# Patient Record
Sex: Female | Born: 1935
Health system: Southern US, Community
[De-identification: ages and names within clinical notes are randomized; demographics above are authoritative.]

## PROBLEM LIST (undated history)

## (undated) DIAGNOSIS — R0600 Dyspnea, unspecified: Secondary | ICD-10-CM

## (undated) DIAGNOSIS — I499 Cardiac arrhythmia, unspecified: Secondary | ICD-10-CM

## (undated) DIAGNOSIS — Z9289 Personal history of other medical treatment: Secondary | ICD-10-CM

## (undated) DIAGNOSIS — B019 Varicella without complication: Secondary | ICD-10-CM

## (undated) DIAGNOSIS — J189 Pneumonia, unspecified organism: Secondary | ICD-10-CM

## (undated) DIAGNOSIS — E7800 Pure hypercholesterolemia, unspecified: Secondary | ICD-10-CM

## (undated) DIAGNOSIS — M1990 Unspecified osteoarthritis, unspecified site: Secondary | ICD-10-CM

## (undated) DIAGNOSIS — I10 Essential (primary) hypertension: Secondary | ICD-10-CM

## (undated) DIAGNOSIS — I739 Peripheral vascular disease, unspecified: Secondary | ICD-10-CM

## (undated) DIAGNOSIS — K219 Gastro-esophageal reflux disease without esophagitis: Secondary | ICD-10-CM

## (undated) DIAGNOSIS — T7840XA Allergy, unspecified, initial encounter: Secondary | ICD-10-CM

## (undated) DIAGNOSIS — J449 Chronic obstructive pulmonary disease, unspecified: Secondary | ICD-10-CM

## (undated) HISTORY — PX: EYE SURGERY: SHX253

## (undated) HISTORY — DX: Essential (primary) hypertension: I10

## (undated) HISTORY — PX: OTHER SURGICAL HISTORY: SHX169

## (undated) HISTORY — PX: BIOPSY THYROID: PRO38

## (undated) HISTORY — PX: CATARACT EXTRACTION W/ INTRAOCULAR LENS  IMPLANT, BILATERAL: SHX1307

## (undated) HISTORY — PX: VAGINAL HYSTERECTOMY: SUR661

## (undated) HISTORY — DX: Varicella without complication: B01.9

## (undated) HISTORY — DX: Unspecified osteoarthritis, unspecified site: M19.90

## (undated) HISTORY — PX: VEIN BYPASS SURGERY: SHX833

## (undated) HISTORY — DX: Allergy, unspecified, initial encounter: T78.40XA

---

## 2013-04-23 LAB — CBC AND DIFFERENTIAL
HCT: 40 % (ref 36–46)
HEMOGLOBIN: 13.3 g/dL (ref 12.0–16.0)
PLATELETS: 254 10*3/uL (ref 150–399)
WBC: 5.4 10*3/mL

## 2013-04-23 LAB — TSH: TSH: 4.63 u[IU]/mL (ref 0.41–5.90)

## 2013-04-23 LAB — HEPATIC FUNCTION PANEL
ALK PHOS: 66 U/L (ref 25–125)
ALT: 10 U/L (ref 7–35)
AST: 22 U/L (ref 13–35)
Bilirubin, Total: 0.5 mg/dL

## 2013-04-23 LAB — LIPID PANEL
CHOLESTEROL: 198 mg/dL (ref 0–200)
HDL: 62 mg/dL (ref 35–70)
LDL Cholesterol: 115 mg/dL
Triglycerides: 105 mg/dL (ref 40–160)

## 2013-04-23 LAB — BASIC METABOLIC PANEL
BUN: 13 mg/dL (ref 4–21)
Creatinine: 0.8 mg/dL (ref 0.5–1.1)
Glucose: 77 mg/dL
Potassium: 3.6 mmol/L (ref 3.4–5.3)
Sodium: 141 mmol/L (ref 137–147)

## 2015-12-10 DIAGNOSIS — Z9289 Personal history of other medical treatment: Secondary | ICD-10-CM

## 2015-12-10 HISTORY — PX: HEMATOMA EVACUATION: SHX5118

## 2015-12-10 HISTORY — DX: Personal history of other medical treatment: Z92.89

## 2015-12-20 DIAGNOSIS — I70213 Atherosclerosis of native arteries of extremities with intermittent claudication, bilateral legs: Secondary | ICD-10-CM | POA: Diagnosis not present

## 2015-12-20 DIAGNOSIS — Z01812 Encounter for preprocedural laboratory examination: Secondary | ICD-10-CM | POA: Diagnosis not present

## 2015-12-20 DIAGNOSIS — R0989 Other specified symptoms and signs involving the circulatory and respiratory systems: Secondary | ICD-10-CM | POA: Diagnosis not present

## 2015-12-20 DIAGNOSIS — Z87891 Personal history of nicotine dependence: Secondary | ICD-10-CM | POA: Diagnosis not present

## 2015-12-20 DIAGNOSIS — Z7901 Long term (current) use of anticoagulants: Secondary | ICD-10-CM | POA: Diagnosis not present

## 2015-12-22 DIAGNOSIS — I70213 Atherosclerosis of native arteries of extremities with intermittent claudication, bilateral legs: Secondary | ICD-10-CM | POA: Diagnosis not present

## 2015-12-22 DIAGNOSIS — I10 Essential (primary) hypertension: Secondary | ICD-10-CM | POA: Diagnosis not present

## 2015-12-22 DIAGNOSIS — J449 Chronic obstructive pulmonary disease, unspecified: Secondary | ICD-10-CM | POA: Diagnosis not present

## 2015-12-27 DIAGNOSIS — J449 Chronic obstructive pulmonary disease, unspecified: Secondary | ICD-10-CM | POA: Diagnosis not present

## 2015-12-27 DIAGNOSIS — J0100 Acute maxillary sinusitis, unspecified: Secondary | ICD-10-CM | POA: Diagnosis not present

## 2015-12-27 DIAGNOSIS — I739 Peripheral vascular disease, unspecified: Secondary | ICD-10-CM | POA: Diagnosis not present

## 2016-01-02 DIAGNOSIS — H353131 Nonexudative age-related macular degeneration, bilateral, early dry stage: Secondary | ICD-10-CM | POA: Diagnosis not present

## 2016-01-02 DIAGNOSIS — H25813 Combined forms of age-related cataract, bilateral: Secondary | ICD-10-CM | POA: Diagnosis not present

## 2016-01-07 DIAGNOSIS — I5032 Chronic diastolic (congestive) heart failure: Secondary | ICD-10-CM | POA: Diagnosis not present

## 2016-01-07 DIAGNOSIS — Z9981 Dependence on supplemental oxygen: Secondary | ICD-10-CM | POA: Diagnosis not present

## 2016-01-07 DIAGNOSIS — I110 Hypertensive heart disease with heart failure: Secondary | ICD-10-CM | POA: Diagnosis not present

## 2016-01-07 DIAGNOSIS — Z87891 Personal history of nicotine dependence: Secondary | ICD-10-CM | POA: Diagnosis not present

## 2016-01-07 DIAGNOSIS — J1000 Influenza due to other identified influenza virus with unspecified type of pneumonia: Secondary | ICD-10-CM | POA: Diagnosis not present

## 2016-01-07 DIAGNOSIS — I739 Peripheral vascular disease, unspecified: Secondary | ICD-10-CM | POA: Diagnosis not present

## 2016-01-07 DIAGNOSIS — N390 Urinary tract infection, site not specified: Secondary | ICD-10-CM | POA: Diagnosis not present

## 2016-01-07 DIAGNOSIS — J441 Chronic obstructive pulmonary disease with (acute) exacerbation: Secondary | ICD-10-CM | POA: Diagnosis not present

## 2016-01-10 DIAGNOSIS — M6281 Muscle weakness (generalized): Secondary | ICD-10-CM | POA: Diagnosis not present

## 2016-01-10 DIAGNOSIS — I5032 Chronic diastolic (congestive) heart failure: Secondary | ICD-10-CM | POA: Diagnosis present

## 2016-01-10 DIAGNOSIS — I10 Essential (primary) hypertension: Secondary | ICD-10-CM | POA: Diagnosis not present

## 2016-01-10 DIAGNOSIS — J09X1 Influenza due to identified novel influenza A virus with pneumonia: Secondary | ICD-10-CM | POA: Diagnosis not present

## 2016-01-10 DIAGNOSIS — J441 Chronic obstructive pulmonary disease with (acute) exacerbation: Secondary | ICD-10-CM | POA: Diagnosis present

## 2016-01-10 DIAGNOSIS — R488 Other symbolic dysfunctions: Secondary | ICD-10-CM | POA: Diagnosis not present

## 2016-01-10 DIAGNOSIS — J111 Influenza due to unidentified influenza virus with other respiratory manifestations: Secondary | ICD-10-CM | POA: Diagnosis not present

## 2016-01-10 DIAGNOSIS — Z9981 Dependence on supplemental oxygen: Secondary | ICD-10-CM | POA: Diagnosis not present

## 2016-01-10 DIAGNOSIS — I5031 Acute diastolic (congestive) heart failure: Secondary | ICD-10-CM | POA: Diagnosis not present

## 2016-01-10 DIAGNOSIS — J189 Pneumonia, unspecified organism: Secondary | ICD-10-CM | POA: Diagnosis not present

## 2016-01-10 DIAGNOSIS — J449 Chronic obstructive pulmonary disease, unspecified: Secondary | ICD-10-CM | POA: Diagnosis not present

## 2016-01-10 DIAGNOSIS — I739 Peripheral vascular disease, unspecified: Secondary | ICD-10-CM | POA: Diagnosis present

## 2016-01-10 DIAGNOSIS — Z87891 Personal history of nicotine dependence: Secondary | ICD-10-CM | POA: Diagnosis not present

## 2016-01-10 DIAGNOSIS — R1312 Dysphagia, oropharyngeal phase: Secondary | ICD-10-CM | POA: Diagnosis not present

## 2016-01-10 DIAGNOSIS — J1000 Influenza due to other identified influenza virus with unspecified type of pneumonia: Secondary | ICD-10-CM | POA: Diagnosis present

## 2016-01-10 DIAGNOSIS — R2681 Unsteadiness on feet: Secondary | ICD-10-CM | POA: Diagnosis not present

## 2016-01-10 DIAGNOSIS — J40 Bronchitis, not specified as acute or chronic: Secondary | ICD-10-CM | POA: Diagnosis not present

## 2016-01-10 DIAGNOSIS — I110 Hypertensive heart disease with heart failure: Secondary | ICD-10-CM | POA: Diagnosis present

## 2016-01-10 DIAGNOSIS — R0602 Shortness of breath: Secondary | ICD-10-CM | POA: Diagnosis not present

## 2016-01-10 DIAGNOSIS — R41841 Cognitive communication deficit: Secondary | ICD-10-CM | POA: Diagnosis not present

## 2016-01-13 DIAGNOSIS — M6281 Muscle weakness (generalized): Secondary | ICD-10-CM | POA: Diagnosis not present

## 2016-01-13 DIAGNOSIS — R0602 Shortness of breath: Secondary | ICD-10-CM | POA: Diagnosis not present

## 2016-01-13 DIAGNOSIS — R41841 Cognitive communication deficit: Secondary | ICD-10-CM | POA: Diagnosis not present

## 2016-01-13 DIAGNOSIS — R1312 Dysphagia, oropharyngeal phase: Secondary | ICD-10-CM | POA: Diagnosis not present

## 2016-01-13 DIAGNOSIS — R2681 Unsteadiness on feet: Secondary | ICD-10-CM | POA: Diagnosis not present

## 2016-01-13 DIAGNOSIS — I10 Essential (primary) hypertension: Secondary | ICD-10-CM | POA: Diagnosis not present

## 2016-01-13 DIAGNOSIS — R488 Other symbolic dysfunctions: Secondary | ICD-10-CM | POA: Diagnosis not present

## 2016-01-13 DIAGNOSIS — J189 Pneumonia, unspecified organism: Secondary | ICD-10-CM | POA: Diagnosis not present

## 2016-01-13 DIAGNOSIS — I70211 Atherosclerosis of native arteries of extremities with intermittent claudication, right leg: Secondary | ICD-10-CM | POA: Diagnosis not present

## 2016-01-13 DIAGNOSIS — J449 Chronic obstructive pulmonary disease, unspecified: Secondary | ICD-10-CM | POA: Diagnosis not present

## 2016-01-13 DIAGNOSIS — J441 Chronic obstructive pulmonary disease with (acute) exacerbation: Secondary | ICD-10-CM | POA: Diagnosis not present

## 2016-01-13 DIAGNOSIS — I5031 Acute diastolic (congestive) heart failure: Secondary | ICD-10-CM | POA: Diagnosis not present

## 2016-01-13 DIAGNOSIS — J09X1 Influenza due to identified novel influenza A virus with pneumonia: Secondary | ICD-10-CM | POA: Diagnosis not present

## 2016-01-13 DIAGNOSIS — I739 Peripheral vascular disease, unspecified: Secondary | ICD-10-CM | POA: Diagnosis not present

## 2016-01-29 DIAGNOSIS — I70211 Atherosclerosis of native arteries of extremities with intermittent claudication, right leg: Secondary | ICD-10-CM | POA: Diagnosis not present

## 2016-02-02 DIAGNOSIS — D62 Acute posthemorrhagic anemia: Secondary | ICD-10-CM | POA: Diagnosis not present

## 2016-02-02 DIAGNOSIS — I70212 Atherosclerosis of native arteries of extremities with intermittent claudication, left leg: Secondary | ICD-10-CM | POA: Diagnosis not present

## 2016-02-02 DIAGNOSIS — I7091 Generalized atherosclerosis: Secondary | ICD-10-CM | POA: Diagnosis not present

## 2016-02-02 DIAGNOSIS — I70291 Other atherosclerosis of native arteries of extremities, right leg: Secondary | ICD-10-CM | POA: Diagnosis not present

## 2016-02-02 DIAGNOSIS — D649 Anemia, unspecified: Secondary | ICD-10-CM | POA: Diagnosis not present

## 2016-02-02 DIAGNOSIS — I739 Peripheral vascular disease, unspecified: Secondary | ICD-10-CM | POA: Diagnosis not present

## 2016-02-02 DIAGNOSIS — I998 Other disorder of circulatory system: Secondary | ICD-10-CM | POA: Diagnosis not present

## 2016-02-02 DIAGNOSIS — I4891 Unspecified atrial fibrillation: Secondary | ICD-10-CM | POA: Diagnosis not present

## 2016-02-02 DIAGNOSIS — D72829 Elevated white blood cell count, unspecified: Secondary | ICD-10-CM | POA: Diagnosis not present

## 2016-02-02 DIAGNOSIS — I70221 Atherosclerosis of native arteries of extremities with rest pain, right leg: Secondary | ICD-10-CM | POA: Diagnosis not present

## 2016-02-02 DIAGNOSIS — T82868A Thrombosis of vascular prosthetic devices, implants and grafts, initial encounter: Secondary | ICD-10-CM | POA: Diagnosis present

## 2016-02-02 DIAGNOSIS — J449 Chronic obstructive pulmonary disease, unspecified: Secondary | ICD-10-CM | POA: Diagnosis not present

## 2016-02-02 DIAGNOSIS — I700 Atherosclerosis of aorta: Secondary | ICD-10-CM | POA: Diagnosis not present

## 2016-02-02 DIAGNOSIS — I272 Other secondary pulmonary hypertension: Secondary | ICD-10-CM | POA: Diagnosis not present

## 2016-02-02 DIAGNOSIS — I70211 Atherosclerosis of native arteries of extremities with intermittent claudication, right leg: Secondary | ICD-10-CM | POA: Diagnosis not present

## 2016-02-02 DIAGNOSIS — Z87891 Personal history of nicotine dependence: Secondary | ICD-10-CM | POA: Diagnosis not present

## 2016-02-02 DIAGNOSIS — I071 Rheumatic tricuspid insufficiency: Secondary | ICD-10-CM | POA: Diagnosis not present

## 2016-02-02 DIAGNOSIS — I10 Essential (primary) hypertension: Secondary | ICD-10-CM | POA: Diagnosis not present

## 2016-02-02 DIAGNOSIS — R339 Retention of urine, unspecified: Secondary | ICD-10-CM | POA: Diagnosis not present

## 2016-02-02 DIAGNOSIS — I9752 Accidental puncture and laceration of a circulatory system organ or structure during other procedure: Secondary | ICD-10-CM | POA: Diagnosis present

## 2016-02-02 DIAGNOSIS — T82898A Other specified complication of vascular prosthetic devices, implants and grafts, initial encounter: Secondary | ICD-10-CM | POA: Diagnosis not present

## 2016-02-09 DIAGNOSIS — Z9981 Dependence on supplemental oxygen: Secondary | ICD-10-CM | POA: Diagnosis not present

## 2016-02-09 DIAGNOSIS — I739 Peripheral vascular disease, unspecified: Secondary | ICD-10-CM | POA: Diagnosis not present

## 2016-02-09 DIAGNOSIS — Z48812 Encounter for surgical aftercare following surgery on the circulatory system: Secondary | ICD-10-CM | POA: Diagnosis not present

## 2016-02-09 DIAGNOSIS — I10 Essential (primary) hypertension: Secondary | ICD-10-CM | POA: Diagnosis not present

## 2016-02-09 DIAGNOSIS — F17201 Nicotine dependence, unspecified, in remission: Secondary | ICD-10-CM | POA: Diagnosis not present

## 2016-02-09 DIAGNOSIS — J449 Chronic obstructive pulmonary disease, unspecified: Secondary | ICD-10-CM | POA: Diagnosis not present

## 2016-02-09 DIAGNOSIS — I4891 Unspecified atrial fibrillation: Secondary | ICD-10-CM | POA: Diagnosis not present

## 2016-02-09 DIAGNOSIS — R1310 Dysphagia, unspecified: Secondary | ICD-10-CM | POA: Diagnosis not present

## 2016-02-12 DIAGNOSIS — I739 Peripheral vascular disease, unspecified: Secondary | ICD-10-CM | POA: Diagnosis not present

## 2016-02-12 DIAGNOSIS — I70211 Atherosclerosis of native arteries of extremities with intermittent claudication, right leg: Secondary | ICD-10-CM | POA: Diagnosis not present

## 2016-02-12 DIAGNOSIS — I4891 Unspecified atrial fibrillation: Secondary | ICD-10-CM | POA: Diagnosis not present

## 2016-02-12 DIAGNOSIS — Z48812 Encounter for surgical aftercare following surgery on the circulatory system: Secondary | ICD-10-CM | POA: Diagnosis not present

## 2016-02-12 DIAGNOSIS — I10 Essential (primary) hypertension: Secondary | ICD-10-CM | POA: Diagnosis not present

## 2016-02-12 DIAGNOSIS — J449 Chronic obstructive pulmonary disease, unspecified: Secondary | ICD-10-CM | POA: Diagnosis not present

## 2016-02-12 DIAGNOSIS — R1310 Dysphagia, unspecified: Secondary | ICD-10-CM | POA: Diagnosis not present

## 2016-02-13 DIAGNOSIS — R1310 Dysphagia, unspecified: Secondary | ICD-10-CM | POA: Diagnosis not present

## 2016-02-13 DIAGNOSIS — I4891 Unspecified atrial fibrillation: Secondary | ICD-10-CM | POA: Diagnosis not present

## 2016-02-13 DIAGNOSIS — I10 Essential (primary) hypertension: Secondary | ICD-10-CM | POA: Diagnosis not present

## 2016-02-13 DIAGNOSIS — Z48812 Encounter for surgical aftercare following surgery on the circulatory system: Secondary | ICD-10-CM | POA: Diagnosis not present

## 2016-02-13 DIAGNOSIS — J449 Chronic obstructive pulmonary disease, unspecified: Secondary | ICD-10-CM | POA: Diagnosis not present

## 2016-02-13 DIAGNOSIS — I739 Peripheral vascular disease, unspecified: Secondary | ICD-10-CM | POA: Diagnosis not present

## 2016-02-14 DIAGNOSIS — H25813 Combined forms of age-related cataract, bilateral: Secondary | ICD-10-CM | POA: Diagnosis not present

## 2016-02-15 DIAGNOSIS — J449 Chronic obstructive pulmonary disease, unspecified: Secondary | ICD-10-CM | POA: Diagnosis not present

## 2016-02-15 DIAGNOSIS — I4891 Unspecified atrial fibrillation: Secondary | ICD-10-CM | POA: Diagnosis not present

## 2016-02-15 DIAGNOSIS — I739 Peripheral vascular disease, unspecified: Secondary | ICD-10-CM | POA: Diagnosis not present

## 2016-02-15 DIAGNOSIS — Z48812 Encounter for surgical aftercare following surgery on the circulatory system: Secondary | ICD-10-CM | POA: Diagnosis not present

## 2016-02-15 DIAGNOSIS — I10 Essential (primary) hypertension: Secondary | ICD-10-CM | POA: Diagnosis not present

## 2016-02-15 DIAGNOSIS — R1310 Dysphagia, unspecified: Secondary | ICD-10-CM | POA: Diagnosis not present

## 2016-02-19 DIAGNOSIS — I10 Essential (primary) hypertension: Secondary | ICD-10-CM | POA: Diagnosis not present

## 2016-02-19 DIAGNOSIS — R1310 Dysphagia, unspecified: Secondary | ICD-10-CM | POA: Diagnosis not present

## 2016-02-19 DIAGNOSIS — I739 Peripheral vascular disease, unspecified: Secondary | ICD-10-CM | POA: Diagnosis not present

## 2016-02-19 DIAGNOSIS — J449 Chronic obstructive pulmonary disease, unspecified: Secondary | ICD-10-CM | POA: Diagnosis not present

## 2016-02-19 DIAGNOSIS — I4891 Unspecified atrial fibrillation: Secondary | ICD-10-CM | POA: Diagnosis not present

## 2016-02-19 DIAGNOSIS — Z48812 Encounter for surgical aftercare following surgery on the circulatory system: Secondary | ICD-10-CM | POA: Diagnosis not present

## 2016-02-20 DIAGNOSIS — R1310 Dysphagia, unspecified: Secondary | ICD-10-CM | POA: Diagnosis not present

## 2016-02-20 DIAGNOSIS — I10 Essential (primary) hypertension: Secondary | ICD-10-CM | POA: Diagnosis not present

## 2016-02-20 DIAGNOSIS — I4891 Unspecified atrial fibrillation: Secondary | ICD-10-CM | POA: Diagnosis not present

## 2016-02-20 DIAGNOSIS — I739 Peripheral vascular disease, unspecified: Secondary | ICD-10-CM | POA: Diagnosis not present

## 2016-02-20 DIAGNOSIS — Z48812 Encounter for surgical aftercare following surgery on the circulatory system: Secondary | ICD-10-CM | POA: Diagnosis not present

## 2016-02-20 DIAGNOSIS — J449 Chronic obstructive pulmonary disease, unspecified: Secondary | ICD-10-CM | POA: Diagnosis not present

## 2016-02-21 DIAGNOSIS — R1310 Dysphagia, unspecified: Secondary | ICD-10-CM | POA: Diagnosis not present

## 2016-02-21 DIAGNOSIS — I10 Essential (primary) hypertension: Secondary | ICD-10-CM | POA: Diagnosis not present

## 2016-02-21 DIAGNOSIS — Z48812 Encounter for surgical aftercare following surgery on the circulatory system: Secondary | ICD-10-CM | POA: Diagnosis not present

## 2016-02-21 DIAGNOSIS — I4891 Unspecified atrial fibrillation: Secondary | ICD-10-CM | POA: Diagnosis not present

## 2016-02-21 DIAGNOSIS — J449 Chronic obstructive pulmonary disease, unspecified: Secondary | ICD-10-CM | POA: Diagnosis not present

## 2016-02-21 DIAGNOSIS — I739 Peripheral vascular disease, unspecified: Secondary | ICD-10-CM | POA: Diagnosis not present

## 2016-02-22 DIAGNOSIS — I739 Peripheral vascular disease, unspecified: Secondary | ICD-10-CM | POA: Diagnosis not present

## 2016-02-22 DIAGNOSIS — I4891 Unspecified atrial fibrillation: Secondary | ICD-10-CM | POA: Diagnosis not present

## 2016-02-22 DIAGNOSIS — R1310 Dysphagia, unspecified: Secondary | ICD-10-CM | POA: Diagnosis not present

## 2016-02-22 DIAGNOSIS — J449 Chronic obstructive pulmonary disease, unspecified: Secondary | ICD-10-CM | POA: Diagnosis not present

## 2016-02-22 DIAGNOSIS — Z48812 Encounter for surgical aftercare following surgery on the circulatory system: Secondary | ICD-10-CM | POA: Diagnosis not present

## 2016-02-22 DIAGNOSIS — I10 Essential (primary) hypertension: Secondary | ICD-10-CM | POA: Diagnosis not present

## 2016-02-23 DIAGNOSIS — J449 Chronic obstructive pulmonary disease, unspecified: Secondary | ICD-10-CM | POA: Diagnosis not present

## 2016-02-23 DIAGNOSIS — I739 Peripheral vascular disease, unspecified: Secondary | ICD-10-CM | POA: Diagnosis not present

## 2016-02-23 DIAGNOSIS — I4891 Unspecified atrial fibrillation: Secondary | ICD-10-CM | POA: Diagnosis not present

## 2016-02-23 DIAGNOSIS — Z48812 Encounter for surgical aftercare following surgery on the circulatory system: Secondary | ICD-10-CM | POA: Diagnosis not present

## 2016-02-23 DIAGNOSIS — R1310 Dysphagia, unspecified: Secondary | ICD-10-CM | POA: Diagnosis not present

## 2016-02-23 DIAGNOSIS — I10 Essential (primary) hypertension: Secondary | ICD-10-CM | POA: Diagnosis not present

## 2016-02-23 DIAGNOSIS — J0100 Acute maxillary sinusitis, unspecified: Secondary | ICD-10-CM | POA: Diagnosis not present

## 2016-02-27 DIAGNOSIS — I10 Essential (primary) hypertension: Secondary | ICD-10-CM | POA: Diagnosis not present

## 2016-02-27 DIAGNOSIS — I4891 Unspecified atrial fibrillation: Secondary | ICD-10-CM | POA: Diagnosis not present

## 2016-02-27 DIAGNOSIS — Z48812 Encounter for surgical aftercare following surgery on the circulatory system: Secondary | ICD-10-CM | POA: Diagnosis not present

## 2016-02-27 DIAGNOSIS — J449 Chronic obstructive pulmonary disease, unspecified: Secondary | ICD-10-CM | POA: Diagnosis not present

## 2016-02-27 DIAGNOSIS — R1310 Dysphagia, unspecified: Secondary | ICD-10-CM | POA: Diagnosis not present

## 2016-02-27 DIAGNOSIS — I739 Peripheral vascular disease, unspecified: Secondary | ICD-10-CM | POA: Diagnosis not present

## 2016-02-29 DIAGNOSIS — I10 Essential (primary) hypertension: Secondary | ICD-10-CM | POA: Diagnosis not present

## 2016-02-29 DIAGNOSIS — Z48812 Encounter for surgical aftercare following surgery on the circulatory system: Secondary | ICD-10-CM | POA: Diagnosis not present

## 2016-02-29 DIAGNOSIS — R1310 Dysphagia, unspecified: Secondary | ICD-10-CM | POA: Diagnosis not present

## 2016-02-29 DIAGNOSIS — I4891 Unspecified atrial fibrillation: Secondary | ICD-10-CM | POA: Diagnosis not present

## 2016-02-29 DIAGNOSIS — I739 Peripheral vascular disease, unspecified: Secondary | ICD-10-CM | POA: Diagnosis not present

## 2016-02-29 DIAGNOSIS — J449 Chronic obstructive pulmonary disease, unspecified: Secondary | ICD-10-CM | POA: Diagnosis not present

## 2016-03-01 DIAGNOSIS — I70221 Atherosclerosis of native arteries of extremities with rest pain, right leg: Secondary | ICD-10-CM | POA: Diagnosis not present

## 2016-03-01 DIAGNOSIS — Z87891 Personal history of nicotine dependence: Secondary | ICD-10-CM | POA: Diagnosis not present

## 2016-03-26 DIAGNOSIS — Z7901 Long term (current) use of anticoagulants: Secondary | ICD-10-CM | POA: Diagnosis not present

## 2016-03-26 DIAGNOSIS — Z01812 Encounter for preprocedural laboratory examination: Secondary | ICD-10-CM | POA: Diagnosis not present

## 2016-03-26 DIAGNOSIS — I739 Peripheral vascular disease, unspecified: Secondary | ICD-10-CM | POA: Diagnosis not present

## 2016-04-05 DIAGNOSIS — J449 Chronic obstructive pulmonary disease, unspecified: Secondary | ICD-10-CM | POA: Diagnosis not present

## 2016-04-05 DIAGNOSIS — I70213 Atherosclerosis of native arteries of extremities with intermittent claudication, bilateral legs: Secondary | ICD-10-CM | POA: Diagnosis not present

## 2016-04-11 DIAGNOSIS — J0100 Acute maxillary sinusitis, unspecified: Secondary | ICD-10-CM | POA: Diagnosis not present

## 2016-04-11 DIAGNOSIS — I70223 Atherosclerosis of native arteries of extremities with rest pain, bilateral legs: Secondary | ICD-10-CM | POA: Diagnosis not present

## 2016-04-11 DIAGNOSIS — Z87891 Personal history of nicotine dependence: Secondary | ICD-10-CM | POA: Diagnosis not present

## 2016-04-16 DIAGNOSIS — I70213 Atherosclerosis of native arteries of extremities with intermittent claudication, bilateral legs: Secondary | ICD-10-CM | POA: Diagnosis not present

## 2016-04-16 DIAGNOSIS — Z7982 Long term (current) use of aspirin: Secondary | ICD-10-CM | POA: Diagnosis not present

## 2016-04-16 DIAGNOSIS — Z7901 Long term (current) use of anticoagulants: Secondary | ICD-10-CM | POA: Diagnosis not present

## 2016-04-26 DIAGNOSIS — I70203 Unspecified atherosclerosis of native arteries of extremities, bilateral legs: Secondary | ICD-10-CM | POA: Diagnosis not present

## 2016-04-26 DIAGNOSIS — R262 Difficulty in walking, not elsewhere classified: Secondary | ICD-10-CM | POA: Diagnosis not present

## 2016-04-26 DIAGNOSIS — B353 Tinea pedis: Secondary | ICD-10-CM | POA: Diagnosis not present

## 2016-04-26 DIAGNOSIS — B351 Tinea unguium: Secondary | ICD-10-CM | POA: Diagnosis not present

## 2016-05-03 DIAGNOSIS — I70213 Atherosclerosis of native arteries of extremities with intermittent claudication, bilateral legs: Secondary | ICD-10-CM | POA: Diagnosis not present

## 2016-05-03 DIAGNOSIS — I739 Peripheral vascular disease, unspecified: Secondary | ICD-10-CM | POA: Diagnosis not present

## 2016-05-03 DIAGNOSIS — I10 Essential (primary) hypertension: Secondary | ICD-10-CM | POA: Diagnosis not present

## 2016-05-16 DIAGNOSIS — J449 Chronic obstructive pulmonary disease, unspecified: Secondary | ICD-10-CM | POA: Diagnosis not present

## 2016-05-17 DIAGNOSIS — Z87891 Personal history of nicotine dependence: Secondary | ICD-10-CM | POA: Diagnosis not present

## 2016-05-17 DIAGNOSIS — I7092 Chronic total occlusion of artery of the extremities: Secondary | ICD-10-CM | POA: Diagnosis not present

## 2016-05-17 DIAGNOSIS — Z01818 Encounter for other preprocedural examination: Secondary | ICD-10-CM | POA: Diagnosis not present

## 2016-05-17 DIAGNOSIS — I10 Essential (primary) hypertension: Secondary | ICD-10-CM | POA: Diagnosis not present

## 2016-05-17 DIAGNOSIS — I70221 Atherosclerosis of native arteries of extremities with rest pain, right leg: Secondary | ICD-10-CM | POA: Diagnosis not present

## 2016-05-30 DIAGNOSIS — I739 Peripheral vascular disease, unspecified: Secondary | ICD-10-CM | POA: Diagnosis not present

## 2016-05-30 DIAGNOSIS — I70222 Atherosclerosis of native arteries of extremities with rest pain, left leg: Secondary | ICD-10-CM | POA: Diagnosis not present

## 2016-05-30 DIAGNOSIS — I998 Other disorder of circulatory system: Secondary | ICD-10-CM | POA: Diagnosis present

## 2016-05-30 DIAGNOSIS — Z87891 Personal history of nicotine dependence: Secondary | ICD-10-CM | POA: Diagnosis not present

## 2016-05-30 DIAGNOSIS — I10 Essential (primary) hypertension: Secondary | ICD-10-CM | POA: Diagnosis not present

## 2016-05-30 DIAGNOSIS — I70221 Atherosclerosis of native arteries of extremities with rest pain, right leg: Secondary | ICD-10-CM | POA: Diagnosis not present

## 2016-05-30 DIAGNOSIS — J45909 Unspecified asthma, uncomplicated: Secondary | ICD-10-CM | POA: Diagnosis present

## 2016-06-04 DIAGNOSIS — Z48812 Encounter for surgical aftercare following surgery on the circulatory system: Secondary | ICD-10-CM | POA: Diagnosis not present

## 2016-06-04 DIAGNOSIS — I10 Essential (primary) hypertension: Secondary | ICD-10-CM | POA: Diagnosis not present

## 2016-06-04 DIAGNOSIS — I70221 Atherosclerosis of native arteries of extremities with rest pain, right leg: Secondary | ICD-10-CM | POA: Diagnosis not present

## 2016-06-04 DIAGNOSIS — Z9981 Dependence on supplemental oxygen: Secondary | ICD-10-CM | POA: Diagnosis not present

## 2016-06-04 DIAGNOSIS — F17201 Nicotine dependence, unspecified, in remission: Secondary | ICD-10-CM | POA: Diagnosis not present

## 2016-06-05 DIAGNOSIS — I70221 Atherosclerosis of native arteries of extremities with rest pain, right leg: Secondary | ICD-10-CM | POA: Diagnosis not present

## 2016-06-05 DIAGNOSIS — Z48812 Encounter for surgical aftercare following surgery on the circulatory system: Secondary | ICD-10-CM | POA: Diagnosis not present

## 2016-06-05 DIAGNOSIS — F17201 Nicotine dependence, unspecified, in remission: Secondary | ICD-10-CM | POA: Diagnosis not present

## 2016-06-05 DIAGNOSIS — I10 Essential (primary) hypertension: Secondary | ICD-10-CM | POA: Diagnosis not present

## 2016-06-05 DIAGNOSIS — Z9981 Dependence on supplemental oxygen: Secondary | ICD-10-CM | POA: Diagnosis not present

## 2016-06-07 DIAGNOSIS — Z9981 Dependence on supplemental oxygen: Secondary | ICD-10-CM | POA: Diagnosis not present

## 2016-06-07 DIAGNOSIS — Z48812 Encounter for surgical aftercare following surgery on the circulatory system: Secondary | ICD-10-CM | POA: Diagnosis not present

## 2016-06-07 DIAGNOSIS — F17201 Nicotine dependence, unspecified, in remission: Secondary | ICD-10-CM | POA: Diagnosis not present

## 2016-06-07 DIAGNOSIS — I70221 Atherosclerosis of native arteries of extremities with rest pain, right leg: Secondary | ICD-10-CM | POA: Diagnosis not present

## 2016-06-07 DIAGNOSIS — I10 Essential (primary) hypertension: Secondary | ICD-10-CM | POA: Diagnosis not present

## 2016-06-10 DIAGNOSIS — Z48812 Encounter for surgical aftercare following surgery on the circulatory system: Secondary | ICD-10-CM | POA: Diagnosis not present

## 2016-06-10 DIAGNOSIS — I10 Essential (primary) hypertension: Secondary | ICD-10-CM | POA: Diagnosis not present

## 2016-06-10 DIAGNOSIS — Z9981 Dependence on supplemental oxygen: Secondary | ICD-10-CM | POA: Diagnosis not present

## 2016-06-10 DIAGNOSIS — F17201 Nicotine dependence, unspecified, in remission: Secondary | ICD-10-CM | POA: Diagnosis not present

## 2016-06-10 DIAGNOSIS — I70221 Atherosclerosis of native arteries of extremities with rest pain, right leg: Secondary | ICD-10-CM | POA: Diagnosis not present

## 2016-06-12 DIAGNOSIS — Z48812 Encounter for surgical aftercare following surgery on the circulatory system: Secondary | ICD-10-CM | POA: Diagnosis not present

## 2016-06-12 DIAGNOSIS — Z9981 Dependence on supplemental oxygen: Secondary | ICD-10-CM | POA: Diagnosis not present

## 2016-06-12 DIAGNOSIS — I10 Essential (primary) hypertension: Secondary | ICD-10-CM | POA: Diagnosis not present

## 2016-06-12 DIAGNOSIS — I70221 Atherosclerosis of native arteries of extremities with rest pain, right leg: Secondary | ICD-10-CM | POA: Diagnosis not present

## 2016-06-12 DIAGNOSIS — F17201 Nicotine dependence, unspecified, in remission: Secondary | ICD-10-CM | POA: Diagnosis not present

## 2016-06-13 DIAGNOSIS — I70221 Atherosclerosis of native arteries of extremities with rest pain, right leg: Secondary | ICD-10-CM | POA: Diagnosis not present

## 2016-06-13 DIAGNOSIS — F17201 Nicotine dependence, unspecified, in remission: Secondary | ICD-10-CM | POA: Diagnosis not present

## 2016-06-13 DIAGNOSIS — Z9981 Dependence on supplemental oxygen: Secondary | ICD-10-CM | POA: Diagnosis not present

## 2016-06-13 DIAGNOSIS — Z48812 Encounter for surgical aftercare following surgery on the circulatory system: Secondary | ICD-10-CM | POA: Diagnosis not present

## 2016-06-13 DIAGNOSIS — I10 Essential (primary) hypertension: Secondary | ICD-10-CM | POA: Diagnosis not present

## 2016-06-17 DIAGNOSIS — F17201 Nicotine dependence, unspecified, in remission: Secondary | ICD-10-CM | POA: Diagnosis not present

## 2016-06-17 DIAGNOSIS — Z48812 Encounter for surgical aftercare following surgery on the circulatory system: Secondary | ICD-10-CM | POA: Diagnosis not present

## 2016-06-17 DIAGNOSIS — I70221 Atherosclerosis of native arteries of extremities with rest pain, right leg: Secondary | ICD-10-CM | POA: Diagnosis not present

## 2016-06-17 DIAGNOSIS — I10 Essential (primary) hypertension: Secondary | ICD-10-CM | POA: Diagnosis not present

## 2016-06-17 DIAGNOSIS — Z9981 Dependence on supplemental oxygen: Secondary | ICD-10-CM | POA: Diagnosis not present

## 2016-06-18 DIAGNOSIS — I10 Essential (primary) hypertension: Secondary | ICD-10-CM | POA: Diagnosis not present

## 2016-06-18 DIAGNOSIS — I70221 Atherosclerosis of native arteries of extremities with rest pain, right leg: Secondary | ICD-10-CM | POA: Diagnosis not present

## 2016-06-18 DIAGNOSIS — Z9981 Dependence on supplemental oxygen: Secondary | ICD-10-CM | POA: Diagnosis not present

## 2016-06-18 DIAGNOSIS — F17201 Nicotine dependence, unspecified, in remission: Secondary | ICD-10-CM | POA: Diagnosis not present

## 2016-06-18 DIAGNOSIS — Z48812 Encounter for surgical aftercare following surgery on the circulatory system: Secondary | ICD-10-CM | POA: Diagnosis not present

## 2016-06-21 DIAGNOSIS — Z9981 Dependence on supplemental oxygen: Secondary | ICD-10-CM | POA: Diagnosis not present

## 2016-06-21 DIAGNOSIS — Z48812 Encounter for surgical aftercare following surgery on the circulatory system: Secondary | ICD-10-CM | POA: Diagnosis not present

## 2016-06-21 DIAGNOSIS — I70221 Atherosclerosis of native arteries of extremities with rest pain, right leg: Secondary | ICD-10-CM | POA: Diagnosis not present

## 2016-06-21 DIAGNOSIS — F17201 Nicotine dependence, unspecified, in remission: Secondary | ICD-10-CM | POA: Diagnosis not present

## 2016-06-21 DIAGNOSIS — I10 Essential (primary) hypertension: Secondary | ICD-10-CM | POA: Diagnosis not present

## 2016-06-22 DIAGNOSIS — I10 Essential (primary) hypertension: Secondary | ICD-10-CM | POA: Diagnosis not present

## 2016-06-22 DIAGNOSIS — Z9981 Dependence on supplemental oxygen: Secondary | ICD-10-CM | POA: Diagnosis not present

## 2016-06-22 DIAGNOSIS — F17201 Nicotine dependence, unspecified, in remission: Secondary | ICD-10-CM | POA: Diagnosis not present

## 2016-06-22 DIAGNOSIS — Z48812 Encounter for surgical aftercare following surgery on the circulatory system: Secondary | ICD-10-CM | POA: Diagnosis not present

## 2016-06-22 DIAGNOSIS — I70221 Atherosclerosis of native arteries of extremities with rest pain, right leg: Secondary | ICD-10-CM | POA: Diagnosis not present

## 2016-06-24 DIAGNOSIS — I70221 Atherosclerosis of native arteries of extremities with rest pain, right leg: Secondary | ICD-10-CM | POA: Diagnosis not present

## 2016-06-24 DIAGNOSIS — I10 Essential (primary) hypertension: Secondary | ICD-10-CM | POA: Diagnosis not present

## 2016-06-24 DIAGNOSIS — Z9981 Dependence on supplemental oxygen: Secondary | ICD-10-CM | POA: Diagnosis not present

## 2016-06-24 DIAGNOSIS — F17201 Nicotine dependence, unspecified, in remission: Secondary | ICD-10-CM | POA: Diagnosis not present

## 2016-06-24 DIAGNOSIS — Z48812 Encounter for surgical aftercare following surgery on the circulatory system: Secondary | ICD-10-CM | POA: Diagnosis not present

## 2016-06-25 DIAGNOSIS — I10 Essential (primary) hypertension: Secondary | ICD-10-CM | POA: Diagnosis not present

## 2016-06-25 DIAGNOSIS — Z48812 Encounter for surgical aftercare following surgery on the circulatory system: Secondary | ICD-10-CM | POA: Diagnosis not present

## 2016-06-25 DIAGNOSIS — Z9981 Dependence on supplemental oxygen: Secondary | ICD-10-CM | POA: Diagnosis not present

## 2016-06-25 DIAGNOSIS — F17201 Nicotine dependence, unspecified, in remission: Secondary | ICD-10-CM | POA: Diagnosis not present

## 2016-06-25 DIAGNOSIS — I70221 Atherosclerosis of native arteries of extremities with rest pain, right leg: Secondary | ICD-10-CM | POA: Diagnosis not present

## 2016-06-26 DIAGNOSIS — Z9981 Dependence on supplemental oxygen: Secondary | ICD-10-CM | POA: Diagnosis not present

## 2016-06-26 DIAGNOSIS — F17201 Nicotine dependence, unspecified, in remission: Secondary | ICD-10-CM | POA: Diagnosis not present

## 2016-06-26 DIAGNOSIS — Z48812 Encounter for surgical aftercare following surgery on the circulatory system: Secondary | ICD-10-CM | POA: Diagnosis not present

## 2016-06-26 DIAGNOSIS — I70221 Atherosclerosis of native arteries of extremities with rest pain, right leg: Secondary | ICD-10-CM | POA: Diagnosis not present

## 2016-06-26 DIAGNOSIS — I10 Essential (primary) hypertension: Secondary | ICD-10-CM | POA: Diagnosis not present

## 2016-06-27 DIAGNOSIS — Z48812 Encounter for surgical aftercare following surgery on the circulatory system: Secondary | ICD-10-CM | POA: Diagnosis not present

## 2016-06-27 DIAGNOSIS — I70221 Atherosclerosis of native arteries of extremities with rest pain, right leg: Secondary | ICD-10-CM | POA: Diagnosis not present

## 2016-06-27 DIAGNOSIS — I10 Essential (primary) hypertension: Secondary | ICD-10-CM | POA: Diagnosis not present

## 2016-06-27 DIAGNOSIS — Z9981 Dependence on supplemental oxygen: Secondary | ICD-10-CM | POA: Diagnosis not present

## 2016-06-27 DIAGNOSIS — F17201 Nicotine dependence, unspecified, in remission: Secondary | ICD-10-CM | POA: Diagnosis not present

## 2016-06-28 DIAGNOSIS — F17201 Nicotine dependence, unspecified, in remission: Secondary | ICD-10-CM | POA: Diagnosis not present

## 2016-06-28 DIAGNOSIS — Z9981 Dependence on supplemental oxygen: Secondary | ICD-10-CM | POA: Diagnosis not present

## 2016-06-28 DIAGNOSIS — I10 Essential (primary) hypertension: Secondary | ICD-10-CM | POA: Diagnosis not present

## 2016-06-28 DIAGNOSIS — Z48812 Encounter for surgical aftercare following surgery on the circulatory system: Secondary | ICD-10-CM | POA: Diagnosis not present

## 2016-06-28 DIAGNOSIS — I70221 Atherosclerosis of native arteries of extremities with rest pain, right leg: Secondary | ICD-10-CM | POA: Diagnosis not present

## 2016-07-01 DIAGNOSIS — F17201 Nicotine dependence, unspecified, in remission: Secondary | ICD-10-CM | POA: Diagnosis not present

## 2016-07-01 DIAGNOSIS — Z48812 Encounter for surgical aftercare following surgery on the circulatory system: Secondary | ICD-10-CM | POA: Diagnosis not present

## 2016-07-01 DIAGNOSIS — I70221 Atherosclerosis of native arteries of extremities with rest pain, right leg: Secondary | ICD-10-CM | POA: Diagnosis not present

## 2016-07-01 DIAGNOSIS — Z9981 Dependence on supplemental oxygen: Secondary | ICD-10-CM | POA: Diagnosis not present

## 2016-07-01 DIAGNOSIS — I10 Essential (primary) hypertension: Secondary | ICD-10-CM | POA: Diagnosis not present

## 2016-07-02 ENCOUNTER — Ambulatory Visit: Payer: Self-pay | Admitting: Family Medicine

## 2016-07-02 DIAGNOSIS — I70221 Atherosclerosis of native arteries of extremities with rest pain, right leg: Secondary | ICD-10-CM | POA: Diagnosis not present

## 2016-07-02 DIAGNOSIS — Z48812 Encounter for surgical aftercare following surgery on the circulatory system: Secondary | ICD-10-CM | POA: Diagnosis not present

## 2016-07-02 DIAGNOSIS — I10 Essential (primary) hypertension: Secondary | ICD-10-CM | POA: Diagnosis not present

## 2016-07-02 DIAGNOSIS — Z9981 Dependence on supplemental oxygen: Secondary | ICD-10-CM | POA: Diagnosis not present

## 2016-07-02 DIAGNOSIS — F17201 Nicotine dependence, unspecified, in remission: Secondary | ICD-10-CM | POA: Diagnosis not present

## 2016-07-03 DIAGNOSIS — I70221 Atherosclerosis of native arteries of extremities with rest pain, right leg: Secondary | ICD-10-CM | POA: Diagnosis not present

## 2016-07-03 DIAGNOSIS — Z9981 Dependence on supplemental oxygen: Secondary | ICD-10-CM | POA: Diagnosis not present

## 2016-07-03 DIAGNOSIS — Z48812 Encounter for surgical aftercare following surgery on the circulatory system: Secondary | ICD-10-CM | POA: Diagnosis not present

## 2016-07-03 DIAGNOSIS — I10 Essential (primary) hypertension: Secondary | ICD-10-CM | POA: Diagnosis not present

## 2016-07-03 DIAGNOSIS — F17201 Nicotine dependence, unspecified, in remission: Secondary | ICD-10-CM | POA: Diagnosis not present

## 2016-07-04 DIAGNOSIS — Z9981 Dependence on supplemental oxygen: Secondary | ICD-10-CM | POA: Diagnosis not present

## 2016-07-04 DIAGNOSIS — Z48812 Encounter for surgical aftercare following surgery on the circulatory system: Secondary | ICD-10-CM | POA: Diagnosis not present

## 2016-07-04 DIAGNOSIS — I10 Essential (primary) hypertension: Secondary | ICD-10-CM | POA: Diagnosis not present

## 2016-07-04 DIAGNOSIS — I70221 Atherosclerosis of native arteries of extremities with rest pain, right leg: Secondary | ICD-10-CM | POA: Diagnosis not present

## 2016-07-04 DIAGNOSIS — F17201 Nicotine dependence, unspecified, in remission: Secondary | ICD-10-CM | POA: Diagnosis not present

## 2016-07-05 DIAGNOSIS — Z48812 Encounter for surgical aftercare following surgery on the circulatory system: Secondary | ICD-10-CM | POA: Diagnosis not present

## 2016-07-05 DIAGNOSIS — F17201 Nicotine dependence, unspecified, in remission: Secondary | ICD-10-CM | POA: Diagnosis not present

## 2016-07-05 DIAGNOSIS — I10 Essential (primary) hypertension: Secondary | ICD-10-CM | POA: Diagnosis not present

## 2016-07-05 DIAGNOSIS — I70221 Atherosclerosis of native arteries of extremities with rest pain, right leg: Secondary | ICD-10-CM | POA: Diagnosis not present

## 2016-07-05 DIAGNOSIS — Z9981 Dependence on supplemental oxygen: Secondary | ICD-10-CM | POA: Diagnosis not present

## 2016-07-09 DIAGNOSIS — I70221 Atherosclerosis of native arteries of extremities with rest pain, right leg: Secondary | ICD-10-CM | POA: Diagnosis not present

## 2016-07-09 DIAGNOSIS — F17201 Nicotine dependence, unspecified, in remission: Secondary | ICD-10-CM | POA: Diagnosis not present

## 2016-07-09 DIAGNOSIS — I10 Essential (primary) hypertension: Secondary | ICD-10-CM | POA: Diagnosis not present

## 2016-07-09 DIAGNOSIS — Z48812 Encounter for surgical aftercare following surgery on the circulatory system: Secondary | ICD-10-CM | POA: Diagnosis not present

## 2016-07-09 DIAGNOSIS — Z9981 Dependence on supplemental oxygen: Secondary | ICD-10-CM | POA: Diagnosis not present

## 2016-07-10 DIAGNOSIS — Z9981 Dependence on supplemental oxygen: Secondary | ICD-10-CM | POA: Diagnosis not present

## 2016-07-10 DIAGNOSIS — F17201 Nicotine dependence, unspecified, in remission: Secondary | ICD-10-CM | POA: Diagnosis not present

## 2016-07-10 DIAGNOSIS — I70221 Atherosclerosis of native arteries of extremities with rest pain, right leg: Secondary | ICD-10-CM | POA: Diagnosis not present

## 2016-07-10 DIAGNOSIS — I10 Essential (primary) hypertension: Secondary | ICD-10-CM | POA: Diagnosis not present

## 2016-07-10 DIAGNOSIS — Z48812 Encounter for surgical aftercare following surgery on the circulatory system: Secondary | ICD-10-CM | POA: Diagnosis not present

## 2016-07-12 DIAGNOSIS — Z48812 Encounter for surgical aftercare following surgery on the circulatory system: Secondary | ICD-10-CM | POA: Diagnosis not present

## 2016-07-12 DIAGNOSIS — I10 Essential (primary) hypertension: Secondary | ICD-10-CM | POA: Diagnosis not present

## 2016-07-12 DIAGNOSIS — I70221 Atherosclerosis of native arteries of extremities with rest pain, right leg: Secondary | ICD-10-CM | POA: Diagnosis not present

## 2016-07-12 DIAGNOSIS — Z9981 Dependence on supplemental oxygen: Secondary | ICD-10-CM | POA: Diagnosis not present

## 2016-07-12 DIAGNOSIS — F17201 Nicotine dependence, unspecified, in remission: Secondary | ICD-10-CM | POA: Diagnosis not present

## 2016-07-13 DIAGNOSIS — F17201 Nicotine dependence, unspecified, in remission: Secondary | ICD-10-CM | POA: Diagnosis not present

## 2016-07-13 DIAGNOSIS — I10 Essential (primary) hypertension: Secondary | ICD-10-CM | POA: Diagnosis not present

## 2016-07-13 DIAGNOSIS — I70221 Atherosclerosis of native arteries of extremities with rest pain, right leg: Secondary | ICD-10-CM | POA: Diagnosis not present

## 2016-07-13 DIAGNOSIS — Z48812 Encounter for surgical aftercare following surgery on the circulatory system: Secondary | ICD-10-CM | POA: Diagnosis not present

## 2016-07-13 DIAGNOSIS — Z9981 Dependence on supplemental oxygen: Secondary | ICD-10-CM | POA: Diagnosis not present

## 2016-07-15 DIAGNOSIS — I70221 Atherosclerosis of native arteries of extremities with rest pain, right leg: Secondary | ICD-10-CM | POA: Diagnosis not present

## 2016-07-15 DIAGNOSIS — Z48812 Encounter for surgical aftercare following surgery on the circulatory system: Secondary | ICD-10-CM | POA: Diagnosis not present

## 2016-07-15 DIAGNOSIS — I10 Essential (primary) hypertension: Secondary | ICD-10-CM | POA: Diagnosis not present

## 2016-07-15 DIAGNOSIS — F17201 Nicotine dependence, unspecified, in remission: Secondary | ICD-10-CM | POA: Diagnosis not present

## 2016-07-15 DIAGNOSIS — Z9981 Dependence on supplemental oxygen: Secondary | ICD-10-CM | POA: Diagnosis not present

## 2016-07-17 DIAGNOSIS — F17201 Nicotine dependence, unspecified, in remission: Secondary | ICD-10-CM | POA: Diagnosis not present

## 2016-07-17 DIAGNOSIS — I10 Essential (primary) hypertension: Secondary | ICD-10-CM | POA: Diagnosis not present

## 2016-07-17 DIAGNOSIS — Z48812 Encounter for surgical aftercare following surgery on the circulatory system: Secondary | ICD-10-CM | POA: Diagnosis not present

## 2016-07-17 DIAGNOSIS — Z9981 Dependence on supplemental oxygen: Secondary | ICD-10-CM | POA: Diagnosis not present

## 2016-07-17 DIAGNOSIS — I70221 Atherosclerosis of native arteries of extremities with rest pain, right leg: Secondary | ICD-10-CM | POA: Diagnosis not present

## 2016-07-18 DIAGNOSIS — Z48812 Encounter for surgical aftercare following surgery on the circulatory system: Secondary | ICD-10-CM | POA: Diagnosis not present

## 2016-07-18 DIAGNOSIS — I70221 Atherosclerosis of native arteries of extremities with rest pain, right leg: Secondary | ICD-10-CM | POA: Diagnosis not present

## 2016-07-18 DIAGNOSIS — F17201 Nicotine dependence, unspecified, in remission: Secondary | ICD-10-CM | POA: Diagnosis not present

## 2016-07-18 DIAGNOSIS — I10 Essential (primary) hypertension: Secondary | ICD-10-CM | POA: Diagnosis not present

## 2016-07-18 DIAGNOSIS — Z9981 Dependence on supplemental oxygen: Secondary | ICD-10-CM | POA: Diagnosis not present

## 2016-07-19 DIAGNOSIS — I70411 Atherosclerosis of autologous vein bypass graft(s) of the extremities with intermittent claudication, right leg: Secondary | ICD-10-CM | POA: Diagnosis not present

## 2016-07-19 DIAGNOSIS — I70212 Atherosclerosis of native arteries of extremities with intermittent claudication, left leg: Secondary | ICD-10-CM | POA: Diagnosis not present

## 2016-07-19 DIAGNOSIS — Z01818 Encounter for other preprocedural examination: Secondary | ICD-10-CM | POA: Diagnosis not present

## 2016-07-19 DIAGNOSIS — I7092 Chronic total occlusion of artery of the extremities: Secondary | ICD-10-CM | POA: Diagnosis not present

## 2016-07-22 DIAGNOSIS — F17201 Nicotine dependence, unspecified, in remission: Secondary | ICD-10-CM | POA: Diagnosis not present

## 2016-07-22 DIAGNOSIS — I10 Essential (primary) hypertension: Secondary | ICD-10-CM | POA: Diagnosis not present

## 2016-07-22 DIAGNOSIS — Z9981 Dependence on supplemental oxygen: Secondary | ICD-10-CM | POA: Diagnosis not present

## 2016-07-22 DIAGNOSIS — I70221 Atherosclerosis of native arteries of extremities with rest pain, right leg: Secondary | ICD-10-CM | POA: Diagnosis not present

## 2016-07-22 DIAGNOSIS — Z48812 Encounter for surgical aftercare following surgery on the circulatory system: Secondary | ICD-10-CM | POA: Diagnosis not present

## 2016-07-23 DIAGNOSIS — I70221 Atherosclerosis of native arteries of extremities with rest pain, right leg: Secondary | ICD-10-CM | POA: Diagnosis not present

## 2016-07-23 DIAGNOSIS — F17201 Nicotine dependence, unspecified, in remission: Secondary | ICD-10-CM | POA: Diagnosis not present

## 2016-07-23 DIAGNOSIS — Z48812 Encounter for surgical aftercare following surgery on the circulatory system: Secondary | ICD-10-CM | POA: Diagnosis not present

## 2016-07-23 DIAGNOSIS — I10 Essential (primary) hypertension: Secondary | ICD-10-CM | POA: Diagnosis not present

## 2016-07-23 DIAGNOSIS — Z9981 Dependence on supplemental oxygen: Secondary | ICD-10-CM | POA: Diagnosis not present

## 2016-07-24 DIAGNOSIS — I70221 Atherosclerosis of native arteries of extremities with rest pain, right leg: Secondary | ICD-10-CM | POA: Diagnosis not present

## 2016-07-24 DIAGNOSIS — F17201 Nicotine dependence, unspecified, in remission: Secondary | ICD-10-CM | POA: Diagnosis not present

## 2016-07-24 DIAGNOSIS — Z9981 Dependence on supplemental oxygen: Secondary | ICD-10-CM | POA: Diagnosis not present

## 2016-07-24 DIAGNOSIS — I10 Essential (primary) hypertension: Secondary | ICD-10-CM | POA: Diagnosis not present

## 2016-07-24 DIAGNOSIS — Z48812 Encounter for surgical aftercare following surgery on the circulatory system: Secondary | ICD-10-CM | POA: Diagnosis not present

## 2016-07-25 DIAGNOSIS — I10 Essential (primary) hypertension: Secondary | ICD-10-CM | POA: Diagnosis not present

## 2016-07-25 DIAGNOSIS — Z9981 Dependence on supplemental oxygen: Secondary | ICD-10-CM | POA: Diagnosis not present

## 2016-07-25 DIAGNOSIS — I70221 Atherosclerosis of native arteries of extremities with rest pain, right leg: Secondary | ICD-10-CM | POA: Diagnosis not present

## 2016-07-25 DIAGNOSIS — Z48812 Encounter for surgical aftercare following surgery on the circulatory system: Secondary | ICD-10-CM | POA: Diagnosis not present

## 2016-07-25 DIAGNOSIS — F17201 Nicotine dependence, unspecified, in remission: Secondary | ICD-10-CM | POA: Diagnosis not present

## 2016-07-26 DIAGNOSIS — I70221 Atherosclerosis of native arteries of extremities with rest pain, right leg: Secondary | ICD-10-CM | POA: Diagnosis not present

## 2016-07-26 DIAGNOSIS — I10 Essential (primary) hypertension: Secondary | ICD-10-CM | POA: Diagnosis not present

## 2016-07-26 DIAGNOSIS — F17201 Nicotine dependence, unspecified, in remission: Secondary | ICD-10-CM | POA: Diagnosis not present

## 2016-07-26 DIAGNOSIS — Z48812 Encounter for surgical aftercare following surgery on the circulatory system: Secondary | ICD-10-CM | POA: Diagnosis not present

## 2016-07-26 DIAGNOSIS — Z9981 Dependence on supplemental oxygen: Secondary | ICD-10-CM | POA: Diagnosis not present

## 2016-07-29 DIAGNOSIS — Z48812 Encounter for surgical aftercare following surgery on the circulatory system: Secondary | ICD-10-CM | POA: Diagnosis not present

## 2016-07-29 DIAGNOSIS — I10 Essential (primary) hypertension: Secondary | ICD-10-CM | POA: Diagnosis not present

## 2016-07-29 DIAGNOSIS — F17201 Nicotine dependence, unspecified, in remission: Secondary | ICD-10-CM | POA: Diagnosis not present

## 2016-07-29 DIAGNOSIS — Z9981 Dependence on supplemental oxygen: Secondary | ICD-10-CM | POA: Diagnosis not present

## 2016-07-29 DIAGNOSIS — I70221 Atherosclerosis of native arteries of extremities with rest pain, right leg: Secondary | ICD-10-CM | POA: Diagnosis not present

## 2016-07-30 DIAGNOSIS — I70221 Atherosclerosis of native arteries of extremities with rest pain, right leg: Secondary | ICD-10-CM | POA: Diagnosis not present

## 2016-07-30 DIAGNOSIS — F17201 Nicotine dependence, unspecified, in remission: Secondary | ICD-10-CM | POA: Diagnosis not present

## 2016-07-30 DIAGNOSIS — Z48812 Encounter for surgical aftercare following surgery on the circulatory system: Secondary | ICD-10-CM | POA: Diagnosis not present

## 2016-07-30 DIAGNOSIS — I10 Essential (primary) hypertension: Secondary | ICD-10-CM | POA: Diagnosis not present

## 2016-07-30 DIAGNOSIS — Z9981 Dependence on supplemental oxygen: Secondary | ICD-10-CM | POA: Diagnosis not present

## 2016-07-31 DIAGNOSIS — I70221 Atherosclerosis of native arteries of extremities with rest pain, right leg: Secondary | ICD-10-CM | POA: Diagnosis not present

## 2016-07-31 DIAGNOSIS — Z48812 Encounter for surgical aftercare following surgery on the circulatory system: Secondary | ICD-10-CM | POA: Diagnosis not present

## 2016-07-31 DIAGNOSIS — Z9981 Dependence on supplemental oxygen: Secondary | ICD-10-CM | POA: Diagnosis not present

## 2016-07-31 DIAGNOSIS — I10 Essential (primary) hypertension: Secondary | ICD-10-CM | POA: Diagnosis not present

## 2016-07-31 DIAGNOSIS — F17201 Nicotine dependence, unspecified, in remission: Secondary | ICD-10-CM | POA: Diagnosis not present

## 2016-08-01 DIAGNOSIS — Z9981 Dependence on supplemental oxygen: Secondary | ICD-10-CM | POA: Diagnosis not present

## 2016-08-01 DIAGNOSIS — I10 Essential (primary) hypertension: Secondary | ICD-10-CM | POA: Diagnosis not present

## 2016-08-01 DIAGNOSIS — I70221 Atherosclerosis of native arteries of extremities with rest pain, right leg: Secondary | ICD-10-CM | POA: Diagnosis not present

## 2016-08-01 DIAGNOSIS — F17201 Nicotine dependence, unspecified, in remission: Secondary | ICD-10-CM | POA: Diagnosis not present

## 2016-08-01 DIAGNOSIS — Z48812 Encounter for surgical aftercare following surgery on the circulatory system: Secondary | ICD-10-CM | POA: Diagnosis not present

## 2016-08-02 DIAGNOSIS — I70221 Atherosclerosis of native arteries of extremities with rest pain, right leg: Secondary | ICD-10-CM | POA: Diagnosis not present

## 2016-08-02 DIAGNOSIS — Z48812 Encounter for surgical aftercare following surgery on the circulatory system: Secondary | ICD-10-CM | POA: Diagnosis not present

## 2016-08-02 DIAGNOSIS — I10 Essential (primary) hypertension: Secondary | ICD-10-CM | POA: Diagnosis not present

## 2016-08-02 DIAGNOSIS — F17201 Nicotine dependence, unspecified, in remission: Secondary | ICD-10-CM | POA: Diagnosis not present

## 2016-08-02 DIAGNOSIS — Z9981 Dependence on supplemental oxygen: Secondary | ICD-10-CM | POA: Diagnosis not present

## 2016-08-03 DIAGNOSIS — Z48812 Encounter for surgical aftercare following surgery on the circulatory system: Secondary | ICD-10-CM | POA: Diagnosis not present

## 2016-08-03 DIAGNOSIS — F17201 Nicotine dependence, unspecified, in remission: Secondary | ICD-10-CM | POA: Diagnosis not present

## 2016-08-03 DIAGNOSIS — I70221 Atherosclerosis of native arteries of extremities with rest pain, right leg: Secondary | ICD-10-CM | POA: Diagnosis not present

## 2016-08-03 DIAGNOSIS — Z9981 Dependence on supplemental oxygen: Secondary | ICD-10-CM | POA: Diagnosis not present

## 2016-08-03 DIAGNOSIS — I10 Essential (primary) hypertension: Secondary | ICD-10-CM | POA: Diagnosis not present

## 2016-08-05 DIAGNOSIS — F17201 Nicotine dependence, unspecified, in remission: Secondary | ICD-10-CM | POA: Diagnosis not present

## 2016-08-05 DIAGNOSIS — Z48812 Encounter for surgical aftercare following surgery on the circulatory system: Secondary | ICD-10-CM | POA: Diagnosis not present

## 2016-08-05 DIAGNOSIS — I10 Essential (primary) hypertension: Secondary | ICD-10-CM | POA: Diagnosis not present

## 2016-08-05 DIAGNOSIS — I70221 Atherosclerosis of native arteries of extremities with rest pain, right leg: Secondary | ICD-10-CM | POA: Diagnosis not present

## 2016-08-05 DIAGNOSIS — Z9981 Dependence on supplemental oxygen: Secondary | ICD-10-CM | POA: Diagnosis not present

## 2016-08-08 DIAGNOSIS — I70221 Atherosclerosis of native arteries of extremities with rest pain, right leg: Secondary | ICD-10-CM | POA: Diagnosis not present

## 2016-08-08 DIAGNOSIS — Z9981 Dependence on supplemental oxygen: Secondary | ICD-10-CM | POA: Diagnosis not present

## 2016-08-08 DIAGNOSIS — F17201 Nicotine dependence, unspecified, in remission: Secondary | ICD-10-CM | POA: Diagnosis not present

## 2016-08-08 DIAGNOSIS — Z48812 Encounter for surgical aftercare following surgery on the circulatory system: Secondary | ICD-10-CM | POA: Diagnosis not present

## 2016-08-08 DIAGNOSIS — I10 Essential (primary) hypertension: Secondary | ICD-10-CM | POA: Diagnosis not present

## 2016-08-09 DIAGNOSIS — Z48812 Encounter for surgical aftercare following surgery on the circulatory system: Secondary | ICD-10-CM | POA: Diagnosis not present

## 2016-08-09 DIAGNOSIS — I10 Essential (primary) hypertension: Secondary | ICD-10-CM | POA: Diagnosis not present

## 2016-08-09 DIAGNOSIS — I70221 Atherosclerosis of native arteries of extremities with rest pain, right leg: Secondary | ICD-10-CM | POA: Diagnosis not present

## 2016-08-09 DIAGNOSIS — F17201 Nicotine dependence, unspecified, in remission: Secondary | ICD-10-CM | POA: Diagnosis not present

## 2016-08-09 DIAGNOSIS — Z9981 Dependence on supplemental oxygen: Secondary | ICD-10-CM | POA: Diagnosis not present

## 2016-08-13 DIAGNOSIS — I70221 Atherosclerosis of native arteries of extremities with rest pain, right leg: Secondary | ICD-10-CM | POA: Diagnosis not present

## 2016-08-13 DIAGNOSIS — Z9981 Dependence on supplemental oxygen: Secondary | ICD-10-CM | POA: Diagnosis not present

## 2016-08-13 DIAGNOSIS — F17201 Nicotine dependence, unspecified, in remission: Secondary | ICD-10-CM | POA: Diagnosis not present

## 2016-08-13 DIAGNOSIS — I10 Essential (primary) hypertension: Secondary | ICD-10-CM | POA: Diagnosis not present

## 2016-08-13 DIAGNOSIS — Z48812 Encounter for surgical aftercare following surgery on the circulatory system: Secondary | ICD-10-CM | POA: Diagnosis not present

## 2016-08-14 DIAGNOSIS — I10 Essential (primary) hypertension: Secondary | ICD-10-CM | POA: Diagnosis not present

## 2016-08-14 DIAGNOSIS — Z9981 Dependence on supplemental oxygen: Secondary | ICD-10-CM | POA: Diagnosis not present

## 2016-08-14 DIAGNOSIS — I70221 Atherosclerosis of native arteries of extremities with rest pain, right leg: Secondary | ICD-10-CM | POA: Diagnosis not present

## 2016-08-14 DIAGNOSIS — Z48812 Encounter for surgical aftercare following surgery on the circulatory system: Secondary | ICD-10-CM | POA: Diagnosis not present

## 2016-08-14 DIAGNOSIS — F17201 Nicotine dependence, unspecified, in remission: Secondary | ICD-10-CM | POA: Diagnosis not present

## 2016-08-20 DIAGNOSIS — I70221 Atherosclerosis of native arteries of extremities with rest pain, right leg: Secondary | ICD-10-CM | POA: Diagnosis not present

## 2016-08-20 DIAGNOSIS — Z01812 Encounter for preprocedural laboratory examination: Secondary | ICD-10-CM | POA: Diagnosis not present

## 2016-08-20 DIAGNOSIS — Z7901 Long term (current) use of anticoagulants: Secondary | ICD-10-CM | POA: Diagnosis not present

## 2016-08-20 DIAGNOSIS — I10 Essential (primary) hypertension: Secondary | ICD-10-CM | POA: Diagnosis not present

## 2016-08-20 DIAGNOSIS — Z7982 Long term (current) use of aspirin: Secondary | ICD-10-CM | POA: Diagnosis not present

## 2016-08-20 DIAGNOSIS — Z9981 Dependence on supplemental oxygen: Secondary | ICD-10-CM | POA: Diagnosis not present

## 2016-08-20 DIAGNOSIS — I70222 Atherosclerosis of native arteries of extremities with rest pain, left leg: Secondary | ICD-10-CM | POA: Diagnosis not present

## 2016-08-20 DIAGNOSIS — Z48812 Encounter for surgical aftercare following surgery on the circulatory system: Secondary | ICD-10-CM | POA: Diagnosis not present

## 2016-08-20 DIAGNOSIS — F17201 Nicotine dependence, unspecified, in remission: Secondary | ICD-10-CM | POA: Diagnosis not present

## 2016-08-22 DIAGNOSIS — J0100 Acute maxillary sinusitis, unspecified: Secondary | ICD-10-CM | POA: Diagnosis not present

## 2016-08-23 DIAGNOSIS — I70222 Atherosclerosis of native arteries of extremities with rest pain, left leg: Secondary | ICD-10-CM | POA: Diagnosis not present

## 2016-08-23 DIAGNOSIS — I771 Stricture of artery: Secondary | ICD-10-CM | POA: Diagnosis not present

## 2016-08-23 DIAGNOSIS — I10 Essential (primary) hypertension: Secondary | ICD-10-CM | POA: Diagnosis not present

## 2016-08-27 DIAGNOSIS — Z48812 Encounter for surgical aftercare following surgery on the circulatory system: Secondary | ICD-10-CM | POA: Diagnosis not present

## 2016-08-27 DIAGNOSIS — Z9981 Dependence on supplemental oxygen: Secondary | ICD-10-CM | POA: Diagnosis not present

## 2016-08-27 DIAGNOSIS — I10 Essential (primary) hypertension: Secondary | ICD-10-CM | POA: Diagnosis not present

## 2016-08-27 DIAGNOSIS — I70221 Atherosclerosis of native arteries of extremities with rest pain, right leg: Secondary | ICD-10-CM | POA: Diagnosis not present

## 2016-08-27 DIAGNOSIS — F17201 Nicotine dependence, unspecified, in remission: Secondary | ICD-10-CM | POA: Diagnosis not present

## 2016-09-03 DIAGNOSIS — Z9981 Dependence on supplemental oxygen: Secondary | ICD-10-CM | POA: Diagnosis not present

## 2016-09-03 DIAGNOSIS — I70221 Atherosclerosis of native arteries of extremities with rest pain, right leg: Secondary | ICD-10-CM | POA: Diagnosis not present

## 2016-09-03 DIAGNOSIS — I10 Essential (primary) hypertension: Secondary | ICD-10-CM | POA: Diagnosis not present

## 2016-09-03 DIAGNOSIS — F17201 Nicotine dependence, unspecified, in remission: Secondary | ICD-10-CM | POA: Diagnosis not present

## 2016-09-03 DIAGNOSIS — Z48812 Encounter for surgical aftercare following surgery on the circulatory system: Secondary | ICD-10-CM | POA: Diagnosis not present

## 2016-09-04 DIAGNOSIS — I70221 Atherosclerosis of native arteries of extremities with rest pain, right leg: Secondary | ICD-10-CM | POA: Diagnosis not present

## 2016-09-04 DIAGNOSIS — F17201 Nicotine dependence, unspecified, in remission: Secondary | ICD-10-CM | POA: Diagnosis not present

## 2016-09-04 DIAGNOSIS — Z9981 Dependence on supplemental oxygen: Secondary | ICD-10-CM | POA: Diagnosis not present

## 2016-09-04 DIAGNOSIS — I10 Essential (primary) hypertension: Secondary | ICD-10-CM | POA: Diagnosis not present

## 2016-09-04 DIAGNOSIS — Z48812 Encounter for surgical aftercare following surgery on the circulatory system: Secondary | ICD-10-CM | POA: Diagnosis not present

## 2016-09-20 ENCOUNTER — Other Ambulatory Visit: Payer: Self-pay | Admitting: Surgery

## 2016-09-20 DIAGNOSIS — I739 Peripheral vascular disease, unspecified: Secondary | ICD-10-CM

## 2016-09-30 DIAGNOSIS — H524 Presbyopia: Secondary | ICD-10-CM | POA: Diagnosis not present

## 2016-09-30 DIAGNOSIS — H2513 Age-related nuclear cataract, bilateral: Secondary | ICD-10-CM | POA: Diagnosis not present

## 2016-09-30 DIAGNOSIS — H25013 Cortical age-related cataract, bilateral: Secondary | ICD-10-CM | POA: Diagnosis not present

## 2016-09-30 DIAGNOSIS — H52203 Unspecified astigmatism, bilateral: Secondary | ICD-10-CM | POA: Diagnosis not present

## 2016-10-04 ENCOUNTER — Other Ambulatory Visit (HOSPITAL_COMMUNITY): Payer: Self-pay

## 2016-10-04 ENCOUNTER — Encounter (HOSPITAL_COMMUNITY): Payer: Self-pay

## 2016-10-07 ENCOUNTER — Encounter: Payer: Self-pay | Admitting: Surgery

## 2016-10-07 ENCOUNTER — Ambulatory Visit (INDEPENDENT_AMBULATORY_CARE_PROVIDER_SITE_OTHER): Payer: Medicare Other | Admitting: Family Medicine

## 2016-10-07 ENCOUNTER — Encounter: Payer: Self-pay | Admitting: Family Medicine

## 2016-10-07 VITALS — BP 128/60 | HR 85 | Temp 97.6°F | Resp 12 | Ht 61.0 in | Wt 92.4 lb

## 2016-10-07 DIAGNOSIS — I739 Peripheral vascular disease, unspecified: Secondary | ICD-10-CM | POA: Diagnosis not present

## 2016-10-07 DIAGNOSIS — I10 Essential (primary) hypertension: Secondary | ICD-10-CM | POA: Diagnosis not present

## 2016-10-07 DIAGNOSIS — J449 Chronic obstructive pulmonary disease, unspecified: Secondary | ICD-10-CM

## 2016-10-07 DIAGNOSIS — L570 Actinic keratosis: Secondary | ICD-10-CM | POA: Diagnosis not present

## 2016-10-07 DIAGNOSIS — Z23 Encounter for immunization: Secondary | ICD-10-CM

## 2016-10-07 DIAGNOSIS — G63 Polyneuropathy in diseases classified elsewhere: Secondary | ICD-10-CM

## 2016-10-07 MED ORDER — FLUTICASONE FUROATE-VILANTEROL 200-25 MCG/INH IN AEPB: 1.0000 | INHALATION_SPRAY | Freq: Every day | RESPIRATORY_TRACT | 1 refills | Status: DC

## 2016-10-07 NOTE — Progress Notes (Signed)
HPI:   [name] is a 82 y.o. female, who is here today with her daughter to establish care with me.  Former PCP: [place] Last preventive routine visit: over a year ago.  Concerns today: referrals.   HTN, PAD,COPD among some.   Hypertension:   Many years. Currently on Amlodipine 5 mg daily   She is taking medications as instructed, no side effects reported.  She has not noted unusual headache, visual changes, exertional chest pain, dyspnea,  focal weakness, or edema.    COPD: Currently she is on Advair 250-50 mcg once daily, She has had Brio before and cheaper. Proair inhaler occasionally, 0-2 times per week.  3-4 exacerbations per year. According to pt, she got a steroid and abx injection q 3 months from former PCP.  Supplemental O2 2 LPM as needed, if O2 sat < 89-90%, which is infrequent.  She denies any current cough, wheezing, or dyspnea. She has not followed with pulmonologist but daughter would like for her to do so. Problem overall stable.   PAD: S/P RLE revascularization surgery (05/2016), reporting some complications after procedure, finally wound was let heal by second intention. She already has an appt with vascular surgeon. Quit smoking about 10 years ago.  Having pain of RLE, numbness on dorsum of foot and part of distal extremity. She is on Tramadol 50 mg, which she usually takes at night. "Striking pain" Pain is sometimes severe and wakes her up. She has not identified exacerbating factors, alleviated by Tramadol.  She denies cyanosis. According to pt, she was told nerves damaged from PAD might take a few months to "wake up."  Dermatology referral: She has  "some spots" on her face and daughter would like a "good check-up." FHx of melanoma (brother).  Daugther would like an appt with her dermatologists, Dr Carla Dawson. Had some lesions removed from face, no malignant.  No fall in the past year. A couple weeks ago she tripped with  "something", did not fall but caused small laceration right pretibial area. Healing well, no pai or drainage.   In general independent ADL's. She does not drive.  She is now living with daugher and son in [place]. No falls in the past year.  -Hx of hearing loss, R>L. She has hearing aid for right ear, it is broken, so she is not wearing it. She has refused Flu vaccine in the past.    Review of Systems  Constitutional: Negative for activity change, appetite change, fatigue, fever and unexpected weight change.  HENT: Positive for hearing loss. Negative for mouth sores, nosebleeds and trouble swallowing.   Eyes: Negative for pain and visual disturbance.  Respiratory: Negative for cough, shortness of breath and wheezing.   Cardiovascular: Negative for chest pain, palpitations and leg swelling.  Gastrointestinal: Negative for abdominal pain, nausea and vomiting.       Negative for changes in bowel habits.  Genitourinary: Negative for decreased urine volume, difficulty urinating and hematuria.  Musculoskeletal: Negative for gait problem and neck pain.  Skin: Positive for wound. Negative for rash.  Neurological: Positive for numbness. Negative for seizures, syncope, weakness and headaches.  Psychiatric/Behavioral: Positive for sleep disturbance (occasionally). Negative for confusion. The patient is not nervous/anxious.       No current outpatient prescriptions on file prior to visit.   No current facility-administered medications on file prior to visit.      Past Medical History:  Diagnosis Date  . Allergy   . Arthritis   .  Chicken pox   . Chronic bronchitis ([place])   . Hypertension    Not on File  Family History  Problem Relation Age of Onset  . Arthritis Mother   . Hypertension Mother   . Arthritis Father   . Hypertension Father   . Cancer Brother   . Hypertension Son     Social History   Social History  . Marital status: Unknown    Spouse name: N/A  . Number of  children: N/A  . Years of education: N/A   Social History Main Topics  . Smoking status: Former Smoker    Quit date: 12/09/2005  . Smokeless tobacco: Never Used  . Alcohol use No  . Drug use: No  . Sexual activity: Not Currently   Other Topics Concern  . None   Social History Narrative  . None    Vitals:   10/07/16 1303  BP: 128/60  Pulse: 85  Resp: 12  Temp: 97.6 F (36.4 C)   O2 sat 90% at RA.  Body mass index is 17.45 kg/m.   Physical Exam  Nursing note and vitals reviewed. Constitutional: She is oriented to person, place, and time. She appears well-developed. No distress.  HENT:  Head: Atraumatic.  Mouth/Throat: Oropharynx is clear and moist and mucous membranes are normal. She has dentures.  Eyes: Conjunctivae and EOM are normal. Pupils are equal, round, and reactive to light.  Neck: No JVD present. No tracheal deviation present. No thyroid mass and no thyromegaly present.  Cardiovascular: Normal rate and regular rhythm.   No murmur heard. Pulses:      Dorsalis pedis pulses are 2+ on the right side, and 2+ on the left side.  Respiratory: Effort normal and breath sounds normal. No respiratory distress.  GI: Soft. She exhibits no mass. There is no hepatomegaly. There is no tenderness.  Musculoskeletal: She exhibits no edema.  Neurological: She is alert and oriented to person, place, and time. She has normal strength. Coordination normal.  Skin: Skin is warm. No rash noted. No erythema.     Round clear/sanguinolent 1 cm, crust, dry. No erythema, no tender. No fluctuant area.  Psychiatric: She has a normal mood and affect.  Well groomed, good eye contact.      ASSESSMENT AND PLAN:     Carla Dawson was seen today for establish care.  Diagnoses and all orders for this visit:    Actinic keratoses  Skin lesions scattered on face some are AK. Dermatology referral placed. Avoid direct sun light, wear sun screen and mechanical protection (big hat and  appropriate clothing).   -     Ambulatory referral to Dermatology  Chronic obstructive pulmonary disease, unspecified COPD type ([place])  Stable. Breo seems to be cheaper and once daily, so Advair changed for Breo. Continue Albuterol as needed. If exacerbation occurs she needs to be evaluated to determine need for steroids and/or abx treatment. Pulmonology referral placed.  -     Ambulatory referral to Pulmonology -     fluticasone furoate-vilanterol (BREO ELLIPTA) 200-25 MCG/INH AEPB; Inhale 1 puff into the lungs daily.  Essential hypertension, benign  Adequately controlled. No changes in current management. DASH-low salt diet recommended. Eye exam periodically. F/U in 4-6 months, before if needed.  PAD (peripheral artery disease) (HCC)  Continue Plavix. Former smoker. She is not on statin medication. Today good pulses on LE's. Keep appt with vascular.    Neuropathy due to peripheral vascular disease (HCC)  Continue Tramadol, some side effects discussed. Fall/injury  preventions and skin care discussed. May consider Gabapentin, Lyrica, or Cymbalta next OV. F/U in 2 months.   Need for immunization against influenza -     Flu vaccine HIGH DOSE PF       Constant Mandeville G. Martinique, MD  [facility]. [place] office.

## 2016-10-07 NOTE — Progress Notes (Signed)
Pre visit review using our clinic review tool, if applicable. No additional management support is needed unless otherwise documented below in the visit note. 

## 2016-10-07 NOTE — Patient Instructions (Addendum)
A few things to remember from today's visit:   Actinic keratoses - Plan: Ambulatory referral to Dermatology  Chronic obstructive pulmonary disease, unspecified COPD type ([place]) - Plan: Ambulatory referral to Pulmonology, fluticasone furoate-vilanterol (BREO ELLIPTA) 200-25 MCG/INH AEPB  Essential hypertension, benign  PAD (peripheral artery disease) (HCC)  Right leg pain  Neuropathy due to peripheral vascular disease ([place])  Skin care, Vaseline on lesion right leg.  A few tips:  -As we age balance is not as good as it was, so there is a higher risks for falls. Please remove small rugs and furniture that is "in your way" and could increase the risk of falls. Stretching exercises may help with fall prevention: Yoga and Tai Chi are some examples. Low impact exercise is better, so you are not very achy the next day.  -Sun screen and avoidance of direct sun light recommended. Caution with dehydration, if working outdoors be sure to drink enough fluids.  - Some medications are not safe as we age, increases the risk of side effects and can potentially interact with other medication you are also taken;  including some of over the counter medications. Be sure to let me know when you start a new medication even if it is a dietary/vitamin supplement.   -Healthy diet low in red meet/animal fat and sugar + regular physical activity is recommended.       Medicare covers a annual preventive visit, which is strongly recommended , it is once per year and involves a series of questions to identify risk factors; so we can try to prevent possible complications. This does not need to be done by a doctor.  We have a nurse Investment banker, corporate) here that is highly qualified to do it, it can be arrange same date you have a follow up appointment with me or labs scheduled, and it 100% covered by Medicare. So before you leave today I would like for you to arrange visit with Carla Dawson for Medicare wellness  visit.   Please be sure medication list is accurate.

## 2016-10-09 ENCOUNTER — Encounter: Payer: Self-pay | Admitting: Surgery

## 2016-10-09 DIAGNOSIS — H2511 Age-related nuclear cataract, right eye: Secondary | ICD-10-CM | POA: Diagnosis not present

## 2016-10-11 ENCOUNTER — Ambulatory Visit (HOSPITAL_COMMUNITY)
Admission: RE | Admit: 2016-10-11 | Discharge: 2016-10-11 | Disposition: A | Discharge status: Home / Self Care | Payer: Medicare Other | Source: Ambulatory Visit | Attending: Vascular Surgery | Admitting: Vascular Surgery

## 2016-10-11 ENCOUNTER — Ambulatory Visit (INDEPENDENT_AMBULATORY_CARE_PROVIDER_SITE_OTHER)
Admission: RE | Admit: 2016-10-11 | Discharge: 2016-10-11 | Disposition: A | Discharge status: Home / Self Care | Payer: Medicare Other | Source: Ambulatory Visit | Attending: Vascular Surgery | Admitting: Vascular Surgery

## 2016-10-11 DIAGNOSIS — I10 Essential (primary) hypertension: Secondary | ICD-10-CM | POA: Insufficient documentation

## 2016-10-11 DIAGNOSIS — Z87891 Personal history of nicotine dependence: Secondary | ICD-10-CM | POA: Diagnosis not present

## 2016-10-11 DIAGNOSIS — I739 Peripheral vascular disease, unspecified: Secondary | ICD-10-CM | POA: Diagnosis not present

## 2016-10-11 DIAGNOSIS — Z95828 Presence of other vascular implants and grafts: Secondary | ICD-10-CM | POA: Insufficient documentation

## 2016-10-14 ENCOUNTER — Encounter: Payer: Self-pay | Admitting: Surgery

## 2016-10-14 ENCOUNTER — Ambulatory Visit: Payer: Self-pay | Admitting: Family Medicine

## 2016-10-14 ENCOUNTER — Ambulatory Visit (INDEPENDENT_AMBULATORY_CARE_PROVIDER_SITE_OTHER): Payer: Medicare Other | Admitting: Surgery

## 2016-10-14 VITALS — BP 126/60 | HR 72 | Temp 98.2°F | Resp 24 | Ht 61.0 in | Wt 93.0 lb

## 2016-10-14 DIAGNOSIS — I739 Peripheral vascular disease, unspecified: Secondary | ICD-10-CM | POA: Diagnosis not present

## 2016-10-14 DIAGNOSIS — I70213 Atherosclerosis of native arteries of extremities with intermittent claudication, bilateral legs: Secondary | ICD-10-CM | POA: Diagnosis not present

## 2016-10-14 MED ORDER — GABAPENTIN 300 MG PO CAPS: 300.0000 mg | ORAL_CAPSULE | Freq: Every day | ORAL | 2 refills | Status: DC
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
## 2016-10-14 NOTE — Progress Notes (Signed)
Referring Physician: Blima Ledger   Patient name: Carla Dawson MRN: [id number] DOB: Nov 02, 1931 Sex: female  REASON FOR CONSULHPI:T/: 82 y/o female with a history of right lower extremity rest pain due to sever PAD.  Most recently she under went revascularization of the right LE femoral to posterior tibial bypass with saphenous vein.  She had bilateral femoral pulses and no history of ulcers.  She has had multiple procedures on bilateral LE for her symptoms of rest pain.  Starting in Sept. 2016: Left Fem-pop atherectomy, left ATA pta, stent, pta.  Right Artherectomy tpa femoral stent and then right fem-PT trunk bypass.    She is here to stay with her daughter half of the year and to go back to [place] half the year.  She reports doing well right now.  She walks multiple times a day and has no rest pain.  She states that her feet do feel strange " like there is sand between her toes.  She takes ultram at night to help her rest.    Past medical history:  COPD managed with inhalers, Plavix daily s/p stent placement, and HTN managed with Norvasc.  She states she has no history of CAD or DM.       Past Medical History:  Diagnosis Date  . Allergy   . Arthritis   . Chicken pox   . Chronic bronchitis ([place])   . Hypertension    Past Surgical History:  Procedure Laterality Date  . ABDOMINAL HYSTERECTOMY    . VEIN BYPASS SURGERY      Family History  Problem Relation Age of Onset  . Arthritis Mother   . Hypertension Mother   . Arthritis Father   . Hypertension Father   . Cancer Brother   . Hypertension Son     SOCIAL HISTORY: Social History   Social History  . Marital status: Divorced    Spouse name: N/A  . Number of children: N/A  . Years of education: N/A   Occupational History  . Not on file.   Social History Main Topics  . Smoking status: Former Smoker    Quit date: 12/09/2005  . Smokeless tobacco: Never Used  . Alcohol use No  . Drug use: No  . Sexual  activity: Not Currently   Other Topics Concern  . Not on file   Social History Narrative  . No narrative on file    Not on File  Current Outpatient Prescriptions  Medication Sig Dispense Refill  . albuterol (PROVENTIL HFA;VENTOLIN HFA) 108 (90 Base) MCG/ACT inhaler Inhale into the lungs.    Marland Kitchen amLODipine (NORVASC) 5 MG tablet Take 5 mg by mouth daily.    . clopidogrel (PLAVIX) 75 MG tablet Take 75 mg by mouth daily.    . fluticasone furoate-vilanterol (BREO ELLIPTA) 200-25 MCG/INH AEPB Inhale 1 puff into the lungs daily. 60 each 1  . traMADol (ULTRAM) 50 MG tablet Take 1 tablet by mouth every 6 hours as needed for pain.     No current facility-administered medications for this visit.     ROS:   General:  No weight loss, Fever, chills  HEENT: No recent headaches, no nasal bleeding, no visual changes, no sore throat  Neurologic: No dizziness, blackouts, seizures. No recent symptoms of stroke or mini- stroke. No recent episodes of slurred speech, or temporary blindness.  Cardiac: No recent episodes of chest pain/pressure, no shortness of breath at rest.  No shortness of breath with exertion.  Denies history  of atrial fibrillation or irregular heartbeat  Vascular: positive history of rest pain in feet.  positive history of claudication.  No history of non-healing ulcer, No history of DVT   Pulmonary: No home oxygen, no productive cough, no hemoptysis,  positive asthma or wheezing  Musculoskeletal:  [ ]  Arthritis, [ ]  Low back pain,  [ ]  Joint pain  Hematologic:No history of hypercoagulable state.  No history of easy bleeding.  No history of anemia  Gastrointestinal: No hematochezia or melena,  No gastroesophageal reflux, no trouble swallowing  Urinary: [ ]  chronic Kidney disease, [ ]  on HD - [ ]  MWF or [ ]  TTHS, [ ]  Burning with urination, [ ]  Frequent urination, [ ]  Difficulty urinating;   Skin: No rashes  Psychological: No history of anxiety,  No history of  depression   Physical Examination   General:  Alert and oriented, no acute distress HEENT: Normal Neck: No bruit or JVD Pulmonary: Clear to auscultation bilaterally Cardiac: Regular Rate and Rhythm without murmur Abdomen: Soft, non-tender, non-distended, no mass, no scars Skin: No rash Extremity Pulses:  2+ radial, brachial, femoral, no pedal pulses dorsalis pedis, posterior tibial pulses bilaterally Musculoskeletal: No deformity or minimal right ante rio lower leg edema .  Small superficial mid anterior shin wound 44 week old. Healing well. Neurologic: Upper and lower extremity motor 5/5 and symmetric  DATA:  ABI: Right 0.81 with Biphasic flow and dampened digit Left 0.83 PT biphasic flow, DP monophasic flow Bilateral Common femoral stenosis with elevated velocities 50-74% Left popliteal artery velocity of 227 suggesting 50-74% stenosis  ASSESSMENT:   Sever PAD currently stable s/p multiple procedures  Sept. 2016:  Left Fem-pop atherectomy, left ATA pta, stent, pta.    Right Artherectomy tpa femoral stent and then right fem-PT trunk bypass.    Officer denote and procedure notes were reviewed by Dr. Trula Slade today 100 + pages.  PLAN:   She is stable.  She is able to ambulate for exercise daily for short bouts.  Nor rest pain and no history of ulcers.  Sh reports symptoms of peripheral neuropathy " sand between her toes and numbness.  We will start her on Gabapentin 300 mg QHS and see if we can control these symptoms without tramadol..  She will f/u in 6 month for repeat ABI and arterial duplex of bilateral LE.  If she has symptoms of claudicatory/rest pain she will call us right away.    COLLINS, EMMA MAUREEN PA-C [facility]  The patient was seen in conjunction with Dr. Trula Slade today.     I agree with the above.  I have seen and evaluated the patient.  I have reviewed her accompanying documents, greater than 100 pages.  Total time spent was  greater than 45 minutes.  The patient within the past year has had bilateral rest pain, treated in [place].  She has undergone percutaneous revascularization of the right leg which was complicated by acute occlusion as well as perforation requiring covered stenting.  This ultimately led to surgical revascularization which appears to be a right femoral to tibioperoneal trunk bypass graft.  On ultrasound today that was widely patent.  On the left leg the patient has also undergone multiple percutaneous interventions including atherectomy of the left superficial femoral and popliteal artery as well as angioplasty of the anterior tibial artery.  Ultrasound today shows no significant stenosis.  The patient appears to be relatively pain free other than describing what sounds like bilateral neuropathic  pain.  She occasionally will take Ultram at night to help with her symptoms.  I have recommended trying Neurontin as this sounds like neuropathy.  No acute intervention is warranted at this time but rather I have recommended surveillance protocol with the next imaging study in 6 months.  [name]

## 2016-10-15 ENCOUNTER — Ambulatory Visit (INDEPENDENT_AMBULATORY_CARE_PROVIDER_SITE_OTHER): Payer: Medicare Other

## 2016-10-15 ENCOUNTER — Other Ambulatory Visit: Payer: Self-pay

## 2016-10-15 VITALS — BP 130/60 | HR 79 | Ht 61.0 in | Wt 93.6 lb

## 2016-10-15 DIAGNOSIS — Z0000 Encounter for general adult medical examination without abnormal findings: Secondary | ICD-10-CM | POA: Diagnosis not present

## 2016-10-15 MED ORDER — CLOPIDOGREL BISULFATE 75 MG PO TABS: 75.0000 mg | ORAL_TABLET | Freq: Every day | ORAL | 1 refills | Status: DC

## 2016-10-15 NOTE — Patient Instructions (Addendum)
Carla Dawson , Thank you for taking time to come for your Medicare Wellness Visit. I appreciate your ongoing commitment to your health goals. Please review the following plan we discussed and let me know if I can assist you in the future.   Will fup on hearing at [employer];   Had first pneumonia vaccine in 2000; around 82 yo Need one more pneumonia vaccination (PREVNAR) and declines today;  Prefers to wait   Will try to complete AD; Given copy  Referred to [facility] for questions [place] offers free advance directive forms, as well as assistance in completing the forms themselves. For assistance, contact the Spiritual Care Department at [phone], or the Clinical Social Work Department at [phone].   Will discuss DEXA repeat with Dr. Martinique in March  Educated to check with insurance regarding coverage of Shingles vaccination on Part D or Part B and may have lower co-pay if provided on the Part D side   Recommendations for Dexa Scan Female over the age of 33 Man age 19 or older If you broke a bone past the age of 9 Women menopausal age with risk factors (thin frame; smoker; hx of fx ) Post menopausal women under the age of 53 with risk factors A man age 40 to 20 with risk factors Other: Spine xray that is showing break of bone loss Back pain with possible break Height loss of 1/2 inch or more within one year Total loss in height of 1.5 inches from your original height  Calcium 1215m with Vit D 800u per day; more as directed by physician Strength building exercises discussed; can include walking; housework; small weights or stretch bands; silver sneakers if access to the [facility]      These are the goals we discussed: Goals    . patient          Would like information on activities  [facility] Address: [address] [place] [address] Hours:  Open today  8AM-8PM Phone: [phone]  [place] [facility]; [phone]. [place] [phone] resource to  get information on any and all community programs for Seniors    Adult center for Enrichment;  [employer] [phone] Adult day services include Adult Day Care, [facility], Education and [facility] and information regarding [place] at the; [employer]Address: [address] [place] [address] Phone: ([phone] Deaf & Hard of [place]- can assist with hearing aid x 1  No reviews  [place] [address] [address]  [phone]        This is a list of the screening recommended for you and due dates:  Health Maintenance  Topic Date Due  . Shingles Vaccine  11/14/1993  . DEXA scan (bone density measurement)  11/14/1998  . Pneumonia vaccines (1 of 2 - PCV13) 11/14/1998  . Tetanus Vaccine  12/10/2023  . Flu Shot  Completed       Bone Densitometry Bone densitometry is an imaging test that uses a special X-ray to measure the amount of calcium and other minerals in your bones (bone density). This test is also known as a bone mineral density test or dual-energy X-ray absorptiometry (DXA). The test can measure bone density at your hip and your spine. It is similar to having a regular X-ray. You  may have this test to:  Diagnose a condition that causes weak or thin bones (osteoporosis).  Predict your risk of a broken bone (fracture).  Determine how well osteoporosis treatment is working. LET [facility] CARE PROVIDER KNOW ABOUT:  Any allergies you have.  All medicines you are taking, including vitamins, herbs, eye drops, creams, and over-the-counter medicines.  Previous problems you or members of your family have had with the use of anesthetics.  Any blood disorders you have.  Previous surgeries you have had.  Medical conditions you have.  Possibility of pregnancy.  Any other  medical test you had within the previous 14 days that used contrast material. RISKS AND COMPLICATIONS Generally, this is a safe procedure. However, problems can occur and may include the following:  This test exposes you to a very small amount of radiation.  The risks of radiation exposure may be greater to unborn children. BEFORE THE PROCEDURE  Do not take any calcium supplements for 24 hours before having the test. You can otherwise eat and drink what you usually do.  Take off all metal jewelry, eyeglasses, dental appliances, and any other metal objects. PROCEDURE  You may lie on an exam table. There will be an X-ray generator below you and an imaging device above you.  Other devices, such as boxes or braces, may be used to position your body properly for the scan.  You will need to lie still while the machine slowly scans your body.  The images will show up on a computer monitor. AFTER THE PROCEDURE You may need more testing at a later time.   This information is not intended to replace advice given to you by your health care provider. Make sure you discuss any questions you have with your health care provider.   Document Released: 12/17/2004 Document Revised: 12/16/2014 Document Reviewed: 05/05/2014 Elsevier Interactive Patient Education 2016 [place] in the Home  Falls can cause injuries. They can happen to people of all ages. There are many things you can do to make your home safe and to help prevent falls.  WHAT CAN I DO ON THE OUTSIDE OF MY HOME?  Regularly fix the edges of walkways and driveways and fix any cracks.  Remove anything that might make you trip as you walk through a door, such as a raised step or threshold.  Trim any bushes or trees on the path to your home.  Use bright outdoor lighting.  Clear any walking paths of anything that might make someone trip, such as rocks or tools.  Regularly check to see if handrails are loose or  broken. Make sure that both sides of any steps have handrails.  Any raised decks and porches should have guardrails on the edges.  Have any leaves, snow, or ice cleared regularly.  Use sand or salt on walking paths during winter.  Clean up any spills in your garage right away. This includes oil or grease spills. WHAT CAN I DO IN THE BATHROOM?   Use night lights.  Install grab bars by the toilet and in the tub and shower. Do not use towel bars as grab bars.  Use non-skid mats or decals in the tub or shower.  If you need to sit down in the shower, use a plastic, non-slip stool.  Keep the floor dry. Clean up any water that spills on the floor as soon as it happens.  Remove soap buildup in the tub or shower regularly.  Attach bath  mats securely with double-sided non-slip rug tape.  Do not have throw rugs and other things on the floor that can make you trip. WHAT CAN I DO IN THE BEDROOM?  Use night lights.  Make sure that you have a light by your bed that is easy to reach.  Do not use any sheets or blankets that are too big for your bed. They should not hang down onto the floor.  Have a firm chair that has side arms. You can use this for support while you get dressed.  Do not have throw rugs and other things on the floor that can make you trip. WHAT CAN I DO IN THE KITCHEN?  Clean up any spills right away.  Avoid walking on wet floors.  Keep items that you use a lot in easy-to-reach places.  If you need to reach something above you, use a strong step stool that has a grab bar.  Keep electrical cords out of the way.  Do not use floor polish or wax that makes floors slippery. If you must use wax, use non-skid floor wax.  Do not have throw rugs and other things on the floor that can make you trip. WHAT CAN I DO WITH MY STAIRS?  Do not leave any items on the stairs.  Make sure that there are handrails on both sides of the stairs and use them. Fix handrails that are  broken or loose. Make sure that handrails are as long as the stairways.  Check any carpeting to make sure that it is firmly attached to the stairs. Fix any carpet that is loose or worn.  Avoid having throw rugs at the top or bottom of the stairs. If you do have throw rugs, attach them to the floor with carpet tape.  Make sure that you have a light switch at the top of the stairs and the bottom of the stairs. If you do not have them, ask someone to add them for you. WHAT ELSE CAN I DO TO HELP PREVENT FALLS?  Wear shoes that:  Do not have high heels.  Have rubber bottoms.  Are comfortable and fit you well.  Are closed at the toe. Do not wear sandals.  If you use a stepladder:  Make sure that it is fully opened. Do not climb a closed stepladder.  Make sure that both sides of the stepladder are locked into place.  Ask someone to hold it for you, if possible.  Clearly mark and make sure that you can see:  Any grab bars or handrails.  First and last steps.  Where the edge of each step is.  Use tools that help you move around (mobility aids) if they are needed. These include:  Canes.  Walkers.  Scooters.  Crutches.  Turn on the lights when you go into a dark area. Replace any light bulbs as soon as they burn out.  Set up your furniture so you have a clear path. Avoid moving your furniture around.  If any of your floors are uneven, fix them.  If there are any pets around you, be aware of where they are.  Review your medicines with your doctor. Some medicines can make you feel dizzy. This can increase your chance of falling. Ask your doctor what other things that you can do to help prevent falls.   This information is not intended to replace advice given to you by your health care provider. Make sure you discuss any questions you have with  your health care provider.   Document Released: 09/21/2009 Document Revised: 04/11/2015 Document Reviewed: 12/30/2014 Elsevier  Interactive Patient Education 2016 [place] Maintenance, Female Adopting a healthy lifestyle and getting preventive care can go a long way to promote health and wellness. Talk with your health care provider about what schedule of regular examinations is right for you. This is a good chance for you to check in with your provider about disease prevention and staying healthy. In between checkups, there are plenty of things you can do on your own. Experts have done a lot of research about which lifestyle changes and preventive measures are most likely to keep you healthy. Ask your health care provider for more information. WEIGHT AND DIET  Eat a healthy diet  Be sure to include plenty of vegetables, fruits, low-fat dairy products, and lean protein.  Do not eat a lot of foods high in solid fats, added sugars, or salt.  Get regular exercise. This is one of the most important things you can do for your health.  Most adults should exercise for at least 150 minutes each week. The exercise should increase your heart rate and make you sweat (moderate-intensity exercise).  Most adults should also do strengthening exercises at least twice a week. This is in addition to the moderate-intensity exercise.  Maintain a healthy weight  Body mass index (BMI) is a measurement that can be used to identify possible weight problems. It estimates body fat based on height and weight. Your health care provider can help determine your BMI and help you achieve or maintain a healthy weight.  For females 70 years of age and older:   A BMI below 18.5 is considered underweight.  A BMI of 18.5 to 24.9 is normal.  A BMI of 25 to 29.9 is considered overweight.  A BMI of 30 and above is considered obese.  Watch levels of cholesterol and blood lipids  You should start having your blood tested for lipids and cholesterol at 80 years of age, then have this test every 5 years.  You may need to have your  cholesterol levels checked more often if:  Your lipid or cholesterol levels are high.  You are older than 80 years of age.  You are at high risk for heart disease.  CANCER SCREENING   Lung Cancer  Lung cancer screening is recommended for adults 41-75 years old who are at high risk for lung cancer because of a history of smoking.  A yearly low-dose CT scan of the lungs is recommended for people who:  Currently smoke.  Have quit within the past 15 years.  Have at least a 30-pack-year history of smoking. A pack year is smoking an average of one pack of cigarettes a day for 1 year.  Yearly screening should continue until it has been 15 years since you quit.  Yearly screening should stop if you develop a health problem that would prevent you from having lung cancer treatment.  Breast Cancer  Practice breast self-awareness. This means understanding how your breasts normally appear and feel.  It also means doing regular breast self-exams. Let your health care provider know about any changes, no matter how small.  If you are in your 20s or 30s, you should have a clinical breast exam (CBE) by a health care provider every 1-3 years as part of a regular health exam.  If you are 76 or older, have a CBE every year. Also consider having a breast X-ray (mammogram)  every year.  If you have a family history of breast cancer, talk to your health care provider about genetic screening.  If you are at high risk for breast cancer, talk to your health care provider about having an MRI and a mammogram every year.  Breast cancer gene (BRCA) assessment is recommended for women who have family members with BRCA-related cancers. BRCA-related cancers include:  Breast.  Ovarian.  Tubal.  Peritoneal cancers.  Results of the assessment will determine the need for genetic counseling and BRCA1 and BRCA2 testing. Cervical Cancer Your health care provider may recommend that you be screened regularly  for cancer of the pelvic organs (ovaries, uterus, and vagina). This screening involves a pelvic examination, including checking for microscopic changes to the surface of your cervix (Pap test). You may be encouraged to have this screening done every 3 years, beginning at age 24.  For women ages 18-65, health care providers may recommend pelvic exams and Pap testing every 3 years, or they may recommend the Pap and pelvic exam, combined with testing for human papilloma virus (HPV), every 5 years. Some types of HPV increase your risk of cervical cancer. Testing for HPV may also be done on women of any age with unclear Pap test results.  Other health care providers may not recommend any screening for nonpregnant women who are considered low risk for pelvic cancer and who do not have symptoms. Ask your health care provider if a screening pelvic exam is right for you.  If you have had past treatment for cervical cancer or a condition that could lead to cancer, you need Pap tests and screening for cancer for at least 20 years after your treatment. If Pap tests have been discontinued, your risk factors (such as having a new sexual partner) need to be reassessed to determine if screening should resume. Some women have medical problems that increase the chance of getting cervical cancer. In these cases, your health care provider may recommend more frequent screening and Pap tests. Colorectal Cancer  This type of cancer can be detected and often prevented.  Routine colorectal cancer screening usually begins at 80 years of age and continues through 80 years of age.  Your health care provider may recommend screening at an earlier age if you have risk factors for colon cancer.  Your health care provider may also recommend using home test kits to check for hidden blood in the stool.  A small camera at the end of a tube can be used to examine your colon directly (sigmoidoscopy or colonoscopy). This is done to check  for the earliest forms of colorectal cancer.  Routine screening usually begins at age 81.  Direct examination of the colon should be repeated every 5-10 years through 80 years of age. However, you may need to be screened more often if early forms of precancerous polyps or small growths are found. Skin Cancer  Check your skin from head to toe regularly.  Tell your health care provider about any new moles or changes in moles, especially if there is a change in a mole's shape or color.  Also tell your health care provider if you have a mole that is larger than the size of a pencil eraser.  Always use sunscreen. Apply sunscreen liberally and repeatedly throughout the day.  Protect yourself by wearing long sleeves, pants, a wide-brimmed hat, and sunglasses whenever you are outside. HEART DISEASE, DIABETES, AND HIGH BLOOD PRESSURE   High blood pressure causes heart disease and  increases the risk of stroke. High blood pressure is more likely to develop in:  People who have blood pressure in the high end of the normal range (130-139/85-89 mm Hg).  People who are overweight or obese.  People who are African American.  If you are 61-11 years of age, have your blood pressure checked every 3-5 years. If you are 10 years of age or older, have your blood pressure checked every year. You should have your blood pressure measured twice--once when you are at a hospital or clinic, and once when you are not at a hospital or clinic. Record the average of the two measurements. To check your blood pressure when you are not at a hospital or clinic, you can use:  An automated blood pressure machine at a pharmacy.  A home blood pressure monitor.  If you are between 57 years and 71 years old, ask your health care provider if you should take aspirin to prevent strokes.  Have regular diabetes screenings. This involves taking a blood sample to check your fasting blood sugar level.  If you are at a normal  weight and have a low risk for diabetes, have this test once every three years after 80 years of age.  If you are overweight and have a high risk for diabetes, consider being tested at a younger age or more often. PREVENTING INFECTION  Hepatitis B  If you have a higher risk for hepatitis B, you should be screened for this virus. You are considered at high risk for hepatitis B if:  You were born in a country where hepatitis B is common. Ask your health care provider which countries are considered high risk.  Your parents were born in a high-risk country, and you have not been immunized against hepatitis B (hepatitis B vaccine).  You have HIV or AIDS.  You use needles to inject street drugs.  You live with someone who has hepatitis B.  You have had sex with someone who has hepatitis B.  You get hemodialysis treatment.  You take certain medicines for conditions, including cancer, organ transplantation, and autoimmune conditions. Hepatitis C  Blood testing is recommended for:  Everyone born from [birth year] through 1963.  Anyone with known risk factors for hepatitis C. Sexually transmitted infections (STIs)  You should be screened for sexually transmitted infections (STIs) including gonorrhea and chlamydia if:  You are sexually active and are younger than 80 years of age.  You are older than 80 years of age and your health care provider tells you that you are at risk for this type of infection.  Your sexual activity has changed since you were last screened and you are at an increased risk for chlamydia or gonorrhea. Ask your health care provider if you are at risk.  If you do not have HIV, but are at risk, it may be recommended that you take a prescription medicine daily to prevent HIV infection. This is called pre-exposure prophylaxis (PrEP). You are considered at risk if:  You are sexually active and do not regularly use condoms or know the HIV status of your partner(s).  You take  drugs by injection.  You are sexually active with a partner who has HIV. Talk with your health care provider about whether you are at high risk of being infected with HIV. If you choose to begin PrEP, you should first be tested for HIV. You should then be tested every 3 months for as long as you are taking PrEP.  PREGNANCY   If you are premenopausal and you may become pregnant, ask your health care provider about preconception counseling.  If you may become pregnant, take 400 to 800 micrograms (mcg) of folic acid every day.  If you want to prevent pregnancy, talk to your health care provider about birth control (contraception). OSTEOPOROSIS AND MENOPAUSE   Osteoporosis is a disease in which the bones lose minerals and strength with aging. This can result in serious bone fractures. Your risk for osteoporosis can be identified using a bone density scan.  If you are 19 years of age or older, or if you are at risk for osteoporosis and fractures, ask your health care provider if you should be screened.  Ask your health care provider whether you should take a calcium or vitamin D supplement to lower your risk for osteoporosis.  Menopause may have certain physical symptoms and risks.  Hormone replacement therapy may reduce some of these symptoms and risks. Talk to your health care provider about whether hormone replacement therapy is right for you.  HOME CARE INSTRUCTIONS   Schedule regular health, dental, and eye exams.  Stay current with your immunizations.   Do not use any tobacco products including cigarettes, chewing tobacco, or electronic cigarettes.  If you are pregnant, do not drink alcohol.  If you are breastfeeding, limit how much and how often you drink alcohol.  Limit alcohol intake to no more than 1 drink per day for nonpregnant women. One drink equals 12 ounces of beer, 5 ounces of wine, or 1 ounces of hard liquor.  Do not use street drugs.  Do not share  needles.  Ask your health care provider for help if you need support or information about quitting drugs.  Tell your health care provider if you often feel depressed.  Tell your health care provider if you have ever been abused or do not feel safe at home.   This information is not intended to replace advice given to you by your health care provider. Make sure you discuss any questions you have with your health care provider.   Document Released: 06/10/2011 Document Revised: 12/16/2014 Document Reviewed: 10/27/2013 Elsevier Interactive Patient Education [employer].

## 2016-10-16 DIAGNOSIS — H2511 Age-related nuclear cataract, right eye: Secondary | ICD-10-CM | POA: Diagnosis not present

## 2016-10-16 DIAGNOSIS — H2512 Age-related nuclear cataract, left eye: Secondary | ICD-10-CM | POA: Diagnosis not present

## 2016-10-20 NOTE — Progress Notes (Signed)
I have reviewed documentation from this visit and I agree with recommendations given.  Britiney Blahnik G. Carely Nappier, MD  [facility]. [place] office.   

## 2016-10-22 DIAGNOSIS — L814 Other melanin hyperpigmentation: Secondary | ICD-10-CM | POA: Diagnosis not present

## 2016-10-22 DIAGNOSIS — R208 Other disturbances of skin sensation: Secondary | ICD-10-CM | POA: Diagnosis not present

## 2016-10-22 DIAGNOSIS — D225 Melanocytic nevi of trunk: Secondary | ICD-10-CM | POA: Diagnosis not present

## 2016-10-22 DIAGNOSIS — L570 Actinic keratosis: Secondary | ICD-10-CM | POA: Diagnosis not present

## 2016-10-22 DIAGNOSIS — L821 Other seborrheic keratosis: Secondary | ICD-10-CM | POA: Diagnosis not present

## 2016-10-23 ENCOUNTER — Other Ambulatory Visit: Payer: Self-pay | Admitting: Family Medicine

## 2016-10-23 DIAGNOSIS — H2512 Age-related nuclear cataract, left eye: Secondary | ICD-10-CM | POA: Diagnosis not present

## 2016-10-23 NOTE — Telephone Encounter (Signed)
Pt is out °

## 2016-10-25 ENCOUNTER — Ambulatory Visit (INDEPENDENT_AMBULATORY_CARE_PROVIDER_SITE_OTHER)
Admission: RE | Admit: 2016-10-25 | Discharge: 2016-10-25 | Disposition: A | Discharge status: Home / Self Care | Payer: Medicare Other | Source: Ambulatory Visit | Attending: Internal Medicine | Admitting: Internal Medicine

## 2016-10-25 ENCOUNTER — Encounter: Payer: Self-pay | Admitting: Internal Medicine

## 2016-10-25 ENCOUNTER — Ambulatory Visit (INDEPENDENT_AMBULATORY_CARE_PROVIDER_SITE_OTHER): Payer: Medicare Other | Admitting: Internal Medicine

## 2016-10-25 VITALS — BP 130/74 | HR 74 | Ht 61.0 in | Wt 92.6 lb

## 2016-10-25 DIAGNOSIS — J449 Chronic obstructive pulmonary disease, unspecified: Secondary | ICD-10-CM

## 2016-10-25 DIAGNOSIS — I70213 Atherosclerosis of native arteries of extremities with intermittent claudication, bilateral legs: Secondary | ICD-10-CM | POA: Diagnosis not present

## 2016-10-25 DIAGNOSIS — R938 Abnormal findings on diagnostic imaging of other specified body structures: Secondary | ICD-10-CM | POA: Diagnosis not present

## 2016-10-25 DIAGNOSIS — R9389 Abnormal findings on diagnostic imaging of other specified body structures: Secondary | ICD-10-CM

## 2016-10-25 MED ORDER — BUDESONIDE-FORMOTEROL FUMARATE 160-4.5 MCG/ACT IN AERO: INHALATION_SPRAY | RESPIRATORY_TRACT | 12 refills | Status: DC

## 2016-10-25 MED ORDER — BUDESONIDE-FORMOTEROL FUMARATE 160-4.5 MCG/ACT IN AERO: INHALATION_SPRAY | RESPIRATORY_TRACT | 11 refills | Status: DC

## 2016-10-25 NOTE — Patient Instructions (Addendum)
Work on inhaler technique:  relax and gently blow all the way out then take a nice smooth deep breath back in, triggering the inhaler at same time you start breathing in.  Hold for up to 5 seconds if you can. Blow out thru nose. Rinse and gargle with water when done  Plan A = Automatic = Symbicort 160 Take 2 puffs first thing in am and then another 2 puffs about 12 hours later.   Plan B = Backup Only use your albuterol (proair) as a rescue medication to be used if you can't catch your breath by resting or doing a relaxed purse lip breathing pattern.  - The less you use it, the better it will work when you need it. - Ok to use the inhaler up to 2 puffs  every 4 hours if you must but call for appointment if use goes up over your usual need - Don't leave home without it !!  (think of it like the spare tire for your car)   Plan C = Crisis - only use your albuterol nebulizer if you first try Plan B and it fails to help > ok to use the nebulizer up to every 4 hours but if start needing it regularly call for immediate appointment   Plan D = Doctor - call me if B and C not adequate  Plan E = ER - go to ER or call 911 if all else fails     Please remember to go to the  x-ray department downstairs for your tests - we will call you with the results when they are available.   Return in March 2018 with pfts on return  Late add : and cxr

## 2016-10-25 NOTE — Telephone Encounter (Signed)
Albuterol inh can be sent to pharmacy with 1 refill. Pending pulmonology appt.  Thanks, BJ

## 2016-10-25 NOTE — Progress Notes (Signed)
Subjective:    Patient ID: Carla Dawson, female    DOB: 01-31-1931,    MRN: [id number]  HPI   60 yowf  Quit smoking 2007 with dx of copd in 2002 while living in [place] rx with Advair then Breo since quit smoking referred to pulmonary clinic 10/25/2016 by Dr   Inez Catalina Martinique   10/25/2016 1st [facility] office visit/ Carla Dawson   Chief Complaint  Patient presents with  . Pulmonary Consult    Referred by Dr. Betty Martinique.  Pt here to est care for COPD. She c/o SOB with walking approx 800 ft on a flat surface, eating and walking up an incline. She has some PND but not coughing much.    MMRC2 = can't walk a nl pace on a flat grade s sob but does fine slow and flat eg walking up to 800 ft - she measures the distances herself to keep tack as also limited by claudication - rarely feels need for saba   She has significant c/o throat congestion on BREO and some hoarseness but since on it No obvious  day to day or daytime variabilty or assoc excess/ purulent sputum or mucus plugs   or cp or chest tightness, subjective wheeze overt sinus or hb symptoms. No unusual exp hx or h/o childhood pna/ asthma or knowledge of premature birth.  Sleeping ok without nocturnal  or early am exacerbation  of respiratory  c/o's or need for noct saba. Also denies any obvious fluctuation of symptoms with weather or environmental changes or other aggravating or alleviating factors except as outlined above   Current Medications, Allergies, Complete Past Medical History, Past Surgical History, Family History, and Social History were reviewed in [employer] record.           Review of Systems  Constitutional: Negative for chills, fever and unexpected weight change.  HENT: Positive for postnasal drip. Negative for congestion, dental problem, ear pain, nosebleeds, rhinorrhea, sinus pressure, sneezing, sore throat, trouble swallowing and voice change.   Eyes: Negative for visual  disturbance.  Respiratory: Positive for cough and shortness of breath. Negative for choking.   Cardiovascular: Negative for chest pain and leg swelling.  Gastrointestinal: Negative for abdominal pain, diarrhea and vomiting.  Genitourinary: Negative for difficulty urinating.  Musculoskeletal: Negative for arthralgias.  Skin: Negative for rash.  Neurological: Negative for tremors, syncope and headaches.  Hematological: Does not bruise/bleed easily.       Objective:   Physical Exam  Hoarse amb wf nad   Wt Readings from Last 3 Encounters:  10/25/16 92 lb 9.6 oz (42 kg)  10/15/16 93 lb 9 oz (42.4 kg)  10/14/16 93 lb (42.2 kg)    Vital signs reviewed  - Note on arrival 02 sats  96% on RA     HEENT: nl dentition, turbinates, and oropharynx. Nl external ear canals without cough reflex   NECK :  without JVD/Nodes/TM/ nl carotid upstrokes bilaterally   LUNGS: no acc muscle use, barrel chest/ distant bs bilaterally  CV:  RRR  no s3 or murmur or increase in P2, no edema   ABD:  soft and nontender with pos late insp hoover's in supine position. No bruits or organomegaly, bowel sounds nl  MS:  Nl gait/ ext warm without deformities, calf tenderness, cyanosis or clubbing No obvious joint restrictions   SKIN: warm and dry without lesions    NEURO:  alert, approp, nl sensorium with  no motor deficits  CXR PA and Lateral:   10/25/2016 :    I personally reviewed images and agree with radiology impression as follows:   COPD. Subtle right upper lobe nodularity. Given the patient's smoking history. Chest CT scanning is recommended to exclude abnormal nodules.  .my impression:  There is a wispy density that in not a def nodule in RUL and can f/u with repeat cxr in March as clearly not a candidate for any form of early intervention     Assessment & Plan:

## 2016-10-26 DIAGNOSIS — R9389 Abnormal findings on diagnostic imaging of other specified body structures: Secondary | ICD-10-CM | POA: Insufficient documentation

## 2016-10-26 NOTE — Assessment & Plan Note (Signed)
10/25/2016  After extensive coaching HFA effectiveness =    75% > try symbicort 160 2bid insteady of breo   She appears to have severe copd but well compensated on maint rx with ics/laba with min need for saba   My concern with BREO is that it may be the cause of some of her upper airway symptoms and she either needs a rescue saba that's dpi (Proair respiclick) or master hfa for both the maint and RX for which one option is symb 160 2bid and saba prn   She will return for f/u pfts in March as plans to winter in [place].  Total time devoted to counseling  = 35/14m review case with pt/ daughter discussion of options/alternatives/ personally creating written instructions  in presence of pt  then going over those specific  Instructions directly with the pt including how to use all of the meds but in particular covering each new medication in detail and the difference between the maintenance/automatic meds and the prns using an action plan format for the latter.

## 2016-10-26 NOTE — Assessment & Plan Note (Signed)
There is a wispy density that in not a def nodule in RUL and can f/u with repeat cxr in March as clearly not a candidate for any form of early intervention

## 2016-10-29 NOTE — Addendum Note (Signed)
Addended by: Lianne Cure A on: 10/29/2016 10:13 AM   Modules accepted: Orders

## 2016-11-18 ENCOUNTER — Encounter (HOSPITAL_COMMUNITY): Payer: Self-pay

## 2016-11-18 ENCOUNTER — Other Ambulatory Visit (HOSPITAL_COMMUNITY): Payer: Self-pay

## 2016-11-18 ENCOUNTER — Encounter: Payer: Self-pay | Admitting: Surgery

## 2016-11-26 DIAGNOSIS — J0100 Acute maxillary sinusitis, unspecified: Secondary | ICD-10-CM | POA: Diagnosis not present

## 2016-11-28 DIAGNOSIS — J0100 Acute maxillary sinusitis, unspecified: Secondary | ICD-10-CM | POA: Diagnosis not present

## 2016-12-30 ENCOUNTER — Ambulatory Visit: Payer: Medicare Other | Admitting: Family Medicine

## 2017-01-18 ENCOUNTER — Other Ambulatory Visit: Payer: Self-pay | Admitting: Family Medicine

## 2017-01-18 DIAGNOSIS — J449 Chronic obstructive pulmonary disease, unspecified: Secondary | ICD-10-CM

## 2017-02-05 DIAGNOSIS — J0100 Acute maxillary sinusitis, unspecified: Secondary | ICD-10-CM | POA: Diagnosis not present

## 2017-02-07 DIAGNOSIS — J189 Pneumonia, unspecified organism: Secondary | ICD-10-CM | POA: Diagnosis not present

## 2017-02-07 DIAGNOSIS — I4891 Unspecified atrial fibrillation: Secondary | ICD-10-CM | POA: Diagnosis not present

## 2017-02-07 DIAGNOSIS — I739 Peripheral vascular disease, unspecified: Secondary | ICD-10-CM | POA: Diagnosis not present

## 2017-02-07 DIAGNOSIS — I5032 Chronic diastolic (congestive) heart failure: Secondary | ICD-10-CM | POA: Diagnosis not present

## 2017-02-07 DIAGNOSIS — I110 Hypertensive heart disease with heart failure: Secondary | ICD-10-CM | POA: Diagnosis not present

## 2017-02-07 DIAGNOSIS — J441 Chronic obstructive pulmonary disease with (acute) exacerbation: Secondary | ICD-10-CM | POA: Diagnosis not present

## 2017-02-07 DIAGNOSIS — I10 Essential (primary) hypertension: Secondary | ICD-10-CM | POA: Diagnosis not present

## 2017-02-07 DIAGNOSIS — Z9981 Dependence on supplemental oxygen: Secondary | ICD-10-CM | POA: Diagnosis not present

## 2017-02-07 DIAGNOSIS — Z87891 Personal history of nicotine dependence: Secondary | ICD-10-CM | POA: Diagnosis not present

## 2017-02-09 DIAGNOSIS — Z87891 Personal history of nicotine dependence: Secondary | ICD-10-CM | POA: Diagnosis not present

## 2017-02-09 DIAGNOSIS — I739 Peripheral vascular disease, unspecified: Secondary | ICD-10-CM | POA: Diagnosis not present

## 2017-02-09 DIAGNOSIS — I110 Hypertensive heart disease with heart failure: Secondary | ICD-10-CM | POA: Diagnosis not present

## 2017-02-09 DIAGNOSIS — Z9981 Dependence on supplemental oxygen: Secondary | ICD-10-CM | POA: Diagnosis not present

## 2017-02-09 DIAGNOSIS — R0602 Shortness of breath: Secondary | ICD-10-CM | POA: Diagnosis not present

## 2017-02-09 DIAGNOSIS — I10 Essential (primary) hypertension: Secondary | ICD-10-CM | POA: Diagnosis not present

## 2017-02-09 DIAGNOSIS — I5032 Chronic diastolic (congestive) heart failure: Secondary | ICD-10-CM | POA: Diagnosis not present

## 2017-02-09 DIAGNOSIS — I4891 Unspecified atrial fibrillation: Secondary | ICD-10-CM | POA: Diagnosis not present

## 2017-02-09 DIAGNOSIS — J441 Chronic obstructive pulmonary disease with (acute) exacerbation: Secondary | ICD-10-CM | POA: Diagnosis not present

## 2017-02-17 ENCOUNTER — Ambulatory Visit: Payer: Medicare Other | Admitting: Family Medicine

## 2017-02-19 ENCOUNTER — Telehealth: Payer: Self-pay

## 2017-02-19 NOTE — Telephone Encounter (Signed)
rec'd call from pt's daughter.  Requested to move pt's f/u appt. to an earlier date.  Stated she has a sore on one of her great toes.  Stated she thinks it is the left gr. Toe.  Reported the sore has been slow to heal, but is looking better; denied any drainage at this time.  Reported the area "had been split open, but looks like it is healing now."  Stated "I would feel better to get her in sooner and get the process started." Advised that a Scheduler will call her to offer appt. options.  Agreed.

## 2017-02-21 NOTE — Telephone Encounter (Signed)
Scheduled 3/19 and 3/28. Confirmed with pt's daughter.

## 2017-02-24 ENCOUNTER — Ambulatory Visit (HOSPITAL_COMMUNITY)
Admission: RE | Admit: 2017-02-24 | Discharge: 2017-02-24 | Disposition: A | Discharge status: Home / Self Care | Payer: Medicare Other | Source: Ambulatory Visit | Attending: Surgery | Admitting: Surgery

## 2017-02-24 ENCOUNTER — Encounter: Payer: Self-pay | Admitting: Family

## 2017-02-24 ENCOUNTER — Ambulatory Visit: Payer: Medicare Other | Admitting: Internal Medicine

## 2017-02-24 ENCOUNTER — Encounter (HOSPITAL_COMMUNITY): Payer: Medicare Other

## 2017-02-24 ENCOUNTER — Ambulatory Visit (INDEPENDENT_AMBULATORY_CARE_PROVIDER_SITE_OTHER): Payer: Medicare Other | Admitting: Family

## 2017-02-24 ENCOUNTER — Other Ambulatory Visit: Payer: Self-pay

## 2017-02-24 ENCOUNTER — Ambulatory Visit (INDEPENDENT_AMBULATORY_CARE_PROVIDER_SITE_OTHER)
Admission: RE | Admit: 2017-02-24 | Discharge: 2017-02-24 | Disposition: A | Discharge status: Home / Self Care | Payer: Medicare Other | Source: Ambulatory Visit | Attending: Surgery | Admitting: Surgery

## 2017-02-24 VITALS — BP 135/66 | HR 70 | Temp 97.6°F | Resp 20 | Ht 61.0 in | Wt 96.3 lb

## 2017-02-24 DIAGNOSIS — I70213 Atherosclerosis of native arteries of extremities with intermittent claudication, bilateral legs: Secondary | ICD-10-CM

## 2017-02-24 DIAGNOSIS — I739 Peripheral vascular disease, unspecified: Secondary | ICD-10-CM

## 2017-02-24 DIAGNOSIS — Z87891 Personal history of nicotine dependence: Secondary | ICD-10-CM

## 2017-02-24 DIAGNOSIS — Z95828 Presence of other vascular implants and grafts: Secondary | ICD-10-CM | POA: Insufficient documentation

## 2017-02-24 NOTE — Progress Notes (Signed)
[facility]   CC: Follow up peripheral artery occlusive disease  History of Present Illness Carla Dawson is a 83 y.o. female patient of Dr. Trula Slade with a history of right lower extremity rest pain due to sever PAD.  Most recently she under went revascularization of the right LE femoral to posterior tibial bypass with saphenous vein.  She had bilateral femoral pulses and no history of ulcers.  She has had multiple procedures on bilateral LE for her symptoms of rest pain.  Starting in Sept. 2016: Left Fem-pop atherectomy, left ATA pta, stent, pta.  Right Artherectomy tpa femoral stent and then right fem-PT trunk bypass. Both procedures were performed in [place], [place].   She was staying with her daughter half of the year and back to [place] half the year, but has moved to this area permanently. She has no rest pain.  She states that her feet do feel strange " like there is sand between her toes.  She takes ultram at night to help her rest.    Past medical history:  COPD managed with inhalers, Plavix daily s/p stent placement, and HTN managed with Norvasc.  She states she has no history of CAD or DM.    Pt was last evaluated on 10-14-16 by Dr. Trula Slade and Jerilynn Mages. [employer]. At that time, within the prior year, pt had had bilateral rest pain, treated in [place].  She had undergone percutaneous revascularization of the right leg which was complicated by acute occlusion as well as perforation requiring covered stenting.  This ultimately led to surgical revascularization which appeared to be a right femoral to tibioperoneal trunk bypass graft.  On ultrasound that was widely patent.  On the left leg the patient had also undergone multiple percutaneous interventions including atherectomy of the left superficial femoral and popliteal artery as well as angioplasty of the anterior tibial artery.  Ultrasound that day showed no significant stenosis.  The patient appeared to be  relatively pain free other than describing what sounded like bilateral neuropathic pain.  She occasionally will take Ultram at night to help with her symptoms.  Dr. Trula Slade recommended trying Neurontin as this sounded like neuropathy.  No acute intervention was warranted at that time but rather surveillance protocol with the next imaging study in 6 months.  She returns today after rec'd call from pt's daughter.  Requested to move pt's f/u appt. to an earlier date.  Stated she has a sore on one of her great toes.  Stated she thinks it is the left gr. Toe.  Reported the sore has been slow to heal, but is looking better; denied any drainage at this time.  Reported the area "had been split open, but looks like it is healing now."  Stated "I would feel better to get her in sooner and get the process started".  In the last 1-2 months she has developed left calf claudication after walking about "600 steps" and painful left toes at rest.   She denies any known history of stroke.  Pt Diabetic: No Pt smoker: former smoker, quit in 2007, smoked x 52 years  Pt meds include: Statin :No Betablocker: No ASA: No Other anticoagulants/antiplatelets: Plavix   Past Medical History:  Diagnosis Date  . Allergy   . Arthritis   . Chicken pox   . Chronic bronchitis ([place])   . Hypertension     Social History Social History  Substance Use Topics  . Smoking status: Former Smoker  Packs/day: 1.50    Years: 52.00    Start date: 12/09/1953    Quit date: 12/09/2005  . Smokeless tobacco: Never Used  . Alcohol use No    Family History Family History  Problem Relation Age of Onset  . Arthritis Mother   . Hypertension Mother   . Arthritis Father   . Hypertension Father   . Cancer Brother   . Hypertension Son     Past Surgical History:  Procedure Laterality Date  . ABDOMINAL HYSTERECTOMY    . VEIN BYPASS SURGERY      Allergies  Allergen Reactions  . Lortab [Hydrocodone-Acetaminophen]     Breathing  issues; itching; took benedryl     Current Outpatient Prescriptions  Medication Sig Dispense Refill  . amLODipine (NORVASC) 5 MG tablet Take 5 mg by mouth daily.    . cetirizine (ZYRTEC) 10 MG tablet Take 10 mg by mouth daily.    . clopidogrel (PLAVIX) 75 MG tablet Take 1 tablet (75 mg total) by mouth daily. 90 tablet 1  . Fiber, Guar Gum, CHEW Chew by mouth daily.    . Fluticasone-Salmeterol (ADVAIR DISKUS) 250-50 MCG/DOSE AEPB Inhale 1 puff into the lungs 2 (two) times daily.    . Multiple Vitamins-Minerals (SENIOR MULTIVITAMIN PLUS) TABS Take 1 tablet by mouth daily.    Marland Kitchen PROAIR HFA 108 (90 Base) MCG/ACT inhaler INHALE 2 PUFFS BY MOUTH EVERY 4 HOURS FOR WHEEZING 8.5 g 1  . dextromethorphan (DELSYM) 30 MG/5ML liquid Take by mouth as needed for cough.    . gabapentin (NEURONTIN) 300 MG capsule Take 1 capsule (300 mg total) by mouth daily. (Patient not taking: Reported on 02/24/2017) 30 capsule 2   No current facility-administered medications for this visit.     ROS: See HPI for pertinent positives and negatives.   Physical Examination  Vitals:   02/24/17 1115  BP: 135/66  Pulse: 70  Resp: 20  Temp: 97.6 F (36.4 C)  TempSrc: Oral  SpO2: 96%  Weight: 96 lb 4.8 oz (43.7 kg)  Height: 5\' 1"  (1.549 m)   Body mass index is 18.2 kg/m.  General: A&O x 3, WDWN, thin elderly female. Gait: normal Eyes: PERRLA. Pulmonary: Respirations are somewhat labored at rest, occasional cough, limited air movement, no rales, rhonchi, or wheezes.  Cardiac: regular rhythm, no detected murmur.         Carotid Bruits Right Left   Negative Negative  Aorta is not palpable. Radial pulses: 2+ palpable and =                           VASCULAR EXAM: Extremities without ischemic changes, without Gangrene; without open wounds. Feet are cool to touch and moderately painful to touch. Healed lesions at left heal and great toe.                                                                                                            LE Pulses Right Left       FEMORAL  palpable   palpable        POPLITEAL  not palpable   not palpable       POSTERIOR TIBIAL  not palpable   not palpable        DORSALIS PEDIS      ANTERIOR TIBIAL not palpable  not palpable    Abdomen: soft, NT, no palpable masses. Skin: no rashes, no ulcers noted. Musculoskeletal: Age appropriate muscle wasting and atrophy. Moderate kyphosis.   Neurologic: A&O X 3; Appropriate Affect, MOTOR FUNCTION:  moving all extremities equally, motor strength 4/5 throughout. Speech is fluent/normal. CN 2-12 intact.    ASSESSMENT: Carla Dawson is a 83 y.o. female who presents with: worsening rest pain in the left toes and new claudication in the left calf after 600 steps.   She is s/p left Fem-pop atherectomy, left ATA pta, stent, pta.  Right Artherectomy tpa femoral stent and then right fem-PT trunk bypass. Both procedures were performed in [place], [place], about 2016 and 2017.    In the last 1-2 months she has developed left calf claudication after walking about "600 steps" and painful left toes at rest.  She continues to have right foot pain to touch.  Left leg has new and worsening claudication with corresponding high velocity of 511 cm/s proximal to left SFA stent. Dr. Trula Slade will access the right groin, intervene on the left leg; see Plan.  DATA Today's bilateral LE arterial duplex demonstrates a widely patent right femorotibial artery bypass graft with no stenosis noted in the graft; however, there is disease and elevated velocities in the of the native inflow artery (50-74%). Significant stenosis (75-99%) involving the inflow of the left SFA stent (511 cm/s) and native vessel distal to the stent (150 cm/s) (30-49%).  Hyperplasia noted within the stent without significant changes.   ABI: Right: 0.92 ( 0.81, 10-11-16); waveforms: PT: triphasic, DP: biphasic; TBI: dampened.  Left: 0.48 (0.83, 10-11-16), waveforms:  monophasic; TBI: no waveform detected.  Right ABI has improved slightly, left has declined significantly.   PLAN:  Based on the patient's vascular studies and examination, and after discussing with Dr. Trula Slade, pt will be scheduled for an arteriogram with bilateral run off, possible intervention, on 03-04-17, by Dr. Trula Slade.   I discussed in depth with the patient the nature of atherosclerosis, and emphasized the importance of maximal medical management including strict control of blood pressure, blood glucose, and lipid levels, obtaining regular exercise, and continued cessation of smoking.  The patient is aware that without maximal medical management the underlying atherosclerotic disease process will progress, limiting the benefit of any interventions.  The patient was given information about PAD including signs, symptoms, treatment, what symptoms should prompt the patient to seek immediate medical care, and risk reduction measures to take.  Clemon Chambers, RN, MSN, FNP-C [facility] Phone: [phone]  Clinic MD: Trula Slade  02/24/17 11:45 AM

## 2017-02-24 NOTE — Progress Notes (Signed)
HPI:   [name] is a 83 y.o. female, who is here today with her daughter to follow on some chronic medical problems.  Last seen on 10/07/16. Since her last OV she has followed with vascular surgeon , severe PAD.Left LE PAD getting worse, claudication. Planing on trying to place a stent if possible. + Calves achy sensation at rest,toes pain L>R. Pain is exacerbated by walking short distances.She has not noted erythema or cyanosis.   She has also followed with Carla Dawson, COPD. According to daughter, Advair was changed to Symbicort but seems like she had an "allergic reaction", so she went back Advair. + Exertional dyspnea, currently her symptoms are at baseline.   She had a recent COPD exerbation and hospitalized on 02/10/17 to 02/14/17.She was discharged on O2 supplementation (2 LPM) continuely and now for the past few days she is using it as needed. She denies fever,chills, or decreased appetite.    HTN: Currently she is on Amlodipine 5 mg daily.  She is not checking BP at home.  Denies severe/frequent headache, visual changes, chest pain, worsening dyspnea, palpitation, focal weakness, or edema.   Chronic pain due to neuropathy from PAD, she tells me that she is not longer on Tramadol   Gabapentin 300 mg,which she is taking as needed for pain but lately she has not needed it, has taken 2 tabs in the past month.  -She has long toenails and daughter would like to see a podiatrists for toenails care.She is afraid of cutting toenails because Hx of PAD and on Plavix.    Review of Systems  Constitutional: Negative for activity change, appetite change, fatigue and fever.  HENT: Negative for mouth sores, nosebleeds and trouble swallowing.   Eyes: Negative for pain and visual disturbance.  Respiratory: Positive for shortness of breath. Negative for cough and wheezing.   Cardiovascular: Negative for chest pain, palpitations and leg swelling.  Gastrointestinal:  Negative for abdominal pain, nausea and vomiting.       Negative for changes in bowel habits.  Genitourinary: Negative for decreased urine volume and hematuria.  Musculoskeletal: Positive for arthralgias, gait problem and myalgias.  Skin: Negative for rash and wound.  Neurological: Positive for numbness. Negative for syncope, weakness and headaches.  Psychiatric/Behavioral: Negative for confusion and sleep disturbance.      Current Outpatient Prescriptions on File Prior to Visit  Medication Sig Dispense Refill  . amLODipine (NORVASC) 5 MG tablet Take 5 mg by mouth every evening.     . cetirizine (ZYRTEC) 10 MG tablet Take 10 mg by mouth daily.    . clopidogrel (PLAVIX) 75 MG tablet Take 1 tablet (75 mg total) by mouth daily. (Patient taking differently: Take 75 mg by mouth every evening. ) 90 tablet 1  . dextromethorphan (DELSYM) 30 MG/5ML liquid Take 30 mg by mouth as needed for cough.     . Fiber, Guar Gum, CHEW Chew 1 tablet by mouth daily.     Marland Kitchen gabapentin (NEURONTIN) 300 MG capsule Take 1 capsule (300 mg total) by mouth daily. (Patient taking differently: Take 300 mg by mouth 2 (two) times daily as needed (pain). ) 30 capsule 2  . Multiple Vitamins-Minerals (SENIOR MULTIVITAMIN PLUS) TABS Take 1 tablet by mouth daily.    Marland Kitchen PROAIR HFA 108 (90 Base) MCG/ACT inhaler INHALE 2 PUFFS BY MOUTH EVERY 4 HOURS FOR WHEEZING (Patient taking differently: INHALE 2 PUFFS BY MOUTH EVERY 4 HOURS AS NEEDED FOR WHEEZING) 8.5 g 1  No current facility-administered medications on file prior to visit.      Past Medical History:  Diagnosis Date  . Allergy   . Arthritis   . Chicken pox   . Chronic bronchitis ([place])   . Hypertension    Allergies  Allergen Reactions  . Lortab [Hydrocodone-Acetaminophen]     Breathing issues; itching; took benedryl     Social History   Social History  . Marital status: Divorced    Spouse name: N/A  . Number of children: N/A  . Years of education: N/A    Social History Main Topics  . Smoking status: Former Smoker    Packs/day: 1.50    Years: 52.00    Start date: 12/09/1953    Quit date: 12/09/2005  . Smokeless tobacco: Never Used  . Alcohol use No  . Drug use: No  . Sexual activity: Not Currently   Other Topics Concern  . None   Social History Narrative  . None    Vitals:   02/25/17 1435  BP: 124/64  Pulse: 84  Resp: 12  O2 sat 90-91% at RA. Body mass index is 18.35 kg/m.   Physical Exam  Nursing note and vitals reviewed. Constitutional: She is oriented to person, place, and time. She appears well-developed and well-nourished. No distress.  HENT:  Head: Atraumatic.  Mouth/Throat: Oropharynx is clear and moist and mucous membranes are normal. She has dentures.  Eyes: Conjunctivae and EOM are normal.  Cardiovascular: Normal rate and regular rhythm.   No murmur heard. Left PT and R DP present.  Respiratory: Effort normal. No respiratory distress. She has decreased breath sounds (mild and diffuse). She has no wheezes. She has no rales.  GI: Soft. She exhibits no mass. There is no hepatomegaly. There is no tenderness.  Musculoskeletal: She exhibits no edema or tenderness.  Lymphadenopathy:    She has no cervical adenopathy.  Neurological: She is alert and oriented to person, place, and time. Coordination normal.  No focal deficit appreciated. Stable gait with no assistance today.  Skin: Skin is warm. No rash noted. No erythema.  Psychiatric: She has a normal mood and affect.  Well groomed, good eye contact.      ASSESSMENT AND PLAN:   Carla Dawson was seen today for follow-up.  Diagnoses and all orders for this visit:  Neuropathy due to peripheral vascular disease ([place])  Continue with Gabapentin as recommended,she feels like prn medication is helping. Not longer on Tramadol. Foot care discussed,daughter will arrange appt with podiatrists for foot care. Fall precautions. Continue following with  vascular.  Essential hypertension, benign  Adequately controlled. No changes in current management. DASH-low diet recommended. Eye exam recommended annually. F/U in 5-6 months, before if needed.  COPD exacerbation ([place])  Acute exacerbation resolved. Keep appt with Carla Dawson. No changes in Advair 250-50 mcg bid.   Labs done during recent hospitalization.According to daughter,there were not recommendations about following on specific labs.So will try to obtain records,released form to sign.     -[name] was advised to return sooner than planned today if new concerns arise.       Carla G. Martinique, MD  [facility]. [place] office.

## 2017-02-24 NOTE — Patient Instructions (Signed)

## 2017-02-25 ENCOUNTER — Ambulatory Visit: Payer: Medicare Other | Admitting: Family Medicine

## 2017-02-25 ENCOUNTER — Encounter: Payer: Self-pay | Admitting: Family Medicine

## 2017-02-25 ENCOUNTER — Ambulatory Visit (INDEPENDENT_AMBULATORY_CARE_PROVIDER_SITE_OTHER): Payer: Medicare Other | Admitting: Family Medicine

## 2017-02-25 VITALS — BP 124/64 | HR 84 | Resp 12 | Ht 61.0 in | Wt 97.1 lb

## 2017-02-25 DIAGNOSIS — I70213 Atherosclerosis of native arteries of extremities with intermittent claudication, bilateral legs: Secondary | ICD-10-CM

## 2017-02-25 DIAGNOSIS — I739 Peripheral vascular disease, unspecified: Secondary | ICD-10-CM

## 2017-02-25 DIAGNOSIS — G63 Polyneuropathy in diseases classified elsewhere: Secondary | ICD-10-CM

## 2017-02-25 DIAGNOSIS — I10 Essential (primary) hypertension: Secondary | ICD-10-CM | POA: Diagnosis not present

## 2017-02-25 DIAGNOSIS — J441 Chronic obstructive pulmonary disease with (acute) exacerbation: Secondary | ICD-10-CM

## 2017-02-25 NOTE — Patient Instructions (Signed)
A few things to remember from today's visit:   Neuropathy due to peripheral vascular disease ([place])  Essential hypertension, benign   Please be sure medication list is accurate. If a new problem present, please set up appointment sooner than planned today.

## 2017-02-25 NOTE — Progress Notes (Signed)
Pre visit review using our clinic review tool, if applicable. No additional management support is needed unless otherwise documented below in the visit note. 

## 2017-02-27 ENCOUNTER — Ambulatory Visit (INDEPENDENT_AMBULATORY_CARE_PROVIDER_SITE_OTHER): Payer: Medicare Other | Admitting: Internal Medicine

## 2017-02-27 ENCOUNTER — Ambulatory Visit (INDEPENDENT_AMBULATORY_CARE_PROVIDER_SITE_OTHER)
Admission: RE | Admit: 2017-02-27 | Discharge: 2017-02-27 | Disposition: A | Discharge status: Home / Self Care | Payer: Medicare Other | Source: Ambulatory Visit | Attending: Internal Medicine | Admitting: Internal Medicine

## 2017-02-27 ENCOUNTER — Encounter: Payer: Self-pay | Admitting: Internal Medicine

## 2017-02-27 VITALS — BP 142/60 | HR 75 | Ht 61.0 in | Wt 96.8 lb

## 2017-02-27 DIAGNOSIS — J449 Chronic obstructive pulmonary disease, unspecified: Secondary | ICD-10-CM

## 2017-02-27 DIAGNOSIS — I70213 Atherosclerosis of native arteries of extremities with intermittent claudication, bilateral legs: Secondary | ICD-10-CM

## 2017-02-27 MED ORDER — FLUTICASONE FUROATE-VILANTEROL 100-25 MCG/INH IN AEPB: 1.0000 | INHALATION_SPRAY | Freq: Every day | RESPIRATORY_TRACT | 11 refills | Status: DC

## 2017-02-27 MED ORDER — FLUTICASONE FUROATE-VILANTEROL 100-25 MCG/INH IN AEPB: 1.0000 | INHALATION_SPRAY | Freq: Every day | RESPIRATORY_TRACT | 0 refills | Status: DC

## 2017-02-27 NOTE — Progress Notes (Signed)
Subjective:    Patient ID: Carla Dawson, female    DOB: [birth year],    MRN: [id number]     Brief patient profile:  28 yowf  Quit smoking 2007 with dx of copd in 2002 while living in [place] rx with Advair then Breo since quit smoking referred to pulmonary clinic 10/25/2016 by Dr   Betty Martinique   History of Present Illness  10/25/2016 1st [facility] office visit/ Carla Dawson   Chief Complaint  Patient presents with  . Pulmonary Consult    Referred by Dr. Betty Martinique.  Pt here to est care for COPD. She c/o SOB with walking approx 800 ft on a flat surface, eating and walking up an incline. She has some PND but not coughing much.    MMRC2 = can't walk a nl pace on a flat grade s sob but does fine slow and flat eg walking up to 800 ft - she measures the distances herself to keep tack as also limited by claudication - rarely feels need for saba  rec Work on inhaler technique:  relax and gently blow all the way out then take a nice smooth deep breath back in, triggering the inhaler at same time you start breathing in.  Hold for up to 5 seconds if you can. Blow out thru nose. Rinse and gargle with water when done Plan A = Automatic = Symbicort 160 Take 2 puffs first thing in am and then another 2 puffs about 12 hours later.  Plan B = Backup Only use your albuterol (proair) as a rescue medication Plan C = Crisis - only use your albuterol nebulizer if you first try Plan B and it fails to help > ok to use the nebulizer up to every 4 hours but if start needing it regularly call for immediate appointment   02/27/2017  f/u ov/Isiah Scheel re:  Copd / 02 prn maint on advair 250 bid and prn saba  Chief Complaint  Patient presents with  . Follow-up    Pt states was hospitalized while in [place] early March 2018. She states she was told that she may have had the beginning of PNA. She states today her breathing is "great". She has occ non prod cough. She was sent home with o2, but has not used in the  past several days.   breathing worse suddenly while in [place] not responsive  To albuterol or 2lpm > admit x 4 days and back to baseline  MMRC2 = can't walk a nl pace on a flat grade s sob but does fine slow and flat   No obvious day to day or daytime variability or assoc excess/ purulent sputum or mucus plugs or hemoptysis or cp or chest tightness, subjective wheeze or overt sinus or hb symptoms. No unusual exp hx or h/o childhood pna/ asthma or knowledge of premature birth.  Sleeping ok without nocturnal  or early am exacerbation  of respiratory  c/o's or need for noct saba. Also denies any obvious fluctuation of symptoms with weather or environmental changes or other aggravating or alleviating factors except as outlined above   Current Medications, Allergies, Complete Past Medical History, Past Surgical History, Family History, and Social History were reviewed in [employer] record.  ROS  The following are not active complaints unless bolded sore throat, dysphagia, dental problems, itching, sneezing,  nasal congestion or excess/ purulent secretions, ear ache,   fever, chills, sweats, unintended wt loss, classically pleuritic or exertional cp,  orthopnea  pnd or leg swelling, presyncope, palpitations, abdominal pain, anorexia, nausea, vomiting, diarrhea  or change in bowel or bladder habits, change in stools or urine, dysuria,hematuria,  rash, arthralgias, visual complaints, headache, numbness, weakness or ataxia or problems with walking or coordination,  change in mood/affect or memory.               Objective:   Physical Exam  Hoarse amb wf nad    02/27/2017      96  10/25/16 92 lb 9.6 oz (42 kg)  10/15/16 93 lb 9 oz (42.4 kg)  10/14/16 93 lb (42.2 kg)    Vital signs reviewed  - Note on arrival 02 sats  92% on RA     HEENT: nl dentition, turbinates, and oropharynx. Nl external ear canals without cough reflex   NECK :  without JVD/Nodes/TM/ nl carotid  upstrokes bilaterally   LUNGS: no acc muscle use, barrel chest/ distant bs bilaterally  CV:  RRR  no s3 or murmur or increase in P2, no edema   ABD:  soft and nontender with pos late insp hoover's in supine position. No bruits or organomegaly, bowel sounds nl  MS:  Nl gait/ ext warm without deformities, calf tenderness, cyanosis or clubbing No obvious joint restrictions   SKIN: warm and dry without lesions    NEURO:  alert, approp, nl sensorium with  no motor deficits      CXR PA and Lateral:   02/27/2017 :    I personally reviewed images and agree with radiology impression as follows:   Mild hyperexpansion of the lungs is noted consistent with chronic obstructive pulmonary disease. Stable dense nodular density seen in right lung apex most consistent with calcified granuloma. Aortic atherosclerosis     Assessment & Plan:

## 2017-02-27 NOTE — Progress Notes (Signed)
Spoke with pt and notified of results per Dr. Wert. Pt verbalized understanding and denied any questions. 

## 2017-02-27 NOTE — Assessment & Plan Note (Signed)
10/25/2016  try symbicort 160 2bid instead  of breo > preferred breo - 02/27/2017  After extensive coaching HFA effectiveness =    25% with hfa/ 75% with dpi so try back to [facility]  Placed back on advair during recent admit with ? pna but prefers BREO as only has to remember it once a day but even with BREO struggles with using the elipta device correctly as above  For now rec continue just BREO and prn saba, consider PROAIR respick at next refill so all her inhalers are DPI  I had an extended discussion with the patient and fm reviewing all relevant studies completed to date and  lasting 15 to 20 minutes of a 25 minute visit    Each maintenance medication was reviewed in detail including most importantly the difference between maintenance and prns and under what circumstances the prns are to be triggered using an action plan format that is not reflected in the computer generated alphabetically organized AVS.    Please see AVS for specific instructions unique to this visit that I personally wrote and verbalized to the the pt in detail and then reviewed with pt  by my nurse highlighting any  changes in therapy recommended at today's visit to their plan of care.

## 2017-02-27 NOTE — Patient Instructions (Signed)
Plan A = Automatic = Breo one click each am > take two good deep drags off it but click it just once   Plan B = Backup Only use your albuterol (PROAIR) as a rescue medication to be used if you can't catch your breath by resting or doing a relaxed purse lip breathing pattern.  - The less you use it, the better it will work when you need it. - Ok to use the inhaler up to 2 puffs  every 4 hours if you must but call for appointment if use goes up over your usual need - Don't leave home without it !!  (think of it like the spare tire for your car)   Plan C = Crisis - only use your albuterol nebulizer if you first try Plan B and it fails to help > ok to use the nebulizer up to every 4 hours but if start needing it regularly call for immediate appointment  Please remember to go to the   x-ray department downstairs in the basement  for your tests - we will call you with the results when they are available.      Keep appt for April - call sooner if needed

## 2017-03-03 ENCOUNTER — Ambulatory Visit: Payer: Medicare Other | Admitting: Family Medicine

## 2017-03-04 ENCOUNTER — Ambulatory Visit (HOSPITAL_COMMUNITY)
Admission: RE | Admit: 2017-03-04 | Discharge: 2017-03-04 | Disposition: A | Discharge status: Home / Self Care | Payer: Medicare Other | Source: Ambulatory Visit | Attending: Surgery | Admitting: Surgery

## 2017-03-04 ENCOUNTER — Encounter (HOSPITAL_COMMUNITY)
Admission: RE | Disposition: A | Discharge status: Home / Self Care | Payer: Self-pay | Source: Ambulatory Visit | Attending: Surgery

## 2017-03-04 DIAGNOSIS — L97529 Non-pressure chronic ulcer of other part of left foot with unspecified severity: Secondary | ICD-10-CM | POA: Diagnosis not present

## 2017-03-04 DIAGNOSIS — I10 Essential (primary) hypertension: Secondary | ICD-10-CM | POA: Insufficient documentation

## 2017-03-04 DIAGNOSIS — J449 Chronic obstructive pulmonary disease, unspecified: Secondary | ICD-10-CM | POA: Insufficient documentation

## 2017-03-04 DIAGNOSIS — Z8249 Family history of ischemic heart disease and other diseases of the circulatory system: Secondary | ICD-10-CM | POA: Insufficient documentation

## 2017-03-04 DIAGNOSIS — T82856A Stenosis of peripheral vascular stent, initial encounter: Secondary | ICD-10-CM | POA: Diagnosis not present

## 2017-03-04 DIAGNOSIS — I70245 Atherosclerosis of native arteries of left leg with ulceration of other part of foot: Secondary | ICD-10-CM | POA: Insufficient documentation

## 2017-03-04 DIAGNOSIS — Y831 Surgical operation with implant of artificial internal device as the cause of abnormal reaction of the patient, or of later complication, without mention of misadventure at the time of the procedure: Secondary | ICD-10-CM | POA: Diagnosis not present

## 2017-03-04 DIAGNOSIS — M1990 Unspecified osteoarthritis, unspecified site: Secondary | ICD-10-CM | POA: Diagnosis not present

## 2017-03-04 DIAGNOSIS — Z7951 Long term (current) use of inhaled steroids: Secondary | ICD-10-CM | POA: Insufficient documentation

## 2017-03-04 DIAGNOSIS — I739 Peripheral vascular disease, unspecified: Secondary | ICD-10-CM | POA: Diagnosis present

## 2017-03-04 DIAGNOSIS — Z7902 Long term (current) use of antithrombotics/antiplatelets: Secondary | ICD-10-CM | POA: Insufficient documentation

## 2017-03-04 DIAGNOSIS — Z87891 Personal history of nicotine dependence: Secondary | ICD-10-CM | POA: Diagnosis not present

## 2017-03-04 DIAGNOSIS — I70221 Atherosclerosis of native arteries of extremities with rest pain, right leg: Secondary | ICD-10-CM | POA: Diagnosis not present

## 2017-03-04 HISTORY — PX: ABDOMINAL AORTOGRAM W/LOWER EXTREMITY: CATH118223

## 2017-03-04 HISTORY — PX: PERIPHERAL VASCULAR BALLOON ANGIOPLASTY: CATH118281

## 2017-03-04 LAB — POCT I-STAT, CHEM 8
BUN: 14 mg/dL (ref 6–20)
CALCIUM ION: 1.24 mmol/L (ref 1.15–1.40)
CHLORIDE: 101 mmol/L (ref 101–111)
Creatinine, Ser: 0.6 mg/dL (ref 0.44–1.00)
Glucose, Bld: 89 mg/dL (ref 65–99)
HEMATOCRIT: 37 % (ref 36.0–46.0)
Hemoglobin: 12.6 g/dL (ref 12.0–15.0)
POTASSIUM: 3.8 mmol/L (ref 3.5–5.1)
SODIUM: 139 mmol/L (ref 135–145)
TCO2: 30 mmol/L (ref 0–100)

## 2017-03-04 LAB — POCT ACTIVATED CLOTTING TIME
ACTIVATED CLOTTING TIME: 202 s
ACTIVATED CLOTTING TIME: 191 s
Activated Clotting Time: 202 seconds
Activated Clotting Time: 208 seconds
Activated Clotting Time: 241 seconds
Activated Clotting Time: 180 seconds

## 2017-03-04 SURGERY — ABDOMINAL AORTOGRAM W/LOWER EXTREMITY
Anesthesia: LOCAL

## 2017-03-04 MED ORDER — HEPARIN (PORCINE) IN NACL 2-0.9 UNIT/ML-% IJ SOLN
INTRAMUSCULAR | Status: AC
  Filled 2017-03-04: qty 1000

## 2017-03-04 MED ORDER — PHENOL 1.4 % MT LIQD: 1.0000 | OROMUCOSAL | Status: DC | PRN

## 2017-03-04 MED ORDER — ONDANSETRON HCL 4 MG/2ML IJ SOLN: 4.0000 mg | Freq: Four times a day (QID) | INTRAMUSCULAR | Status: DC | PRN

## 2017-03-04 MED ORDER — DOCUSATE SODIUM 100 MG PO CAPS: 100.0000 mg | ORAL_CAPSULE | Freq: Every day | ORAL | Status: DC

## 2017-03-04 MED ORDER — LIDOCAINE HCL (PF) 1 % IJ SOLN
INTRAMUSCULAR | Status: DC | PRN
  Administered 2017-03-04: 15 mL via SUBCUTANEOUS

## 2017-03-04 MED ORDER — ALUM & MAG HYDROXIDE-SIMETH 200-200-20 MG/5ML PO SUSP: 15.0000 mL | ORAL | Status: DC | PRN

## 2017-03-04 MED ORDER — HEPARIN SODIUM (PORCINE) 1000 UNIT/ML IJ SOLN
INTRAMUSCULAR | Status: DC | PRN
  Administered 2017-03-04: 4000 [IU] via INTRAVENOUS
  Administered 2017-03-04: 2000 [IU] via INTRAVENOUS
  Administered 2017-03-04: 1000 [IU] via INTRAVENOUS

## 2017-03-04 MED ORDER — LIDOCAINE HCL (PF) 1 % IJ SOLN
INTRAMUSCULAR | Status: AC
  Filled 2017-03-04: qty 30

## 2017-03-04 MED ORDER — HYDRALAZINE HCL 20 MG/ML IJ SOLN: 5.0000 mg | INTRAMUSCULAR | Status: DC | PRN

## 2017-03-04 MED ORDER — SODIUM CHLORIDE 0.9 % IV SOLN: 1.0000 mL/kg/h | INTRAVENOUS | Status: DC

## 2017-03-04 MED ORDER — MORPHINE SULFATE (PF) 10 MG/ML IV SOLN: 2.0000 mg | INTRAVENOUS | Status: DC | PRN

## 2017-03-04 MED ORDER — FENTANYL CITRATE (PF) 100 MCG/2ML IJ SOLN
INTRAMUSCULAR | Status: DC | PRN
  Administered 2017-03-04 (×3): 25 ug via INTRAVENOUS

## 2017-03-04 MED ORDER — FENTANYL CITRATE (PF) 100 MCG/2ML IJ SOLN
INTRAMUSCULAR | Status: AC
  Filled 2017-03-04: qty 2

## 2017-03-04 MED ORDER — ATORVASTATIN CALCIUM 10 MG PO TABS: 10.0000 mg | ORAL_TABLET | Freq: Every day | ORAL | 5 refills | Status: DC

## 2017-03-04 MED ORDER — OXYCODONE HCL 5 MG PO TABS
ORAL_TABLET | ORAL | Status: AC
  Administered 2017-03-04: 5 mg via ORAL
  Filled 2017-03-04: qty 1

## 2017-03-04 MED ORDER — LABETALOL HCL 5 MG/ML IV SOLN: 10.0000 mg | INTRAVENOUS | Status: DC | PRN

## 2017-03-04 MED ORDER — MIDAZOLAM HCL 2 MG/2ML IJ SOLN
INTRAMUSCULAR | Status: DC | PRN
  Administered 2017-03-04: 1 mg via INTRAVENOUS
  Administered 2017-03-04 (×2): 0.5 mg via INTRAVENOUS

## 2017-03-04 MED ORDER — SODIUM CHLORIDE 0.9 % IV SOLN
INTRAVENOUS | Status: DC
  Administered 2017-03-04: 06:00:00 via INTRAVENOUS

## 2017-03-04 MED ORDER — OXYCODONE HCL 5 MG PO TABS
5.0000 mg | ORAL_TABLET | ORAL | Status: DC | PRN
  Administered 2017-03-04: 5 mg via ORAL

## 2017-03-04 MED ORDER — IODIXANOL 320 MG/ML IV SOLN
INTRAVENOUS | Status: DC | PRN
  Administered 2017-03-04: 150 mL

## 2017-03-04 MED ORDER — METOPROLOL TARTRATE 5 MG/5ML IV SOLN: 2.0000 mg | INTRAVENOUS | Status: DC | PRN

## 2017-03-04 MED ORDER — GUAIFENESIN-DM 100-10 MG/5ML PO SYRP: 15.0000 mL | ORAL_SOLUTION | ORAL | Status: DC | PRN

## 2017-03-04 MED ORDER — HEPARIN SODIUM (PORCINE) 1000 UNIT/ML IJ SOLN
INTRAMUSCULAR | Status: AC
  Filled 2017-03-04: qty 1

## 2017-03-04 MED ORDER — MIDAZOLAM HCL 2 MG/2ML IJ SOLN
INTRAMUSCULAR | Status: AC
  Filled 2017-03-04: qty 2

## 2017-03-04 SURGICAL SUPPLY — 28 items
BALLN ANGIOSCULPT 4X100 (BALLOONS) ×3
BALLN COYOTE ES OTW 2X20X142 (BALLOONS) ×3
BALLOON ANGIOSCULPT 4X100 (BALLOONS) ×2 IMPLANT
BALLOON COYOTE ES OTW 2X20X142 (BALLOONS) ×2 IMPLANT
CATH OMNI FLUSH 5F 65CM (CATHETERS) ×3 IMPLANT
CATH SOFT-VU ST 4F 90CM (CATHETERS) ×3 IMPLANT
COVER PRB 48X5XTLSCP FOLD TPE (BAG) ×2 IMPLANT
COVER PROBE 5X48 (BAG) ×1
DEVICE TORQUE .014-.018 (MISCELLANEOUS) ×2 IMPLANT
DEVICE TORQUE H2O (MISCELLANEOUS) ×3 IMPLANT
DRAPE ZERO GRAVITY STERILE (DRAPES) ×3 IMPLANT
GUIDEWIRE ANGLED .035X260CM (WIRE) ×3 IMPLANT
GUIDEWIRE STR TIP .014X300X8 (WIRE) ×3 IMPLANT
KIT ENCORE 26 ADVANTAGE (KITS) ×3 IMPLANT
KIT MICROINTRODUCER STIFF 5F (SHEATH) ×3 IMPLANT
KIT PV (KITS) ×3 IMPLANT
SHEATH PINNACLE 5F 10CM (SHEATH) ×3 IMPLANT
SHEATH PINNACLE MP 6F 45CM (SHEATH) ×3 IMPLANT
SYR MEDRAD MARK V 150ML (SYRINGE) ×3 IMPLANT
TAPE VIPERTRACK RADIOPAQ (MISCELLANEOUS) ×2 IMPLANT
TAPE VIPERTRACK RADIOPAQUE (MISCELLANEOUS) ×1
TORQUE DEVICE .014-.018 (MISCELLANEOUS) ×3
TRANSDUCER W/STOPCOCK (MISCELLANEOUS) ×3 IMPLANT
TRAY PV CATH (CUSTOM PROCEDURE TRAY) ×3 IMPLANT
WIRE BENTSON .035X145CM (WIRE) ×3 IMPLANT
WIRE HI TORQ VERSACORE J 260CM (WIRE) ×3 IMPLANT
WIRE ROSEN-J .035X180CM (WIRE) ×3 IMPLANT
WIRE SPARTACORE .014X300CM (WIRE) ×3 IMPLANT

## 2017-03-04 NOTE — Progress Notes (Signed)
37fr sheath aspirated and removed from rfa. Manual pressure applied for 20 minutes, groin level 0 no s+s of hematoma. Tegaderm dressing applied, bedrest instructions given.  Bedrest begins at 11:35:00

## 2017-03-04 NOTE — Interval H&P Note (Signed)
History and Physical Interval Note:  03/04/2017 7:26 AM  Carla Dawson  has presented today for surgery, with the diagnosis of pvd w/ left great toe ulcer  The various methods of treatment have been discussed with the patient and family. After consideration of risks, benefits and other options for treatment, the patient has consented to  Procedure(s): Abdominal Aortogram w/Lower Extremity (N/A) as a surgical intervention .  The patient's history has been reviewed, patient examined, no change in status, stable for surgery.  I have reviewed the patient's chart and labs.  Questions were answered to the patient's satisfaction.     [name]  Discussed with patient that she has stenosis in her left leg stents.  We are doing an angiogram to better evaluate this and to intervene if needed.  She is in agreement  WB

## 2017-03-04 NOTE — Discharge Instructions (Signed)

## 2017-03-04 NOTE — H&P (View-Only) (Signed)
[facility] [place]   CC: Follow up peripheral artery occlusive disease  History of Present Illness Carla Dawson is a 83 y.o. female patient of Dr. Trula Slade with a history of right lower extremity rest pain due to sever PAD.  Most recently she under went revascularization of the right LE femoral to posterior tibial bypass with saphenous vein.  She had bilateral femoral pulses and no history of ulcers.  She has had multiple procedures on bilateral LE for her symptoms of rest pain.  Starting in Sept. 2016: Left Fem-pop atherectomy, left ATA pta, stent, pta.  Right Artherectomy tpa femoral stent and then right fem-PT trunk bypass. Both procedures were performed in [place], [place].   She was staying with her daughter half of the year and back to [place] half the year, but has moved to this area permanently. She has no rest pain.  She states that her feet do feel strange " like there is sand between her toes.  She takes ultram at night to help her rest.    Past medical history:  COPD managed with inhalers, Plavix daily s/p stent placement, and HTN managed with Norvasc.  She states she has no history of CAD or DM.    Pt was last evaluated on 10-14-16 by Dr. Trula Slade and Jerilynn Mages. [employer]. At that time, within the prior year, pt had had bilateral rest pain, treated in [place].  She had undergone percutaneous revascularization of the right leg which was complicated by acute occlusion as well as perforation requiring covered stenting.  This ultimately led to surgical revascularization which appeared to be a right femoral to tibioperoneal trunk bypass graft.  On ultrasound that was widely patent.  On the left leg the patient had also undergone multiple percutaneous interventions including atherectomy of the left superficial femoral and popliteal artery as well as angioplasty of the anterior tibial artery.  Ultrasound that day showed no significant stenosis.  The patient appeared to be  relatively pain free other than describing what sounded like bilateral neuropathic pain.  She occasionally will take Ultram at night to help with her symptoms.  Dr. Trula Slade recommended trying Neurontin as this sounded like neuropathy.  No acute intervention was warranted at that time but rather surveillance protocol with the next imaging study in 6 months.  She returns today after rec'd call from pt's daughter.  Requested to move pt's f/u appt. to an earlier date.  Stated she has a sore on one of her great toes.  Stated she thinks it is the left gr. Toe.  Reported the sore has been slow to heal, but is looking better; denied any drainage at this time.  Reported the area "had been split open, but looks like it is healing now."  Stated "I would feel better to get her in sooner and get the process started".  In the last 1-2 months she has developed left calf claudication after walking about "600 steps" and painful left toes at rest.   She denies any known history of stroke.  Pt Diabetic: No Pt smoker: former smoker, quit in 2007, smoked x 52 years  Pt meds include: Statin :No Betablocker: No ASA: No Other anticoagulants/antiplatelets: Plavix   Past Medical History:  Diagnosis Date  . Allergy   . Arthritis   . Chicken pox   . Chronic bronchitis ([place])   . Hypertension     Social History Social History  Substance Use Topics  . Smoking status: Former Smoker  Packs/day: 1.50    Years: 52.00    Start date: 12/09/1953    Quit date: 12/09/2005  . Smokeless tobacco: Never Used  . Alcohol use No    Family History Family History  Problem Relation Age of Onset  . Arthritis Mother   . Hypertension Mother   . Arthritis Father   . Hypertension Father   . Cancer Brother   . Hypertension Son     Past Surgical History:  Procedure Laterality Date  . ABDOMINAL HYSTERECTOMY    . VEIN BYPASS SURGERY      Allergies  Allergen Reactions  . Lortab [Hydrocodone-Acetaminophen]     Breathing  issues; itching; took benedryl     Current Outpatient Prescriptions  Medication Sig Dispense Refill  . amLODipine (NORVASC) 5 MG tablet Take 5 mg by mouth daily.    . cetirizine (ZYRTEC) 10 MG tablet Take 10 mg by mouth daily.    . clopidogrel (PLAVIX) 75 MG tablet Take 1 tablet (75 mg total) by mouth daily. 90 tablet 1  . Fiber, Guar Gum, CHEW Chew by mouth daily.    . Fluticasone-Salmeterol (ADVAIR DISKUS) 250-50 MCG/DOSE AEPB Inhale 1 puff into the lungs 2 (two) times daily.    . Multiple Vitamins-Minerals (SENIOR MULTIVITAMIN PLUS) TABS Take 1 tablet by mouth daily.    Marland Kitchen PROAIR HFA 108 (90 Base) MCG/ACT inhaler INHALE 2 PUFFS BY MOUTH EVERY 4 HOURS FOR WHEEZING 8.5 g 1  . dextromethorphan (DELSYM) 30 MG/5ML liquid Take by mouth as needed for cough.    . gabapentin (NEURONTIN) 300 MG capsule Take 1 capsule (300 mg total) by mouth daily. (Patient not taking: Reported on 02/24/2017) 30 capsule 2   No current facility-administered medications for this visit.     ROS: See HPI for pertinent positives and negatives.   Physical Examination  Vitals:   02/24/17 1115  BP: 135/66  Pulse: 70  Resp: 20  Temp: 97.6 F (36.4 C)  TempSrc: Oral  SpO2: 96%  Weight: 96 lb 4.8 oz (43.7 kg)  Height: 5\' 1"  (1.549 m)   Body mass index is 18.2 kg/m.  General: A&O x 3, WDWN, thin elderly female. Gait: normal Eyes: PERRLA. Pulmonary: Respirations are somewhat labored at rest, occasional cough, limited air movement, no rales, rhonchi, or wheezes.  Cardiac: regular rhythm, no detected murmur.         Carotid Bruits Right Left   Negative Negative  Aorta is not palpable. Radial pulses: 2+ palpable and =                           VASCULAR EXAM: Extremities without ischemic changes, without Gangrene; without open wounds. Feet are cool to touch and moderately painful to touch. Healed lesions at left heal and great toe.                                                                                                            LE Pulses Right Left       FEMORAL  palpable   palpable        POPLITEAL  not palpable   not palpable       POSTERIOR TIBIAL  not palpable   not palpable        DORSALIS PEDIS      ANTERIOR TIBIAL not palpable  not palpable    Abdomen: soft, NT, no palpable masses. Skin: no rashes, no ulcers noted. Musculoskeletal: Age appropriate muscle wasting and atrophy. Moderate kyphosis.   Neurologic: A&O X 3; Appropriate Affect, MOTOR FUNCTION:  moving all extremities equally, motor strength 4/5 throughout. Speech is fluent/normal. CN 2-12 intact.    ASSESSMENT: Carla Dawson is a 83 y.o. female who presents with: worsening rest pain in the left toes and new claudication in the left calf after 600 steps.   She is s/p left Fem-pop atherectomy, left ATA pta, stent, pta.  Right Artherectomy tpa femoral stent and then right fem-PT trunk bypass. Both procedures were performed in [place], [place], about 2016 and 2017.    In the last 1-2 months she has developed left calf claudication after walking about "600 steps" and painful left toes at rest.  She continues to have right foot pain to touch.  Left leg has new and worsening claudication with corresponding high velocity of 511 cm/s proximal to left SFA stent. Dr. Trula Slade will access the right groin, intervene on the left leg; see Plan.  DATA Today's bilateral LE arterial duplex demonstrates a widely patent right femorotibial artery bypass graft with no stenosis noted in the graft; however, there is disease and elevated velocities in the of the native inflow artery (50-74%). Significant stenosis (75-99%) involving the inflow of the left SFA stent (511 cm/s) and native vessel distal to the stent (150 cm/s) (30-49%).  Hyperplasia noted within the stent without significant changes.   ABI: Right: 0.92 ( 0.81, 10-11-16); waveforms: PT: triphasic, DP: biphasic; TBI: dampened.  Left: 0.48 (0.83, 10-11-16), waveforms:  monophasic; TBI: no waveform detected.  Right ABI has improved slightly, left has declined significantly.   PLAN:  Based on the patient's vascular studies and examination, and after discussing with Dr. Trula Slade, pt will be scheduled for an arteriogram with bilateral run off, possible intervention, on 03-04-17, by Dr. Trula Slade.   I discussed in depth with the patient the nature of atherosclerosis, and emphasized the importance of maximal medical management including strict control of blood pressure, blood glucose, and lipid levels, obtaining regular exercise, and continued cessation of smoking.  The patient is aware that without maximal medical management the underlying atherosclerotic disease process will progress, limiting the benefit of any interventions.  The patient was given information about PAD including signs, symptoms, treatment, what symptoms should prompt the patient to seek immediate medical care, and risk reduction measures to take.  Clemon Chambers, RN, MSN, FNP-C [facility] Phone: [phone]  Clinic MD: Trula Slade  02/24/17 11:45 AM

## 2017-03-04 NOTE — Op Note (Signed)
Patient name: Carla Dawson MRN: [id number] DOB: December 27, 1930 Sex: female  03/04/2017 Pre-operative Diagnosis: Left foot ulcer Post-operative diagnosis:  Same Surgeon:  [name] Procedure Performed:  1.  Ultrasound-guided access, right femoral artery  2.  Abdominal aortogram  3.  Right lower extremity runoff  4.  Left lower extremity runoff  5.  Conscious sedation (90 minutes)  6.  Angioplasty, left superficial femoral and popliteal artery  7.  Angioplasty, left tibioperoneal trunk and posterior tibial artery  8.  Additional order catheterization   Indications:  The patient has previously undergone percutaneous revascularization of the left leg and surgical revascularization of the right leg, all performed in [place].  She recently developed pain in her left foot as well as a small opening.  Ultrasound identified elevated velocities in her left leg therefore she comes in today for arteriogram and possible intervention.  Procedure:  The patient was identified in the holding area and taken to room 8.  The patient was then placed supine on the table and prepped and draped in the usual sterile fashion.  Conscious sedation was administered with the use of IV fentanyl and Versed in a continuous physician and nurse monitoring.  Heart rate, blood pressure, and oxygen saturations were continuously monitored.  A time out was called.  Ultrasound was used to evaluate the right common femoral artery.  It was patent .  A digital ultrasound image was acquired.  A micropuncture needle was used to access the right common femoral artery under ultrasound guidance.  An 018 wire was advanced without resistance and a micropuncture sheath was placed.  The 018 wire was removed and a benson wire was placed.  The micropuncture sheath was exchanged for a 5 french sheath.  An omniflush catheter was advanced over the wire to the level of L-1.  An abdominal angiogram was obtained.  Next, using the omniflush  catheter and a benson wire, the aortic bifurcation was crossed and the catheter was placed into theleft external iliac artery and left runoff was obtained.  right runoff was performed via retrograde sheath injections.  Findings:   Aortogram:  No significant renal artery stenosis is identified.  Interestingly, her right kidney sits just above the pelvis.  The infrarenal abdominal aorta is small in caliber but patent without stenosis.  There is luminal irregularity on the left lateral side above the bifurcation.  No significant common or external iliac artery stenosis bilaterally.  Right Lower Extremity:  The right common femoral and profunda femoral artery are patent.  Bypass graft is visualized the common femoral artery down to the tibioperoneal trunk.  There is single vessel runoff via the peroneal artery.  The bypass graft is widely patent.  The superficial femoral artery and associated stents are occluded.  Left Lower Extremity:  Left common femoral profunda femoral artery are patent throughout the course.  The superficial femoral artery is small in caliber and diffusely diseased.  There are multiple areas of greater than 80% stenosis within the stents which are in the mid superficial femoral and proximal popliteal artery.  There is also native vessel disease below the stents down to the level of the patella.  The below knee popliteal artery is patent throughout it's course.  The dominant runoff vessel is the posterior tibial artery.  Intervention:  After the above images were acquired the decision was made to proceed with intervention.  A 6 French 45 cm sheath was advanced into the left external iliac artery.  Using a Glidewire  and a 90 cm 4 French straight catheter, I was able to cross the lesions within the superficial femoral and popliteal artery.  The catheter was in position down at the level of the patella.  A contrast injection was performed at this level to confirm successful crossing.  This  confirmed that I had successfully crossed lesion however there was a filling defect within the tibioperoneal trunk.  At this time and a proceeding with intervention.  A 014 [place] core wire was placed.  I then perform balloon angioplasty of the left superficial femoral and popliteal artery using a 4 x 100 Angiosculpt balloon.  3 separate inflations were performed for total treatment length of approximately 25 cm.  A completion imaging study was performed which showed resolution of the stenosis.  I had originally planned on drug coated balloon angioplasty following this, however with results obtained I elected not to do this but rather focused my attention on the tibial vessels as they're now did not appear to be a filling defect in the proximal posterior tibial artery but rather a area of luminal narrowing.  I used a V 14 wire and a 2 x 2 coyote balloon.  I was able to get wire access into the posterior tibial artery.  I then perform balloon angioplasty of the tibioperoneal trunk and posterior tibial artery taking the balloon to nominal pressure for 2 minutes.  Completion imaging was then performed which showed no stenosis at this level.  The dominant runoff vessel across the ankle was the posterior tibial artery.  At this point the decision was made to terminate the procedure.  Catheters and wires were removed.  The patient taken the holding area for sheath pull once her platelets profile corrects.  Impression:  #1  patent right femoral to tibioperoneal trunk bypass with single-vessel runoff via the peroneal artery  #2  diffuse, greater than 80% stenosis within the left superficial femoral and popliteal artery and its associated stents.  This was treated primarily with a 4 x 100 Angiosculpt balloon with residual stenosis less than 10%  #3  luminal irregularity associated with approximately 70% stenosis at the origin of the posterior tibial artery.  This was successfully angioplastied with a 2 mm balloon with  no residual stenosis.  #4  dominant runoff vessel to the left foot is via the posterior tibial artery.   Theotis Burrow, M.D. [facility] [place] Office: [phone] Pager:  [phone]

## 2017-03-05 ENCOUNTER — Ambulatory Visit: Payer: Medicare Other | Admitting: Family

## 2017-03-05 ENCOUNTER — Encounter (HOSPITAL_COMMUNITY): Payer: Self-pay | Admitting: Surgery

## 2017-03-05 ENCOUNTER — Encounter (HOSPITAL_COMMUNITY): Payer: Medicare Other

## 2017-03-05 MED FILL — Heparin Sodium (Porcine) 2 Unit/ML in Sodium Chloride 0.9%: INTRAMUSCULAR | Qty: 1000 | Status: AC

## 2017-03-06 ENCOUNTER — Telehealth: Payer: Self-pay | Admitting: Surgery

## 2017-03-06 NOTE — Telephone Encounter (Signed)
-----   Message from Mena Goes, RN sent at 03/04/2017  1:06 PM EDT ----- Regarding: 1 month   ----- Message ----- From: Serafina Mitchell, MD Sent: 03/04/2017   9:18 AM To: [facility] Charge Pool  03/04/2017:  Surgeon:  [name] Procedure Performed:  1.  Ultrasound-guided access, right femoral artery  2.  Abdominal aortogram  3.  Right lower extremity runoff  4.  Left lower extremity runoff  5.  Conscious sedation (90 minutes)  6.  Angioplasty, left superficial femoral and popliteal artery  7.  Angioplasty, left tibioperoneal trunk and posterior tibial artery  8.  Additional order catheterization   Follow-up one month with ABIs and duplex of the left leg and 2 see Vinnie Level

## 2017-03-06 NOTE — Telephone Encounter (Signed)
LVM on home # for appt, req call back to confirm, mailed lttr 5/11 for [place] and OV

## 2017-03-18 DIAGNOSIS — L570 Actinic keratosis: Secondary | ICD-10-CM | POA: Diagnosis not present

## 2017-03-18 DIAGNOSIS — L820 Inflamed seborrheic keratosis: Secondary | ICD-10-CM | POA: Diagnosis not present

## 2017-03-31 ENCOUNTER — Encounter (HOSPITAL_COMMUNITY): Payer: Medicare Other

## 2017-03-31 ENCOUNTER — Ambulatory Visit: Payer: Medicare Other | Admitting: Surgery

## 2017-04-01 ENCOUNTER — Ambulatory Visit: Payer: Medicare Other | Admitting: Family Medicine

## 2017-04-03 ENCOUNTER — Encounter: Payer: Self-pay | Admitting: Internal Medicine

## 2017-04-03 ENCOUNTER — Ambulatory Visit (INDEPENDENT_AMBULATORY_CARE_PROVIDER_SITE_OTHER): Payer: Medicare Other | Admitting: Internal Medicine

## 2017-04-03 ENCOUNTER — Ambulatory Visit: Payer: Medicare Other | Admitting: Internal Medicine

## 2017-04-03 VITALS — BP 140/60 | HR 74 | Ht 61.5 in | Wt 100.0 lb

## 2017-04-03 DIAGNOSIS — Z23 Encounter for immunization: Secondary | ICD-10-CM

## 2017-04-03 DIAGNOSIS — R938 Abnormal findings on diagnostic imaging of other specified body structures: Secondary | ICD-10-CM | POA: Diagnosis not present

## 2017-04-03 DIAGNOSIS — I70213 Atherosclerosis of native arteries of extremities with intermittent claudication, bilateral legs: Secondary | ICD-10-CM | POA: Diagnosis not present

## 2017-04-03 DIAGNOSIS — J449 Chronic obstructive pulmonary disease, unspecified: Secondary | ICD-10-CM

## 2017-04-03 DIAGNOSIS — R9389 Abnormal findings on diagnostic imaging of other specified body structures: Secondary | ICD-10-CM

## 2017-04-03 LAB — PULMONARY FUNCTION TEST
DL/VA % pred: 49 %
DL/VA: 2.2 ml/min/mmHg/L
DLCO COR % PRED: 35 %
DLCO COR: 7.38 ml/min/mmHg
DLCO unc % pred: 35 %
DLCO unc: 7.49 ml/min/mmHg
FEF 25-75 Post: 0.24 L/sec
FEF 25-75 Pre: 0.23 L/sec
FEF2575-%Change-Post: 1 %
FEF2575-%PRED-PRE: 21 %
FEF2575-%Pred-Post: 21 %
FEV1-%Change-Post: 12 %
FEV1-%Pred-Post: 37 %
FEV1-%Pred-Pre: 33 %
FEV1-Post: 0.59 L
FEV1-Pre: 0.53 L
FEV1FVC-%CHANGE-POST: -1 %
FEV1FVC-%Pred-Pre: 47 %
FEV6-%Change-Post: 0 %
FEV6-%PRED-PRE: 69 %
FEV6-%Pred-Post: 70 %
FEV6-PRE: 1.42 L
FEV6-Post: 1.43 L
FEV6FVC-%Change-Post: -11 %
FEV6FVC-%Pred-Post: 87 %
FEV6FVC-%Pred-Pre: 99 %
FVC-%Change-Post: 13 %
FVC-%PRED-POST: 80 %
FVC-%PRED-PRE: 70 %
FVC-POST: 1.73 L
FVC-PRE: 1.53 L
POST FEV6/FVC RATIO: 83 %
PRE FEV1/FVC RATIO: 35 %
Post FEV1/FVC ratio: 34 %
Pre FEV6/FVC Ratio: 94 %
RV % pred: 169 %
RV: 3.95 L
TLC % pred: 124 %
TLC: 5.83 L

## 2017-04-03 MED ORDER — PNEUMOCOCCAL 13-VAL CONJ VACC IM SUSP
0.5000 mL | INTRAMUSCULAR | Status: AC
  Administered 2017-04-03: 0.5 mL via INTRAMUSCULAR

## 2017-04-03 NOTE — Progress Notes (Signed)
PFT done today. 

## 2017-04-03 NOTE — Progress Notes (Signed)
Subjective:    Patient ID: Carla Dawson, female    DOB: 01-15-1931,    MRN: [id number]     Brief patient profile:  89 yowf  Quit smoking 2007 with dx of copd in 2002 while living in [place] rx with Advair then Breo since quit smoking referred to pulmonary clinic 10/25/2016 by Dr   Betty Martinique   History of Present Illness  10/25/2016 1st [facility] office visit/ Akshith Moncus   Chief Complaint  Patient presents with  . Pulmonary Consult    Referred by Dr. Betty Martinique.  Pt here to est care for COPD. She c/o SOB with walking approx 800 ft on a flat surface, eating and walking up an incline. She has some PND but not coughing much.    MMRC2 = can't walk a nl pace on a flat grade s sob but does fine slow and flat eg walking up to 800 ft - she measures the distances herself to keep tack as also limited by claudication - rarely feels need for saba  rec Work on inhaler technique:  relax and gently blow all the way out then take a nice smooth deep breath back in, triggering the inhaler at same time you start breathing in.  Hold for up to 5 seconds if you can. Blow out thru nose. Rinse and gargle with water when done Plan A = Automatic = Symbicort 160 Take 2 puffs first thing in am and then another 2 puffs about 12 hours later.  Plan B = Backup Only use your albuterol (proair) as a rescue medication Plan C = Crisis - only use your albuterol nebulizer if you first try Plan B and it fails to help > ok to use the nebulizer up to every 4 hours but if start needing it regularly call for immediate appointment   02/27/2017  f/u ov/[employer]chicchio re:  Copd / 02 prn maint on advair 250 bid and prn saba  Chief Complaint  Patient presents with  . Follow-up    Pt states was hospitalized while in [place] early March 2018. She states she was told that she may have had the beginning of PNA. She states today her breathing is "great". She has occ non prod cough. She was sent home with o2, but has not used in the  past several days.   breathing worse suddenly while in [place] not responsive  To albuterol or 2lpm > admit x 4 days and back to baseline  MMRC2 = can't walk a nl pace on a flat grade s sob but does fine slow and flat  rec Plan A = Automatic = Breo one click each am > take two good deep drags off it but click it just once  Plan B = Backup Only use your albuterol (PROAIR) as a rescue medication Plan C = Crisis - only use your albuterol nebulizer if you first try Plan B     04/03/2017  f/u ov/Lavel Rieman re:  GOLD  III  On Breo daily /  Has 02 doesn't use it  Chief Complaint  Patient presents with  . Follow-up    PFT's done today. Pt states her breathing is doing well. She has not had to use proair at all.   doe = MMRC2  No obvious day to day or daytime variability or assoc excess/ purulent sputum or mucus plugs or hemoptysis or cp or chest tightness, subjective wheeze or overt sinus or hb symptoms. No unusual exp hx or h/o childhood pna/  asthma or knowledge of premature birth.  Sleeping ok without nocturnal  or early am exacerbation  of respiratory  c/o's or need for noct saba. Also denies any obvious fluctuation of symptoms with weather or environmental changes or other aggravating or alleviating factors except as outlined above   Current Medications, Allergies, Complete Past Medical History, Past Surgical History, Family History, and Social History were reviewed in [employer] record.  ROS  The following are not active complaints unless bolded sore throat, dysphagia, dental problems, itching, sneezing,  nasal congestion or excess/ purulent secretions, ear ache,   fever, chills, sweats, unintended wt loss, classically pleuritic or exertional cp,  orthopnea pnd or leg swelling, presyncope, palpitations, abdominal pain, anorexia, nausea, vomiting, diarrhea  or change in bowel or bladder habits, change in stools or urine, dysuria,hematuria,  rash, arthralgias, visual complaints,  headache, numbness, weakness or ataxia or problems with walking or coordination,  change in mood/affect or memory.                  Objective:   Physical Exam  Moderately hoarse amb wf nad   04/03/2017       100   02/27/2017      96  10/25/16 92 lb 9.6 oz (42 kg)  10/15/16 93 lb 9 oz (42.4 kg)  10/14/16 93 lb (42.2 kg)    Vital signs reviewed  - - Note on arrival 02 sats  95% on RA     HEENT:  Nl  turbinates, and oropharynx. Nl external ear canals without cough reflex - no teeth   NECK :  without JVD/Nodes/TM/ nl carotid upstrokes bilaterally   LUNGS: no acc muscle use, chest slt hyperinflated /hyperresonant to percussion with distant bs bilaterally / no wheeze p saba for pfts  CV:  RRR  no s3 or murmur or increase in P2, no edema   ABD:  soft and nontender with pos late insp hoover's in supine position. No bruits or organomegaly, bowel sounds nl  MS:  Nl gait/ ext warm without deformities, calf tenderness, cyanosis or clubbing No obvious joint restrictions   SKIN: warm and dry without lesions    NEURO:  alert, approp, nl sensorium with  no motor deficits      CXR PA and Lateral:   02/27/2017 :    I personally reviewed images and agree with radiology impression as follows:   Mild hyperexpansion of the lungs is noted consistent with chronic obstructive pulmonary disease. Stable dense nodular density seen in right lung apex most consistent with calcified granuloma. Aortic atherosclerosis     Assessment & Plan:

## 2017-04-03 NOTE — Patient Instructions (Addendum)
Prevnar 13 is the last pneumonia shot you will need   If you are satisfied with your treatment plan,  let your doctor know and he/she can either refill your medications or you can return here when your prescription runs out.     If in any way you are not 100% satisfied,  please tell us.  If 100% better, tell your friends!  Pulmonary follow up is as needed

## 2017-04-07 DIAGNOSIS — Z961 Presence of intraocular lens: Secondary | ICD-10-CM | POA: Diagnosis not present

## 2017-04-07 NOTE — Assessment & Plan Note (Addendum)
10/25/2016  try symbicort 160 2bid instead  of breo > preferred breo - 02/27/2017  After extensive coaching HFA effectiveness =    25% with hfa/ 75% with dpi so try back to BREO - PFT's  04/03/2017  FEV1 0.59 (37 % ) ratio 34  p 12  % improvement from saba p BREO  prior to study with DLCO  35/35 % corrects to 49  % for alv volume     She has severe copd but is well compensated on present rx and no tendency to aecopd and could try anoro instead of breo as she is Pt is Group B in terms of symptom/risk and laba/lama therefore appropriate rx at this point but she likes the [facility] and for now wants to stick with it.   I did recommend the prevnar 13 to complete her pneumococcal prophyliaxis and yearly flu vaccine with f/u here prn worsening symptoms or need for rescue   I had an extended discussion with the patient reviewing all relevant studies completed to date and  lasting 15 to 20 minutes of a 25 minute visit    Each maintenance medication was reviewed in detail including most importantly the difference between maintenance and prns and under what circumstances the prns are to be triggered using an action plan format that is not reflected in the computer generated alphabetically organized AVS.    Please see AVS for specific instructions unique to this visit that I personally wrote and verbalized to the the pt in detail and then reviewed with pt  by my nurse highlighting any  changes in therapy recommended at today's visit to their plan of care.

## 2017-04-07 NOTE — Assessment & Plan Note (Signed)
See cxr 10/25/2016 R apical streaky densities with no comparisons available  - repeat  02/27/17 :  R apical lesion c/w granuloma   She has such severe copd and such benign appearing cxr that the risk/benefit does not favor any intervention here  Discussed in detail all the  indications, usual  risks and alternatives  relative to the benefits with patient who agrees to proceed with conservative f/u as outlined    Pulmonary f/u can be prn

## 2017-04-10 ENCOUNTER — Encounter: Payer: Self-pay | Admitting: Family

## 2017-04-17 ENCOUNTER — Other Ambulatory Visit: Payer: Self-pay

## 2017-04-17 DIAGNOSIS — I70245 Atherosclerosis of native arteries of left leg with ulceration of other part of foot: Secondary | ICD-10-CM

## 2017-04-17 DIAGNOSIS — Z48812 Encounter for surgical aftercare following surgery on the circulatory system: Secondary | ICD-10-CM

## 2017-04-18 ENCOUNTER — Encounter: Payer: Self-pay | Admitting: Family

## 2017-04-18 ENCOUNTER — Ambulatory Visit (HOSPITAL_COMMUNITY)
Admission: RE | Admit: 2017-04-18 | Discharge: 2017-04-18 | Disposition: A | Discharge status: Home / Self Care | Payer: Medicare Other | Source: Ambulatory Visit | Attending: Family | Admitting: Family

## 2017-04-18 ENCOUNTER — Other Ambulatory Visit: Payer: Self-pay

## 2017-04-18 ENCOUNTER — Ambulatory Visit (INDEPENDENT_AMBULATORY_CARE_PROVIDER_SITE_OTHER): Payer: Medicare Other | Admitting: Family

## 2017-04-18 VITALS — BP 141/67 | HR 68 | Temp 97.0°F | Resp 18 | Ht 61.5 in | Wt 102.0 lb

## 2017-04-18 DIAGNOSIS — I70213 Atherosclerosis of native arteries of extremities with intermittent claudication, bilateral legs: Secondary | ICD-10-CM | POA: Diagnosis not present

## 2017-04-18 DIAGNOSIS — Z48812 Encounter for surgical aftercare following surgery on the circulatory system: Secondary | ICD-10-CM | POA: Diagnosis not present

## 2017-04-18 DIAGNOSIS — Z9862 Peripheral vascular angioplasty status: Secondary | ICD-10-CM

## 2017-04-18 DIAGNOSIS — I70245 Atherosclerosis of native arteries of left leg with ulceration of other part of foot: Secondary | ICD-10-CM | POA: Insufficient documentation

## 2017-04-18 DIAGNOSIS — Z87891 Personal history of nicotine dependence: Secondary | ICD-10-CM

## 2017-04-18 LAB — VAS US LOWER EXTREMITY ARTERIAL DUPLEX
LSFDPSV: -93 cm/s
LSFMPSV: -133 cm/s
Left popliteal dist sys PSV: -72 cm/s
RSFPPSV: -151 cm/s

## 2017-04-18 NOTE — Patient Instructions (Signed)

## 2017-04-18 NOTE — Progress Notes (Signed)
[facility]   CC: Follow up peripheral artery occlusive disease  History of Present Illness Carla Dawson is a 83 y.o. female who is s/p angioplasty of left superficial femoral and popliteal artery and left tibioperoneal trunk and posterior tibial artery on 03-04-17 by Dr. Trula Slade. She returns today for one month follow up with ABIs and duplex of the left leg.  Since the procedure on 03-04-17 her left lower leg and foot no longer feel cold, and she can walk several thousand feet before her left calf starts to hurt; before the procedure she could walk about 500 steps before her left calf felt painful.  She has no wounds or ulcers on her feet or legs.   She has a history of right lower extremity rest pain due to sever PAD. She under went revascularization of the right LE femoral to posterior tibial bypass with saphenous vein. She had bilateral femoral pulses and no history of ulcers. She has had multiple procedures on bilateral LE for her symptoms of rest pain. Starting in Sept. 2016: Left Fem-pop atherectomy, left ATA pta, stent, pta. Right Artherectomy tpa femoral stent and then right fem-PT trunk bypass. Both procedures were performed in [place], [place].   She moved from [place] to live with her daughter.  Past medical history: COPD managed with inhalers, Plavix daily s/p stent placement, and HTN managed with Norvasc. She states she has no history of CAD or DM.   She denies any known history of stroke.  Pt Diabetic: No Pt smoker: former smoker, quit in 2007, smoked x 52 years  Pt meds include: Statin : atorvastatin 10 mg Betablocker: No ASA: No Other anticoagulants/antiplatelets: Plavix     Past Medical History:  Diagnosis Date  . Allergy   . Arthritis   . Chicken pox   . Chronic bronchitis ([place])   . Hypertension     Social History Social History  Substance Use Topics  . Smoking status: Former Smoker    Packs/day: 1.50   Years: 52.00    Start date: 12/09/1953    Quit date: 12/09/2005  . Smokeless tobacco: Never Used  . Alcohol use No    Family History Family History  Problem Relation Age of Onset  . Arthritis Mother   . Hypertension Mother   . Arthritis Father   . Hypertension Father   . Cancer Brother   . Hypertension Son     Past Surgical History:  Procedure Laterality Date  . ABDOMINAL AORTOGRAM W/LOWER EXTREMITY N/A 03/04/2017   Procedure: Abdominal Aortogram w/Lower Extremity;  Surgeon: Serafina Mitchell, MD;  Location: [facility];  Service: Cardiovascular;  Laterality: N/A;  . ABDOMINAL HYSTERECTOMY    . PERIPHERAL VASCULAR BALLOON ANGIOPLASTY Left 03/04/2017   Procedure: Peripheral Vascular Balloon Angioplasty;  Surgeon: Serafina Mitchell, MD;  Location: [facility];  Service: Cardiovascular;  Laterality: Left;  SFA and PT  . VEIN BYPASS SURGERY      Allergies  Allergen Reactions  . Lortab [Hydrocodone-Acetaminophen]     Breathing issues; itching; took benedryl     Current Outpatient Prescriptions  Medication Sig Dispense Refill  . amLODipine (NORVASC) 5 MG tablet Take 5 mg by mouth every evening.     Marland Kitchen atorvastatin (LIPITOR) 10 MG tablet Take 1 tablet (10 mg total) by mouth daily. 30 tablet 5  . cetirizine (ZYRTEC) 10 MG tablet Take 10 mg by mouth daily.    . clopidogrel (PLAVIX) 75 MG tablet Take 1  tablet (75 mg total) by mouth daily. (Patient taking differently: Take 75 mg by mouth every evening. ) 90 tablet 1  . dextromethorphan (DELSYM) 30 MG/5ML liquid Take 30 mg by mouth as needed for cough.     . Fiber, Guar Gum, CHEW Chew 1 tablet by mouth daily.     . fluticasone furoate-vilanterol (BREO ELLIPTA) 100-25 MCG/INH AEPB Inhale 1 puff into the lungs daily. 30 each 11  . gabapentin (NEURONTIN) 300 MG capsule Take 1 capsule (300 mg total) by mouth daily. (Patient taking differently: Take 300 mg by mouth 2 (two) times daily as needed (pain). ) 30 capsule 2  . Multiple  Vitamins-Minerals (SENIOR MULTIVITAMIN PLUS) TABS Take 1 tablet by mouth daily.    Marland Kitchen PROAIR HFA 108 (90 Base) MCG/ACT inhaler INHALE 2 PUFFS BY MOUTH EVERY 4 HOURS FOR WHEEZING (Patient taking differently: INHALE 2 PUFFS BY MOUTH EVERY 4 HOURS AS NEEDED FOR WHEEZING) 8.5 g 1   No current facility-administered medications for this visit.     ROS: See HPI for pertinent positives and negatives.   Physical Examination  Vitals:   04/18/17 0930  BP: (!) 141/67  Pulse: 68  Resp: 18  Temp: 97 F (36.1 C)  TempSrc: Oral  SpO2: 97%  Weight: 102 lb (46.3 kg)  Height: 5' 1.5" (1.562 m)   Body mass index is 18.96 kg/m.  General: A&O x 3, WDWN, thin elderly female. Gait: normal Eyes: PERRLA. Pulmonary: Respirations are mildly labored at rest with some use of accessory muscles, occasional cough, limited air movement, no rales, rhonchi, or wheezes.  Cardiac: regular rhythm, no detected murmur.         Carotid Bruits Right Left   Negative Negative  Aorta is not palpable. Radial pulses: 2+ palpable and =                           VASCULAR EXAM: Extremities without ischemic changes, without Gangrene; without open wounds. Feet are cool to touch but pink, all toes are pink with brisk capillary refill.                                                                                                                                                        LE Pulses Right Left       FEMORAL   palpable   palpable        POPLITEAL  not palpable   not palpable       POSTERIOR TIBIAL  not palpable   not palpable        DORSALIS PEDIS      ANTERIOR TIBIAL not palpable  not palpable    Abdomen: soft, NT, no palpable masses. Skin: no rashes, no ulcers noted. Musculoskeletal: Age appropriate muscle wasting and atrophy. Moderate  kyphosis.           Neurologic: A&O X 3; Appropriate Affect, MOTOR FUNCTION:  moving all extremities equally, motor strength 4/5 throughout. Speech is  fluent/normal. CN 2-12 intact.    ASSESSMENT: Carla Dawson is a 83 y.o. female who is s/p angioplasty of left superficial femoral and popliteal artery and left tibioperoneal trunk and posterior tibial artery on 03-04-17.  She is also s/p left Fem-pop atherectomy, left ATA pta, stent, pta. Right Artherectomy tpa femoral stent and then right fem-PT trunk bypass. Both procedures were performed in [place], [place], about 2016 and 2017.    She has significant improvement in her left calf claudication and her left foot and lower leg no longer feel cold to her since the 03-04-17 above procedures.   She has a history of re-occlusion of lower extremity arteries after reperfusion procedures.  Decline in right ABI, improved left ABI.  She does not complain of right leg claudication symptoms.    DATA  Left LE arterial duplex: No significant stenosis in the left SFA stent. TP trunk velocities are 61-90 cm/s. CFA velocity is 308 cm/s  ABI: Right: 0.81 (0.92 on 02-24-17 with tri and biphasic waveforms), waveforms today: monophasic; TBI: 0.61 (was dampened). Left: 0.64 (0.48 on 02-24-17 with monophasic waveforms), waveforms: monophasic; TBI: 0.18 (was dampened). Decline in right ABI from tri and biphasic waveforms to monophasic in 2 months.  Improvement in left ABI since the procedure on 03-04-17.   Serum creatinine on 03-04-17 was 0.6.    PLAN:  Based on the patient's vascular studies and examination, and after discussing with Dr. Bridgett Larsson, pt will be scheduled for arteriogram with bilateral run off, possible intervention right LE, Dr. Trula Slade soonest availability.   I discussed in depth with the patient the nature of atherosclerosis, and emphasized the importance of maximal medical management including strict control of blood pressure, blood glucose, and lipid levels, obtaining regular exercise, and continued cessation of smoking.  The patient is aware that without maximal medical management the  underlying atherosclerotic disease process will progress, limiting the benefit of any interventions.  The patient was given information about PAD including signs, symptoms, treatment, what symptoms should prompt the patient to seek immediate medical care, and risk reduction measures to take.  Clemon Chambers, RN, MSN, FNP-C [facility] Phone: [phone]  Clinic MD: Chen/Cain  04/18/17 9:35 AM

## 2017-04-22 ENCOUNTER — Encounter (HOSPITAL_COMMUNITY)
Admission: RE | Disposition: A | Discharge status: Home / Self Care | Payer: Self-pay | Source: Ambulatory Visit | Attending: Surgery

## 2017-04-22 ENCOUNTER — Ambulatory Visit (HOSPITAL_COMMUNITY)
Admission: RE | Admit: 2017-04-22 | Discharge: 2017-04-22 | Disposition: A | Discharge status: Home / Self Care | Payer: Medicare Other | Source: Ambulatory Visit | Attending: Surgery | Admitting: Surgery

## 2017-04-22 DIAGNOSIS — I7092 Chronic total occlusion of artery of the extremities: Secondary | ICD-10-CM | POA: Diagnosis not present

## 2017-04-22 DIAGNOSIS — I70212 Atherosclerosis of native arteries of extremities with intermittent claudication, left leg: Secondary | ICD-10-CM | POA: Insufficient documentation

## 2017-04-22 DIAGNOSIS — I70221 Atherosclerosis of native arteries of extremities with rest pain, right leg: Secondary | ICD-10-CM | POA: Insufficient documentation

## 2017-04-22 DIAGNOSIS — J449 Chronic obstructive pulmonary disease, unspecified: Secondary | ICD-10-CM | POA: Diagnosis not present

## 2017-04-22 DIAGNOSIS — M1990 Unspecified osteoarthritis, unspecified site: Secondary | ICD-10-CM | POA: Diagnosis not present

## 2017-04-22 DIAGNOSIS — Z7902 Long term (current) use of antithrombotics/antiplatelets: Secondary | ICD-10-CM | POA: Diagnosis not present

## 2017-04-22 DIAGNOSIS — Z87891 Personal history of nicotine dependence: Secondary | ICD-10-CM | POA: Diagnosis not present

## 2017-04-22 DIAGNOSIS — I10 Essential (primary) hypertension: Secondary | ICD-10-CM | POA: Diagnosis not present

## 2017-04-22 DIAGNOSIS — I70213 Atherosclerosis of native arteries of extremities with intermittent claudication, bilateral legs: Secondary | ICD-10-CM | POA: Diagnosis not present

## 2017-04-22 DIAGNOSIS — I739 Peripheral vascular disease, unspecified: Secondary | ICD-10-CM | POA: Diagnosis present

## 2017-04-22 HISTORY — PX: ABDOMINAL AORTOGRAM: CATH118222

## 2017-04-22 HISTORY — PX: LOWER EXTREMITY ANGIOGRAPHY: CATH118251

## 2017-04-22 LAB — POCT I-STAT, CHEM 8
BUN: 16 mg/dL (ref 6–20)
CALCIUM ION: 1.18 mmol/L (ref 1.15–1.40)
Chloride: 103 mmol/L (ref 101–111)
Creatinine, Ser: 0.7 mg/dL (ref 0.44–1.00)
Glucose, Bld: 88 mg/dL (ref 65–99)
HEMATOCRIT: 37 % (ref 36.0–46.0)
Hemoglobin: 12.6 g/dL (ref 12.0–15.0)
Potassium: 4.2 mmol/L (ref 3.5–5.1)
Sodium: 141 mmol/L (ref 135–145)
TCO2: 26 mmol/L (ref 0–100)

## 2017-04-22 SURGERY — ABDOMINAL AORTOGRAM
Anesthesia: LOCAL

## 2017-04-22 MED ORDER — SODIUM CHLORIDE 0.9 % IV SOLN
INTRAVENOUS | Status: DC
  Administered 2017-04-22: 12:00:00 via INTRAVENOUS

## 2017-04-22 MED ORDER — LABETALOL HCL 5 MG/ML IV SOLN: 10.0000 mg | INTRAVENOUS | Status: DC | PRN

## 2017-04-22 MED ORDER — HYDRALAZINE HCL 20 MG/ML IJ SOLN: 5.0000 mg | INTRAMUSCULAR | Status: DC | PRN

## 2017-04-22 MED ORDER — SODIUM CHLORIDE 0.9 % IV SOLN
INTRAVENOUS | Status: DC
  Administered 2017-04-22: 15:00:00 via INTRAVENOUS

## 2017-04-22 MED ORDER — DOCUSATE SODIUM 100 MG PO CAPS: 100.0000 mg | ORAL_CAPSULE | Freq: Every day | ORAL | Status: DC

## 2017-04-22 MED ORDER — HEPARIN (PORCINE) IN NACL 2-0.9 UNIT/ML-% IJ SOLN
INTRAMUSCULAR | Status: AC | PRN
  Administered 2017-04-22: 1000 mL

## 2017-04-22 MED ORDER — METOPROLOL TARTRATE 5 MG/5ML IV SOLN: 2.0000 mg | INTRAVENOUS | Status: DC | PRN

## 2017-04-22 MED ORDER — LIDOCAINE HCL 1 % IJ SOLN
INTRAMUSCULAR | Status: AC
  Filled 2017-04-22: qty 20

## 2017-04-22 MED ORDER — ALUM & MAG HYDROXIDE-SIMETH 200-200-20 MG/5ML PO SUSP: 15.0000 mL | ORAL | Status: DC | PRN

## 2017-04-22 MED ORDER — PHENOL 1.4 % MT LIQD: 1.0000 | OROMUCOSAL | Status: DC | PRN

## 2017-04-22 MED ORDER — MIDAZOLAM HCL 2 MG/2ML IJ SOLN
INTRAMUSCULAR | Status: AC
  Filled 2017-04-22: qty 2

## 2017-04-22 MED ORDER — IODIXANOL 320 MG/ML IV SOLN
INTRAVENOUS | Status: DC | PRN
  Administered 2017-04-22: 90 mL via INTRAVENOUS

## 2017-04-22 MED ORDER — ONDANSETRON HCL 4 MG/2ML IJ SOLN: 4.0000 mg | Freq: Four times a day (QID) | INTRAMUSCULAR | Status: DC | PRN

## 2017-04-22 MED ORDER — GUAIFENESIN-DM 100-10 MG/5ML PO SYRP: 15.0000 mL | ORAL_SOLUTION | ORAL | Status: DC | PRN

## 2017-04-22 MED ORDER — FENTANYL CITRATE (PF) 100 MCG/2ML IJ SOLN
INTRAMUSCULAR | Status: AC
  Filled 2017-04-22: qty 2

## 2017-04-22 MED ORDER — SODIUM CHLORIDE 0.9 % IV SOLN: 500.0000 mL | Freq: Once | INTRAVENOUS | Status: DC | PRN

## 2017-04-22 MED ORDER — MIDAZOLAM HCL 2 MG/2ML IJ SOLN
INTRAMUSCULAR | Status: DC | PRN
  Administered 2017-04-22: 1 mg via INTRAVENOUS

## 2017-04-22 MED ORDER — LIDOCAINE HCL (PF) 1 % IJ SOLN
INTRAMUSCULAR | Status: DC | PRN
  Administered 2017-04-22: 10 mL

## 2017-04-22 MED ORDER — FENTANYL CITRATE (PF) 100 MCG/2ML IJ SOLN
INTRAMUSCULAR | Status: DC | PRN
  Administered 2017-04-22: 25 ug via INTRAVENOUS

## 2017-04-22 SURGICAL SUPPLY — 9 items
CATH OMNI FLUSH 5F 65CM (CATHETERS) ×3 IMPLANT
COVER DOME SNAP 22 D (MISCELLANEOUS) ×6 IMPLANT
KIT MICROINTRODUCER STIFF 5F (SHEATH) ×3 IMPLANT
KIT PV (KITS) ×3 IMPLANT
SHEATH PINNACLE 5F 10CM (SHEATH) ×3 IMPLANT
SYR MEDRAD MARK V 150ML (SYRINGE) ×3 IMPLANT
TRANSDUCER W/STOPCOCK (MISCELLANEOUS) ×3 IMPLANT
TRAY PV CATH (CUSTOM PROCEDURE TRAY) ×3 IMPLANT
WIRE BENTSON .035X145CM (WIRE) ×3 IMPLANT

## 2017-04-22 NOTE — Interval H&P Note (Signed)
History and Physical Interval Note:  04/22/2017 1:07 PM  Carla Dawson  has presented today for surgery, with the diagnosis of pvd with decline in ABI  The various methods of treatment have been discussed with the patient and family. After consideration of risks, benefits and other options for treatment, the patient has consented to  Procedure(s): Abdominal Aortogram w/Lower Extremity (N/A) as a surgical intervention .  The patient's history has been reviewed, patient examined, no change in status, stable for surgery.  I have reviewed the patient's chart and labs.  Questions were answered to the patient's satisfaction.     [name]

## 2017-04-22 NOTE — H&P (View-Only) (Signed)
[facility] [place]   CC: Follow up peripheral artery occlusive disease  History of Present Illness Carla Dawson is a 83 y.o. female who is s/p angioplasty of left superficial femoral and popliteal artery and left tibioperoneal trunk and posterior tibial artery on 03-04-17 by Dr. Trula Slade. She returns today for one month follow up with ABIs and duplex of the left leg.  Since the procedure on 03-04-17 her left lower leg and foot no longer feel cold, and she can walk several thousand feet before her left calf starts to hurt; before the procedure she could walk about 500 steps before her left calf felt painful.  She has no wounds or ulcers on her feet or legs.   She has a history of right lower extremity rest pain due to sever PAD. She under went revascularization of the right LE femoral to posterior tibial bypass with saphenous vein. She had bilateral femoral pulses and no history of ulcers. She has had multiple procedures on bilateral LE for her symptoms of rest pain. Starting in Sept. 2016: Left Fem-pop atherectomy, left ATA pta, stent, pta. Right Artherectomy tpa femoral stent and then right fem-PT trunk bypass. Both procedures were performed in [place], [place].   She moved from [place] to live with her daughter.  Past medical history: COPD managed with inhalers, Plavix daily s/p stent placement, and HTN managed with Norvasc. She states she has no history of CAD or DM.   She denies any known history of stroke.  Pt Diabetic: No Pt smoker: former smoker, quit in 2007, smoked x 52 years  Pt meds include: Statin : atorvastatin 10 mg Betablocker: No ASA: No Other anticoagulants/antiplatelets: Plavix     Past Medical History:  Diagnosis Date  . Allergy   . Arthritis   . Chicken pox   . Chronic bronchitis ([place])   . Hypertension     Social History Social History  Substance Use Topics  . Smoking status: Former Smoker    Packs/day: 1.50   Years: 52.00    Start date: 12/09/1953    Quit date: 12/09/2005  . Smokeless tobacco: Never Used  . Alcohol use No    Family History Family History  Problem Relation Age of Onset  . Arthritis Mother   . Hypertension Mother   . Arthritis Father   . Hypertension Father   . Cancer Brother   . Hypertension Son     Past Surgical History:  Procedure Laterality Date  . ABDOMINAL AORTOGRAM W/LOWER EXTREMITY N/A 03/04/2017   Procedure: Abdominal Aortogram w/Lower Extremity;  Surgeon: Serafina Mitchell, MD;  Location: [facility];  Service: Cardiovascular;  Laterality: N/A;  . ABDOMINAL HYSTERECTOMY    . PERIPHERAL VASCULAR BALLOON ANGIOPLASTY Left 03/04/2017   Procedure: Peripheral Vascular Balloon Angioplasty;  Surgeon: Serafina Mitchell, MD;  Location: [facility];  Service: Cardiovascular;  Laterality: Left;  SFA and PT  . VEIN BYPASS SURGERY      Allergies  Allergen Reactions  . Lortab [Hydrocodone-Acetaminophen]     Breathing issues; itching; took benedryl     Current Outpatient Prescriptions  Medication Sig Dispense Refill  . amLODipine (NORVASC) 5 MG tablet Take 5 mg by mouth every evening.     Marland Kitchen atorvastatin (LIPITOR) 10 MG tablet Take 1 tablet (10 mg total) by mouth daily. 30 tablet 5  . cetirizine (ZYRTEC) 10 MG tablet Take 10 mg by mouth daily.    . clopidogrel (PLAVIX) 75 MG tablet Take 1  tablet (75 mg total) by mouth daily. (Patient taking differently: Take 75 mg by mouth every evening. ) 90 tablet 1  . dextromethorphan (DELSYM) 30 MG/5ML liquid Take 30 mg by mouth as needed for cough.     . Fiber, Guar Gum, CHEW Chew 1 tablet by mouth daily.     . fluticasone furoate-vilanterol (BREO ELLIPTA) 100-25 MCG/INH AEPB Inhale 1 puff into the lungs daily. 30 each 11  . gabapentin (NEURONTIN) 300 MG capsule Take 1 capsule (300 mg total) by mouth daily. (Patient taking differently: Take 300 mg by mouth 2 (two) times daily as needed (pain). ) 30 capsule 2  . Multiple  Vitamins-Minerals (SENIOR MULTIVITAMIN PLUS) TABS Take 1 tablet by mouth daily.    Marland Kitchen PROAIR HFA 108 (90 Base) MCG/ACT inhaler INHALE 2 PUFFS BY MOUTH EVERY 4 HOURS FOR WHEEZING (Patient taking differently: INHALE 2 PUFFS BY MOUTH EVERY 4 HOURS AS NEEDED FOR WHEEZING) 8.5 g 1   No current facility-administered medications for this visit.     ROS: See HPI for pertinent positives and negatives.   Physical Examination  Vitals:   04/18/17 0930  BP: (!) 141/67  Pulse: 68  Resp: 18  Temp: 97 F (36.1 C)  TempSrc: Oral  SpO2: 97%  Weight: 102 lb (46.3 kg)  Height: 5' 1.5" (1.562 m)   Body mass index is 18.96 kg/m.  General: A&O x 3, WDWN, thin elderly female. Gait: normal Eyes: PERRLA. Pulmonary: Respirations are mildly labored at rest with some use of accessory muscles, occasional cough, limited air movement, no rales, rhonchi, or wheezes.  Cardiac: regular rhythm, no detected murmur.         Carotid Bruits Right Left   Negative Negative  Aorta is not palpable. Radial pulses: 2+ palpable and =                           VASCULAR EXAM: Extremities without ischemic changes, without Gangrene; without open wounds. Feet are cool to touch but pink, all toes are pink with brisk capillary refill.                                                                                                                                                        LE Pulses Right Left       FEMORAL   palpable   palpable        POPLITEAL  not palpable   not palpable       POSTERIOR TIBIAL  not palpable   not palpable        DORSALIS PEDIS      ANTERIOR TIBIAL not palpable  not palpable    Abdomen: soft, NT, no palpable masses. Skin: no rashes, no ulcers noted. Musculoskeletal: Age appropriate muscle wasting and atrophy. Moderate  kyphosis.           Neurologic: A&O X 3; Appropriate Affect, MOTOR FUNCTION:  moving all extremities equally, motor strength 4/5 throughout. Speech is  fluent/normal. CN 2-12 intact.    ASSESSMENT: Carla Dawson is a 83 y.o. female who is s/p angioplasty of left superficial femoral and popliteal artery and left tibioperoneal trunk and posterior tibial artery on 03-04-17.  She is also s/p left Fem-pop atherectomy, left ATA pta, stent, pta. Right Artherectomy tpa femoral stent and then right fem-PT trunk bypass. Both procedures were performed in [place], [place], about 2016 and 2017.    She has significant improvement in her left calf claudication and her left foot and lower leg no longer feel cold to her since the 03-04-17 above procedures.   She has a history of re-occlusion of lower extremity arteries after reperfusion procedures.  Decline in right ABI, improved left ABI.  She does not complain of right leg claudication symptoms.    DATA  Left LE arterial duplex: No significant stenosis in the left SFA stent. TP trunk velocities are 61-90 cm/s. CFA velocity is 308 cm/s  ABI: Right: 0.81 (0.92 on 02-24-17 with tri and biphasic waveforms), waveforms today: monophasic; TBI: 0.61 (was dampened). Left: 0.64 (0.48 on 02-24-17 with monophasic waveforms), waveforms: monophasic; TBI: 0.18 (was dampened). Decline in right ABI from tri and biphasic waveforms to monophasic in 2 months.  Improvement in left ABI since the procedure on 03-04-17.   Serum creatinine on 03-04-17 was 0.6.    PLAN:  Based on the patient's vascular studies and examination, and after discussing with Dr. Bridgett Larsson, pt will be scheduled for arteriogram with bilateral run off, possible intervention right LE, Dr. Trula Slade soonest availability.   I discussed in depth with the patient the nature of atherosclerosis, and emphasized the importance of maximal medical management including strict control of blood pressure, blood glucose, and lipid levels, obtaining regular exercise, and continued cessation of smoking.  The patient is aware that without maximal medical management the  underlying atherosclerotic disease process will progress, limiting the benefit of any interventions.  The patient was given information about PAD including signs, symptoms, treatment, what symptoms should prompt the patient to seek immediate medical care, and risk reduction measures to take.  Clemon Chambers, RN, MSN, FNP-C [facility] Phone: [phone]  Clinic MD: Chen/Cain  04/18/17 9:35 AM

## 2017-04-22 NOTE — Interval H&P Note (Signed)
History and Physical Interval Note:  04/22/2017 12:16 PM  Carla Dawson  has presented today for surgery, with the diagnosis of pvd with decline in ABI  The various methods of treatment have been discussed with the patient and family. After consideration of risks, benefits and other options for treatment, the patient has consented to  Procedure(s): Abdominal Aortogram w/Lower Extremity (N/A) as a surgical intervention .  The patient's history has been reviewed, patient examined, no change in status, stable for surgery.  I have reviewed the patient's chart and labs.  Questions were answered to the patient's satisfaction.     [name]

## 2017-04-22 NOTE — Discharge Instructions (Signed)

## 2017-04-22 NOTE — Op Note (Signed)
    Patient name: Carla Dawson MRN: [id number] DOB: 06/06/1931 Sex: female  04/22/2017 Pre-operative Diagnosis: Decrease in ABIs Post-operative diagnosis:  Same Surgeon:  [name] Procedure Performed:  1.  Ultrasound-guided access, left femoral artery  2.  Abdominal aortogram  3.  Bilateral lower extremity runoff  4.  Second order catheterization  5.  Conscious sedation ( 35minutes)     Indications:  The patient is undergone multiple revascularization procedures and [place].  Over the past 3 months, she has had ultrasound studies on the right leg which show a significant change in the waveform.  Therefore she comes in today for further evaluation  Procedure:  The patient was identified in the holding area and taken to room 8.  The patient was then placed supine on the table and prepped and draped in the usual sterile fashion.  A time out was called.  Conscious sedation was performed with the use of IV fentanyl and Versed in a continuous physician and nurse monitoring.  Heart rate, blood pressure, oxygen saturations were continuously monitored.  Ultrasound was used to evaluate the left common femoral artery.  It was patent .  A digital ultrasound image was acquired.  A micropuncture needle was used to access the left common femoral artery under ultrasound guidance.  An 018 wire was advanced without resistance and a micropuncture sheath was placed.  The 018 wire was removed and a benson wire was placed.  The micropuncture sheath was exchanged for a 5 french sheath.  An omniflush catheter was advanced over the wire to the level of L-1.  An abdominal angiogram was obtained.  Next, using the omniflush catheter and a benson wire, the aortic bifurcation was crossed and the catheter was placed into theright external iliac artery and right runoff was obtained.  left runoff was performed via retrograde sheath injections.  Findings:   Aortogram:  No significant renal artery stenosis is  identified.  Her right kidney sits just above the pelvis.  The infrarenal abdominal aorta is small in caliber but patent without stenosis.  There is luminal irregularity on the left lateral side above the bifurcation.  No significant common or external iliac artery stenosis bilaterally.  Right Lower Extremity:  The right common femoral and profunda femoral artery are patent.  There is luminal irregularity within the common femoral artery which may be the source of the decrease and waveforms.   Bypass graft is visualized the common femoral artery down to the tibioperoneal trunk.  There is single vessel runoff via the peroneal artery.  The bypass graft is widely patent.  The superficial femoral artery and associated stents are occluded.  Left Lower Extremity:  The left common femoral and profunda femoral artery are patent.  The stent within the superficial femoral artery remained patent.  There is three-vessel runoff with the dominant vessel being the posterior tibial artery  Intervention:  None  Impression:  #1  slight progression of disease within the right common femoral artery which is likely the etiology of the change in waveforms.  #2  patent right leg bypass  #3  patent stents within the left lower extremity    V. [name], M.D. [facility] Office: [phone] Pager:  [phone]

## 2017-04-23 ENCOUNTER — Encounter (HOSPITAL_COMMUNITY): Payer: Self-pay | Admitting: Surgery

## 2017-05-12 ENCOUNTER — Other Ambulatory Visit: Payer: Self-pay | Admitting: Family Medicine

## 2017-05-18 IMAGING — DX DG CHEST 2V
2 series · 2 of 2 positions shown · non-contrast
Comparison: None in PACs

CLINICAL DATA: History of COPD, former smoker, peripheral vascular
disease, hypertension.

EXAM:
CHEST  2 VIEW

[chest pa]
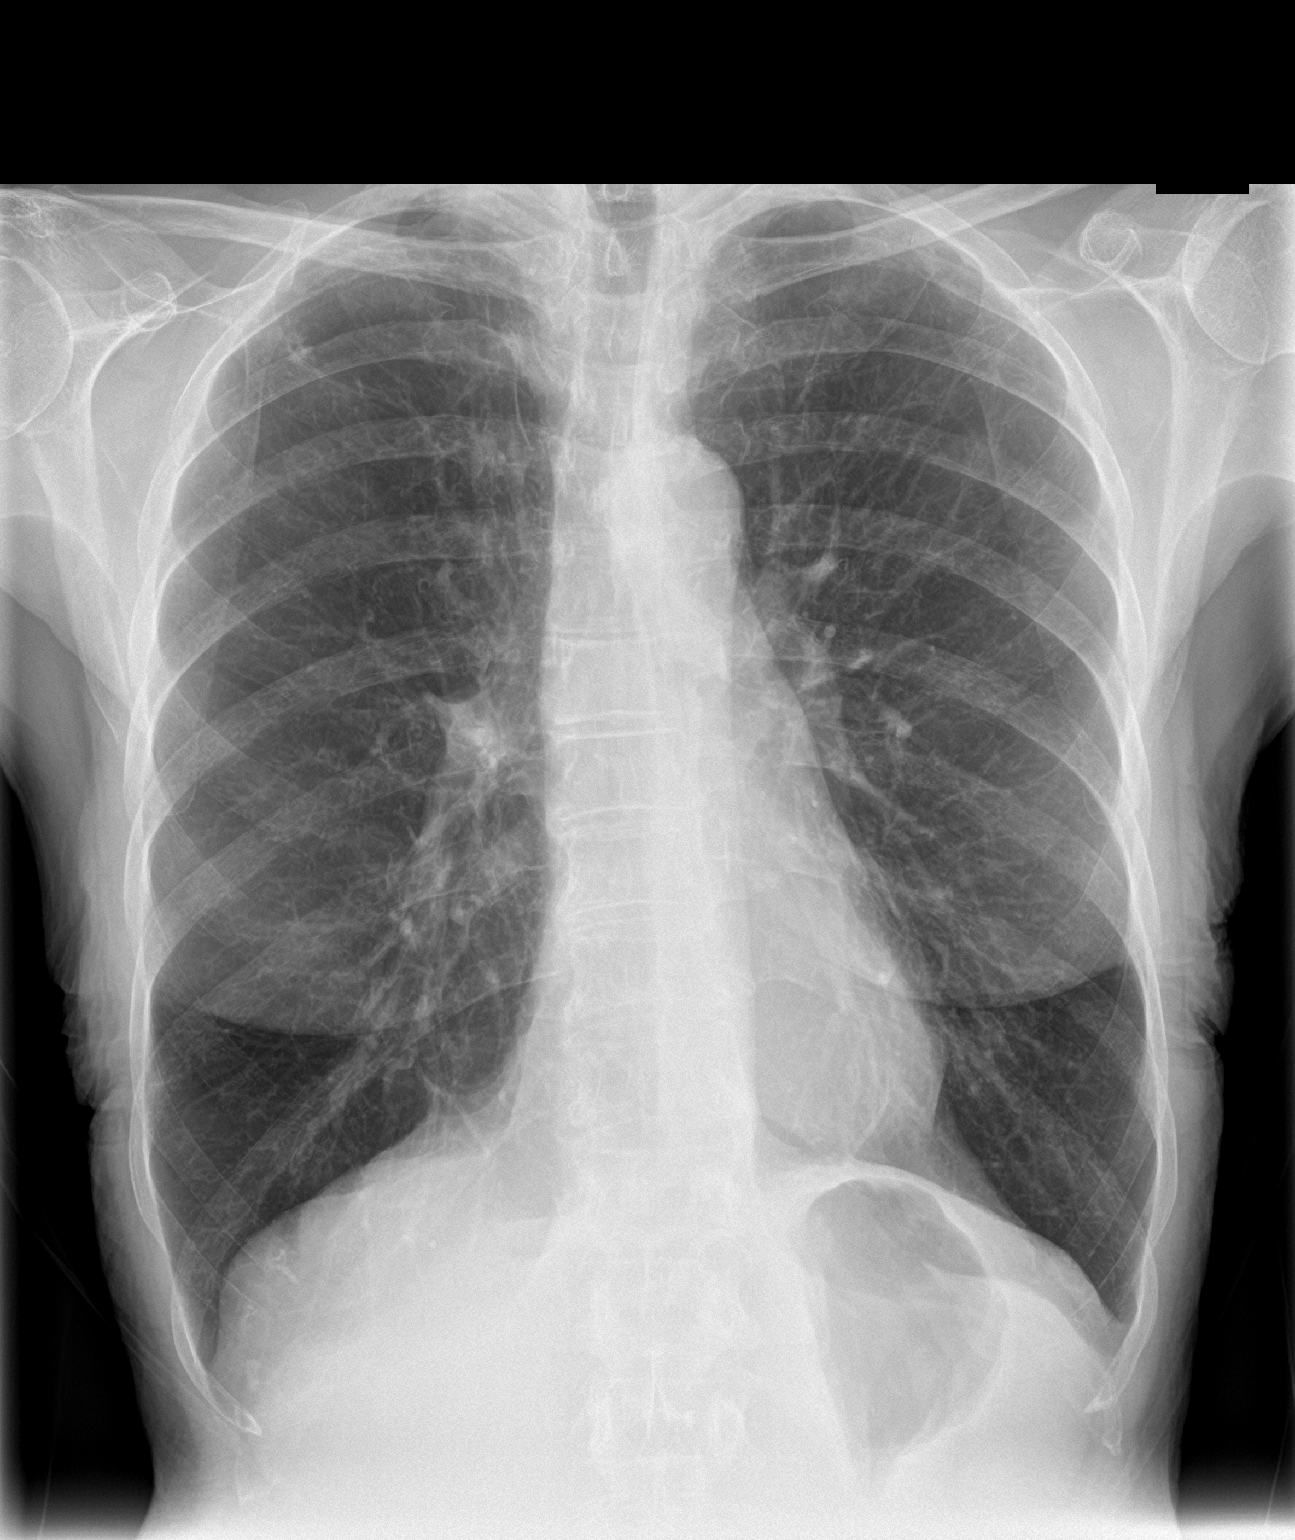

[chest lat]
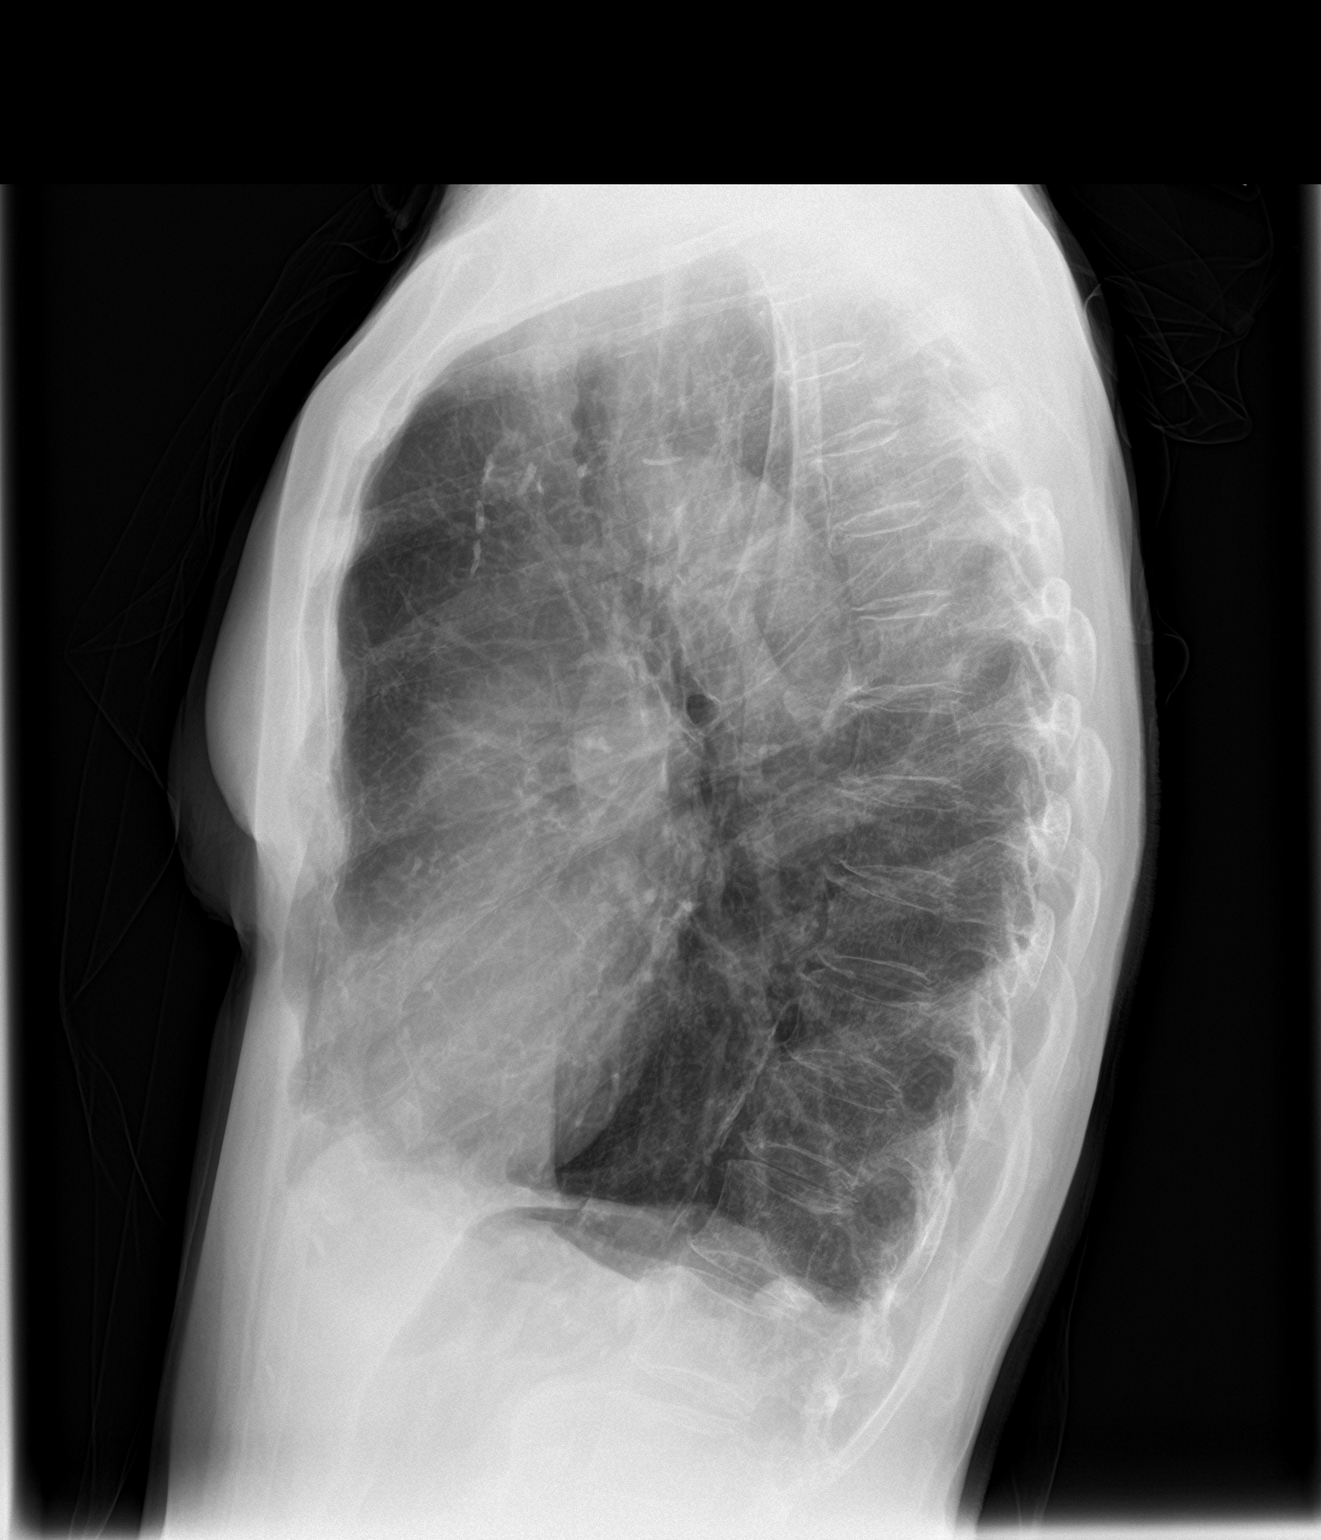

[2 of 2 positions shown; findings below may reference images not displayed]

FINDINGS: The lungs are hyperinflated with hemidiaphragm flattening. There is
no focal infiltrate. There is subtle nodular density in the right
upper lobe posteriorly measuring approximately 6 x 10 mm. It is not
clearly solid. The heart and pulmonary vascularity are normal. There
is calcification in the wall of the aortic arch. The bony thorax is
unremarkable.
IMPRESSION: COPD. Subtle right upper lobe nodularity. Given the patient's
smoking history. Chest CT scanning is recommended to exclude
abnormal nodules.

Thoracic aortic atherosclerosis.

## 2017-06-04 ENCOUNTER — Telehealth: Payer: Self-pay | Admitting: Surgery

## 2017-06-04 ENCOUNTER — Telehealth: Payer: Self-pay | Admitting: Internal Medicine

## 2017-06-04 DIAGNOSIS — J449 Chronic obstructive pulmonary disease, unspecified: Secondary | ICD-10-CM

## 2017-06-04 MED ORDER — ALBUTEROL SULFATE (2.5 MG/3ML) 0.083% IN NEBU: 2.5000 mg | INHALATION_SOLUTION | RESPIRATORY_TRACT | 12 refills | Status: DC | PRN

## 2017-06-04 NOTE — Telephone Encounter (Signed)
Spoke with pt's daughter [name] (dpr on file) states that pt's nebulizer is very old, over 81 years old, and is requesting a rx for a new nebulizer as well as a refill on neb meds.  I advised that per pt's med list she is not on any nebulized meds.  Pt's daughter states that she does not currently have neb meds, but with the hot/humid temps making pt sob, she would like to have this in home "just in case".  MW please advise if ok to order new neb and meds, and if so what meds you'd like to order.    Pt uses [facility] in [place], is not established with a DME.  04/03/17 phone note:  Patient Instructions   Prevnar 13 is the last pneumonia shot you will need    If you are satisfied with your treatment plan,  let your doctor know and he/she can either refill your medications or you can return here when your prescription runs out.      If in any way you are not 100% satisfied,  please tell us.  If 100% better, tell your friends!   Pulmonary follow up is as needed

## 2017-06-04 NOTE — Telephone Encounter (Signed)
Sched appt 09/08/17; labs at 2:00 and NP at 3:15. Mailed pt appt letter.

## 2017-06-04 NOTE — Telephone Encounter (Signed)
Spoke with [name], aware of rec's per MW -- neb machine order placed. Albuterol neb meds sent to [facility] per [name]'s request. Nothing further needed.

## 2017-06-04 NOTE — Telephone Encounter (Signed)
-----   Message from Mena Goes, RN sent at 06/04/2017 12:24 PM EDT ----- Regarding: can someone check on this appt, I don't see it in EPIC yet, sent it on 04-22-17   ----- Message ----- From: Serafina Mitchell, MD Sent: 04/22/2017   1:52 PM To: [facility] Charge Pool  04/22/2017:  Surgeon:  [name] Procedure Performed:  1.  Ultrasound-guided access, left femoral artery  2.  Abdominal aortogram  3.  Bilateral lower extremity runoff  4.  Second order catheterization  5.  Conscious sedation ( 68minutes)  Follow-up 3 months with a duplex of bilateral lower extremity and ABIs to see Vinnie Level

## 2017-06-04 NOTE — Telephone Encounter (Signed)
Ok to get neb and use albuterol 2.5 mg q 4 h prn as last resort (p try the proair 0 but other meds stay the same

## 2017-06-06 ENCOUNTER — Telehealth: Payer: Self-pay | Admitting: *Deleted

## 2017-06-06 NOTE — Telephone Encounter (Signed)
Carla Dawson has had increased myalgias in both legs since starting on the Lipitor 10mg  on 03-04-17 per her daughter, Carla Dawson. She has had no falls or injury and has had no non-healing ulcers since last seen by Dr. Trula Slade on 04-22-17 for her outpatient aortogram with bilateral runoff. Carla Dawson states that her mother has had no changes in color or temperature in her legs. They can feel pulses in both feet. Carla Dawson was inquiring about the statin causing this muscle pain, so I have instructed her to stop the Lipitor 10 mg for 2 weeks and then call be back with a status update. She will continue taking her Plavix as directed.  I also told Carla Dawson to call me anytime if her mom has any other problems.  She voiced understanding and agreement with this plan.

## 2017-06-19 NOTE — Progress Notes (Addendum)
[name] is a 83 y.o.female, who is here today with her daughter to follow on HTN. She has some hearing loss, daughter helps me with interrogation and provides most of information.  Currently she is on Amlodipine 5 mg daily. She is taking medications as instructed, tolerating well.  She denies headache, visual changes, exertional chest pain, worsening dyspnea, or focal weakness.    Lab Results  Component Value Date   CREATININE 0.70 04/22/2017   BUN 16 04/22/2017   NA 141 04/22/2017   K 4.2 04/22/2017   CL 103 04/22/2017    PAD following with Dr Trula Slade, COPD following with Dr Melvyn Novas. Exertional dyspnea stable.  LE neuropathic pain, she is taking Gabapentin as needed.  Concerns today:  Daugther is requesting "electrolytes" check. She has had calves cramps for the past 4-6 weeks, initially associated with walking. She counts her steps and usually she starts with severe cramping L>R when she walks 240 steps,she is used to walk 600+ One time this week she had cramps while she was watching TV.She feels muscle "tighing up", and has no cramps while she is in bed.   She has not noted erythema. Edema is stable.  Usually alleviated by rest and takes about 5-10 min to resolve.   She started Lipitor in 03/2017, because cramps she discontinued it 2 weeks ago. She has not noted major improvement in symptoms.   Lab Results  Component Value Date   TSH 4.63 04/23/2013   Lab Results  Component Value Date   WBC 5.4 04/23/2013   HGB 12.6 04/22/2017   HCT 37.0 04/22/2017   PLT 254 04/23/2013   According to pt and daughter, she just followed with Dr Trula Slade recently and she was told cramps were not related to PAD.    Review of Systems  Constitutional: Negative for activity change, appetite change, fatigue, fever and unexpected weight change.  HENT: Negative for mouth sores, nosebleeds and trouble swallowing.   Eyes: Negative for redness and visual disturbance.    Respiratory: Positive for shortness of breath. Negative for wheezing.   Cardiovascular: Positive for leg swelling. Negative for chest pain and palpitations.  Gastrointestinal: Negative for abdominal pain, nausea and vomiting.       Negative for changes in bowel habits.  Endocrine: Negative for cold intolerance and heat intolerance.  Genitourinary: Negative for decreased urine volume and hematuria.  Musculoskeletal: Positive for gait problem and myalgias.  Skin: Negative for rash and wound.  Neurological: Negative for syncope, weakness and headaches.  Psychiatric/Behavioral: Negative for confusion and sleep disturbance. The patient is not nervous/anxious.      Current Outpatient Prescriptions on File Prior to Visit  Medication Sig Dispense Refill  . albuterol (PROVENTIL) (2.5 MG/3ML) 0.083% nebulizer solution Take 3 mLs (2.5 mg total) by nebulization every 4 (four) hours as needed for wheezing or shortness of breath. 75 mL 12  . cetirizine (ZYRTEC) 10 MG tablet Take 10 mg by mouth every evening.     . Fiber, Guar Gum, CHEW Chew 1 tablet by mouth daily.     . fluticasone furoate-vilanterol (BREO ELLIPTA) 100-25 MCG/INH AEPB Inhale 1 puff into the lungs daily. 30 each 11  . Multiple Vitamin (MULTIVITAMIN WITH MINERALS) TABS tablet Take 1 tablet by mouth every evening.     Marland Kitchen PROAIR HFA 108 (90 Base) MCG/ACT inhaler INHALE 2 PUFFS BY MOUTH EVERY 4 HOURS FOR WHEEZING (Patient taking differently: INHALE 2 PUFFS BY MOUTH EVERY 4 HOURS AS NEEDED FOR WHEEZING)  8.5 g 1   No current facility-administered medications on file prior to visit.      Past Medical History:  Diagnosis Date  . Allergy   . Arthritis   . Chicken pox   . Chronic bronchitis ([place])   . Hypertension     Allergies  Allergen Reactions  . Lortab [Hydrocodone-Acetaminophen] Shortness Of Breath and Itching    turns real red, took benadryl     Social History   Social History  . Marital status: Divorced    Spouse name:  N/A  . Number of children: N/A  . Years of education: N/A   Social History Main Topics  . Smoking status: Former Smoker    Packs/day: 1.50    Years: 52.00    Start date: 12/09/1953    Quit date: 12/09/2005  . Smokeless tobacco: Never Used  . Alcohol use No  . Drug use: No  . Sexual activity: Not Currently   Other Topics Concern  . None   Social History Narrative  . None    Vitals:   06/20/17 1439  BP: 138/70  Pulse: 71  Resp: 12  O2 sat at RA 92% Body mass index is 19.15 kg/m.  Physical Exam  Nursing note and vitals reviewed. Constitutional: She is oriented to person, place, and time. She appears well-developed and well-nourished. No distress.  HENT:  Head: Atraumatic.  Mouth/Throat: Oropharynx is clear and moist and mucous membranes are normal. She has dentures.  Eyes: Pupils are equal, round, and reactive to light. Conjunctivae and EOM are normal.  Neck: No tracheal deviation present. No thyroid mass and no thyromegaly present.  Cardiovascular: Normal rate and regular rhythm.   No murmur heard. DP pulses present bilateral. Good capillary refill bilateral. Holman's sign negative bilateral.   Respiratory: Effort normal. No respiratory distress. She has decreased breath sounds (stable). She has no wheezes. She has no rhonchi. She has no rales.  GI: Soft. She exhibits no mass. There is no hepatomegaly. There is no tenderness.  Musculoskeletal: She exhibits edema (Trace pitting edema,LE bilateral.) and tenderness (Upon palpation of calves, reported as baseline).  Right ankle mild decreased of dorsal flexion, no pain elicited bilateral with ROM.   Lymphadenopathy:    She has no cervical adenopathy.  Neurological: She is alert and oriented to person, place, and time. She has normal strength. Coordination normal.  Skin: Skin is warm. No abrasion and no rash noted. No erythema.  Psychiatric: She has a normal mood and affect.  Well groomed, good eye contact.    ASSESSMENT  AND PLAN:   [name] was seen today for follow-up.  Diagnoses and all orders for this visit:  Bilateral leg cramps  We discussed possible etiologies: electrolyte abnormalities,medications, claudication,or idiopathic among some. She can try taking Gabapentin daily at bedtime if she is still having cramps in a week (3 weeks after discontinuing Lipitor), some side effects discussed, fall prevention. Adequate hydration.  -     gabapentin (NEURONTIN) 300 MG capsule; Take 1 capsule (300 mg total) by mouth at bedtime. -     TSH -     CK -     Magnesium  Essential hypertension, benign  Adequately controlled. No changes in current management. DASH-low salt diet to continue. Eye exam 09/2016. F/U in 6 months, before if needed.  -     amLODipine (NORVASC) 5 MG tablet; Take 1 tablet (5 mg total) by mouth daily. -     Basic metabolic panel -  TSH  PAD (peripheral artery disease) (HCC)  Skin care and injury prevention discussed. Cramps she is reporting could be related to this problem. Continue Plavix. Former smoker.  Instructed about warning signs. Continue following with Dr Russella Dar   -     clopidogrel (PLAVIX) 75 MG tablet; TAKE 1 TABLET(75 MG) BY MOUTH DAILY -     TSH      -[name] was advised to return sooner than planned today if new concerns arise.     Carla G. Martinique, MD  [facility]. [place] office.

## 2017-06-20 ENCOUNTER — Encounter: Payer: Self-pay | Admitting: Family Medicine

## 2017-06-20 ENCOUNTER — Ambulatory Visit (INDEPENDENT_AMBULATORY_CARE_PROVIDER_SITE_OTHER): Payer: Medicare Other | Admitting: Family Medicine

## 2017-06-20 VITALS — BP 138/70 | HR 71 | Resp 12 | Ht 61.0 in | Wt 101.4 lb

## 2017-06-20 DIAGNOSIS — R252 Cramp and spasm: Secondary | ICD-10-CM

## 2017-06-20 DIAGNOSIS — I739 Peripheral vascular disease, unspecified: Secondary | ICD-10-CM

## 2017-06-20 DIAGNOSIS — I70213 Atherosclerosis of native arteries of extremities with intermittent claudication, bilateral legs: Secondary | ICD-10-CM

## 2017-06-20 DIAGNOSIS — I10 Essential (primary) hypertension: Secondary | ICD-10-CM | POA: Diagnosis not present

## 2017-06-20 LAB — TSH: TSH: 1.65 m[IU]/L

## 2017-06-20 MED ORDER — GABAPENTIN 300 MG PO CAPS: 300.0000 mg | ORAL_CAPSULE | Freq: Every day | ORAL | 2 refills | Status: DC

## 2017-06-20 MED ORDER — AMLODIPINE BESYLATE 5 MG PO TABS: 5.0000 mg | ORAL_TABLET | Freq: Every day | ORAL | 2 refills | Status: DC

## 2017-06-20 MED ORDER — CLOPIDOGREL BISULFATE 75 MG PO TABS: ORAL_TABLET | ORAL | 3 refills | Status: DC

## 2017-06-20 NOTE — Patient Instructions (Addendum)
A few things to remember from today's visit:   Essential hypertension, benign - Plan: Basic metabolic panel, Magnesium, amLODipine (NORVASC) 5 MG tablet  Bilateral leg cramps - Plan: CK, Basic metabolic panel, Magnesium  PAD (peripheral artery disease) (HCC)  Good skin care, avoid trauma.   We have ordered labs or studies at this visit.  It can take up to 1-2 weeks for results and processing. IF results require follow up or explanation, we will call you with instructions. Clinically stable results will be released to your [facility]. If you have not heard from [place] or cannot find your results in [facility] in 2 weeks please contact our office at [phone].  If you are not yet signed up for [facility], please consider signing up  Please be sure medication list is accurate. If a new problem present, please set up appointment sooner than planned today.

## 2017-06-21 LAB — BASIC METABOLIC PANEL
BUN: 16 mg/dL (ref 7–25)
CALCIUM: 9.5 mg/dL (ref 8.6–10.4)
CO2: 25 mmol/L (ref 20–31)
CREATININE: 0.76 mg/dL (ref 0.60–0.88)
Chloride: 97 mmol/L — ABNORMAL LOW (ref 98–110)
Glucose, Bld: 82 mg/dL (ref 65–99)
Potassium: 4.3 mmol/L (ref 3.5–5.3)
Sodium: 135 mmol/L (ref 135–146)

## 2017-06-21 LAB — CK: Total CK: 85 U/L (ref 29–143)

## 2017-06-21 LAB — MAGNESIUM: MAGNESIUM: 2.3 mg/dL (ref 1.5–2.5)

## 2017-06-23 ENCOUNTER — Ambulatory Visit: Payer: Medicare Other | Admitting: Family Medicine

## 2017-06-27 ENCOUNTER — Ambulatory Visit: Payer: Medicare Other | Admitting: Family

## 2017-06-30 ENCOUNTER — Ambulatory Visit (INDEPENDENT_AMBULATORY_CARE_PROVIDER_SITE_OTHER): Payer: Medicare Other | Admitting: Surgery

## 2017-06-30 ENCOUNTER — Other Ambulatory Visit: Payer: Self-pay

## 2017-06-30 ENCOUNTER — Encounter: Payer: Self-pay | Admitting: Surgery

## 2017-06-30 VITALS — BP 134/88 | HR 72 | Temp 98.2°F | Resp 18 | Ht 61.0 in | Wt 103.0 lb

## 2017-06-30 DIAGNOSIS — I70213 Atherosclerosis of native arteries of extremities with intermittent claudication, bilateral legs: Secondary | ICD-10-CM

## 2017-06-30 NOTE — Progress Notes (Signed)
[facility]ist of [place]  Patient name: Carla Dawson MRN: [id number] DOB: July 04, 1931 Sex: female   REASON FOR VISIT:    Follow up  [place]:   This is an 83 year old female whom I met in 2017 for peripheral vascular disease.  [place] dealing with bilateral rest pain.  She underwent percutaneous revascularization of her right leg which was complicated by acute occlusion as well as perforation which required cover stenting.  This ultimately led to surgical revascularization which appears to be a right femoral to tibioperoneal trunk bypass graft.  On the left leg, the patient has undergone multiple percutaneous interventions including atherectomy and angioplasty of the superficial femoral and popliteal artery as well as angioplasty of anterior tibial artery.  In March 2018, ultrasound identified several elevated velocities within her left leg and therefore she underwent angiography where she had angioplasty of her left superficial femoral, popliteal, tibioperoneal trunk and posterior tibial artery.  Recently she has been suffering from severe pain in her left leg.  This keeps her up at night.  Her walking is significantly limited.  She does not have any open wounds.  She has recently been taken off her statin medication to see if this causes any improvement.  She is a former smoker.  She is medically managed for hypertension.  She continues to take aspirin and Plavix.   PAST MEDICAL HISTORY:   Past Medical History:  Diagnosis Date  . Allergy   . Arthritis   . Chicken pox   . Chronic bronchitis ([place])   . Hypertension      FAMILY HISTORY:   Family History  Problem Relation Age of Onset  . Arthritis Mother   . Hypertension Mother   . Arthritis Father   . Hypertension Father   . Cancer Brother   . Hypertension Son     SOCIAL HISTORY:   Social History  Substance Use  Topics  . Smoking status: Former Smoker    Packs/day: 1.50    Years: 52.00    Start date: 12/09/1953    Quit date: 12/09/2005  . Smokeless tobacco: Never Used  . Alcohol use No     ALLERGIES:   Allergies  Allergen Reactions  . Lortab [Hydrocodone-Acetaminophen] Shortness Of Breath and Itching    turns real red, took benadryl      CURRENT MEDICATIONS:   Current Outpatient Prescriptions  Medication Sig Dispense Refill  . albuterol (PROVENTIL) (2.5 MG/3ML) 0.083% nebulizer solution Take 3 mLs (2.5 mg total) by nebulization every 4 (four) hours as needed for wheezing or shortness of breath. 75 mL 12  . amLODipine (NORVASC) 5 MG tablet Take 1 tablet (5 mg total) by mouth daily. 90 tablet 2  . cetirizine (ZYRTEC) 10 MG tablet Take 10 mg by mouth every evening.     . clopidogrel (PLAVIX) 75 MG tablet TAKE 1 TABLET(75 MG) BY MOUTH DAILY 90 tablet 3  . Fiber, Guar Gum, CHEW Chew 1 tablet by mouth daily.     . fluticasone furoate-vilanterol (BREO ELLIPTA) 100-25 MCG/INH AEPB Inhale 1 puff into the lungs daily. 30 each 11  . gabapentin (NEURONTIN) 300 MG capsule Take 1 capsule (300 mg total) by mouth at bedtime. 30 capsule 2  . Multiple Vitamin (MULTIVITAMIN WITH MINERALS) TABS tablet Take 1 tablet by mouth every evening.     Marland Kitchen PROAIR HFA 108 (90 Base) MCG/ACT inhaler INHALE 2 PUFFS BY MOUTH EVERY 4 HOURS FOR WHEEZING (Patient taking  differently: INHALE 2 PUFFS BY MOUTH EVERY 4 HOURS AS NEEDED FOR WHEEZING) 8.5 g 1  . atorvastatin (LIPITOR) 10 MG tablet TK 1 T PO QD  5   No current facility-administered medications for this visit.     REVIEW OF SYSTEMS:   [X] denotes positive finding, [ ] denotes negative finding Cardiac  Comments:  Chest pain or chest pressure:    Shortness of breath upon exertion: x   Short of breath when lying flat:    Irregular heart rhythm:        Vascular    Pain in calf, thigh, or hip brought on by ambulation: x   Pain in feet at night that wakes you up from  your sleep:     Blood clot in your veins:    Leg swelling:  x       Pulmonary    Oxygen at home: x   Productive cough:     Wheezing:  x       Neurologic    Sudden weakness in arms or legs:     Sudden numbness in arms or legs:  x   Sudden onset of difficulty speaking or slurred speech:    Temporary loss of vision in one eye:     Problems with dizziness:         Gastrointestinal    Blood in stool:     Vomited blood:         Genitourinary    Burning when urinating:     Blood in urine:        Psychiatric    Major depression:         Hematologic    Bleeding problems:    Problems with blood clotting too easily:        Skin    Rashes or ulcers:        Constitutional    Fever or chills:      PHYSICAL EXAM:   Vitals:   06/30/17 1116  BP: 134/88  Pulse: 72  Resp: 18  Temp: 98.2 F (36.8 C)  SpO2: 94%  Weight: 103 lb (46.7 kg)  Height: 5' 1" (1.549 m)    GENERAL: The patient is a well-nourished female, in no acute distress. The vital signs are documented above. CARDIAC: There is a regular rate and rhythm.  VASCULAR: Pedal pulses are not palpable PULMONARY: Non-labored respirations MUSCULOSKELETAL: There are no major deformities or cyanosis. NEUROLOGIC: No focal weakness or paresthesias are detected. SKIN: There are no ulcers or rashes noted. PSYCHIATRIC: The patient has a normal affect.  STUDIES:   None  MEDICAL ISSUES:   The patient appears to have had progression of the disease in her left leg as evidenced by her clinical symptoms.  I have elected not to get a ABI/duplex before proceeding with angiography.  I'll plan on cannulation of her right leg, above her bypass graft and performing aortogram with bilateral runoff, as the patient is having some right leg issues as well.  Interventions will be for the left leg if indicated.  This is been scheduled for Tuesday, July 31    [name], MD [facility] of [facility] [phone] Pager [phone]

## 2017-07-08 ENCOUNTER — Other Ambulatory Visit: Payer: Self-pay

## 2017-07-08 ENCOUNTER — Encounter (HOSPITAL_COMMUNITY): Payer: Self-pay | Admitting: General Practice

## 2017-07-08 ENCOUNTER — Observation Stay (HOSPITAL_COMMUNITY)
Admission: RE | Admit: 2017-07-08 | Discharge: 2017-07-09 | Disposition: A | Discharge status: Home / Self Care | Payer: Medicare Other | Source: Ambulatory Visit | Attending: Surgery | Admitting: Surgery

## 2017-07-08 ENCOUNTER — Encounter (HOSPITAL_COMMUNITY)
Admission: RE | Disposition: A | Discharge status: Home / Self Care | Payer: Self-pay | Source: Ambulatory Visit | Attending: Surgery

## 2017-07-08 DIAGNOSIS — Z7902 Long term (current) use of antithrombotics/antiplatelets: Secondary | ICD-10-CM | POA: Diagnosis not present

## 2017-07-08 DIAGNOSIS — I10 Essential (primary) hypertension: Secondary | ICD-10-CM | POA: Diagnosis not present

## 2017-07-08 DIAGNOSIS — I70212 Atherosclerosis of native arteries of extremities with intermittent claudication, left leg: Secondary | ICD-10-CM | POA: Diagnosis not present

## 2017-07-08 DIAGNOSIS — T82856A Stenosis of peripheral vascular stent, initial encounter: Secondary | ICD-10-CM | POA: Diagnosis not present

## 2017-07-08 DIAGNOSIS — Z7982 Long term (current) use of aspirin: Secondary | ICD-10-CM | POA: Diagnosis not present

## 2017-07-08 DIAGNOSIS — I739 Peripheral vascular disease, unspecified: Secondary | ICD-10-CM | POA: Diagnosis present

## 2017-07-08 DIAGNOSIS — Z87891 Personal history of nicotine dependence: Secondary | ICD-10-CM | POA: Insufficient documentation

## 2017-07-08 DIAGNOSIS — Z95820 Peripheral vascular angioplasty status with implants and grafts: Secondary | ICD-10-CM

## 2017-07-08 DIAGNOSIS — E7800 Pure hypercholesterolemia, unspecified: Secondary | ICD-10-CM | POA: Diagnosis not present

## 2017-07-08 DIAGNOSIS — I70222 Atherosclerosis of native arteries of extremities with rest pain, left leg: Secondary | ICD-10-CM | POA: Diagnosis not present

## 2017-07-08 DIAGNOSIS — M19041 Primary osteoarthritis, right hand: Secondary | ICD-10-CM | POA: Insufficient documentation

## 2017-07-08 DIAGNOSIS — J449 Chronic obstructive pulmonary disease, unspecified: Secondary | ICD-10-CM | POA: Insufficient documentation

## 2017-07-08 DIAGNOSIS — M19042 Primary osteoarthritis, left hand: Secondary | ICD-10-CM | POA: Diagnosis not present

## 2017-07-08 DIAGNOSIS — Y831 Surgical operation with implant of artificial internal device as the cause of abnormal reaction of the patient, or of later complication, without mention of misadventure at the time of the procedure: Secondary | ICD-10-CM | POA: Insufficient documentation

## 2017-07-08 HISTORY — PX: ABDOMINAL AORTOGRAM W/LOWER EXTREMITY: CATH118223

## 2017-07-08 HISTORY — DX: Peripheral vascular disease, unspecified: I73.9

## 2017-07-08 HISTORY — DX: Pneumonia, unspecified organism: J18.9

## 2017-07-08 HISTORY — DX: Personal history of other medical treatment: Z92.89

## 2017-07-08 HISTORY — PX: PERIPHERAL VASCULAR INTERVENTION: CATH118257

## 2017-07-08 HISTORY — DX: Chronic obstructive pulmonary disease, unspecified: J44.9

## 2017-07-08 HISTORY — DX: Pure hypercholesterolemia, unspecified: E78.00

## 2017-07-08 LAB — POCT I-STAT, CHEM 8
BUN: 10 mg/dL (ref 6–20)
CREATININE: 0.6 mg/dL (ref 0.44–1.00)
Calcium, Ion: 1.22 mmol/L (ref 1.15–1.40)
Chloride: 103 mmol/L (ref 101–111)
GLUCOSE: 92 mg/dL (ref 65–99)
HCT: 34 % — ABNORMAL LOW (ref 36.0–46.0)
Hemoglobin: 11.6 g/dL — ABNORMAL LOW (ref 12.0–15.0)
Potassium: 4 mmol/L (ref 3.5–5.1)
Sodium: 141 mmol/L (ref 135–145)
TCO2: 28 mmol/L (ref 0–100)

## 2017-07-08 LAB — POCT ACTIVATED CLOTTING TIME
ACTIVATED CLOTTING TIME: 279 s
ACTIVATED CLOTTING TIME: 235 s
Activated Clotting Time: 180 seconds

## 2017-07-08 SURGERY — ABDOMINAL AORTOGRAM W/LOWER EXTREMITY
Anesthesia: LOCAL

## 2017-07-08 MED ORDER — PHENOL 1.4 % MT LIQD: 1.0000 | OROMUCOSAL | Status: DC | PRN

## 2017-07-08 MED ORDER — ALUM & MAG HYDROXIDE-SIMETH 200-200-20 MG/5ML PO SUSP: 15.0000 mL | ORAL | Status: DC | PRN

## 2017-07-08 MED ORDER — ONDANSETRON HCL 4 MG/2ML IJ SOLN
4.0000 mg | Freq: Four times a day (QID) | INTRAMUSCULAR | Status: DC | PRN
Start: 2017-07-08 — End: 2017-07-08

## 2017-07-08 MED ORDER — PANTOPRAZOLE SODIUM 40 MG PO TBEC
40.0000 mg | DELAYED_RELEASE_TABLET | Freq: Every day | ORAL | Status: DC
  Administered 2017-07-08: 21:00:00 40 mg via ORAL
  Filled 2017-07-08: qty 1

## 2017-07-08 MED ORDER — HYDRALAZINE HCL 20 MG/ML IJ SOLN: 5.0000 mg | INTRAMUSCULAR | Status: DC | PRN

## 2017-07-08 MED ORDER — SODIUM CHLORIDE 0.9 % IV SOLN: 500.0000 mL | Freq: Once | INTRAVENOUS | Status: DC | PRN

## 2017-07-08 MED ORDER — GUAIFENESIN-DM 100-10 MG/5ML PO SYRP: 15.0000 mL | ORAL_SOLUTION | ORAL | Status: DC | PRN

## 2017-07-08 MED ORDER — ANGIOPLASTY BOOK
Freq: Once | Status: DC
  Filled 2017-07-08: qty 1

## 2017-07-08 MED ORDER — SODIUM CHLORIDE 0.9 % IV SOLN
INTRAVENOUS | Status: DC
  Administered 2017-07-08: 12:00:00 via INTRAVENOUS

## 2017-07-08 MED ORDER — DOCUSATE SODIUM 100 MG PO CAPS
100.0000 mg | ORAL_CAPSULE | Freq: Every day | ORAL | Status: DC
  Administered 2017-07-09: 11:00:00 100 mg via ORAL
  Filled 2017-07-08: qty 1

## 2017-07-08 MED ORDER — SODIUM CHLORIDE 0.9 % IV SOLN: 1.0000 mL/kg/h | INTRAVENOUS | Status: DC

## 2017-07-08 MED ORDER — ATROPINE SULFATE 1 MG/10ML IJ SOSY
PREFILLED_SYRINGE | INTRAMUSCULAR | Status: AC
  Filled 2017-07-08: qty 10

## 2017-07-08 MED ORDER — MORPHINE SULFATE (PF) 4 MG/ML IV SOLN
2.0000 mg | INTRAVENOUS | Status: DC | PRN
  Administered 2017-07-08: 19:00:00 2 mg via INTRAVENOUS
  Filled 2017-07-08: qty 1

## 2017-07-08 MED ORDER — LIDOCAINE HCL (PF) 1 % IJ SOLN
INTRAMUSCULAR | Status: AC
  Filled 2017-07-08: qty 30

## 2017-07-08 MED ORDER — ADULT MULTIVITAMIN W/MINERALS CH
1.0000 | ORAL_TABLET | Freq: Every day | ORAL | Status: DC
Start: 2017-07-08 — End: 2017-07-09
  Administered 2017-07-08: 21:00:00 1 via ORAL
  Filled 2017-07-08: qty 1

## 2017-07-08 MED ORDER — MIDAZOLAM HCL 2 MG/2ML IJ SOLN
INTRAMUSCULAR | Status: DC | PRN
  Administered 2017-07-08: 1 mg via INTRAVENOUS

## 2017-07-08 MED ORDER — ATROPINE SULFATE 1 MG/ML IJ SOLN
0.5000 mg | Freq: Once | INTRAMUSCULAR | Status: AC
  Administered 2017-07-08: 0.5 mg via INTRAVENOUS

## 2017-07-08 MED ORDER — HEPARIN SODIUM (PORCINE) 1000 UNIT/ML IJ SOLN
INTRAMUSCULAR | Status: AC
  Filled 2017-07-08: qty 1

## 2017-07-08 MED ORDER — HEPARIN SODIUM (PORCINE) 1000 UNIT/ML IJ SOLN
INTRAMUSCULAR | Status: DC | PRN
  Administered 2017-07-08: 5000 [IU] via INTRAVENOUS

## 2017-07-08 MED ORDER — NITROGLYCERIN 1 MG/10 ML FOR IR/CATH LAB
INTRA_ARTERIAL | Status: DC | PRN
  Administered 2017-07-08: 300 mL via INTRA_ARTERIAL

## 2017-07-08 MED ORDER — PHENOL 1.4 % MT LIQD
1.0000 | OROMUCOSAL | Status: DC | PRN
  Filled 2017-07-08: qty 177

## 2017-07-08 MED ORDER — MIDAZOLAM HCL 2 MG/2ML IJ SOLN
INTRAMUSCULAR | Status: AC
  Filled 2017-07-08: qty 2

## 2017-07-08 MED ORDER — IODIXANOL 320 MG/ML IV SOLN
INTRAVENOUS | Status: DC | PRN
  Administered 2017-07-08: 155 mL via INTRA_ARTERIAL

## 2017-07-08 MED ORDER — POTASSIUM CHLORIDE CRYS ER 20 MEQ PO TBCR
20.0000 meq | EXTENDED_RELEASE_TABLET | Freq: Once | ORAL | Status: DC
  Filled 2017-07-08: qty 1

## 2017-07-08 MED ORDER — HEPARIN (PORCINE) IN NACL 2-0.9 UNIT/ML-% IJ SOLN
INTRAMUSCULAR | Status: AC | PRN
  Administered 2017-07-08: 1000 mL via INTRA_ARTERIAL

## 2017-07-08 MED ORDER — AMLODIPINE BESYLATE 5 MG PO TABS
5.0000 mg | ORAL_TABLET | Freq: Every day | ORAL | Status: DC
  Administered 2017-07-08: 5 mg via ORAL
  Filled 2017-07-08: qty 1

## 2017-07-08 MED ORDER — SODIUM CHLORIDE 0.9 % IV SOLN
INTRAVENOUS | Status: DC
  Administered 2017-07-08: 18:00:00 via INTRAVENOUS

## 2017-07-08 MED ORDER — ONDANSETRON HCL 4 MG/2ML IJ SOLN: 4.0000 mg | Freq: Four times a day (QID) | INTRAMUSCULAR | Status: DC | PRN

## 2017-07-08 MED ORDER — FENTANYL CITRATE (PF) 100 MCG/2ML IJ SOLN
INTRAMUSCULAR | Status: AC
  Filled 2017-07-08: qty 2

## 2017-07-08 MED ORDER — HEPARIN (PORCINE) IN NACL 2-0.9 UNIT/ML-% IJ SOLN
INTRAMUSCULAR | Status: AC
  Filled 2017-07-08: qty 1000

## 2017-07-08 MED ORDER — LABETALOL HCL 5 MG/ML IV SOLN: 10.0000 mg | INTRAVENOUS | Status: DC | PRN

## 2017-07-08 MED ORDER — GABAPENTIN 300 MG PO CAPS
300.0000 mg | ORAL_CAPSULE | Freq: Every day | ORAL | Status: DC
  Administered 2017-07-08: 300 mg via ORAL
  Filled 2017-07-08: qty 1

## 2017-07-08 MED ORDER — METOPROLOL TARTRATE 5 MG/5ML IV SOLN: 2.0000 mg | INTRAVENOUS | Status: DC | PRN

## 2017-07-08 MED ORDER — LIDOCAINE HCL (PF) 1 % IJ SOLN
INTRAMUSCULAR | Status: DC | PRN
  Administered 2017-07-08: 20 mL

## 2017-07-08 MED ORDER — FENTANYL CITRATE (PF) 100 MCG/2ML IJ SOLN
INTRAMUSCULAR | Status: DC | PRN
  Administered 2017-07-08: 25 ug via INTRAVENOUS

## 2017-07-08 MED ORDER — NITROGLYCERIN 1 MG/10 ML FOR IR/CATH LAB
INTRA_ARTERIAL | Status: AC
  Filled 2017-07-08: qty 10

## 2017-07-08 MED ORDER — METOPROLOL TARTRATE 5 MG/5ML IV SOLN
2.0000 mg | INTRAVENOUS | Status: DC | PRN
Start: 2017-07-08 — End: 2017-07-08

## 2017-07-08 SURGICAL SUPPLY — 23 items
BALLN COYOTE OTW 4X100X150 (BALLOONS) ×3
BALLN LUTONIX 4X150X130 (BALLOONS) ×3
BALLN LUTONIX DCB 4X100X130 (BALLOONS) ×3
BALLOON COYOTE OTW 4X100X150 (BALLOONS) ×2 IMPLANT
BALLOON LUTONIX 4X150X130 (BALLOONS) ×2 IMPLANT
BALLOON LUTONIX DCB 4X100X130 (BALLOONS) ×2 IMPLANT
CATH ANGIO 5F BER2 65CM (CATHETERS) ×3 IMPLANT
CATH OMNI FLUSH 5F 65CM (CATHETERS) ×3 IMPLANT
COVER PRB 48X5XTLSCP FOLD TPE (BAG) ×2 IMPLANT
COVER PROBE 5X48 (BAG) ×1
DEVICE CONTINUOUS FLUSH (MISCELLANEOUS) ×3 IMPLANT
DRAPE ZERO GRAVITY STERILE (DRAPES) ×3 IMPLANT
KIT ENCORE 26 ADVANTAGE (KITS) ×3 IMPLANT
KIT MICROINTRODUCER STIFF 5F (SHEATH) ×3 IMPLANT
KIT PV (KITS) ×3 IMPLANT
SHEATH FLEX ANSEL ANG 6F 45CM (SHEATH) ×3 IMPLANT
SHEATH PINNACLE 5F 10CM (SHEATH) ×3 IMPLANT
STENT INNOVA 6X80X130 (Permanent Stent) ×3 IMPLANT
SYR MEDRAD MARK V 150ML (SYRINGE) ×3 IMPLANT
TRANSDUCER W/STOPCOCK (MISCELLANEOUS) ×3 IMPLANT
TRAY PV CATH (CUSTOM PROCEDURE TRAY) ×3 IMPLANT
WIRE BENTSON .035X145CM (WIRE) ×6 IMPLANT
WIRE SPARTACORE .014X300CM (WIRE) ×3 IMPLANT

## 2017-07-08 NOTE — Progress Notes (Signed)
Client c/o being hot, color pale,heart rate 57 and b/p systolic 74; then client became unresponsive; atropine 0.5mg  given IV ; called rapid response and Dr Trula Slade

## 2017-07-08 NOTE — H&P (View-Only) (Signed)
 V[facility] and Vein Specialist of [place]  Patient name: Carla Dawson MRN: [id number] DOB: 12/19/1930 Sex: female   REASON FOR VISIT:    Follow up  HISOTRY OF PRESENT ILLNESS:   This is an 83-year-old female whom I met in 2017 for peripheral vascular disease.  [place], she was dealing with bilateral rest pain.  She underwent percutaneous revascularization of her right leg which was complicated by acute occlusion as well as perforation which required cover stenting.  This ultimately led to surgical revascularization which appears to be a right femoral to tibioperoneal trunk bypass graft.  On the left leg, the patient has undergone multiple percutaneous interventions including atherectomy and angioplasty of the superficial femoral and popliteal artery as well as angioplasty of anterior tibial artery.  In March 2018, ultrasound identified several elevated velocities within her left leg and therefore she underwent angiography where she had angioplasty of her left superficial femoral, popliteal, tibioperoneal trunk and posterior tibial artery.  Recently she has been suffering from severe pain in her left leg.  This keeps her up at night.  Her walking is significantly limited.  She does not have any open wounds.  She has recently been taken off her statin medication to see if this causes any improvement.  She is a former smoker.  She is medically managed for hypertension.  She continues to take aspirin and Plavix.   PAST MEDICAL HISTORY:   Past Medical History:  Diagnosis Date  . Allergy   . Arthritis   . Chicken pox   . Chronic bronchitis (HCC)   . Hypertension      FAMILY HISTORY:   Family History  Problem Relation Age of Onset  . Arthritis Mother   . Hypertension Mother   . Arthritis Father   . Hypertension Father   . Cancer Brother   . Hypertension Son     SOCIAL HISTORY:   Social History  Substance Use  Topics  . Smoking status: Former Smoker    Packs/day: 1.50    Years: 52.00    Start date: 12/09/1953    Quit date: 12/09/2005  . Smokeless tobacco: Never Used  . Alcohol use No     ALLERGIES:   Allergies  Allergen Reactions  . Lortab [Hydrocodone-Acetaminophen] Shortness Of Breath and Itching    turns real red, took benadryl      CURRENT MEDICATIONS:   Current Outpatient Prescriptions  Medication Sig Dispense Refill  . albuterol (PROVENTIL) (2.5 MG/3ML) 0.083% nebulizer solution Take 3 mLs (2.5 mg total) by nebulization every 4 (four) hours as needed for wheezing or shortness of breath. 75 mL 12  . amLODipine (NORVASC) 5 MG tablet Take 1 tablet (5 mg total) by mouth daily. 90 tablet 2  . cetirizine (ZYRTEC) 10 MG tablet Take 10 mg by mouth every evening.     . clopidogrel (PLAVIX) 75 MG tablet TAKE 1 TABLET(75 MG) BY MOUTH DAILY 90 tablet 3  . Fiber, Guar Gum, CHEW Chew 1 tablet by mouth daily.     . fluticasone furoate-vilanterol (BREO ELLIPTA) 100-25 MCG/INH AEPB Inhale 1 puff into the lungs daily. 30 each 11  . gabapentin (NEURONTIN) 300 MG capsule Take 1 capsule (300 mg total) by mouth at bedtime. 30 capsule 2  . Multiple Vitamin (MULTIVITAMIN WITH MINERALS) TABS tablet Take 1 tablet by mouth every evening.     . PROAIR HFA 108 (90 Base) MCG/ACT inhaler INHALE 2 PUFFS BY MOUTH EVERY 4 HOURS FOR WHEEZING (Patient taking   differently: INHALE 2 PUFFS BY MOUTH EVERY 4 HOURS AS NEEDED FOR WHEEZING) 8.5 g 1  . atorvastatin (LIPITOR) 10 MG tablet TK 1 T PO QD  5   No current facility-administered medications for this visit.     REVIEW OF SYSTEMS:   [X] denotes positive finding, [ ] denotes negative finding Cardiac  Comments:  Chest pain or chest pressure:    Shortness of breath upon exertion: x   Short of breath when lying flat:    Irregular heart rhythm:        Vascular    Pain in calf, thigh, or hip brought on by ambulation: x   Pain in feet at night that wakes you up from  your sleep:     Blood clot in your veins:    Leg swelling:  x       Pulmonary    Oxygen at home: x   Productive cough:     Wheezing:  x       Neurologic    Sudden weakness in arms or legs:     Sudden numbness in arms or legs:  x   Sudden onset of difficulty speaking or slurred speech:    Temporary loss of vision in one eye:     Problems with dizziness:         Gastrointestinal    Blood in stool:     Vomited blood:         Genitourinary    Burning when urinating:     Blood in urine:        Psychiatric    Major depression:         Hematologic    Bleeding problems:    Problems with blood clotting too easily:        Skin    Rashes or ulcers:        Constitutional    Fever or chills:      PHYSICAL EXAM:   Vitals:   06/30/17 1116  BP: 134/88  Pulse: 72  Resp: 18  Temp: 98.2 F (36.8 C)  SpO2: 94%  Weight: 103 lb (46.7 kg)  Height: 5' 1" (1.549 m)    GENERAL: The patient is a well-nourished female, in no acute distress. The vital signs are documented above. CARDIAC: There is a regular rate and rhythm.  VASCULAR: Pedal pulses are not palpable PULMONARY: Non-labored respirations MUSCULOSKELETAL: There are no major deformities or cyanosis. NEUROLOGIC: No focal weakness or paresthesias are detected. SKIN: There are no ulcers or rashes noted. PSYCHIATRIC: The patient has a normal affect.  STUDIES:   None  MEDICAL ISSUES:   The patient appears to have had progression of the disease in her left leg as evidenced by her clinical symptoms.  I have elected not to get a ABI/duplex before proceeding with angiography.  I'll plan on cannulation of her right leg, above her bypass graft and performing aortogram with bilateral runoff, as the patient is having some right leg issues as well.  Interventions will be for the left leg if indicated.  This is been scheduled for Tuesday, July 31    Wells Kamaryn Grimley, MD [facility] of [place] Tel [phone] Pager [phone] 

## 2017-07-08 NOTE — Progress Notes (Addendum)
Site area: RFA Site Prior to Removal:  Level 0 Pressure Applied For:23 min Manual:yes    Patient Status During Pull:  stable Post Pull Site:  Level 0 Post Pull Instructions Given:  yes Post Pull Pulses Present: doppler Dressing Applied:  tegaderm Bedrest begins @ 5400 till 1945 Comments:

## 2017-07-08 NOTE — Progress Notes (Signed)
Upon arrival in short stay raised area above sheath insertion site was noted.Pressure was applied x 25 min with area soft and non raised. Pt displayed vasovagal reaction and responded to atropine and small NS bolus. Bedrest begins at 1730.

## 2017-07-08 NOTE — Interval H&P Note (Signed)
History and Physical Interval Note:  07/08/2017 11:42 AM  Carla Dawson  has presented today for surgery, with the diagnosis of pvd with LLE rest pain  The various methods of treatment have been discussed with the patient and family. After consideration of risks, benefits and other options for treatment, the patient has consented to  Procedure(s): Abdominal Aortogram w/Lower Extremity (N/A) as a surgical intervention .  The patient's history has been reviewed, patient examined, no change in status, stable for surgery.  I have reviewed the patient's chart and labs.  Questions were answered to the patient's satisfaction.     [name]

## 2017-07-08 NOTE — Progress Notes (Signed)
Report called and transferred via bed to 6-c-4

## 2017-07-08 NOTE — Op Note (Signed)
Patient name: Carla Dawson MRN: [id number] DOB: Jan 28, 1931 Sex: female  07/08/2017 Pre-operative Diagnosis: In stent stenosis Post-operative diagnosis:  Same Surgeon:  [name] Procedure Performed:  1.  Ultrasound-guided access, right femoral artery  2.  Abdominal aortogram  3.  Bilateral lower extremity runoff  4.  Drug coated balloon and plasty, left superficial femoral artery  5.  Stent, left superficial femoral artery  6.  Conscious sedation of (68 minutes)  7.  Intra-arterial administration of nitroglycerin     Indications:  The patient has undergone multiple interventions in [place].  She has developed a recurrence of the stenosis within her left leg is here today for arteriogram.  Procedure:  The patient was identified in the holding area and taken to room 8.  The patient was then placed supine on the table and prepped and draped in the usual sterile fashion.  A time out was called.  Conscious sedation was administered with the use of IV fentanyl and Versed under continuous positioners monitoring.  Heart rate, blood pressure, and oxygen saturation continuously monitored.  Ultrasound was used to evaluate the right common femoral artery.  It was patent .  A digital ultrasound image was acquired.  A micropuncture needle was used to access the right common femoral artery under ultrasound guidance.  An 018 wire was advanced without resistance and a micropuncture sheath was placed.  The 018 wire was removed and a benson wire was placed.  The micropuncture sheath was exchanged for a 5 french sheath.  An omniflush catheter was advanced over the wire to the level of L-1.  An abdominal angiogram was obtained.  Next, using the omniflush catheter and a benson wire, the aortic bifurcation was crossed and the catheter was placed into theleft external iliac artery and left runoff was obtained.  right runoff was performed via retrograde sheath injections.  Findings:   Aortogram:  No  significant renal artery stenosis.  The abdominal aorta is widely patent.  Bilateral common and external iliac arteries widely patent.  Right Lower Extremity:  Right common femoral and proximal femoral artery are patent.  The superficial femoral artery is occluded.  There is a bypass graft originating in the common femoral artery to the tibioperoneal trunk.  No significant anastomotic stenosis is identified.  Diffuse tibial disease identified  Left Lower Extremity:  Left common femoral and profunda femoral artery are diseased but patent.  There is mild narrowing at the distal common femoral artery.  The superficial femoral artery is extremely small and has diffuse narrowing throughout.  There are stents within the mid SFA with greater than 80% stenosis throughout.  The popliteal artery below the stent is also very small in caliber with mild to moderate stenosis.  There is two-vessel runoff via posterior tibial and peroneal artery.  Intervention:  After the above images were acquired, the decision made to proceed with intervention.  Over a wire, the 6 French 45 cm sheath was advanced in the left external iliac artery.  The patient's fully heparinized.  600 g of nitroglycerin was a minister through the sheath.  I then used a 014 [place] core wire to cross the stenosis within the stents.  My plan was initially to perform drug coated balloon angioplasty.  I felt like there were areas proximal and distal to the stents that needed to be addressed.  A 4 mm balloon was used to treat these areas of total distance of approximately 200 cm.  Completion imaging showed improvement in the  stenosis, however there were dissections in the native artery proximal and distal to the stent.  I then selected a 4 x 1 50 and a 4 x 100 drug coated Lutonix balloon and performed the critical balloon and plasty of these areas with prolonged inflation of 2 minutes and 30 seconds.  Follow-up imaging revealed improvement of the dissection below  the stent however proximally there was still a dissection that I was not comfortable leaving alone.  I therefore selected a 6 x 80 INNOVA self expanding stent and deployed this overlapping with the previously placed stents.  It was molded using a 4 mm balloon.  Follow-up imaging revealed no residual dissection and no evidence of stenosis within the treated area.  Catheters and wires removed.  Sheath was withdrawn to the right external iliac artery.  The patient taken the holding area for sheath pull once correlation profile corrects.  Impression:  #1  in-stent stenosis in the left superficial femoral and popliteal artery.  This area was treated with a drug-coated 4 mm balloon.  I also treated proximal and distal to the stents because of the size of the artery.  There was a nonflow limiting dissection proximally and distally.  The distal dissection was able to be tacked down with prolonged balloon inflation.  Approximately had to extend the stents using a 6 x 80.  #2  the patient has extremely small vessels.  I would be reluctant to proceed with any additional interventions percutaneous standpoint as I think their durability is very limited.  #3  patent right femoral tibial peroneal trunk bypass.    Theotis Burrow, M.D. [facility] [place] Office: [phone] Pager:  [phone]

## 2017-07-08 NOTE — Progress Notes (Signed)
Rapid response called as patient became agonal after arriving to short stay.  She had a right groin hematoma.  The area is soft.  SHe has responded to IV fluid and looks good now.  No significant hematoma. Will continue to hold pressure and admit overnight.    [facility] [employer]

## 2017-07-08 NOTE — Progress Notes (Signed)
Client c/o feeling hot and hr 57 and sys b/p 74 and diaphoretic and color pale; client became unresponsive and called rapid response nurse and Dr Trula Slade and client given atropine and heart rate increased to 90 and b/p 138/ 58

## 2017-07-08 NOTE — Progress Notes (Signed)
Client received from cath lab and c/o feeling knot right groin and large hematoma noted and Chip Anderson,RN in and holding pressure right groin

## 2017-07-09 ENCOUNTER — Encounter (HOSPITAL_COMMUNITY): Payer: Self-pay | Admitting: Surgery

## 2017-07-09 DIAGNOSIS — T82856A Stenosis of peripheral vascular stent, initial encounter: Secondary | ICD-10-CM | POA: Diagnosis not present

## 2017-07-09 DIAGNOSIS — M19042 Primary osteoarthritis, left hand: Secondary | ICD-10-CM | POA: Diagnosis not present

## 2017-07-09 DIAGNOSIS — I70222 Atherosclerosis of native arteries of extremities with rest pain, left leg: Secondary | ICD-10-CM | POA: Diagnosis not present

## 2017-07-09 DIAGNOSIS — J449 Chronic obstructive pulmonary disease, unspecified: Secondary | ICD-10-CM | POA: Diagnosis not present

## 2017-07-09 DIAGNOSIS — E7800 Pure hypercholesterolemia, unspecified: Secondary | ICD-10-CM | POA: Diagnosis not present

## 2017-07-09 DIAGNOSIS — I10 Essential (primary) hypertension: Secondary | ICD-10-CM | POA: Diagnosis not present

## 2017-07-09 LAB — COMPREHENSIVE METABOLIC PANEL
ALT: 8 U/L — AB (ref 14–54)
AST: 18 U/L (ref 15–41)
Albumin: 3 g/dL — ABNORMAL LOW (ref 3.5–5.0)
Alkaline Phosphatase: 68 U/L (ref 38–126)
Anion gap: 3 — ABNORMAL LOW (ref 5–15)
BILIRUBIN TOTAL: 0.5 mg/dL (ref 0.3–1.2)
BUN: 10 mg/dL (ref 6–20)
CALCIUM: 8.9 mg/dL (ref 8.9–10.3)
CO2: 28 mmol/L (ref 22–32)
CREATININE: 0.67 mg/dL (ref 0.44–1.00)
Chloride: 105 mmol/L (ref 101–111)
GFR calc Af Amer: >60 mL/min (ref >60)
Glucose, Bld: 102 mg/dL — ABNORMAL HIGH (ref 65–99)
Potassium: 3.9 mmol/L (ref 3.5–5.1)
Sodium: 136 mmol/L (ref 135–145)
TOTAL PROTEIN: 5 g/dL — AB (ref 6.5–8.1)

## 2017-07-09 LAB — CBC
HEMATOCRIT: 30.3 % — AB (ref 36.0–46.0)
Hemoglobin: 9.9 g/dL — ABNORMAL LOW (ref 12.0–15.0)
MCH: 28.4 pg (ref 26.0–34.0)
MCHC: 32.7 g/dL (ref 30.0–36.0)
MCV: 87.1 fL (ref 78.0–100.0)
Platelets: 239 10*3/uL (ref 150–400)
RBC: 3.48 MIL/uL — AB (ref 3.87–5.11)
RDW: 13.3 % (ref 11.5–15.5)
WBC: 8 10*3/uL (ref 4.0–10.5)

## 2017-07-09 NOTE — Progress Notes (Signed)
Pt and daughter given all discharge instructions and they both verbalized understanding.  All questions discussed and answered.  Pt discharged via wc home with daughter. All belongings with daughter. Pt without any pain at time of discharge.

## 2017-07-09 NOTE — Care Management Note (Signed)
Case Management Note  Patient Details  Name: Carla Dawson MRN: [id number] Date of Birth: 1931/04/09  Subjective/Objective:   From home with daughter, she has a rolling walker, nebulizer machine and oxygen with [facility] , she does not use the walker.  She has PAD ,s/p PV intervention, she was previously on plavix pta.  She is for dc to home today.  No other needs.                 Action/Plan:   Expected Discharge Date:  07/09/17               Expected Discharge Plan:  Home/Self Care  In-House Referral:     Discharge planning Services  CM Consult  Post Acute Care Choice:    Choice offered to:     DME Arranged:    DME Agency:     HH Arranged:    HH Agency:     Status of Service:  Completed, signed off  If discussed at [employer] of Stay Meetings, dates discussed:    Additional Comments:  Zenon Mayo, RN 07/09/2017, 10:10 AM

## 2017-07-09 NOTE — Progress Notes (Signed)
[facility] [place]  Subjective  - Doing well ambulated to the bathroom x 2, tolerated breakfast, and no more nausea reported.   Objective 131/61 87 98.6 F (37 C) (Oral) (!) 23 94%  Intake/Output Summary (Last 24 hours) at 07/09/17 0812 Last data filed at 07/09/17 0020  Gross per 24 hour  Intake              225 ml  Output              225 ml  Net                0 ml    Heart RRR Lungs non labored breathing, chronic cough Right groin soft without hematoma, minimal tenderness right LQ s/p pressure post op.  Doppler PT/DP left > right signals  Assessment/Planning: POD # 1 angiogram Procedure Performed:             1.  Ultrasound-guided access, right femoral artery             2.  Abdominal aortogram             3.  Bilateral lower extremity runoff             4.  Drug coated balloon and plasty, left superficial femoral artery             5.  Stent, left superficial femoral artery             6.  Conscious sedation of (68 minutes)             7.  Intra-arterial administration of nitroglycerin  Impression:             #1  in-stent stenosis in the left superficial femoral and popliteal artery.  This area was treated with a drug-coated 4 mm balloon.  I also treated proximal and distal to the stents because of the size of the artery.  There was a nonflow limiting dissection proximally and distally.  The distal dissection was able to be tacked down with prolonged balloon inflation.  Approximately had to extend the stents using a 6 x 80.             #2  the patient has extremely small vessels.  I would be reluctant to proceed with any additional interventions percutaneous standpoint as I think their durability is very limited.             #3  patent right femoral tibial peroneal trunk bypass.  Post op rapid response called patient loss consciousness. Stable disposition, tolerating PO's, no nausea, ambulating.   Likely D/C home today    Laurence Slate  [facility] 07/09/2017 8:12 AM --  Laboratory Lab Results:  Recent Labs  07/08/17 1202 07/09/17 0453  WBC  --  8.0  HGB 11.6* 9.9*  HCT 34.0* 30.3*  PLT  --  239   BMET  Recent Labs  07/08/17 1202 07/09/17 0453  NA 141 136  K 4.0 3.9  CL 103 105  CO2  --  28  GLUCOSE 92 102*  BUN 10 10  CREATININE 0.60 0.67  CALCIUM  --  8.9    COAG No results found for: INR, PROTIME No results found for: PTT

## 2017-07-09 NOTE — Care Management Obs Status (Signed)
[place] NOTIFICATION   Patient Details  Name: Carla Dawson MRN: [id number] Date of Birth: 01-27-1931   Medicare Observation Status Notification Given:  Yes    Zenon Mayo, RN 07/09/2017, 10:09 AM

## 2017-07-14 ENCOUNTER — Ambulatory Visit: Payer: Medicare Other | Admitting: Surgery

## 2017-07-21 NOTE — Discharge Summary (Signed)
[facility] Discharge Summary   Patient ID:  Carla Dawson MRN: [id number] DOB/AGE: 07/29/1931 83 y.o.  Admit date: 07/08/2017 Discharge date: 07/09/2017 Date of Surgery: 07/08/2017 Surgeon: Surgeon(s): Serafina Mitchell, MD  Admission Diagnosis: pvd with LLE rest pain  Discharge Diagnoses:  pvd with LLE rest pain  Secondary Diagnoses: Past Medical History:  Diagnosis Date  . Allergy    "maybe seasonal allergies" (07/08/2017)  . Arthritis    "hands" (07/08/2017)  . Chicken pox   . COPD (chronic obstructive pulmonary disease) ([place])   . High cholesterol    "took me off pills ~ 1-2 months ago" (07/08/2017)  . History of blood transfusion 2017   "when I had blood clot in my leg"  . Hypertension   . PAD (peripheral artery disease) ([place])    severe/notes 10/14/2016  . Pneumonia ~ 2015  . PVD (peripheral vascular disease) ([facility])     Procedure(s): Abdominal Aortogram w/Lower Extremity Peripheral Vascular Intervention  Discharged Condition: good  HPI: This is an 83 year old female whom I met in 2017 for peripheral vascular disease.  Previously, in another state, she was dealing with bilateral rest pain.  She underwent percutaneous revascularization of her right leg which was complicated by acute occlusion as well as perforation which required cover stenting.  This ultimately led to surgical revascularization which appears to be a right femoral to tibioperoneal trunk bypass graft.  On the left leg, the patient has undergone multiple percutaneous interventions including atherectomy and angioplasty of the superficial femoral and popliteal artery as well as angioplasty of anterior tibial artery.  In March 2018, ultrasound identified several elevated velocities within her left leg and therefore she underwent angiography where she had angioplasty of her left superficial femoral, popliteal, tibioperoneal trunk and posterior tibial artery.  Recently she has been  suffering from severe pain in her left leg.  This keeps her up at night.  Her walking is significantly limited.  She does not have any open wounds.  She has recently been taken off her statin medication to see if this causes any improvement.  She is a former smoker.  She is medically managed for hypertension.  She continues to take aspirin and Plavix.   Hospital Course:  Carla Dawson is a 83 y.o. female is S/P Procedure(s): Abdominal Aortogram w/Lower Extremity Peripheral Vascular Intervention Findings:              Aortogram:  No significant renal artery stenosis.  The abdominal aorta is widely patent.  Bilateral common and external iliac arteries widely patent.             Right Lower Extremity:  Right common femoral and proximal femoral artery are patent.  The superficial femoral artery is occluded.  There is a bypass graft originating in the common femoral artery to the tibioperoneal trunk.  No significant anastomotic stenosis is identified.  Diffuse tibial disease identified             Left Lower Extremity:  Left common femoral and profunda femoral artery are diseased but patent.  There is mild narrowing at the distal common femoral artery.  The superficial femoral artery is extremely small and has diffuse narrowing throughout.  There are stents within the mid SFA with greater than 80% stenosis throughout.  The popliteal artery below the stent is also very small in caliber with mild to moderate stenosis.  There is two-vessel runoff via posterior tibial and peroneal artery.  Intervention:  After the above  images were acquired, the decision made to proceed with intervention.  Over a wire, the 6 French 45 cm sheath was advanced in the left external iliac artery.  The patient's fully heparinized.  600 g of nitroglycerin was a minister through the sheath.  I then used a 014 [place] core wire to cross the stenosis within the stents.  My plan was initially to perform drug coated balloon  angioplasty.  I felt like there were areas proximal and distal to the stents that needed to be addressed.  A 4 mm balloon was used to treat these areas of total distance of approximately 200 cm.  Completion imaging showed improvement in the stenosis, however there were dissections in the native artery proximal and distal to the stent.  I then selected a 4 x 1 50 and a 4 x 100 drug coated Lutonix balloon and performed the critical balloon and plasty of these areas with prolonged inflation of 2 minutes and 30 seconds.  Follow-up imaging revealed improvement of the dissection below the stent however proximally there was still a dissection that I was not comfortable leaving alone.  I therefore selected a 6 x 80 INNOVA self expanding stent and deployed this overlapping with the previously placed stents.  It was molded using a 4 mm balloon.  Follow-up imaging revealed no residual dissection and no evidence of stenosis within the treated area.  Catheters and wires removed.  Sheath was withdrawn to the right external iliac artery.  The patient taken the holding area for sheath pull once correlation profile corrects.  Impression:             #1  in-stent stenosis in the left superficial femoral and popliteal artery.  This area was treated with a drug-coated 4 mm balloon.  I also treated proximal and distal to the stents because of the size of the artery.  There was a nonflow limiting dissection proximally and distally.  The distal dissection was able to be tacked down with prolonged balloon inflation.  Approximately had to extend the stents using a 6 x 80.             #2  the patient has extremely small vessels.  I would be reluctant to proceed with any additional interventions percutaneous standpoint as I think their durability is very limited.             #3  patent right femoral tibial peroneal trunk bypass.              Post op rapid response called patient loss consciousness. Stable disposition, tolerating  PO's, no nausea, ambulating.   Likely D/C home today POD # 1 in stable condition     Significant Diagnostic Studies: CBC Lab Results  Component Value Date   WBC 8.0 07/09/2017   HGB 9.9 (L) 07/09/2017   HCT 30.3 (L) 07/09/2017   MCV 87.1 07/09/2017   PLT 239 07/09/2017    BMET    Component Value Date/Time   NA 136 07/09/2017 0453   NA 141 04/23/2013   K 3.9 07/09/2017 0453   CL 105 07/09/2017 0453   CO2 28 07/09/2017 0453   GLUCOSE 102 (H) 07/09/2017 0453   BUN 10 07/09/2017 0453   BUN 13 04/23/2013   CREATININE 0.67 07/09/2017 0453   CREATININE 0.76 06/20/2017 1534   CALCIUM 8.9 07/09/2017 0453   GFRNONAA >60 07/09/2017 0453   GFRAA >60 07/09/2017 0453   COAG No results found for: INR, PROTIME   Disposition:  Discharge to :Home Discharge Instructions    Call MD for:  redness, tenderness, or signs of infection (pain, swelling, bleeding, redness, odor or green/yellow discharge around incision site)    Complete by:  As directed    Call MD for:  severe or increased pain, loss or decreased feeling  in affected limb(s)    Complete by:  As directed    Call MD for:  temperature >100.5    Complete by:  As directed    Discharge instructions    Complete by:  As directed    You may shower as needed.   Driving Restrictions    Complete by:  As directed    No driving for 2 weeks   Increase activity slowly    Complete by:  As directed    Walk with assistance use walker or cane as needed   Lifting restrictions    Complete by:  As directed    No heavy lifting for 3 weeks   Resume previous diet    Complete by:  As directed      Allergies as of 07/09/2017      Reactions   Lortab [hydrocodone-acetaminophen] Shortness Of Breath, Itching   turns real red, took benadryl       Medication List    TAKE these medications   amLODipine 5 MG tablet Commonly known as:  NORVASC Take 1 tablet (5 mg total) by mouth daily. What changed:  when to take this   atorvastatin 10 MG  tablet Commonly known as:  LIPITOR TK 1 T PO QD   cetirizine 10 MG tablet Commonly known as:  ZYRTEC Take 10 mg by mouth daily as needed for allergies.   clopidogrel 75 MG tablet Commonly known as:  PLAVIX TAKE 1 TABLET(75 MG) BY MOUTH DAILY What changed:  additional instructions   famotidine 10 MG tablet Commonly known as:  PEPCID Take 10 mg by mouth daily as needed for heartburn or indigestion.   fluticasone furoate-vilanterol 100-25 MCG/INH Aepb Commonly known as:  BREO ELLIPTA Inhale 1 puff into the lungs daily.   gabapentin 300 MG capsule Commonly known as:  NEURONTIN Take 1 capsule (300 mg total) by mouth at bedtime.   multivitamin with minerals Tabs tablet Take 1 tablet by mouth every evening.   PROAIR HFA 108 (90 Base) MCG/ACT inhaler Generic drug:  albuterol INHALE 2 PUFFS BY MOUTH EVERY 4 HOURS FOR WHEEZING What changed:  See the new instructions.   albuterol (2.5 MG/3ML) 0.083% nebulizer solution Commonly known as:  PROVENTIL Take 3 mLs (2.5 mg total) by nebulization every 4 (four) hours as needed for wheezing or shortness of breath. What changed:  Another medication with the same name was changed. Make sure you understand how and when to take each.   SOLUBLE FIBER/PROBIOTICS Chew Chew 1 capsule by mouth daily.      Verbal and written Discharge instructions given to the patient. Wound care per Discharge AVS Follow-up Information    Serafina Mitchell, MD .   Specialties:  Vascular Surgery, Cardiology Contact information: [address] [place] [address] [phone]           Signed: Laurence Slate [facility] 07/21/2017, 6:00 PM

## 2017-07-30 ENCOUNTER — Ambulatory Visit: Payer: Medicare Other | Admitting: Family Medicine

## 2017-08-18 DIAGNOSIS — J0100 Acute maxillary sinusitis, unspecified: Secondary | ICD-10-CM | POA: Diagnosis not present

## 2017-08-18 DIAGNOSIS — I10 Essential (primary) hypertension: Secondary | ICD-10-CM | POA: Diagnosis not present

## 2017-08-18 DIAGNOSIS — Z6820 Body mass index (BMI) 20.0-20.9, adult: Secondary | ICD-10-CM | POA: Diagnosis not present

## 2017-08-28 ENCOUNTER — Encounter: Payer: Self-pay | Admitting: Family Medicine

## 2017-09-04 ENCOUNTER — Ambulatory Visit: Payer: Medicare Other | Admitting: Family Medicine

## 2017-09-08 ENCOUNTER — Encounter (HOSPITAL_COMMUNITY): Payer: Medicare Other

## 2017-09-08 ENCOUNTER — Ambulatory Visit: Payer: Medicare Other | Admitting: Family

## 2017-09-10 ENCOUNTER — Encounter: Payer: Self-pay | Admitting: Family

## 2017-09-10 ENCOUNTER — Ambulatory Visit (INDEPENDENT_AMBULATORY_CARE_PROVIDER_SITE_OTHER): Payer: Medicare Other | Admitting: Family

## 2017-09-10 ENCOUNTER — Ambulatory Visit (HOSPITAL_COMMUNITY)
Admission: RE | Admit: 2017-09-10 | Discharge: 2017-09-10 | Disposition: A | Discharge status: Home / Self Care | Payer: Medicare Other | Source: Ambulatory Visit | Attending: Surgery | Admitting: Surgery

## 2017-09-10 ENCOUNTER — Ambulatory Visit (INDEPENDENT_AMBULATORY_CARE_PROVIDER_SITE_OTHER)
Admission: RE | Admit: 2017-09-10 | Discharge: 2017-09-10 | Disposition: A | Discharge status: Home / Self Care | Payer: Medicare Other | Source: Ambulatory Visit | Attending: Surgery | Admitting: Surgery

## 2017-09-10 VITALS — BP 142/67 | HR 67 | Temp 97.5°F | Resp 16 | Ht 61.0 in | Wt 107.0 lb

## 2017-09-10 DIAGNOSIS — Z87891 Personal history of nicotine dependence: Secondary | ICD-10-CM

## 2017-09-10 DIAGNOSIS — Z95820 Peripheral vascular angioplasty status with implants and grafts: Secondary | ICD-10-CM | POA: Diagnosis not present

## 2017-09-10 DIAGNOSIS — I70213 Atherosclerosis of native arteries of extremities with intermittent claudication, bilateral legs: Secondary | ICD-10-CM

## 2017-09-10 DIAGNOSIS — Z9862 Peripheral vascular angioplasty status: Secondary | ICD-10-CM | POA: Diagnosis not present

## 2017-09-10 DIAGNOSIS — R9439 Abnormal result of other cardiovascular function study: Secondary | ICD-10-CM | POA: Insufficient documentation

## 2017-09-10 DIAGNOSIS — I739 Peripheral vascular disease, unspecified: Secondary | ICD-10-CM

## 2017-09-10 DIAGNOSIS — I771 Stricture of artery: Secondary | ICD-10-CM | POA: Diagnosis not present

## 2017-09-10 LAB — VAS US LOWER EXTREMITY ARTERIAL DUPLEX
LATIBDISTSYS: 22 cm/s
LSFDPSV: -250 cm/s
LSFMPSV: -137 cm/s
Left super femoral prox sys PSV: -178 cm/s
left post tibial dist sys: -65 cm/s

## 2017-09-10 NOTE — Patient Instructions (Signed)

## 2017-09-10 NOTE — Progress Notes (Signed)
Postoperative Visit   History of Present Illness  Carla Dawson is a 83 y.o. female who is s/p abdominal aortogram with bilateral lower extremity runoff, drug coated balloon angioplasty of left superficial femoral artery, and stent placed in left superficial femoral artery on 07-08-17 by Dr. Trula Slade. Pt returns today for follow up.  The patient has undergone multiple interventions in [place].  She developed a recurrence of the stenosis within her left leg, aortogram then performed.  Dr. Trula Slade documents in his aortogram note that the patient has extremely small vessels; he would be reluctant to proceed with any additional interventions percutaneous standpoint as Dr. Trula Slade thinks their durability is very limited.  Since the July stenting of left SFA, she no longer has rest pain in her left calf, and can walk as far as she wants with no claudication in her left leg.  She has tingling and numbness in her right lower leg and foot that seems to be improving.   She has no open wounds. The patient is able to complete their activities of daily living.    She moved from [place] to live with her daughter.  Past medical history: COPD managed with inhalers, Plavix daily s/p stent placement, and HTN managed with Norvasc. She states she has no history of CAD or DM.   She denies any known history of stroke.  Pt Diabetic: No Pt smoker: former smoker, quit in 2007, smoked x 52years  Pt meds include: Statin : atorvastatin 10 mg Betablocker: No ASA: No Other anticoagulants/antiplatelets: Plavix    For VQI Use Only  PRE-ADM LIVING: Home  AMB STATUS: Ambulatory   Past Medical History:  Diagnosis Date  . Allergy    "maybe seasonal allergies" (07/08/2017)  . Arthritis    "hands" (07/08/2017)  . Chicken pox   . COPD (chronic obstructive pulmonary disease) ([place])   . High cholesterol    "took me off pills ~ 1-2 months ago" (07/08/2017)  . History of blood transfusion 2017     "when I had blood clot in my leg"  . Hypertension   . PAD (peripheral artery disease) ([place])    severe/notes 10/14/2016  . Pneumonia ~ 2015  . PVD (peripheral vascular disease) ([place])     Past Surgical History:  Procedure Laterality Date  . ABDOMINAL AORTOGRAM N/A 04/22/2017   Procedure: Abdominal Aortogram;  Surgeon: Serafina Mitchell, MD;  Location: [facility];  Service: Cardiovascular;  Laterality: N/A;  . ABDOMINAL AORTOGRAM W/LOWER EXTREMITY N/A 03/04/2017   Procedure: Abdominal Aortogram w/Lower Extremity;  Surgeon: Serafina Mitchell, MD;  Location: [facility];  Service: Cardiovascular;  Laterality: N/A;  . ABDOMINAL AORTOGRAM W/LOWER EXTREMITY N/A 07/08/2017   Procedure: Abdominal Aortogram w/Lower Extremity;  Surgeon: Serafina Mitchell, MD;  Location: [facility];  Service: Cardiovascular;  Laterality: N/A;  . BIOPSY THYROID    . CATARACT EXTRACTION W/ INTRAOCULAR LENS  IMPLANT, BILATERAL Bilateral   . HEMATOMA EVACUATION Right 2017   "S/P procedure; discharged; had to come back for emergency OR"  . LOWER EXTREMITY ANGIOGRAPHY Bilateral 04/22/2017   Procedure: Lower Extremity Angiography;  Surgeon: Serafina Mitchell, MD;  Location: [facility];  Service: Cardiovascular;  Laterality: Bilateral;  . PERIPHERAL VASCULAR BALLOON ANGIOPLASTY Left 03/04/2017   Procedure: Peripheral Vascular Balloon Angioplasty;  Surgeon: Serafina Mitchell, MD;  Location: [facility];  Service: Cardiovascular;  Laterality: Left;  SFA and PT  . PERIPHERAL VASCULAR INTERVENTION  07/08/2017  .  PERIPHERAL VASCULAR INTERVENTION  07/08/2017   Procedure: Peripheral Vascular Intervention;  Surgeon: Serafina Mitchell, MD;  Location: [facility];  Service: Cardiovascular;;  . VAGINAL HYSTERECTOMY    . VEIN BYPASS SURGERY      Social History   Social History  . Marital status: Divorced    Spouse name: N/A  . Number of children: N/A  . Years of education: N/A   Occupational  History  . Not on file.   Social History Main Topics  . Smoking status: Former Smoker    Packs/day: 1.50    Years: 52.00    Start date: 12/09/1953    Quit date: 12/09/2005  . Smokeless tobacco: Never Used  . Alcohol use No  . Drug use: No  . Sexual activity: Not Currently   Other Topics Concern  . Not on file   Social History Narrative  . No narrative on file    Allergies  Allergen Reactions  . Lortab [Hydrocodone-Acetaminophen] Shortness Of Breath and Itching    turns real red, took benadryl     Current Outpatient Prescriptions on File Prior to Visit  Medication Sig Dispense Refill  . albuterol (PROVENTIL) (2.5 MG/3ML) 0.083% nebulizer solution Take 3 mLs (2.5 mg total) by nebulization every 4 (four) hours as needed for wheezing or shortness of breath. 75 mL 12  . amLODipine (NORVASC) 5 MG tablet Take 1 tablet (5 mg total) by mouth daily. (Patient taking differently: Take 5 mg by mouth at bedtime. ) 90 tablet 2  . atorvastatin (LIPITOR) 10 MG tablet TK 1 T PO QD  5  . cetirizine (ZYRTEC) 10 MG tablet Take 10 mg by mouth daily as needed for allergies.     Marland Kitchen clopidogrel (PLAVIX) 75 MG tablet TAKE 1 TABLET(75 MG) BY MOUTH DAILY (Patient taking differently: TAKE 1 TABLET(75 MG) BY MOUTH DAILY AT NIGHT) 90 tablet 3  . famotidine (PEPCID) 10 MG tablet Take 10 mg by mouth daily as needed for heartburn or indigestion.    . fluticasone furoate-vilanterol (BREO ELLIPTA) 100-25 MCG/INH AEPB Inhale 1 puff into the lungs daily. 30 each 11  . gabapentin (NEURONTIN) 300 MG capsule Take 1 capsule (300 mg total) by mouth at bedtime. 30 capsule 2  . Multiple Vitamin (MULTIVITAMIN WITH MINERALS) TABS tablet Take 1 tablet by mouth every evening.     Marland Kitchen PROAIR HFA 108 (90 Base) MCG/ACT inhaler INHALE 2 PUFFS BY MOUTH EVERY 4 HOURS FOR WHEEZING (Patient taking differently: INHALE 2 PUFFS BY MOUTH EVERY 4 HOURS AS NEEDED FOR WHEEZING) 8.5 g 1  . Probiotic Product (SOLUBLE FIBER/PROBIOTICS) CHEW Chew 1  capsule by mouth daily.     No current facility-administered medications on file prior to visit.       Physical Examination  Vitals:   09/10/17 1440 09/10/17 1443  BP: 134/73 (!) 142/67  Pulse: 67   Resp: 16   Temp: (!) 97.5 F (36.4 C)   TempSrc: Oral   SpO2: 94%   Weight: 107 lb (48.5 kg)   Height: 5\' 1"  (1.549 m)    Body mass index is 20.22 kg/m.  PHYSICAL EXAMINATION: General: A&O x 3, WDWN, thin elderly female. Gait: normal Eyes: PERRLA. Pulmonary: Respirations are mildly labored at rest with some use of accessory muscles, occasional cough, limitedair movement, no rales, rhonchi, or wheezes.  Cardiac: regular rhythm, no detected murmur.    Carotid Bruits Right Left   Negative Negative   Abdominal aortic pulse is notpalpable. Radial pulses: 2+ palpable  and =  VASCULAR EXAM: Extremitieswithoutischemic changes, withoutGangrene; withoutopen wounds. Feet are cool to touch but pink, all toes are pink with brisk capillary refill.   LE Pulses Right Left  FEMORAL palpable palpable   POPLITEAL notpalpable  notpalpable  POSTERIOR TIBIAL notpalpable  notpalpable   DORSALIS PEDIS ANTERIOR TIBIAL faintlypalpable  1+palpable    Abdomen: soft, NT, no palpable masses. Skin: no rashes, no ulcers noted. Musculoskeletal: Age appropriate muscle wasting andatrophy. Moderate kyphosis.  Neurologic: A&O X 3; Appropriate Affect,MOTOR FUNCTION: moving all extremities equally, motor strength 4/5 throughout. Speech is fluent/normal. CN 2-12 intact.  DATA  Bilateral LE Arterial Duplex (09/10/17); Doppler velocities suggest right fem-posterior tibial bypass with no stenosis. Calcified plaque and elevated velocities suggest 50-74% left CFA stenosis.  50-99% stenosis in the left popliteal artery (465 cm/s) where she had a hx of PTA, stent, although popliteal stent could not be  visualized, secondary to acoustic shadowing.   ABI (Date: 09/10/2017):  R:   ABI: 0.92 (was 0.81 on 04-18-17),   PT: bi  DP: bi  TBI:  0.47 (was 0.62)  L:   ABI: 0.68 (was 0.64),   PT: mono  DP: bi  TBI: 0.32 (was 0.18)   Medical Decision Making  Alona Keaunna Skipper is a 84 y.o. female who presents s/p abdominal aortogram with bilateral lower extremity runoff, drug coated balloon angioplasty of left superficial femoral artery, and stent placed in left superficial femoral artery on 07-08-17. Based on his angiographic findings, and after discussing with Dr. Scot Dock, this patient will be scheduled for bilateral LE arterial duplex, ABI's in 3 months, and see Dr. Trula Slade, not NP.  I discussed in depth with the patient the nature of atherosclerosis, and emphasized the importance of maximal medical management including strict control of blood pressure, blood glucose, and lipid levels, obtaining regular exercise, and cessation of smoking.  The patient is aware that without maximal medical management the underlying atherosclerotic disease process will progress, limiting the benefit of any interventions. The patient is currently on a statin.   The patient is currently on an anti-platelet.  Thank you for allowing [place] to participate in this patient's care.  NICKEL, Sharmon Leyden, RN, MSN, FNP-C [facility] Office: [phone]  09/10/2017, 2:51 PM  Clinic MD: Scot Dock

## 2017-09-11 NOTE — Addendum Note (Signed)
Addended by: Lianne Cure A on: 09/11/2017 04:12 PM   Modules accepted: Orders

## 2017-09-14 ENCOUNTER — Other Ambulatory Visit: Payer: Self-pay | Admitting: Surgery

## 2017-09-16 ENCOUNTER — Other Ambulatory Visit: Payer: Self-pay | Admitting: Vascular Surgery

## 2017-09-17 ENCOUNTER — Ambulatory Visit: Payer: Medicare Other | Admitting: Family Medicine

## 2017-09-18 ENCOUNTER — Encounter (HOSPITAL_COMMUNITY): Payer: Medicare Other

## 2017-09-18 ENCOUNTER — Ambulatory Visit: Payer: Medicare Other | Admitting: Family

## 2017-09-29 ENCOUNTER — Telehealth: Payer: Self-pay | Admitting: Internal Medicine

## 2017-09-29 NOTE — Telephone Encounter (Signed)
ATC daughter, no answer. Left message for pt to call back.

## 2017-09-29 NOTE — Telephone Encounter (Signed)
Patient daughter called back - she states ok to call her back tomorrow - she can be reached at [phone] -pr

## 2017-09-30 NOTE — Telephone Encounter (Signed)
Spoke with patient's daughter. She stated that her mother has a history of developing bronchitis or PNA during the Winter months. In years past, she will go to [place] and stay for the winter. When she gets there, she develops PNA or bronchitis and ends up in the hospital for several days. The hospital will fill her up with steroids and then send her home.   This year the daughter has decided to keep her in [place] to see how things will go. She wanted to know what MW would recommend in order to keep her out of the hospital. Advised her that since the patient has not been seen since April 2018, it would be best for her to come in for a visit.   Patient has been scheduled for 10/14/17 at 1145am.   Nothing else was needed at time of call.

## 2017-10-14 ENCOUNTER — Encounter: Payer: Self-pay | Admitting: Internal Medicine

## 2017-10-14 ENCOUNTER — Ambulatory Visit (INDEPENDENT_AMBULATORY_CARE_PROVIDER_SITE_OTHER): Payer: Medicare Other | Admitting: Internal Medicine

## 2017-10-14 VITALS — BP 140/70 | HR 65 | Ht 61.0 in | Wt 108.0 lb

## 2017-10-14 DIAGNOSIS — Z23 Encounter for immunization: Secondary | ICD-10-CM

## 2017-10-14 DIAGNOSIS — J449 Chronic obstructive pulmonary disease, unspecified: Secondary | ICD-10-CM | POA: Diagnosis not present

## 2017-10-14 DIAGNOSIS — I70213 Atherosclerosis of native arteries of extremities with intermittent claudication, bilateral legs: Secondary | ICD-10-CM | POA: Diagnosis not present

## 2017-10-14 MED ORDER — MOMETASONE FURO-FORMOTEROL FUM 100-5 MCG/ACT IN AERO: 2.0000 | INHALATION_SPRAY | Freq: Two times a day (BID) | RESPIRATORY_TRACT | 11 refills | Status: DC

## 2017-10-14 MED ORDER — MOMETASONE FURO-FORMOTEROL FUM 100-5 MCG/ACT IN AERO: 2.0000 | INHALATION_SPRAY | Freq: Two times a day (BID) | RESPIRATORY_TRACT | 0 refills | Status: DC

## 2017-10-14 MED ORDER — PREDNISONE 10 MG PO TABS: ORAL_TABLET | ORAL | 0 refills | Status: DC

## 2017-10-14 NOTE — Progress Notes (Signed)
Subjective:    Patient ID: Carla Dawson, female    DOB: [birth year],    MRN: [id number]     Brief patient profile:  47 yowf  Quit smoking 2007 with dx of copd in 2002 while living in [place] near [place] rx with Advair then Breo since quit smoking referred to pulmonary clinic 10/25/2016 by Dr   Betty Martinique   History of Present Illness  10/25/2016 1st [facility] office visit/ Carla Dawson   Chief Complaint  Patient presents with  . Pulmonary Consult    Referred by Dr. Betty Martinique.  Pt here to est care for COPD. She c/o SOB with walking approx 800 ft on a flat surface, eating and walking up an incline. She has some PND but not coughing much.    MMRC2 = can't walk a nl pace on a flat grade s sob but does fine slow and flat eg walking up to 800 ft - she measures the distances herself to keep tack as also limited by claudication - rarely feels need for saba  rec Work on inhaler technique:  relax and gently blow all the way out then take a nice smooth deep breath back in, triggering the inhaler at same time you start breathing in.  Hold for up to 5 seconds if you can. Blow out thru nose. Rinse and gargle with water when done Plan A = Automatic = Symbicort 160 Take 2 puffs first thing in am and then another 2 puffs about 12 hours later.  Plan B = Backup Only use your albuterol (proair) as a rescue medication Plan C = Crisis - only use your albuterol nebulizer if you first try Plan B and it fails to help > ok to use the nebulizer up to every 4 hours but if start needing it regularly call for immediate appointment   02/27/2017  f/u ov/Carla Dawson re:  Copd / 02 prn maint on advair 250 bid and prn saba  Chief Complaint  Patient presents with  . Follow-up    Pt states was hospitalized while in [place] early March 2018. She states she was told that she may have had the beginning of PNA. She states today her breathing is "great". She has occ non prod cough. She was sent home with o2, but has  not used in the past several days.   breathing worse suddenly while in [place] not responsive  To albuterol or 2lpm > admit x 4 days and back to baseline  MMRC2 = can't walk a nl pace on a flat grade s sob but does fine slow and flat  rec Plan A = Automatic = Breo one click each am > take two good deep drags off it but click it just once  Plan B = Backup Only use your albuterol (PROAIR) as a rescue medication Plan C = Crisis - only use your albuterol nebulizer if you first try Plan B         10/14/2017  f/u ov/Carla Dawson re:   GOLD III / maint on BREO daily / has 02 / not using  Chief Complaint  Patient presents with  . Follow-up    Pt plans to go to [place] soon for a month and wants to discuss plan to "keep from having to take prednisone".  She states she is using her proair rarely, but has been using her neb recently due to chest tightness.   doe =  MMRC3 = can't walk 100 yards even at a slow pace  at a flat grade s stopping due to sob  Can do HT but not sam's   Very hoarse on breo 100/ poor hfa  Sleeps ok   No obvious day to day or daytime variability or assoc excess/ purulent sputum or mucus plugs or hemoptysis or cp or chest tightness, subjective wheeze or overt sinus or hb symptoms. No unusual exp hx or h/o childhood pna/ asthma or knowledge of premature birth.  Sleeping ok flat without nocturnal  or early am exacerbation  of respiratory  c/o's or need for noct saba. Also denies any obvious fluctuation of symptoms with weather or environmental changes or other aggravating or alleviating factors except as outlined above   Current Allergies, Complete Past Medical History, Past Surgical History, Family History, and Social History were reviewed in [employer] record.  ROS  The following are not active complaints unless bolded Hoarseness, sore throat, dysphagia, dental problems, itching, sneezing,  nasal congestion or discharge of excess mucus or purulent secretions,  ear ache,   fever, chills, sweats, unintended wt loss or wt gain, classically pleuritic or exertional cp,  orthopnea pnd or leg swelling - intermittent not now presyncope, palpitations, abdominal pain, anorexia, nausea, vomiting, diarrhea  or change in bowel habits or change in bladder habits, change in stools or change in urine, dysuria, hematuria,  rash, arthralgias, visual complaints, headache, numbness, weakness or ataxia or problems with walking or coordination,  change in mood/affect or memory.        Current Meds  Medication Sig  . albuterol (PROVENTIL) (2.5 MG/3ML) 0.083% nebulizer solution Take 3 mLs (2.5 mg total) by nebulization every 4 (four) hours as needed for wheezing or shortness of breath.  Marland Kitchen amLODipine (NORVASC) 5 MG tablet Take 1 tablet (5 mg total) by mouth daily. (Patient taking differently: Take 5 mg by mouth at bedtime. )  . atorvastatin (LIPITOR) 10 MG tablet TAKE 1 TABLET BY MOUTH EVERY DAY  . cetirizine (ZYRTEC) 10 MG tablet Take 10 mg by mouth daily as needed for allergies.   Marland Kitchen clopidogrel (PLAVIX) 75 MG tablet TAKE 1 TABLET(75 MG) BY MOUTH DAILY (Patient taking differently: TAKE 1 TABLET(75 MG) BY MOUTH DAILY AT NIGHT)  . famotidine (PEPCID) 10 MG tablet Take 10 mg by mouth daily as needed for heartburn or indigestion.  . gabapentin (NEURONTIN) 300 MG capsule Take 1 capsule (300 mg total) by mouth at bedtime.  . Multiple Vitamin (MULTIVITAMIN WITH MINERALS) TABS tablet Take 1 tablet by mouth every evening.   Marland Kitchen PROAIR HFA 108 (90 Base) MCG/ACT inhaler INHALE 2 PUFFS BY MOUTH EVERY 4 HOURS FOR WHEEZING (Patient taking differently: INHALE 2 PUFFS BY MOUTH EVERY 4 HOURS AS NEEDED FOR WHEEZING)  . Probiotic Product (SOLUBLE FIBER/PROBIOTICS) CHEW Chew 1 capsule by mouth daily.  . [ fluticasone furoate-vilanterol (BREO ELLIPTA) 100-25 MCG/INH AEPB Inhale 1 puff into the lungs daily.                     Objective:   Physical Exam  Moderately hoarse amb wf  nad  10/14/2017        108  04/03/2017       100   02/27/2017      96  10/25/16 92 lb 9.6 oz (42 kg)  10/15/16 93 lb 9 oz (42.4 kg)  10/14/16 93 lb (42.2 kg)    Vital signs reviewed  - - Note on arrival 02 sats  95% on RA     HEENT: nl  dentition, turbinates bilaterally, and oropharynx. Nl external ear canals without cough reflex   NECK :  without JVD/Nodes/TM/ nl carotid upstrokes bilaterally   LUNGS: no acc muscle use,  slt barrel  contour chest  hyperresonant to percussion with distant bs bilaterally  CV:  RRR  no s3 or murmur or increase in P2, and no edema   ABD:  soft and nontender with nl inspiratory excursion in the supine position. No bruits or organomegaly appreciated, bowel sounds nl  MS:  Nl gait/ ext warm without deformities, calf tenderness, cyanosis or clubbing No obvious joint restrictions   SKIN: warm and dry without lesions    NEURO:  alert, approp, nl sensorium with  no motor or cerebellar deficits apparent.             Assessment & Plan:

## 2017-10-14 NOTE — Patient Instructions (Signed)
Plan A = Automatic = dulera 100 Take 2 puffs first thing in am and then another 2 puffs about 12 hours later.   Work on inhaler technique:  relax and gently blow all the way out then take a nice smooth deep breath back in, triggering the inhaler at same time you start breathing in.  Hold for up to 5 seconds if you can. Blow out thru nose. Rinse and gargle with water when done     Plan B = Backup Only use your albuterol (Proair)  as a rescue medication to be used if you can't catch your breath by resting or doing a relaxed purse lip breathing pattern.  - The less you use it, the better it will work when you need it. - Ok to use the inhaler up to 2 puffs  every 4 hours if you must but call for appointment if use goes up over your usual need - Don't leave home without it !!  (think of it like the spare tire for your car)   Plan C = Crisis - only use your albuterol nebulizer if you first try Plan B and it fails to help > ok to use the nebulizer up to every 4 hours but if start needing it regularly call for immediate appointment  Plan D = Deltasone take 6 days if you are having to use plan c and not working great  Plan E  = ER, go there if all elso fails    Please schedule a follow up visit in 3 months but call sooner if needed

## 2017-10-15 ENCOUNTER — Encounter: Payer: Self-pay | Admitting: Internal Medicine

## 2017-10-15 NOTE — Assessment & Plan Note (Addendum)
10/25/2016  try symbicort 160 2bid instead  of breo > preferred breo - 02/27/2017  After extensive coaching HFA effectiveness =    25% with hfa/ 75% with dpi so try back to BREO - PFT's  04/03/2017  FEV1 0.59 (37 % ) ratio 34  p 12  % improvement from saba p BREO  prior to study with DLCO  35/35 % corrects to 49  % for alv volume    - 10/14/2017  After extensive coaching HFA effectiveness =    75% from a baseline of 25% > try dulera 100 2bid due to hoarseness on breo  Doing well at present but despite good adherence/ use of dpi still having exacerbations sev times a year and wanted a contingency plan for while she's in [place] as says they always put her on high doses of prednisone when she winters there   Therefore rec try dulera 100 2bid as new maint and ABCDE plan reviewed which will include a plan D = for delatasone = 6 day course of pred for flare before plan E which is to go to ER and hopefully that will get her over the next flare s resorting to higher uses  The hfa should result in less hoarseness and lower risk of pna vs BREO 100 though if not able to master was instructed to go back to [facility]   I had an extended discussion with the patient reviewing all relevant studies completed to date and  lasting 15 to 20 minutes of a 25 minute visit    Each maintenance medication was reviewed in detail including most importantly the difference between maintenance and prns and under what circumstances the prns are to be triggered using an action plan format that is not reflected in the computer generated alphabetically organized AVS.    Please see AVS for specific instructions unique to this visit that I personally wrote and verbalized to the the pt in detail and then reviewed with pt  by my nurse highlighting any  changes in therapy recommended at today's visit to their plan of care.

## 2017-11-10 ENCOUNTER — Telehealth: Payer: Self-pay | Admitting: Internal Medicine

## 2017-11-10 NOTE — Telephone Encounter (Signed)
Spoke to [place] with [facility] and provided dx code for albuterol neb solution. Nothing further needed.

## 2017-12-15 ENCOUNTER — Ambulatory Visit (INDEPENDENT_AMBULATORY_CARE_PROVIDER_SITE_OTHER): Payer: Medicare Other | Admitting: Surgery

## 2017-12-15 ENCOUNTER — Ambulatory Visit (INDEPENDENT_AMBULATORY_CARE_PROVIDER_SITE_OTHER)
Admission: RE | Admit: 2017-12-15 | Discharge: 2017-12-15 | Disposition: A | Discharge status: Home / Self Care | Payer: Medicare Other | Source: Ambulatory Visit | Attending: Vascular Surgery | Admitting: Vascular Surgery

## 2017-12-15 ENCOUNTER — Encounter: Payer: Self-pay | Admitting: Surgery

## 2017-12-15 ENCOUNTER — Ambulatory Visit (HOSPITAL_COMMUNITY)
Admission: RE | Admit: 2017-12-15 | Discharge: 2017-12-15 | Disposition: A | Discharge status: Home / Self Care | Payer: Medicare Other | Source: Ambulatory Visit | Attending: Vascular Surgery | Admitting: Vascular Surgery

## 2017-12-15 VITALS — BP 149/72 | HR 73 | Temp 97.1°F | Resp 16 | Ht 61.0 in | Wt 108.0 lb

## 2017-12-15 DIAGNOSIS — I70213 Atherosclerosis of native arteries of extremities with intermittent claudication, bilateral legs: Secondary | ICD-10-CM

## 2017-12-15 DIAGNOSIS — Z9862 Peripheral vascular angioplasty status: Secondary | ICD-10-CM | POA: Diagnosis not present

## 2017-12-15 DIAGNOSIS — Z87891 Personal history of nicotine dependence: Secondary | ICD-10-CM

## 2017-12-15 NOTE — Progress Notes (Signed)
[facility]  Patient name: Carla Dawson MRN: [id number] DOB: 30-Dec-1930 Sex: female   REASON FOR VISIT:    Follow up  [place]:    This is an 84 year old female whom I met in 2017 for peripheral vascular disease.  Previously, [place], she was dealing with bilateral rest pain.  She underwent percutaneous revascularization of her right leg which was complicated by acute occlusion as well as perforation which required cover stenting.  This ultimately led to surgical revascularization which appears to be a right femoral to tibioperoneal trunk bypass graft.  On the left leg, the patient has undergone multiple percutaneous interventions including atherectomy and angioplasty of the superficial femoral and popliteal artery as well as angioplasty of anterior tibial artery.  In March 2018, ultrasound identified several elevated velocities within her left leg and therefore she underwent angiography where she had angioplasty of her left superficial femoral, popliteal, tibioperoneal trunk and posterior tibial artery.  In July 2018, she had repeat angiography for recurrent symptoms.  In-stent stenosis within the left superficial femoral artery was identified and treated with a 4 mm drug-coated balloon.  She is back today for follow-up.  She does not endorse any complaints in her leg.  She has had one episode of cramping.  She is really trying to improve her activity level.  She walked approximately 1000 steps without difficulty.  She does not have any open wounds.  PAST MEDICAL HISTORY:   Past Medical History:  Diagnosis Date  . Allergy    "maybe seasonal allergies" (07/08/2017)  . Arthritis    "hands" (07/08/2017)  . Chicken pox   . COPD (chronic obstructive pulmonary disease) ([place])   . High cholesterol    "took me off pills ~ 1-2 months ago" (07/08/2017)  . History of blood transfusion 2017   "when I had blood clot in my leg"  . Hypertension   . PAD (peripheral artery disease) ([place])    severe/notes 10/14/2016  . Pneumonia ~ 2015  . PVD (peripheral vascular disease) ([place])      FAMILY HISTORY:   Family History  Problem Relation Age of Onset  . Arthritis Mother   . Hypertension Mother   . Arthritis Father   . Hypertension Father   . Cancer Brother   . Hypertension Son     SOCIAL HISTORY:   Social History   Tobacco Use  . Smoking status: Former Smoker    Packs/day: 1.50    Years: 52.00    Pack years: 78.00    Start date: 12/09/1953    Last attempt to quit: 12/09/2005    Years since quitting: 12.0  . Smokeless tobacco: Never Used  Substance Use Topics  . Alcohol use: No     ALLERGIES:   Allergies  Allergen Reactions  . Lortab [Hydrocodone-Acetaminophen] Shortness Of Breath and Itching    turns real red, took benadryl      CURRENT MEDICATIONS:   Current Outpatient Medications  Medication Sig Dispense Refill  . albuterol (PROVENTIL) (2.5 MG/3ML) 0.083% nebulizer solution Take 3 mLs (2.5 mg total) by nebulization every 4 (four) hours as needed for wheezing or shortness of breath. 75 mL 12  . amLODipine (NORVASC) 5 MG tablet Take 1 tablet (5 mg total) by mouth daily. (Patient taking differently: Take 5 mg by mouth at bedtime. ) 90 tablet 2  . atorvastatin (LIPITOR) 10 MG tablet TAKE 1 TABLET BY MOUTH EVERY DAY 90 tablet 0  .  cetirizine (ZYRTEC) 10 MG tablet Take 10 mg by mouth daily as needed for allergies.     Marland Kitchen clopidogrel (PLAVIX) 75 MG tablet TAKE 1 TABLET(75 MG) BY MOUTH DAILY (Patient taking differently: TAKE 1 TABLET(75 MG) BY MOUTH DAILY AT NIGHT) 90 tablet 3  . famotidine (PEPCID) 10 MG tablet Take 10 mg by mouth daily as needed for heartburn or indigestion.    . gabapentin (NEURONTIN) 300 MG capsule Take 1 capsule (300 mg total) by mouth at bedtime. 30 capsule 2  . mometasone-formoterol (DULERA) 100-5 MCG/ACT AERO Inhale 2 puffs 2 (two) times daily  into the lungs. 1 Inhaler 11  . Multiple Vitamin (MULTIVITAMIN WITH MINERALS) TABS tablet Take 1 tablet by mouth every evening.     . predniSONE (DELTASONE) 10 MG tablet Take  4 each am x 2 days,   2 each am x 2 days,  1 each am x 2 days and stop 14 tablet 0  . PROAIR HFA 108 (90 Base) MCG/ACT inhaler INHALE 2 PUFFS BY MOUTH EVERY 4 HOURS FOR WHEEZING (Patient taking differently: INHALE 2 PUFFS BY MOUTH EVERY 4 HOURS AS NEEDED FOR WHEEZING) 8.5 g 1  . Probiotic Product (SOLUBLE FIBER/PROBIOTICS) CHEW Chew 1 capsule by mouth daily.     No current facility-administered medications for this visit.     REVIEW OF SYSTEMS:   [X]  denotes positive finding, [ ]  denotes negative finding Cardiac  Comments:  Chest pain or chest pressure:    Shortness of breath upon exertion:    Short of breath when lying flat:    Irregular heart rhythm:        Vascular    Pain in calf, thigh, or hip brought on by ambulation:    Pain in feet at night that wakes you up from your sleep:     Blood clot in your veins:    Leg swelling:         Pulmonary    Oxygen at home:    Productive cough:     Wheezing:         Neurologic    Sudden weakness in arms or legs:     Sudden numbness in arms or legs:     Sudden onset of difficulty speaking or slurred speech:    Temporary loss of vision in one eye:     Problems with dizziness:         Gastrointestinal    Blood in stool:     Vomited blood:         Genitourinary    Burning when urinating:     Blood in urine:        Psychiatric    Major depression:         Hematologic    Bleeding problems:    Problems with blood clotting too easily:        Skin    Rashes or ulcers:        Constitutional    Fever or chills:      PHYSICAL EXAM:   Vitals:   12/15/17 1155  BP: (!) 149/72  Pulse: 73  Resp: 16  Temp: (!) 97.1 F (36.2 C)  TempSrc: Oral  SpO2: 95%  Weight: 108 lb (49 kg)  Height: 5' 1"  (1.549 m)    GENERAL: The patient is a well-nourished  female, in no acute distress. The vital signs are documented above. CARDIAC: There is a regular rate and rhythm.  VASCULAR: Pedal pulses are nonpalpable PULMONARY: Non-labored respirations  MUSCULOSKELETAL: There are no major deformities or cyanosis. NEUROLOGIC: No focal weakness or paresthesias are detected. SKIN: There are no ulcers or rashes noted. PSYCHIATRIC: The patient has a normal affect.  STUDIES:   I have reviewed her vascular lab studies today. ABI: Right = 0.88 (0.92).  Left = 0.63 (0.68)  Duplex: Elevated velocities within the left popliteal artery, 50-99% Elevated velocities 50-74% in bilateral common femoral artery  MEDICAL ISSUES:   Currently, the patient is asymptomatic regarding her atherosclerotic lower extremity vascular disease.  Most likely, because of the small size of her arteries, she has a very poor durability of percutaneous interventions.  I discussed with the patient that should she develop recurrent symptoms, the best option would likely be a below-knee popliteal bypass graft.  I think repeating percutaneous intervention has very low yield at this point.  Currently, without her having any symptoms, I will continue to monitor her.  She will come back in 6 months, or call sooner if she develops symptoms.    [name], MD [facility] [phone] Pager [phone]

## 2017-12-16 NOTE — Addendum Note (Signed)
Addended by: Lianne Cure A on: 12/16/2017 09:30 AM   Modules accepted: Orders

## 2018-01-12 ENCOUNTER — Other Ambulatory Visit: Payer: Self-pay | Admitting: Vascular Surgery

## 2018-01-12 ENCOUNTER — Telehealth: Payer: Self-pay | Admitting: Internal Medicine

## 2018-01-12 DIAGNOSIS — J449 Chronic obstructive pulmonary disease, unspecified: Secondary | ICD-10-CM

## 2018-01-12 NOTE — Telephone Encounter (Signed)
That's fine but  Be sure to keep f/u appt to be sure she's still doing well

## 2018-01-12 NOTE — Telephone Encounter (Signed)
Spoke with pt's daughter, [name]. States that pt is no longer using oxygen and would like to have everything picked up.  MW - please advise if we can send an order to [facility]. Thanks.

## 2018-01-12 NOTE — Telephone Encounter (Signed)
Spoke with EC. She is aware of MW's recs. Will send order to [facility]. Nothing else needed at time of call.

## 2018-01-14 ENCOUNTER — Ambulatory Visit: Payer: Medicare Other | Admitting: Internal Medicine

## 2018-02-04 ENCOUNTER — Encounter: Payer: Self-pay | Admitting: Internal Medicine

## 2018-02-04 ENCOUNTER — Ambulatory Visit (INDEPENDENT_AMBULATORY_CARE_PROVIDER_SITE_OTHER): Payer: Medicare Other | Admitting: Internal Medicine

## 2018-02-04 ENCOUNTER — Ambulatory Visit (INDEPENDENT_AMBULATORY_CARE_PROVIDER_SITE_OTHER)
Admission: RE | Admit: 2018-02-04 | Discharge: 2018-02-04 | Disposition: A | Discharge status: Home / Self Care | Payer: Medicare Other | Source: Ambulatory Visit | Attending: Internal Medicine | Admitting: Internal Medicine

## 2018-02-04 VITALS — BP 126/74 | HR 87 | Ht 61.0 in | Wt 109.6 lb

## 2018-02-04 DIAGNOSIS — I70213 Atherosclerosis of native arteries of extremities with intermittent claudication, bilateral legs: Secondary | ICD-10-CM | POA: Diagnosis not present

## 2018-02-04 DIAGNOSIS — J449 Chronic obstructive pulmonary disease, unspecified: Secondary | ICD-10-CM | POA: Diagnosis not present

## 2018-02-04 DIAGNOSIS — R0609 Other forms of dyspnea: Secondary | ICD-10-CM | POA: Diagnosis not present

## 2018-02-04 DIAGNOSIS — R9389 Abnormal findings on diagnostic imaging of other specified body structures: Secondary | ICD-10-CM

## 2018-02-04 MED ORDER — FLUTICASONE-UMECLIDIN-VILANT 100-62.5-25 MCG/INH IN AEPB: 1.0000 | INHALATION_SPRAY | Freq: Every day | RESPIRATORY_TRACT | 11 refills | Status: DC

## 2018-02-04 NOTE — Patient Instructions (Addendum)
Plan A = Automatic = Breo or Trelegy daily    Work on inhaler technique:  relax and gently blow all the way out then take a nice smooth deep breath back in, triggering the inhaler at same time you start breathing in.  Hold for up to 5 seconds if you can. Blow out thru nose. Rinse and gargle with water when done     Plan B = Backup Only use your albuterol (Proair)  as a rescue medication to be used if you can't catch your breath by resting or doing a relaxed purse lip breathing pattern.  - The less you use it, the better it will work when you need it. - Ok to use the inhaler up to 2 puffs  every 4 hours if you must but call for appointment if use goes up over your usual need - Don't leave home without it !!  (think of it like the spare tire for your car)   Plan C = Crisis - only use your albuterol nebulizer if you first try Plan B and it fails to help > ok to use the nebulizer up to every 4 hours but if start needing it regularly call for immediate appointment  Plan D = Deltasone take 6 days if you are having to use plan c and not working great  Plan E  = ER, go there if all elso fails    Please remember to go to the  x-ray department downstairs in the basement  for your tests - we will call you with the results when they are available.      Please schedule a follow up visit in 6  months but call sooner if needed

## 2018-02-04 NOTE — Progress Notes (Signed)
Subjective:    Patient ID: Carla Dawson, female    DOB: Nov 19, 1931,    MRN: [id number]     Brief patient profile:  17 yowf  Quit smoking 2007 with dx of copd in 2002 while living in [place] near [place] rx with Advair then Breo since quit smoking referred to pulmonary clinic 10/25/2016 by Dr   Betty Martinique   History of Present Illness  10/25/2016 1st [facility] office visit/ Dayan Kreis   Chief Complaint  Patient presents with  . Pulmonary Consult    Referred by Dr. Betty Martinique.  Pt here to est care for COPD. She c/o SOB with walking approx 800 ft on a flat surface, eating and walking up an incline. She has some PND but not coughing much.    MMRC2 = can't walk a nl pace on a flat grade s sob but does fine slow and flat eg walking up to 800 ft - she measures the distances herself to keep tack as also limited by claudication - rarely feels need for saba  rec Work on inhaler technique:  relax and gently blow all the way out then take a nice smooth deep breath back in, triggering the inhaler at same time you start breathing in.  Hold for up to 5 seconds if you can. Blow out thru nose. Rinse and gargle with water when done Plan A = Automatic = Symbicort 160 Take 2 puffs first thing in am and then another 2 puffs about 12 hours later.  Plan B = Backup Only use your albuterol (proair) as a rescue medication Plan C = Crisis - only use your albuterol nebulizer if you first try Plan B and it fails to help > ok to use the nebulizer up to every 4 hours but if start needing it regularly call for immediate appointment   02/27/2017  f/u ov/Annia Gomm re:  Copd / 02 prn maint on advair 250 bid and prn saba  Chief Complaint  Patient presents with  . Follow-up    Pt states was hospitalized while in [place] early March 2018. She states she was told that she may have had the beginning of PNA. She states today her breathing is "great". She has occ non prod cough. She was sent home with o2, but has  not used in the past several days.   breathing worse suddenly while in [place] not responsive  To albuterol or 2lpm > admit x 4 days and back to baseline  MMRC2 = can't walk a nl pace on a flat grade s sob but does fine slow and flat  rec Plan A = Automatic = Breo one click each am > take two good deep drags off it but click it just once  Plan B = Backup Only use your albuterol (PROAIR) as a rescue medication Plan C = Crisis - only use your albuterol nebulizer if you first try Plan B         10/14/2017  f/u ov/Garry Bochicchio re:   GOLD III / maint on BREO daily / has 02 / not using  Chief Complaint  Patient presents with  . Follow-up    Pt plans to go to [place] soon for a month and wants to discuss plan to "keep from having to take prednisone".  She states she is using her proair rarely, but has been using her neb recently due to chest tightness.   doe =  MMRC3 = can't walk 100 yards even at a slow pace  at a flat grade s stopping due to sob  Can do HT but not sam's   Very hoarse on breo 100/ poor hfa  Sleeps ok  rec Plan A = Automatic = dulera 100 Take 2 puffs first thing in am and then another 2 puffs about 12 hours later.  Work on inhaler technique: Plan B = Backup Only use your albuterol [employer])  as a rescue medication  Plan C = Crisis - only use your albuterol nebulizer if you first try Plan B and it fails to help > ok to use the nebulizer up to every 4 hours but if start needing it regularly call for immediate appointment Plan D = Deltasone take 6 days if you are having to use plan c and not working great Plan E  = ER, go there if all elso fails      02/04/2018  f/u ov/Sidrah Harden re:  COPDIII /one flare in Dec 2018 / BREO maint but req one course pred in last 3 m for aecopd  Chief Complaint  Patient presents with  . Follow-up    Last night she was eating and aspirated hamburger. She states that she feels pain in her throat when she takes a deep breath.  Her breathing is unchanged. She is  using her proair 3-4 x per wk on average and rarely uses neb.    Dyspnea:  MMRC3 = can't walk 100 yards even at a slow pace at a flat grade s stopping due to sob   Cough: not normally/  Sleep: ok/ no 02  SABA use:   No more than once a day/ maybe p shower    No obvious day to day or daytime variability or assoc excess/ purulent sputum or mucus plugs or hemoptysis or cp or chest tightness, subjective wheeze or overt sinus or hb symptoms. No unusual exposure hx or h/o childhood pna/ asthma or knowledge of premature birth.  Sleeping ok flat without nocturnal  or early am exacerbation  of respiratory  c/o's or need for noct saba. Also denies any obvious fluctuation of symptoms with weather or environmental changes or other aggravating or alleviating factors except as outlined above   Current Allergies, Complete Past Medical History, Past Surgical History, Family History, and Social History were reviewed in [employer] record.  ROS  The following are not active complaints unless bolded Hoarseness, sore throat, dysphagia, dental problems, itching, sneezing,  nasal congestion or discharge of excess mucus or purulent secretions, ear ache,   fever, chills, sweats, unintended wt loss or wt gain, classically pleuritic or exertional cp,  orthopnea pnd or leg swelling, presyncope, palpitations, abdominal pain, anorexia, nausea, vomiting, diarrhea  or change in bowel habits or change in bladder habits, change in stools or change in urine, dysuria, hematuria,  rash, arthralgias, visual complaints, headache, numbness, weakness or ataxia or problems with walking or coordination,  change in mood/affect or memory.        Current Meds  Medication Sig  . albuterol (PROVENTIL) (2.5 MG/3ML) 0.083% nebulizer solution Take 3 mLs (2.5 mg total) by nebulization every 4 (four) hours as needed for wheezing or shortness of breath.  Marland Kitchen amLODipine (NORVASC) 5 MG tablet Take 1 tablet (5 mg total) by mouth  daily. (Patient taking differently: Take 5 mg by mouth at bedtime. )  . atorvastatin (LIPITOR) 10 MG tablet TAKE 1 TABLET BY MOUTH EVERY DAY  . BREO ELLIPTA 100-25 MCG/INH AEPB Inhale 1 puff into the lungs daily.  Marland Kitchen  cetirizine (ZYRTEC) 10 MG tablet Take 10 mg by mouth daily as needed for allergies.   Marland Kitchen clopidogrel (PLAVIX) 75 MG tablet TAKE 1 TABLET(75 MG) BY MOUTH DAILY (Patient taking differently: TAKE 1 TABLET(75 MG) BY MOUTH DAILY AT NIGHT)  . famotidine (PEPCID) 10 MG tablet Take 10 mg by mouth daily as needed for heartburn or indigestion.  . gabapentin (NEURONTIN) 300 MG capsule Take 1 capsule (300 mg total) by mouth at bedtime. (Patient taking differently: Take 300 mg by mouth at bedtime as needed. )  . Multiple Vitamin (MULTIVITAMIN WITH MINERALS) TABS tablet Take 1 tablet by mouth every evening.   Marland Kitchen PROAIR HFA 108 (90 Base) MCG/ACT inhaler INHALE 2 PUFFS BY MOUTH EVERY 4 HOURS FOR WHEEZING (Patient taking differently: INHALE 2 PUFFS BY MOUTH EVERY 4 HOURS AS NEEDED FOR WHEEZING)  . Probiotic Product (SOLUBLE FIBER/PROBIOTICS) CHEW Chew 1 capsule by mouth daily.                         Objective:   Physical Exam  amb wf nad  02/04/2018       109  10/14/2017       108  04/03/2017       100   02/27/2017      96  10/25/16 92 lb 9.6 oz (42 kg)  10/15/16 93 lb 9 oz (42.4 kg)  10/14/16 93 lb (42.2 kg)    Vital signs reviewed - Note on arrival 02 sats  95% on RA         dentures LUNGS: no acc muscle use,  slt barrel  contour chest  hyperresonant to percussion with distant bs bilaterally   HEENT: nl   turbinates bilaterally, and oropharynx. Nl external ear canals without cough reflex -  Full dentures    NECK :  without JVD/Nodes/TM/ nl carotid upstrokes bilaterally   LUNGS: no acc muscle use,  barrel contour chest wall with bilateral  disant bs  without cough on insp or exp maneuver and hyperresonant   to  percussion bilaterally     CV:  RRR  no s3 or murmur or  increase in P2, and no edema   ABD:  soft and nontender with nl inspiratory excursion in the supine position. No bruits or organomegaly appreciated, bowel sounds nl  MS:  Nl gait/ ext warm without deformities, calf tenderness, cyanosis or clubbing No obvious joint restrictions   SKIN: warm and dry without lesions    NEURO:  alert, approp, nl sensorium with  no motor or cerebellar deficits apparent.        CXR PA and Lateral:   02/04/2018 :    I personally reviewed images and agree with radiology impression as follows:   No acute abnormality.  Stable changes of COPD.    Assessment & Plan:

## 2018-02-05 ENCOUNTER — Encounter: Payer: Self-pay | Admitting: Internal Medicine

## 2018-02-05 NOTE — Assessment & Plan Note (Signed)
10/25/2016  try symbicort 160 2bid instead  of breo > preferred breo - 02/27/2017  After extensive coaching HFA effectiveness =    25% with hfa/ 75% with dpi so try back to BREO - PFT's  04/03/2017  FEV1 0.59 (37 % ) ratio 34  p 12  % improvement from saba p BREO  prior to study with DLCO  35/35 % corrects to 49  % for alv volume   - 10/14/2017    > try dulera 100 2bid due to hoarseness on breo   - 02/04/2018 trial of trelegy    Group D in terms of symptom/risk and laba/lama/ICS  therefore appropriate rx at this point.   - The proper method of use, as well as anticipated side effects, of a elipta  inhaler are discussed and demonstrated to the patient.   Each maintenance medication was reviewed in detail including most importantly the difference between maintenance and as needed and under what circumstances the prns are to be used.  Please see AVS for specific  Instructions which are unique to this visit and I personally typed out  which were reviewed in detail in writing with the patient and a copy provided.

## 2018-02-05 NOTE — Progress Notes (Signed)
Spoke with pt's daughter and notified of results per Dr. Wert. She verbalized understanding and denied any questions. 

## 2018-02-05 NOTE — Assessment & Plan Note (Signed)
See cxr 10/25/2016 R apical streaky densities with no comparisons available  - repeat  02/27/17 :  R apical lesion c/w granuloma > no change 02/04/2018 > no directed f/u needed at this point

## 2018-02-17 ENCOUNTER — Telehealth: Payer: Self-pay | Admitting: Internal Medicine

## 2018-02-17 NOTE — Telephone Encounter (Addendum)
Trelegy samples will be left up front. LM to advise [name] to pick them up.

## 2018-02-19 ENCOUNTER — Other Ambulatory Visit: Payer: Self-pay | Admitting: Family Medicine

## 2018-02-23 NOTE — Progress Notes (Addendum)
HPI:   [name] is a 84 y.o. female, who is here today with her daughter follow up.   She was last seen in 06/2017  Since her last OV she has Dr. Trula Slade, vascular surgeon.  She also follows with Dr. Melvyn Novas, pulmonologist.  According to patient and daughter, she is supposed to follow with Dr. Trula Slade if she starts having lower extremity cramps. Currently she is on Plavix 75 mg daily. She has been on Plavix for about 5 years.  She is tolerating medication well, denies side effects or abnormal bleeding.  She has increased physical activity, walking longer distances, not having claudication-like symptoms lately.  Neuropathic pain has improved,numb like pain, mainly at night. She takes Gabapentin 300 mg at bedtime prn.   Hypertension: Currently she is on Amlodipine 5 mg daily.  Lab Results  Component Value Date   CREATININE 0.67 07/09/2017   BUN 10 07/09/2017   NA 136 07/09/2017   K 3.9 07/09/2017   CL 105 07/09/2017   CO2 28 07/09/2017   Denies frequent headache, visual changes, chest pain, dyspnea, palpitation, focal weakness, or edema. She follows with Dr Earlean Shawl.  HLD:  She is currently on Atorvastatin 10 mg daily. She is tolerating medication well, denies side effects. She also follows low fat diet.  Lab Results  Component Value Date   CHOL 198 04/23/2013   HDL 62 04/23/2013   LDLCALC 115 04/23/2013   TRIG 105 04/23/2013     Review of Systems  Constitutional: Negative for activity change, appetite change, fatigue and fever.  HENT: Negative for mouth sores, nosebleeds and trouble swallowing.   Eyes: Negative for redness and visual disturbance.  Respiratory: Negative for cough, shortness of breath and wheezing.   Cardiovascular: Negative for chest pain, palpitations and leg swelling.  Gastrointestinal: Negative for abdominal pain, blood in stool, nausea and vomiting.       Negative for changes in bowel habits.    Endocrine: Negative for cold intolerance and heat intolerance.  Genitourinary: Negative for decreased urine volume, dysuria and hematuria.  Skin: Negative for rash and wound.  Neurological: Negative for syncope, weakness and headaches.  Hematological: Does not bruise/bleed easily.     Current Outpatient Medications on File Prior to Visit  Medication Sig Dispense Refill  . albuterol (PROVENTIL) (2.5 MG/3ML) 0.083% nebulizer solution Take 3 mLs (2.5 mg total) by nebulization every 4 (four) hours as needed for wheezing or shortness of breath. 75 mL 12  . BREO ELLIPTA 100-25 MCG/INH AEPB Inhale 1 puff into the lungs daily.  5  . cetirizine (ZYRTEC) 10 MG tablet Take 10 mg by mouth daily as needed for allergies.     . famotidine (PEPCID) 10 MG tablet Take 10 mg by mouth daily as needed for heartburn or indigestion.    . Fluticasone-Umeclidin-Vilant (TRELEGY ELLIPTA) 100-62.5-25 MCG/INH AEPB Inhale 1 puff into the lungs daily. 1 each 11  . gabapentin (NEURONTIN) 300 MG capsule Take 1 capsule (300 mg total) by mouth at bedtime. (Patient taking differently: Take 300 mg by mouth at bedtime as needed. ) 30 capsule 2  . Multiple Vitamin (MULTIVITAMIN WITH MINERALS) TABS tablet Take 1 tablet by mouth every evening.     Marland Kitchen PROAIR HFA 108 (90 Base) MCG/ACT inhaler INHALE 2 PUFFS BY MOUTH EVERY 4 HOURS FOR WHEEZING (Patient taking differently: INHALE 2 PUFFS BY MOUTH EVERY 4 HOURS AS NEEDED FOR WHEEZING) 8.5 g 1  . Probiotic Product (SOLUBLE FIBER/PROBIOTICS) CHEW Chew 1 capsule  by mouth daily.     No current facility-administered medications on file prior to visit.      Past Medical History:  Diagnosis Date  . Allergy    "maybe seasonal allergies" (07/08/2017)  . Arthritis    "hands" (07/08/2017)  . Chicken pox   . COPD (chronic obstructive pulmonary disease) ([facility])   . High cholesterol    "took me off pills ~ 1-2 months ago" (07/08/2017)  . History of blood transfusion 2017   "when I had blood  clot in my leg"  . Hypertension   . PAD (peripheral artery disease) ([facility])    severe/notes 10/14/2016  . Pneumonia ~ 2015  . PVD (peripheral vascular disease) ([facility])    Allergies  Allergen Reactions  . Lortab [Hydrocodone-Acetaminophen] Shortness Of Breath and Itching    turns real red, took benadryl     Social History   Socioeconomic History  . Marital status: Divorced    Spouse name: Not on file  . Number of children: Not on file  . Years of education: Not on file  . Highest education level: Not on file  Occupational History  . Not on file  Social Needs  . Financial resource strain: Not on file  . Food insecurity:    Worry: Not on file    Inability: Not on file  . Transportation needs:    Medical: Not on file    Non-medical: Not on file  Tobacco Use  . Smoking status: Former Smoker    Packs/day: 1.50    Years: 52.00    Pack years: 78.00    Start date: 12/09/1953    Last attempt to quit: 12/09/2005    Years since quitting: 12.2  . Smokeless tobacco: Never Used  Substance and Sexual Activity  . Alcohol use: No  . Drug use: No  . Sexual activity: Not Currently  Lifestyle  . Physical activity:    Days per week: Not on file    Minutes per session: Not on file  . Stress: Not on file  Relationships  . Social connections:    Talks on phone: Not on file    Gets together: Not on file    Attends religious service: Not on file    Active member of club or organization: Not on file    Attends meetings of clubs or organizations: Not on file    Relationship status: Not on file  Other Topics Concern  . Not on file  Social History Narrative  . Not on file    Vitals:   02/24/18 0934  BP: 130/70  Pulse: 81  Resp: 12  Temp: 98 F (36.7 C)  SpO2: 94%   Body mass index is 20.86 kg/m.   Physical Exam  Nursing note and vitals reviewed. Constitutional: She is oriented to person, place, and time. She appears well-developed and well-nourished. No distress.  HENT:  Head:  Normocephalic and atraumatic.  Mouth/Throat: Oropharynx is clear and moist and mucous membranes are normal. She has dentures.  Eyes: Conjunctivae are normal. Pupils are equal, round, and reactive to light.  Cardiovascular: Normal rate and regular rhythm.  No murmur heard. Normal capillary refill.  Respiratory: Effort normal and breath sounds normal. No respiratory distress.  GI: Soft. She exhibits no mass. There is no hepatomegaly. There is no tenderness.  Musculoskeletal: She exhibits no edema or tenderness.  Lymphadenopathy:    She has no cervical adenopathy.  Neurological: She is alert and oriented to person, place, and time. She has  normal strength. Coordination normal.  Stable gait with no assistance.  Skin: Skin is warm. No rash noted. No erythema.  Thick scaling areas on feet, worse on plantar areas.  No wound appreciated. Hypertrophic long toenails, worse in right foot.  Psychiatric: She has a normal mood and affect.  Well groomed, good eye contact.       ASSESSMENT AND PLAN:   Ms. Mashonda Broski was seen today for follow-up.  Orders Placed This Encounter  Procedures  . Comprehensive metabolic panel  . Lipid panel   Lab Results  Component Value Date   ALT 11 02/27/2018   AST 24 02/27/2018   ALKPHOS 89 02/27/2018   BILITOT 0.5 02/27/2018   Lab Results  Component Value Date   CREATININE 0.75 02/27/2018   BUN 13 02/27/2018   NA 142 02/27/2018   K 4.1 02/27/2018   CL 105 02/27/2018   CO2 31 02/27/2018   Lab Results  Component Value Date   CHOL 148 02/27/2018   HDL 59.80 02/27/2018   LDLCALC 73 02/27/2018   TRIG 77.0 02/27/2018   CHOLHDL 2 02/27/2018    Neuropathy due to peripheral vascular disease ([place]) Improved. She will continue Gabapentin at night as needed. We discussed the importance of good lower extremity skin care, avoiding injuries and checking feet a few times per week.   Essential hypertension, benign Adequately controlled. No  changes in current management. Continue low-salt diet. Eye exam recommended annually. She would like to continue following annually, which I think is appropriate as far as she monitor BP at home.   Hyperlipidemia For now no changes in atorvastatin 10 mg. She will be back for fasting labs in 3 days and further recommendations will be given accordingly. Ideally LDL goal given her history of PAD < 70. Also continue low-fat diet. Follow-up in 12 months.  PAD (peripheral artery disease) ([facility]) Symptoms have improved. Continue Plavix and Atorvastatin. Instructed about warning signs. She will continue following with Dr. Trula Slade annually, before if she has any problem.     -Ms. [name] was advised to return sooner than planned today if new concerns arise.       Shamira Toutant G. Martinique, MD  [facility]. [place] office.

## 2018-02-24 ENCOUNTER — Encounter: Payer: Self-pay | Admitting: Family Medicine

## 2018-02-24 ENCOUNTER — Ambulatory Visit (INDEPENDENT_AMBULATORY_CARE_PROVIDER_SITE_OTHER): Payer: Medicare Other | Admitting: Family Medicine

## 2018-02-24 VITALS — BP 130/70 | HR 81 | Temp 98.0°F | Resp 12 | Ht 61.0 in | Wt 110.4 lb

## 2018-02-24 DIAGNOSIS — E785 Hyperlipidemia, unspecified: Secondary | ICD-10-CM | POA: Diagnosis not present

## 2018-02-24 DIAGNOSIS — G63 Polyneuropathy in diseases classified elsewhere: Secondary | ICD-10-CM | POA: Diagnosis not present

## 2018-02-24 DIAGNOSIS — I10 Essential (primary) hypertension: Secondary | ICD-10-CM

## 2018-02-24 DIAGNOSIS — I70213 Atherosclerosis of native arteries of extremities with intermittent claudication, bilateral legs: Secondary | ICD-10-CM | POA: Diagnosis not present

## 2018-02-24 DIAGNOSIS — I739 Peripheral vascular disease, unspecified: Secondary | ICD-10-CM | POA: Diagnosis not present

## 2018-02-24 MED ORDER — AMLODIPINE BESYLATE 5 MG PO TABS: 5.0000 mg | ORAL_TABLET | Freq: Every day | ORAL | 3 refills | Status: DC

## 2018-02-24 MED ORDER — ATORVASTATIN CALCIUM 10 MG PO TABS
10.0000 mg | ORAL_TABLET | Freq: Every day | ORAL | 3 refills | Status: DC
Start: 2018-02-24 — End: 2018-04-10

## 2018-02-24 MED ORDER — CLOPIDOGREL BISULFATE 75 MG PO TABS: ORAL_TABLET | ORAL | 3 refills | Status: DC

## 2018-02-24 NOTE — Assessment & Plan Note (Signed)
Symptoms have improved. Continue Plavix and Atorvastatin. Instructed about warning signs. She will continue following with Dr. Trula Slade annually, before if she has any problem.

## 2018-02-24 NOTE — Patient Instructions (Signed)
A few things to remember from today's visit:   PAD (peripheral artery disease) ([place]) - Plan: clopidogrel (PLAVIX) 75 MG tablet  Essential hypertension, benign - Plan: amLODipine (NORVASC) 5 MG tablet  No changes and labs this Friday.    Please be sure medication list is accurate. If a new problem present, please set up appointment sooner than planned today.

## 2018-02-24 NOTE — Assessment & Plan Note (Signed)
Adequately controlled. No changes in current management. Continue low-salt diet. Eye exam recommended annually. She would like to continue following annually, which I think is appropriate as far as she monitor BP at home.

## 2018-02-24 NOTE — Assessment & Plan Note (Addendum)
For now no changes in atorvastatin 10 mg. She will be back for fasting labs in 3 days and further recommendations will be given accordingly. Ideally LDL goal given her history of PAD < 70. Also continue low-fat diet. Follow-up in 12 months.

## 2018-02-24 NOTE — Assessment & Plan Note (Addendum)
Improved. She will continue Gabapentin at night as needed. We discussed the importance of good lower extremity skin care, avoiding injuries and checking feet a few times per week.

## 2018-02-27 ENCOUNTER — Other Ambulatory Visit (INDEPENDENT_AMBULATORY_CARE_PROVIDER_SITE_OTHER): Payer: Medicare Other

## 2018-02-27 DIAGNOSIS — I10 Essential (primary) hypertension: Secondary | ICD-10-CM | POA: Diagnosis not present

## 2018-02-27 DIAGNOSIS — I739 Peripheral vascular disease, unspecified: Secondary | ICD-10-CM

## 2018-02-27 DIAGNOSIS — E785 Hyperlipidemia, unspecified: Secondary | ICD-10-CM | POA: Diagnosis not present

## 2018-02-27 LAB — LIPID PANEL
CHOL/HDL RATIO: 2
Cholesterol: 148 mg/dL (ref 0–200)
HDL: 59.8 mg/dL (ref >39.00)
LDL Cholesterol: 73 mg/dL (ref 0–99)
NONHDL: 88.46
Triglycerides: 77 mg/dL (ref 0.0–149.0)
VLDL: 15.4 mg/dL (ref 0.0–40.0)

## 2018-02-27 LAB — COMPREHENSIVE METABOLIC PANEL
ALT: 11 U/L (ref 0–35)
AST: 24 U/L (ref 0–37)
Albumin: 4.1 g/dL (ref 3.5–5.2)
Alkaline Phosphatase: 89 U/L (ref 39–117)
BILIRUBIN TOTAL: 0.5 mg/dL (ref 0.2–1.2)
BUN: 13 mg/dL (ref 6–23)
CO2: 31 meq/L (ref 19–32)
Calcium: 9.7 mg/dL (ref 8.4–10.5)
Chloride: 105 mEq/L (ref 96–112)
Creatinine, Ser: 0.75 mg/dL (ref 0.40–1.20)
GFR: 78.19 mL/min (ref >60.00)
GLUCOSE: 88 mg/dL (ref 70–99)
POTASSIUM: 4.1 meq/L (ref 3.5–5.1)
Sodium: 142 mEq/L (ref 135–145)
TOTAL PROTEIN: 6.4 g/dL (ref 6.0–8.3)

## 2018-02-28 ENCOUNTER — Encounter: Payer: Self-pay | Admitting: Family Medicine

## 2018-03-19 ENCOUNTER — Other Ambulatory Visit: Payer: Self-pay | Admitting: *Deleted

## 2018-03-19 NOTE — Progress Notes (Signed)
Patient ID: Carla Dawson, female   DOB: 25-Nov-1931, 84 y.o.   MRN: [id number] [place] with patient's daughter and discussed Dr. Stephens Shire plan for Angio first. Patient agreeable to proceed. Patient's daughter [name]. Instructed to be at [facility] admitting department at 10:30 am for angiogram. May have light breakfast (egg, toast and water) prior to 6 am on 03/24/18. NPO past 6 am. Take Amlodopine and use inhalers as needed am of this procedure and hold all other medication until after this procedure. Patient will be living with daughter for d/c home.

## 2018-03-20 ENCOUNTER — Other Ambulatory Visit: Payer: Self-pay | Admitting: Internal Medicine

## 2018-03-23 ENCOUNTER — Ambulatory Visit (INDEPENDENT_AMBULATORY_CARE_PROVIDER_SITE_OTHER): Payer: Medicare Other | Admitting: Internal Medicine

## 2018-03-23 ENCOUNTER — Other Ambulatory Visit: Payer: Self-pay | Admitting: *Deleted

## 2018-03-23 ENCOUNTER — Encounter: Payer: Self-pay | Admitting: Internal Medicine

## 2018-03-23 VITALS — BP 140/72 | HR 78 | Ht 61.0 in | Wt 110.0 lb

## 2018-03-23 DIAGNOSIS — I70213 Atherosclerosis of native arteries of extremities with intermittent claudication, bilateral legs: Secondary | ICD-10-CM | POA: Diagnosis not present

## 2018-03-23 DIAGNOSIS — J449 Chronic obstructive pulmonary disease, unspecified: Secondary | ICD-10-CM | POA: Diagnosis not present

## 2018-03-23 DIAGNOSIS — J441 Chronic obstructive pulmonary disease with (acute) exacerbation: Secondary | ICD-10-CM | POA: Diagnosis not present

## 2018-03-23 MED ORDER — AZITHROMYCIN 250 MG PO TABS
ORAL_TABLET | ORAL | 0 refills | Status: DC
Start: 2018-03-23 — End: 2018-04-03

## 2018-03-23 NOTE — Patient Instructions (Addendum)
zpak   Take your refillable prednisone and remember it is plan = D = Prednisone 10 mg take  4 each am x 2 days,   2 each am x 2 days,  1 each am x 2 days and stop.    Please schedule a follow up visit in 3 months but call sooner if needed

## 2018-03-23 NOTE — Progress Notes (Signed)
Subjective:    Patient ID: Carla Dawson, female    DOB: February 06, 1931,    MRN: [id number]     Brief patient profile:  18 yowf  Quit smoking 2007 with dx of copd in 2002 while living in [place] rx with Advair then Breo since quit smoking referred to pulmonary clinic 10/25/2016 by Dr   Betty Martinique   History of Present Illness  10/25/2016 1st [facility] office visit/ Osvaldo Lamping   Chief Complaint  Patient presents with  . Pulmonary Consult    Referred by Dr. Betty Martinique.  Pt here to est care for COPD. She c/o SOB with walking approx 800 ft on a flat surface, eating and walking up an incline. She has some PND but not coughing much.    MMRC2 = can't walk a nl pace on a flat grade s sob but does fine slow and flat eg walking up to 800 ft - she measures the distances herself to keep tack as also limited by claudication - rarely feels need for saba  rec Work on inhaler technique:  relax and gently blow all the way out then take a nice smooth deep breath back in, triggering the inhaler at same time you start breathing in.  Hold for up to 5 seconds if you can. Blow out thru nose. Rinse and gargle with water when done Plan A = Automatic = Symbicort 160 Take 2 puffs first thing in am and then another 2 puffs about 12 hours later.  Plan B = Backup Only use your albuterol (proair) as a rescue medication Plan C = Crisis - only use your albuterol nebulizer if you first try Plan B and it fails to help > ok to use the nebulizer up to every 4 hours but if start needing it regularly call for immediate appointment   02/27/2017  f/u ov/Tabetha Haraway re:  Copd / 02 prn maint on advair 250 bid and prn saba  Chief Complaint  Patient presents with  . Follow-up    Pt states was hospitalized while in [place] early March 2018. She states she was told that she may have had the beginning of PNA. She states today her breathing is "great". She has occ non prod cough. She was sent home with o2, but has  not used in the past several days.   breathing worse suddenly while in [place] not responsive  To albuterol or 2lpm > admit x 4 days and back to baseline  MMRC2 = can't walk a nl pace on a flat grade s sob but does fine slow and flat  rec Plan A = Automatic = Breo one click each am > take two good deep drags off it but click it just once  Plan B = Backup Only use your albuterol (PROAIR) as a rescue medication Plan C = Crisis - only use your albuterol nebulizer if you first try Plan B         10/14/2017  f/u ov/Seanpatrick Maisano re:   GOLD III / maint on BREO daily / has 02 / not using  Chief Complaint  Patient presents with  . Follow-up    Pt plans to go to [place] soon for a month and wants to discuss plan to "keep from having to take prednisone".  She states she is using her proair rarely, but has been using her neb recently due to chest tightness.   doe =  MMRC3 = can't walk 100 yards even at a slow pace  at a flat grade s stopping due to sob  Can do HT but not sam's   Very hoarse on breo 100/ poor hfa  Sleeps ok  rec Plan A = Automatic = dulera 100 Take 2 puffs first thing in am and then another 2 puffs about 12 hours later.  Work on inhaler technique: Plan B = Backup Only use your albuterol [employer])  as a rescue medication  Plan C = Crisis - only use your albuterol nebulizer if you first try Plan B and it fails to help > ok to use the nebulizer up to every 4 hours but if start needing it regularly call for immediate appointment Plan D = Deltasone take 6 days if you are having to use plan c and not working great Plan E  = ER, go there if all elso fails      02/04/2018  f/u ov/Forney Kleinpeter re:  COPDIII /one flare in Dec 2018 / BREO maint but req one course pred in last 3 m for aecopd  Chief Complaint  Patient presents with  . Follow-up    Last night she was eating and aspirated hamburger. She states that she feels pain in her throat when she takes a deep breath.  Her breathing is unchanged. She is  using her proair 3-4 x per wk on average and rarely uses neb.   Dyspnea:  MMRC3 = can't walk 100 yards even at a slow pace at a flat grade s stopping due to sob   Cough: not normally/  Sleep: ok/ no 02  SABA use:   No more than once a day/ maybe p shower  rec Plan A = Automatic = Breo or Trelegy daily  Work on inhaler technique:   Plan B = Backup Only use your albuterol (Proair)  as a rescue medication  Plan C = Crisis - only use your albuterol nebulizer if you first try Plan B Plan D = Deltasone take 6 days if you are having to use plan c and not working great Plan E  = ER, go there if all elso fails        03/23/2018 acute extended ov/Theoplis Garciagarcia re:  aecopd x 3 days/ did not start Plan D/ tol neb poorly due to tremors Chief Complaint  Patient presents with  . Acute Visit    productive cough yellow mucus, sob ,cough since 4/12   was doing about the same prior to flare 03/20/18 with cough/ congestion/ thick mucus / no fever c/p and comfortable at rest but trouble sleeping due to cough / chest tight    No obvious day to day or daytime variability or assoc  mucus plugs or hemoptysis or cp or subjective wheeze or overt sinus or hb symptoms. No unusual exposure hx or h/o childhood pna/ asthma or knowledge of premature birth.   Also denies any obvious fluctuation of symptoms with weather or environmental changes or other aggravating or alleviating factors except as outlined above   Current Allergies, Complete Past Medical History, Past Surgical History, Family History, and Social History were reviewed in [employer] record.  ROS  The following are not active complaints unless bolded Hoarseness, sore throat, dysphagia, dental problems, itching, sneezing,  nasal congestion or discharge of excess mucus or purulent secretions, ear ache,   fever, chills, sweats, unintended wt loss or wt gain, classically pleuritic or exertional cp,  orthopnea pnd or arm/hand swelling  or leg  swelling, presyncope, palpitations, abdominal pain, anorexia, nausea, vomiting, diarrhea  or change in bowel habits or change in bladder habits, change in stools or change in urine, dysuria, hematuria,  rash, arthralgias, visual complaints, headache, numbness, weakness or ataxia or problems with walking or coordination,  change in mood or  memory.        Current Meds  Medication Sig  . albuterol (PROVENTIL HFA;VENTOLIN HFA) 108 (90 Base) MCG/ACT inhaler INHALE 2 PUFFS BY MOUTH EVERY 4 HOURS AS NEEDED FOR WHEEZING  . albuterol (PROVENTIL) (2.5 MG/3ML) 0.083% nebulizer solution Take 3 mLs (2.5 mg total) by nebulization every 4 (four) hours as needed for wheezing or shortness of breath.  Marland Kitchen amLODipine (NORVASC) 5 MG tablet Take 1 tablet (5 mg total) by mouth at bedtime.  Marland Kitchen atorvastatin (LIPITOR) 10 MG tablet Take 1 tablet (10 mg total) by mouth daily.  . cetirizine (ZYRTEC) 10 MG tablet Take 10 mg by mouth daily.   . clopidogrel (PLAVIX) 75 MG tablet TAKE 1 TABLET(75 MG) BY MOUTH DAILY  . famotidine (PEPCID) 10 MG tablet Take 10 mg by mouth daily as needed for heartburn or indigestion.  . Fluticasone-Umeclidin-Vilant (TRELEGY ELLIPTA) 100-62.5-25 MCG/INH AEPB Inhale 1 puff into the lungs daily.  Marland Kitchen gabapentin (NEURONTIN) 300 MG capsule Take 1 capsule (300 mg total) by mouth at bedtime. (Patient taking differently: Take 300 mg by mouth at bedtime as needed (for pain). )  . Multiple Vitamin (MULTIVITAMIN WITH MINERALS) TABS tablet Take 1 tablet by mouth every evening.   . Probiotic Product (SOLUBLE FIBER/PROBIOTICS) CHEW Chew 1 each by mouth daily.                    Objective:   Physical Exam  amb wf nad/ congested sounding cough     03/23/2018       111  02/04/2018       109  10/14/2017       108  04/03/2017       100   02/27/2017      96  10/25/16 92 lb 9.6 oz (42 kg)  10/15/16 93 lb 9 oz (42.4 kg)  10/14/16 93 lb (42.2 kg)    Vital signs reviewed - Note on arrival 02 sats  95% on  RA             HEENT: nl   oropharynx. Nl external ear canals without cough reflex - moderate bilateral non-specific turbinate edema and full dentures     NECK :  without JVD/Nodes/TM/ nl carotid upstrokes bilaterally   LUNGS: no acc muscle use,  Mod barrel  contour chest wall with bilateral  Mid exp sonorous rhonchi and  without cough on insp or exp maneuver and mod   Hyperresonant  to  percussion bilaterally     CV:  RRR  no s3 or murmur or increase in P2, and no edema   ABD:  soft and nontender with pos mid insp Hoover's  in the supine position. No bruits or organomegaly appreciated, bowel sounds nl  MS:   Nl gait/  ext warm without deformities, calf tenderness, cyanosis or clubbing No obvious joint restrictions   SKIN: warm and dry without lesions    NEURO:  alert, approp, nl sensorium with  no motor or cerebellar deficits apparent.               Assessment & Plan:

## 2018-03-23 NOTE — Progress Notes (Signed)
Procedure date changed due to upper respiratory infection Dx. Rescheduled for 04/07/18 with patient's daughter.

## 2018-03-24 ENCOUNTER — Encounter: Payer: Self-pay | Admitting: Internal Medicine

## 2018-03-24 NOTE — Assessment & Plan Note (Addendum)
10/25/2016  try symbicort 160 2bid instead  of breo > preferred breo - 02/27/2017  After extensive coaching HFA effectiveness =    25% with hfa/ 75% with dpi so try back to BREO - PFT's  04/03/2017  FEV1 0.59 (37 % ) ratio 34  p 12  % improvement from saba p BREO  prior to study with DLCO  35/35 % corrects to 49  % for alv volume   - 10/14/2017    > try dulera 100 2bid due to hoarseness on breo   - 02/04/2018 trial of trelegy  Acute flare ? Due to URI and did not follow action plan > rx pred x 6 days  Will add zpak and reviewed action plan again/ advised to just use one half vial of alb for neb (Plan C)  if too shaky from the full amt and always try the hfa first = Plan B   - The proper method of use, as well as anticipated side effects, of a metered-dose inhaler are discussed and demonstrated to the patient.     I had an extended discussion with the patient reviewing all relevant studies completed to date and  lasting 15 to 20 minutes of a 25 minute acute office visit    Each maintenance medication was reviewed in detail including most importantly the difference between maintenance and prns and under what circumstances the prns are to be triggered using an action plan format that is not reflected in the computer generated alphabetically organized AVS.    Please see AVS for specific instructions unique to this visit that I personally wrote and verbalized to the the pt in detail and then reviewed with pt  by my nurse highlighting any  changes in therapy recommended at today's visit to their plan of care.

## 2018-03-24 NOTE — Assessment & Plan Note (Signed)
Onset 03/21/18 rx zpak/ Prednisone 10 mg take  4 each am x 2 days,   2 each am x 2 days,  1 each am x 2 days and stop  No change maint rx for now though if cough continues to be an issue may need to change to [facility] or hfa instead of dpi in future

## 2018-04-02 ENCOUNTER — Telehealth: Payer: Self-pay | Admitting: Internal Medicine

## 2018-04-02 NOTE — Telephone Encounter (Signed)
Called and spoke to patient's daughter, [name]. [name] stated that patient reported "When I was in [place] I would get shots of steroid and antibiotic and it would fix it."  Patient has been having continued chest congestion and productive yellow cough after completing oral prednisone and antibiotic. With further discussion scheduled patient for an acute visit with Dr. Melvyn Novas on 4/26 at 2pm. Nothing further is needed at this time.

## 2018-04-03 ENCOUNTER — Encounter: Payer: Self-pay | Admitting: Internal Medicine

## 2018-04-03 ENCOUNTER — Ambulatory Visit (INDEPENDENT_AMBULATORY_CARE_PROVIDER_SITE_OTHER): Payer: Medicare Other | Admitting: Internal Medicine

## 2018-04-03 VITALS — BP 148/74 | HR 80 | Temp 97.7°F | Ht 61.0 in | Wt 110.0 lb

## 2018-04-03 DIAGNOSIS — J449 Chronic obstructive pulmonary disease, unspecified: Secondary | ICD-10-CM

## 2018-04-03 DIAGNOSIS — J441 Chronic obstructive pulmonary disease with (acute) exacerbation: Secondary | ICD-10-CM | POA: Diagnosis not present

## 2018-04-03 DIAGNOSIS — I70213 Atherosclerosis of native arteries of extremities with intermittent claudication, bilateral legs: Secondary | ICD-10-CM

## 2018-04-03 MED ORDER — BUDESONIDE-FORMOTEROL FUMARATE 160-4.5 MCG/ACT IN AERO: 2.0000 | INHALATION_SPRAY | Freq: Two times a day (BID) | RESPIRATORY_TRACT | 11 refills | Status: DC

## 2018-04-03 MED ORDER — PREDNISONE 10 MG PO TABS
ORAL_TABLET | ORAL | 0 refills | Status: DC
Start: 2018-04-03 — End: 2018-04-03

## 2018-04-03 MED ORDER — PREDNISONE 10 MG PO TABS: ORAL_TABLET | ORAL | 0 refills | Status: DC

## 2018-04-03 MED ORDER — METHYLPREDNISOLONE ACETATE 80 MG/ML IJ SUSP
120.0000 mg | Freq: Once | INTRAMUSCULAR | Status: AC
Start: 2018-04-03 — End: 2018-04-03
  Administered 2018-04-03: 120 mg via INTRAMUSCULAR

## 2018-04-03 MED ORDER — TIOTROPIUM BROMIDE MONOHYDRATE 2.5 MCG/ACT IN AERS: 2.0000 | INHALATION_SPRAY | Freq: Every day | RESPIRATORY_TRACT | 11 refills | Status: DC

## 2018-04-03 NOTE — Assessment & Plan Note (Addendum)
10/25/2016  try symbicort 160 2bid instead  of breo > preferred breo - 02/27/2017  After extensive coaching HFA effectiveness =    25% with hfa/ 75% with dpi so try back to BREO - PFT's  04/03/2017  FEV1 0.59 (37 % ) ratio 34  p 12  % improvement from saba p BREO  prior to study with DLCO  35/35 % corrects to 49  % for alv volume   - 10/14/2017    > try dulera 100 2bid due to hoarseness on breo    - 02/04/2018 trial of trelegy>  Refractory aecopd 04/03/2018 > changed to symb 160/ spiriva 2.5 and pred 20 until better than taper off as Plan D   Not back to baseline on trelegy with lots of upper airway wheezes ? Dpi or gerd related  So should do better on hfa if she can master it    - The proper method of use, as well as anticipated side effects, of a metered-dose inhaler are discussed and demonstrated to the patient. Improved effectiveness after extensive coaching during this visit to a level of approximately 75 % from a baseline of 50 % so advised if not able to use effectively to change back to trelegy and in meantime added prednisone taper to "Plan D = 20 mg until completely back to baseline then taper off"  And use gerd rx whenever coughing     I had an extended discussion with the patient reviewing all relevant studies completed to date and  lasting 15 to 20 minutes of a 25 minute acute office  visit    Each maintenance medication was reviewed in detail including most importantly the difference between maintenance and prns and under what circumstances the prns are to be triggered using an action plan format that is not reflected in the computer generated alphabetically organized AVS.    Please see AVS for specific instructions unique to this visit that I personally wrote and verbalized to the the pt in detail and then reviewed with pt  by my nurse highlighting any  changes in therapy recommended at today's visit to their plan of care.

## 2018-04-03 NOTE — Patient Instructions (Addendum)
Depomedrol 120 mg today   Plan A = Automatic = stop trelegy for now and start symbicort 160 x 2 puffs then spiriva 2 pffs and 12 hours later just the symbicort 160 x 2 pffs  Work on inhaler technique:  relax and gently blow all the way out then take a nice smooth deep breath back in, triggering the inhaler at same time you start breathing in.  Hold for up to 5 seconds if you can. Blow out thru nose. Rinse and gargle with water when done  Plan B = Backup Only use your albuterol as a rescue medication to be used if you can't catch your breath by resting or doing a relaxed purse lip breathing pattern.  - The less you use it, the better it will work when you need it. - Ok to use the inhaler up to 2 puffs  every 4 hours if you must but call for appointment if use goes up over your usual need - Don't leave home without it !!  (think of it like the spare tire for your car)   Plan C = Crisis - only use your albuterol nebulizer if you first try Plan B and it fails to help > ok to use the nebulizer up to every 4 hours but if start needing it regularly call for immediate appointment   Plan D = Deltasone = prednisone 10 mg 2 daily until all better then 1 daily x 3 days and stop    For cough mucinex dm 1200 mg every 12 hours  Try prilosec otc 20mg   Take 30-60 min before first meal of the day and Pepcid ac (famotidine) 20 mg one @  bedtime until cough is completely gone for at least a week without the need for cough suppression   Return here when you come back for [place]

## 2018-04-03 NOTE — Progress Notes (Signed)
Subjective:    Patient ID: Carla Dawson, female    DOB: 09-20-1931,    MRN: [id number]     Brief patient profile:  55 yowf  Quit smoking 2007 with dx of copd in 2002 while living in [place] rx with Advair then Breo since quit smoking referred to pulmonary clinic 10/25/2016 by Dr   Betty Martinique   History of Present Illness  10/25/2016 1st [facility] office visit/ Linnae Rasool   Chief Complaint  Patient presents with  . Pulmonary Consult    Referred by Dr. Betty Martinique.  Pt here to est care for COPD. She c/o SOB with walking approx 800 ft on a flat surface, eating and walking up an incline. She has some PND but not coughing much.    MMRC2 = can't walk a nl pace on a flat grade s sob but does fine slow and flat eg walking up to 800 ft - she measures the distances herself to keep tack as also limited by claudication - rarely feels need for saba  rec Work on inhaler technique:  relax and gently blow all the way out then take a nice smooth deep breath back in, triggering the inhaler at same time you start breathing in.  Hold for up to 5 seconds if you can. Blow out thru nose. Rinse and gargle with water when done Plan A = Automatic = Symbicort 160 Take 2 puffs first thing in am and then another 2 puffs about 12 hours later.  Plan B = Backup Only use your albuterol (proair) as a rescue medication Plan C = Crisis - only use your albuterol nebulizer if you first try Plan B and it fails to help > ok to use the nebulizer up to every 4 hours but if start needing it regularly call for immediate appointment   02/27/2017  f/u ov/Jaquavious Mercer re:  Copd / 02 prn maint on advair 250 bid and prn saba  Chief Complaint  Patient presents with  . Follow-up    Pt states was hospitalized while in [place] early March 2018. She states she was told that she may have had the beginning of PNA. She states today her breathing is "great". She has occ non prod cough. She was sent home with o2, but has  not used in the past several days.   breathing worse suddenly while in [place] not responsive  To albuterol or 2lpm > admit x 4 days and back to baseline  MMRC2 = can't walk a nl pace on a flat grade s sob but does fine slow and flat  rec Plan A = Automatic = Breo one click each am > take two good deep drags off it but click it just once  Plan B = Backup Only use your albuterol (PROAIR) as a rescue medication Plan C = Crisis - only use your albuterol nebulizer if you first try Plan B         10/14/2017  f/u ov/Laquon Emel re:   GOLD III / maint on BREO daily / has 02 / not using  Chief Complaint  Patient presents with  . Follow-up    Pt plans to go to [place] soon for a month and wants to discuss plan to "keep from having to take prednisone".  She states she is using her proair rarely, but has been using her neb recently due to chest tightness.   doe =  MMRC3 = can't walk 100 yards even at a slow pace  at a flat grade s stopping due to sob  Can do HT but not sam's   Very hoarse on breo 100/ poor hfa  Sleeps ok  rec Plan A = Automatic = dulera 100 Take 2 puffs first thing in am and then another 2 puffs about 12 hours later.  Work on inhaler technique: Plan B = Backup Only use your albuterol [employer])  as a rescue medication  Plan C = Crisis - only use your albuterol nebulizer if you first try Plan B and it fails to help > ok to use the nebulizer up to every 4 hours but if start needing it regularly call for immediate appointment Plan D = Deltasone take 6 days if you are having to use plan c and not working great Plan E  = ER, go there if all elso fails      02/04/2018  f/u ov/Taichi Repka re:  COPDIII /one flare in Dec 2018 / BREO maint but req one course pred in last 3 m for aecopd  Chief Complaint  Patient presents with  . Follow-up    Last night she was eating and aspirated hamburger. She states that she feels pain in her throat when she takes a deep breath.  Her breathing is unchanged. She is  using her proair 3-4 x per wk on average and rarely uses neb.   Dyspnea:  MMRC3 = can't walk 100 yards even at a slow pace at a flat grade s stopping due to sob   Cough: not normally/  Sleep: ok/ no 02  SABA use:   No more than once a day/ maybe p shower  rec Plan A = Automatic = Breo or Trelegy daily  Work on inhaler technique:   Plan B = Backup Only use your albuterol (Proair)  as a rescue medication  Plan C = Crisis - only use your albuterol nebulizer if you first try Plan B Plan D = Deltasone take 6 days if you are having to use plan c and not working great Plan E  = ER, go there if all elso fails        03/23/2018 acute extended ov/Kjell Brannen re:  aecopd x 3 days/ did not start Plan D/ tol neb poorly due to tremors Chief Complaint  Patient presents with  . Acute Visit    productive cough yellow mucus, sob ,cough since 4/12   was doing about the same prior to flare 03/20/18 with cough/ congestion/ thick mucus / no fever c/p and comfortable at rest but trouble sleeping due to cough / chest tight  rec zpak  Take your refillable prednisone and remember it is plan = D = Prednisone 10 mg take  4 each am x 2 days,   2 each am x 2 days,  1 each am x 2 days and stop.       04/03/2018 acute extended ov/Ottie Tillery re:  aecopd  Chief Complaint  Patient presents with  . Acute Visit    Improved slightly after visit 03/23/18 and then worsened again 2-3 days ago- cough with light yellow sputum and increased SOB. She is using her albuterol inhaler 4-5 x per day and neb at least 3 x per day.      on trelegy p zpak / prednisone was only a little  better as it turns out = ok only at rest whereas baseline = walking house x 500 steps at a time s stopping - did not less saba need p prednisone but concerned  getting worse x 3 d prior to OV  And needs vascular sugery 04/07/18 and doesn't want to be turned down  Mucus turned clear on zpak, then slt yellow and thick since despite rx with mucinex   No obvious day to  day or daytime variability or assoc excess/ purulent sputum or mucus plugs or hemoptysis or cp or chest tightness, subjective wheeze or overt sinus or hb symptoms. No unusual exposure hx or h/o childhood pna/ asthma or knowledge of premature birth.  Sleeping  Ok on 2 pillows or less   without nocturnal   exacerbation  of respiratory  c/o's or need for noct saba. Also denies any obvious fluctuation of symptoms with weather or environmental changes or other aggravating or alleviating factors except as outlined above   Current Allergies, Complete Past Medical History, Past Surgical History, Family History, and Social History were reviewed in [employer] record.  ROS  The following are not active complaints unless bolded Hoarseness, sore throat, dysphagia, dental problems, itching, sneezing,  nasal congestion or discharge of excess mucus or purulent secretions, ear ache,   fever, chills, sweats, unintended wt loss or wt gain, classically pleuritic or exertional cp,  orthopnea pnd or arm/hand swelling  or leg swelling, presyncope, palpitations, abdominal pain, anorexia, nausea, vomiting, diarrhea  or change in bowel habits or change in bladder habits, change in stools or change in urine, dysuria, hematuria,  rash, arthralgias, visual complaints, headache, numbness, weakness or ataxia or problems with walking or coordination,  change in mood or  memory.        Current Meds  Medication Sig  . albuterol (PROVENTIL HFA;VENTOLIN HFA) 108 (90 Base) MCG/ACT inhaler INHALE 2 PUFFS BY MOUTH EVERY 4 HOURS AS NEEDED FOR WHEEZING  . albuterol (PROVENTIL) (2.5 MG/3ML) 0.083% nebulizer solution Take 3 mLs (2.5 mg total) by nebulization every 4 (four) hours as needed for wheezing or shortness of breath.  Marland Kitchen amLODipine (NORVASC) 5 MG tablet Take 1 tablet (5 mg total) by mouth at bedtime.  Marland Kitchen atorvastatin (LIPITOR) 10 MG tablet Take 1 tablet (10 mg total) by mouth daily.  . cetirizine (ZYRTEC) 10 MG  tablet Take 10 mg by mouth daily.   . clopidogrel (PLAVIX) 75 MG tablet TAKE 1 TABLET(75 MG) BY MOUTH DAILY  . gabapentin (NEURONTIN) 300 MG capsule Take 1 capsule (300 mg total) by mouth at bedtime. (Patient taking differently: Take 300 mg by mouth at bedtime as needed (for pain). )  . Multiple Vitamin (MULTIVITAMIN WITH MINERALS) TABS tablet Take 1 tablet by mouth every evening.   . Probiotic Product (SOLUBLE FIBER/PROBIOTICS) CHEW Chew 1 each by mouth daily.   . [DISCONTINUED] famotidine (PEPCID) 10 MG tablet Take 10 mg by mouth daily as needed for heartburn or indigestion.  . [DISCONTINUED] Fluticasone-Umeclidin-Vilant (TRELEGY ELLIPTA) 100-62.5-25 MCG/INH AEPB Inhale 1 puff into the lungs daily.                   Objective:   Physical Exam  amb hoarse wf /rattling congested sounding cough with pseudowheezing better with plm    04/03/2018       110  03/23/2018       111  02/04/2018       109  10/14/2017       108  04/03/2017       100   02/27/2017      96  10/25/16 92 lb 9.6 oz (42 kg)  10/15/16 93 lb 9 oz (42.4 kg)  10/14/16 93 lb (42.2 kg)     Vital signs reviewed - Note on arrival 02 sats  92% on RA          HEENT: nl   oropharynx. Nl external ear canals without cough reflex - moderate bilateral non-specific turbinate edema  / full dentures   NECK :  without JVD/Nodes/TM/ nl carotid upstrokes bilaterally   LUNGS: no acc muscle use,  Mod barrel  contour chest wall with bilateral exp rhonchi  and  without cough on insp or exp maneuver and mod   Hyperresonant  to  percussion bilaterally     CV:  RRR  no s3 or murmur or increase in P2, and no edema   ABD:  soft and nontender with pos mid insp Hoover's  in the supine position. No bruits or organomegaly appreciated, bowel sounds nl  MS:   Nl gait/  ext warm without deformities, calf tenderness, cyanosis or clubbing No obvious joint restrictions   SKIN: warm and dry without lesions    NEURO:  alert, approp, nl  sensorium with  no motor or cerebellar deficits apparent.                  Assessment & Plan:

## 2018-04-04 ENCOUNTER — Encounter: Payer: Self-pay | Admitting: Internal Medicine

## 2018-04-04 NOTE — Assessment & Plan Note (Addendum)
DDX of  difficult airways management almost all start with A and  include Adherence, Ace Inhibitors, Acid Reflux, Active Sinus Disease, Alpha 1 Antitripsin deficiency, Anxiety masquerading as Airways dz,  ABPA,  Allergy(esp in young), Aspiration (esp in elderly), Adverse effects of meds,  Active smokers, A bunch of PE's (a small clot burden can't cause this syndrome unless there is already severe underlying pulm or vascular dz with poor reserve) plus two Bs  = Bronchiectasis and Beta blocker use..and one C= CHF   Adherence is always the initial "prime suspect" and is a multilayered concern that requires a "trust but verify" approach in every patient - starting with knowing how to use medications, especially inhalers, correctly, keeping up with refills and understanding the fundamental difference between maintenance and prns vs those medications only taken for a very short course and then stopped and not refilled.  - see hfa teaching under copd - return with all meds in hand using a trust but verify approach to confirm accurate Medication  Reconciliation The principal here is that until we are certain that the  patients are doing what we've asked, it makes no sense to ask them to do more.   ? Acid (or non-acid) GERD > always difficult to exclude as up to 75% of pts in some series report no assoc GI/ Heartburn symptoms> rec max (24h)  acid suppression and diet restrictions/ reviewed and instructions given in writing.  rx for gerd during flares to reduce risk of cough > gerd > cough cycle  ? Adverse effects of dpi > try off   ? Allergy/ asthma > depomedrol 120 mg IM/ pred 20 until better completely back to baseline then and only then taper back off

## 2018-04-07 ENCOUNTER — Encounter (HOSPITAL_COMMUNITY)
Admission: RE | Disposition: A | Discharge status: Home / Self Care | Payer: Self-pay | Source: Ambulatory Visit | Attending: Surgery

## 2018-04-07 ENCOUNTER — Ambulatory Visit (HOSPITAL_COMMUNITY)
Admission: RE | Admit: 2018-04-07 | Discharge: 2018-04-07 | Disposition: A | Discharge status: Home / Self Care | Payer: Medicare Other | Source: Ambulatory Visit | Attending: Surgery | Admitting: Surgery

## 2018-04-07 DIAGNOSIS — Z7902 Long term (current) use of antithrombotics/antiplatelets: Secondary | ICD-10-CM | POA: Diagnosis not present

## 2018-04-07 DIAGNOSIS — E7800 Pure hypercholesterolemia, unspecified: Secondary | ICD-10-CM | POA: Diagnosis not present

## 2018-04-07 DIAGNOSIS — Z7982 Long term (current) use of aspirin: Secondary | ICD-10-CM | POA: Diagnosis not present

## 2018-04-07 DIAGNOSIS — G629 Polyneuropathy, unspecified: Secondary | ICD-10-CM | POA: Diagnosis not present

## 2018-04-07 DIAGNOSIS — Z87891 Personal history of nicotine dependence: Secondary | ICD-10-CM | POA: Diagnosis not present

## 2018-04-07 DIAGNOSIS — I10 Essential (primary) hypertension: Secondary | ICD-10-CM | POA: Diagnosis not present

## 2018-04-07 DIAGNOSIS — I70212 Atherosclerosis of native arteries of extremities with intermittent claudication, left leg: Secondary | ICD-10-CM

## 2018-04-07 DIAGNOSIS — Z79899 Other long term (current) drug therapy: Secondary | ICD-10-CM | POA: Diagnosis not present

## 2018-04-07 DIAGNOSIS — I70201 Unspecified atherosclerosis of native arteries of extremities, right leg: Secondary | ICD-10-CM | POA: Diagnosis not present

## 2018-04-07 DIAGNOSIS — I743 Embolism and thrombosis of arteries of the lower extremities: Secondary | ICD-10-CM | POA: Diagnosis not present

## 2018-04-07 DIAGNOSIS — Z7951 Long term (current) use of inhaled steroids: Secondary | ICD-10-CM | POA: Diagnosis not present

## 2018-04-07 DIAGNOSIS — I779 Disorder of arteries and arterioles, unspecified: Secondary | ICD-10-CM | POA: Diagnosis not present

## 2018-04-07 DIAGNOSIS — J449 Chronic obstructive pulmonary disease, unspecified: Secondary | ICD-10-CM | POA: Diagnosis not present

## 2018-04-07 HISTORY — PX: PERIPHERAL VASCULAR INTERVENTION: CATH118257

## 2018-04-07 HISTORY — PX: LOWER EXTREMITY ANGIOGRAPHY: CATH118251

## 2018-04-07 LAB — POCT I-STAT, CHEM 8
BUN: 17 mg/dL (ref 6–20)
CALCIUM ION: 1.15 mmol/L (ref 1.15–1.40)
CHLORIDE: 105 mmol/L (ref 101–111)
Creatinine, Ser: 0.8 mg/dL (ref 0.44–1.00)
Glucose, Bld: 84 mg/dL (ref 65–99)
HEMATOCRIT: 39 % (ref 36.0–46.0)
Hemoglobin: 13.3 g/dL (ref 12.0–15.0)
Potassium: 3.9 mmol/L (ref 3.5–5.1)
SODIUM: 140 mmol/L (ref 135–145)
TCO2: 26 mmol/L (ref 22–32)

## 2018-04-07 LAB — POCT ACTIVATED CLOTTING TIME: ACTIVATED CLOTTING TIME: 268 s

## 2018-04-07 SURGERY — LOWER EXTREMITY ANGIOGRAPHY
Anesthesia: LOCAL | Laterality: Left

## 2018-04-07 MED ORDER — ASPIRIN EC 81 MG PO TBEC: 81.0000 mg | DELAYED_RELEASE_TABLET | Freq: Every day | ORAL | Status: DC

## 2018-04-07 MED ORDER — HYDRALAZINE HCL 20 MG/ML IJ SOLN: 5.0000 mg | INTRAMUSCULAR | Status: DC | PRN

## 2018-04-07 MED ORDER — SODIUM CHLORIDE 0.9% FLUSH: 3.0000 mL | INTRAVENOUS | Status: DC | PRN

## 2018-04-07 MED ORDER — HEPARIN SODIUM (PORCINE) 1000 UNIT/ML IJ SOLN
INTRAMUSCULAR | Status: DC | PRN
  Administered 2018-04-07: 5000 [IU] via INTRAVENOUS

## 2018-04-07 MED ORDER — LABETALOL HCL 5 MG/ML IV SOLN
10.0000 mg | INTRAVENOUS | Status: DC | PRN
Start: 2018-04-07 — End: 2018-04-07

## 2018-04-07 MED ORDER — SODIUM CHLORIDE 0.9 % IV SOLN
INTRAVENOUS | Status: DC
  Administered 2018-04-07: 10:00:00 via INTRAVENOUS

## 2018-04-07 MED ORDER — HEPARIN (PORCINE) IN NACL 1000-0.9 UT/500ML-% IV SOLN
INTRAVENOUS | Status: AC
  Filled 2018-04-07: qty 1000

## 2018-04-07 MED ORDER — MIDAZOLAM HCL 2 MG/2ML IJ SOLN
INTRAMUSCULAR | Status: AC
  Filled 2018-04-07: qty 2

## 2018-04-07 MED ORDER — HEPARIN (PORCINE) IN NACL 2-0.9 UNITS/ML
INTRAMUSCULAR | Status: AC | PRN
  Administered 2018-04-07 (×2): 500 mL via INTRA_ARTERIAL

## 2018-04-07 MED ORDER — SODIUM CHLORIDE 0.9% FLUSH: 3.0000 mL | Freq: Two times a day (BID) | INTRAVENOUS | Status: DC

## 2018-04-07 MED ORDER — MIDAZOLAM HCL 2 MG/2ML IJ SOLN
INTRAMUSCULAR | Status: DC | PRN
  Administered 2018-04-07: 1 mg via INTRAVENOUS

## 2018-04-07 MED ORDER — CLOPIDOGREL BISULFATE 75 MG PO TABS: 75.0000 mg | ORAL_TABLET | Freq: Every day | ORAL | Status: DC

## 2018-04-07 MED ORDER — ACETAMINOPHEN 325 MG PO TABS: 650.0000 mg | ORAL_TABLET | ORAL | Status: DC | PRN

## 2018-04-07 MED ORDER — SODIUM CHLORIDE 0.9 % IV SOLN: 250.0000 mL | INTRAVENOUS | Status: DC | PRN

## 2018-04-07 MED ORDER — NITROGLYCERIN 1 MG/10 ML FOR IR/CATH LAB
INTRA_ARTERIAL | Status: DC | PRN
  Administered 2018-04-07: 800 ug via INTRA_ARTERIAL

## 2018-04-07 MED ORDER — FENTANYL CITRATE (PF) 100 MCG/2ML IJ SOLN
INTRAMUSCULAR | Status: AC
  Filled 2018-04-07: qty 2

## 2018-04-07 MED ORDER — NITROGLYCERIN 1 MG/10 ML FOR IR/CATH LAB
INTRA_ARTERIAL | Status: AC
  Filled 2018-04-07: qty 10

## 2018-04-07 MED ORDER — LIDOCAINE HCL (PF) 1 % IJ SOLN
INTRAMUSCULAR | Status: AC
  Filled 2018-04-07: qty 30

## 2018-04-07 MED ORDER — FENTANYL CITRATE (PF) 100 MCG/2ML IJ SOLN
INTRAMUSCULAR | Status: DC | PRN
  Administered 2018-04-07: 25 ug via INTRAVENOUS

## 2018-04-07 MED ORDER — ONDANSETRON HCL 4 MG/2ML IJ SOLN: 4.0000 mg | Freq: Four times a day (QID) | INTRAMUSCULAR | Status: DC | PRN

## 2018-04-07 MED ORDER — SODIUM CHLORIDE 0.9 % IV SOLN: INTRAVENOUS | Status: DC

## 2018-04-07 SURGICAL SUPPLY — 20 items
BALLN MUSTANG 5X200X135 (BALLOONS) ×3
BALLOON MUSTANG 5X200X135 (BALLOONS) ×2 IMPLANT
CATH OMNI FLUSH 5F 65CM (CATHETERS) ×3 IMPLANT
COVER PRB 48X5XTLSCP FOLD TPE (BAG) ×2 IMPLANT
COVER PROBE 5X48 (BAG) ×1
DEVICE CLOSURE MYNXGRIP 6/7F (Vascular Products) ×3 IMPLANT
DRAPE ZERO GRAVITY STERILE (DRAPES) ×3 IMPLANT
KIT ENCORE 26 ADVANTAGE (KITS) ×3 IMPLANT
KIT MICROPUNCTURE NIT STIFF (SHEATH) ×3 IMPLANT
KIT PV (KITS) ×3 IMPLANT
SHEATH AVANTI 11CM 5FR (SHEATH) ×3 IMPLANT
SHEATH AVANTI 11CM 7FR (SHEATH) ×3 IMPLANT
SHEATH PINNACLE ST 7F 45CM (SHEATH) ×3 IMPLANT
STENT ELUVIA 6X120X130 (Permanent Stent) ×9 IMPLANT
SYR MEDRAD MARK V 150ML (SYRINGE) ×3 IMPLANT
TRANSDUCER W/STOPCOCK (MISCELLANEOUS) ×3 IMPLANT
TRAY PV CATH (CUSTOM PROCEDURE TRAY) ×3 IMPLANT
WIRE BENTSON .035X145CM (WIRE) ×3 IMPLANT
WIRE HI TORQ VERSACORE 300 (WIRE) ×3 IMPLANT
WIRE ROSEN-J .035X260CM (WIRE) ×3 IMPLANT

## 2018-04-07 NOTE — Op Note (Signed)
Patient name: Carla Dawson MRN: [id number] DOB: 1931-03-18 Sex: female  04/07/2018 Pre-operative Diagnosis: Left leg claudication Post-operative diagnosis:  Same Surgeon:  [name] Procedure Performed:  1.  Ultrasound-guided access, right femoral artery  2.  Abdominal aortogram  3.  Left lower extremity runoff  4.  Drug-eluting stent placement, left superficial femoral and popliteal artery  5.  Conscious sedation (64 minutes)     Indications: The patient is undergone multiple percutaneous and surgical interventions.  She has had early recurrence.  She now has become symptomatic again.  Consideration is for surgical intervention however the patient's overall health has declined and therefore if there are percutaneous options remaining we may attempt to treat a percutaneously today.  Procedure:  The patient was identified in the holding area and taken to room 8.  The patient was then placed supine on the table and prepped and draped in the usual sterile fashion.  A time out was called.  Ultrasound was used to evaluate the right common femoral artery.  It was patent .  A digital ultrasound image was acquired.  A micropuncture needle was used to access the right common femoral artery under ultrasound guidance.  An 018 wire was advanced without resistance and a micropuncture sheath was placed.  The 018 wire was removed and a benson wire was placed.  The micropuncture sheath was exchanged for a 5 french sheath.  An omniflush catheter was advanced over the wire to the level of L-1.  An abdominal angiogram was obtained.  Next, using the omniflush catheter and a benson wire, the aortic bifurcation was crossed and the catheter was placed into theleft external iliac artery and left runoff was obtained.    Findings:   Aortogram: The infrarenal abdominal aorta is widely patent.  Bilateral common and external iliac arteries are patent without significant stenosis.  There is no renal artery  stenosis.    Right Lower Extremity:  Not evaluated  Left Lower Extremity: A 50% stenosis is identified within the left common femoral artery.  The profundofemoral artery is small in caliber but patent.  Superficial femoral artery is patent.  There are several areas of greater than 90% stenosis within the previously stented superficial femoral artery.  The popliteal artery also shows luminal narrowing greater than 60% down to the patella.  There is two-vessel runoff via the posterior tibial peroneal artery.  Intervention: After the above images were acquired the decision was made to proceed with intervention.  Over a Rosen wire a 7 French sheath was placed into the left external iliac artery.  The patient was fully heparinized.  A versa core wire was then advanced stents into the popliteal artery.  I elected to primarily treat these using drug-eluting Elluvia 6x120 stents.  3 stents were placed in overlapping fashion and then ballooned with a 5 x 200 Mustang balloon.  Follow-up imaging was performed.  There was some narrowing at the proximal and distal edges of the stents which I felt was more related to size mismatch or spasm.  Therefore 800 mcg of nitroglycerin was then administered.  Follow-up imaging continued to show luminal discrepancy between the native artery and the edge of the stent both proximally distally.  I felt this was more size mismatch than anything else and elected not to address this at this time.  Catheters and wires were then removed.  The 7 French sheath was exchanged out for a short 7 [place] sheath and a Mynx device was used for successful  closure.  Impression:  #1  In-stent stenosis of the left superficial femoral-popliteal artery.  This was successfully treated using overlapping 6 mm drug-eluting Elluvia stents    V. [name], M.D. [facility] Office: [phone] Pager:  [phone]

## 2018-04-07 NOTE — H&P (Signed)
   Patient name: Kyasia Steuck MRN: [id number] DOB: 09-01-1931 Sex: female  REASON FOR VISIT:    HISTORY OF PRESENT ILLNESS:   Ajee Heasley is a 84 y.o. female with multiple vascular proceedures, now with worsening left elg pain.    CURRENT MEDICATIONS:    Current Facility-Administered Medications  Medication Dose Route Frequency Provider Last Rate Last Dose  . 0.9 %  sodium chloride infusion   Intravenous Continuous Serafina Mitchell, MD 100 mL/hr at 04/07/18 1021      REVIEW OF SYSTEMS:   [X]  denotes positive finding, [ ]  denotes negative finding Cardiac  Comments:  Chest pain or chest pressure:    Shortness of breath upon exertion:    Short of breath when lying flat:    Irregular heart rhythm:    Constitutional    Fever or chills:      PHYSICAL EXAM:   Vitals:   04/07/18 0917  Pulse: 70  Resp: 18  Temp: 97.7 F (36.5 C)  TempSrc: Oral  SpO2: 95%  Weight: 110 lb (49.9 kg)  Height: 5\' 1"  (1.549 m)    GENERAL: The patient is a well-nourished female, in no acute distress. The vital signs are documented above. CARDIOVASCULAR: There is a regular rate and rhythm. PULMONARY: Non-labored respirations   STUDIES:   none   MEDICAL ISSUES:   Angiogram with runoff for surgery planning  [name], MD [facility] [phone] Pager [phone]

## 2018-04-07 NOTE — Discharge Instructions (Signed)

## 2018-04-08 ENCOUNTER — Encounter (HOSPITAL_COMMUNITY): Payer: Self-pay | Admitting: Surgery

## 2018-04-08 MED FILL — Lidocaine HCl Local Preservative Free (PF) Inj 1%: INTRAMUSCULAR | Qty: 30 | Status: AC

## 2018-04-08 MED FILL — Heparin Sod (Porcine)-NaCl IV Soln 1000 Unit/500ML-0.9%: INTRAVENOUS | Qty: 1000 | Status: AC

## 2018-04-09 ENCOUNTER — Emergency Department (HOSPITAL_COMMUNITY): Payer: Medicare Other | Admitting: Anesthesiology

## 2018-04-09 ENCOUNTER — Encounter (HOSPITAL_COMMUNITY): Payer: Self-pay

## 2018-04-09 ENCOUNTER — Emergency Department (HOSPITAL_BASED_OUTPATIENT_CLINIC_OR_DEPARTMENT_OTHER)
Admit: 2018-04-09 | Discharge: 2018-04-09 | Disposition: A | Discharge status: Home / Self Care | Payer: Medicare Other | Attending: Emergency Medicine | Admitting: Emergency Medicine

## 2018-04-09 ENCOUNTER — Encounter (HOSPITAL_COMMUNITY)
Admission: EM | Disposition: A | Discharge status: Home Health | Payer: Self-pay | Source: Home / Self Care | Attending: Vascular Surgery

## 2018-04-09 ENCOUNTER — Ambulatory Visit: Payer: Self-pay | Admitting: *Deleted

## 2018-04-09 ENCOUNTER — Other Ambulatory Visit: Payer: Self-pay

## 2018-04-09 ENCOUNTER — Telehealth: Payer: Self-pay | Admitting: *Deleted

## 2018-04-09 ENCOUNTER — Inpatient Hospital Stay (HOSPITAL_COMMUNITY)
Admission: EM | Admit: 2018-04-09 | Discharge: 2018-04-10 | DRG: 271 | Disposition: A | Discharge status: Home Health | Payer: Medicare Other | Attending: Vascular Surgery | Admitting: Vascular Surgery

## 2018-04-09 DIAGNOSIS — J449 Chronic obstructive pulmonary disease, unspecified: Secondary | ICD-10-CM | POA: Diagnosis present

## 2018-04-09 DIAGNOSIS — R252 Cramp and spasm: Secondary | ICD-10-CM

## 2018-04-09 DIAGNOSIS — E7800 Pure hypercholesterolemia, unspecified: Secondary | ICD-10-CM | POA: Diagnosis present

## 2018-04-09 DIAGNOSIS — I998 Other disorder of circulatory system: Secondary | ICD-10-CM | POA: Diagnosis not present

## 2018-04-09 DIAGNOSIS — Z87891 Personal history of nicotine dependence: Secondary | ICD-10-CM | POA: Diagnosis not present

## 2018-04-09 DIAGNOSIS — I70201 Unspecified atherosclerosis of native arteries of extremities, right leg: Secondary | ICD-10-CM | POA: Diagnosis not present

## 2018-04-09 DIAGNOSIS — Z7951 Long term (current) use of inhaled steroids: Secondary | ICD-10-CM | POA: Diagnosis not present

## 2018-04-09 DIAGNOSIS — I739 Peripheral vascular disease, unspecified: Secondary | ICD-10-CM | POA: Diagnosis present

## 2018-04-09 DIAGNOSIS — Z79899 Other long term (current) drug therapy: Secondary | ICD-10-CM | POA: Diagnosis not present

## 2018-04-09 DIAGNOSIS — Z7982 Long term (current) use of aspirin: Secondary | ICD-10-CM | POA: Diagnosis not present

## 2018-04-09 DIAGNOSIS — Z7902 Long term (current) use of antithrombotics/antiplatelets: Secondary | ICD-10-CM

## 2018-04-09 DIAGNOSIS — I743 Embolism and thrombosis of arteries of the lower extremities: Secondary | ICD-10-CM | POA: Diagnosis present

## 2018-04-09 DIAGNOSIS — I10 Essential (primary) hypertension: Secondary | ICD-10-CM | POA: Diagnosis present

## 2018-04-09 DIAGNOSIS — I779 Disorder of arteries and arterioles, unspecified: Secondary | ICD-10-CM | POA: Diagnosis not present

## 2018-04-09 DIAGNOSIS — G629 Polyneuropathy, unspecified: Secondary | ICD-10-CM | POA: Diagnosis present

## 2018-04-09 DIAGNOSIS — Z9889 Other specified postprocedural states: Secondary | ICD-10-CM | POA: Diagnosis not present

## 2018-04-09 DIAGNOSIS — M79609 Pain in unspecified limb: Secondary | ICD-10-CM

## 2018-04-09 DIAGNOSIS — E785 Hyperlipidemia, unspecified: Secondary | ICD-10-CM

## 2018-04-09 HISTORY — PX: EMBOLECTOMY: SHX44

## 2018-04-09 LAB — COMPREHENSIVE METABOLIC PANEL
ALK PHOS: 76 U/L (ref 38–126)
ALT: 11 U/L — AB (ref 14–54)
AST: 21 U/L (ref 15–41)
Albumin: 3.3 g/dL — ABNORMAL LOW (ref 3.5–5.0)
Anion gap: 6 (ref 5–15)
BUN: 14 mg/dL (ref 6–20)
CALCIUM: 8.8 mg/dL — AB (ref 8.9–10.3)
CO2: 29 mmol/L (ref 22–32)
CREATININE: 0.78 mg/dL (ref 0.44–1.00)
Chloride: 101 mmol/L (ref 101–111)
GFR calc Af Amer: >60 mL/min (ref >60)
Glucose, Bld: 123 mg/dL — ABNORMAL HIGH (ref 65–99)
Potassium: 3.5 mmol/L (ref 3.5–5.1)
SODIUM: 136 mmol/L (ref 135–145)
Total Bilirubin: 0.7 mg/dL (ref 0.3–1.2)
Total Protein: 5.9 g/dL — ABNORMAL LOW (ref 6.5–8.1)

## 2018-04-09 LAB — CBC WITH DIFFERENTIAL/PLATELET
Basophils Absolute: 0 10*3/uL (ref 0.0–0.1)
Basophils Relative: 0 %
Eosinophils Absolute: 0.2 10*3/uL (ref 0.0–0.7)
Eosinophils Relative: 1 %
HCT: 36.4 % (ref 36.0–46.0)
HEMOGLOBIN: 12 g/dL (ref 12.0–15.0)
LYMPHS PCT: 11 %
Lymphs Abs: 1.6 10*3/uL (ref 0.7–4.0)
MCH: 29.5 pg (ref 26.0–34.0)
MCHC: 33 g/dL (ref 30.0–36.0)
MCV: 89.4 fL (ref 78.0–100.0)
Monocytes Absolute: 1.8 10*3/uL — ABNORMAL HIGH (ref 0.1–1.0)
Monocytes Relative: 13 %
NEUTROS PCT: 75 %
Neutro Abs: 10.6 10*3/uL — ABNORMAL HIGH (ref 1.7–7.7)
Platelets: 171 10*3/uL (ref 150–400)
RBC: 4.07 MIL/uL (ref 3.87–5.11)
RDW: 13.3 % (ref 11.5–15.5)
WBC: 14.2 10*3/uL — ABNORMAL HIGH (ref 4.0–10.5)

## 2018-04-09 LAB — I-STAT CG4 LACTIC ACID, ED: Lactic Acid, Venous: 0.95 mmol/L (ref 0.5–1.9)

## 2018-04-09 SURGERY — EMBOLECTOMY
Anesthesia: General | Laterality: Right

## 2018-04-09 MED ORDER — MAGNESIUM SULFATE 2 GM/50ML IV SOLN: 2.0000 g | Freq: Every day | INTRAVENOUS | Status: DC | PRN

## 2018-04-09 MED ORDER — POTASSIUM CHLORIDE CRYS ER 20 MEQ PO TBCR: 20.0000 meq | EXTENDED_RELEASE_TABLET | Freq: Every day | ORAL | Status: DC | PRN

## 2018-04-09 MED ORDER — SOLUBLE FIBER/PROBIOTICS PO CHEW: 1.0000 | CHEWABLE_TABLET | Freq: Every day | ORAL | Status: DC

## 2018-04-09 MED ORDER — ONDANSETRON HCL 4 MG/2ML IJ SOLN
4.0000 mg | Freq: Once | INTRAMUSCULAR | Status: DC | PRN
Start: 2018-04-09 — End: 2018-04-09

## 2018-04-09 MED ORDER — DOCUSATE SODIUM 100 MG PO CAPS
100.0000 mg | ORAL_CAPSULE | Freq: Every day | ORAL | Status: DC
  Administered 2018-04-10: 100 mg via ORAL
  Filled 2018-04-09: qty 1

## 2018-04-09 MED ORDER — ROCURONIUM BROMIDE 100 MG/10ML IV SOLN
INTRAVENOUS | Status: DC | PRN
  Administered 2018-04-09: 20 mg via INTRAVENOUS
  Administered 2018-04-09: 40 mg via INTRAVENOUS

## 2018-04-09 MED ORDER — CEFAZOLIN SODIUM-DEXTROSE 1-4 GM/50ML-% IV SOLN
1.0000 g | INTRAVENOUS | Status: AC
  Administered 2018-04-09: 1 g via INTRAVENOUS
  Filled 2018-04-09: qty 50

## 2018-04-09 MED ORDER — 0.9 % SODIUM CHLORIDE (POUR BTL) OPTIME
TOPICAL | Status: DC | PRN
  Administered 2018-04-09: 1000 mL

## 2018-04-09 MED ORDER — SODIUM CHLORIDE 0.9 % IV SOLN
INTRAVENOUS | Status: AC
  Filled 2018-04-09: qty 1.2

## 2018-04-09 MED ORDER — FENTANYL CITRATE (PF) 100 MCG/2ML IJ SOLN: 25.0000 ug | Freq: Once | INTRAMUSCULAR | Status: DC

## 2018-04-09 MED ORDER — GUAIFENESIN-DM 100-10 MG/5ML PO SYRP: 15.0000 mL | ORAL_SOLUTION | ORAL | Status: DC | PRN

## 2018-04-09 MED ORDER — SODIUM CHLORIDE 0.9 % IV SOLN
INTRAVENOUS | Status: DC
  Administered 2018-04-09: 21:00:00 via INTRAVENOUS

## 2018-04-09 MED ORDER — LIDOCAINE HCL (CARDIAC) PF 100 MG/5ML IV SOSY
PREFILLED_SYRINGE | INTRAVENOUS | Status: DC | PRN
  Administered 2018-04-09: 100 mg via INTRAVENOUS

## 2018-04-09 MED ORDER — ARFORMOTEROL TARTRATE 15 MCG/2ML IN NEBU
15.0000 ug | INHALATION_SOLUTION | Freq: Two times a day (BID) | RESPIRATORY_TRACT | Status: DC
  Filled 2018-04-09: qty 2

## 2018-04-09 MED ORDER — LORATADINE 10 MG PO TABS
10.0000 mg | ORAL_TABLET | Freq: Every day | ORAL | Status: DC
  Administered 2018-04-10: 10 mg via ORAL
  Filled 2018-04-09: qty 1

## 2018-04-09 MED ORDER — HEPARIN SODIUM (PORCINE) 1000 UNIT/ML IJ SOLN
INTRAMUSCULAR | Status: DC | PRN
  Administered 2018-04-09: 6000 [IU] via INTRAVENOUS

## 2018-04-09 MED ORDER — PHENOL 1.4 % MT LIQD: 1.0000 | OROMUCOSAL | Status: DC | PRN

## 2018-04-09 MED ORDER — FENTANYL CITRATE (PF) 100 MCG/2ML IJ SOLN
INTRAMUSCULAR | Status: DC | PRN
  Administered 2018-04-09: 150 ug via INTRAVENOUS
  Administered 2018-04-09 (×2): 50 ug via INTRAVENOUS

## 2018-04-09 MED ORDER — CEFAZOLIN SODIUM-DEXTROSE 2-4 GM/100ML-% IV SOLN
2.0000 g | Freq: Three times a day (TID) | INTRAVENOUS | Status: AC
  Administered 2018-04-10 (×2): 2 g via INTRAVENOUS
  Filled 2018-04-09 (×3): qty 100

## 2018-04-09 MED ORDER — SODIUM CHLORIDE 0.9 % IV SOLN
INTRAVENOUS | Status: DC | PRN
  Administered 2018-04-09: 500 mL

## 2018-04-09 MED ORDER — PROTAMINE SULFATE 10 MG/ML IV SOLN
INTRAVENOUS | Status: DC | PRN
  Administered 2018-04-09: 25 mg via INTRAVENOUS

## 2018-04-09 MED ORDER — HEPARIN SODIUM (PORCINE) 1000 UNIT/ML IJ SOLN
INTRAMUSCULAR | Status: AC
  Filled 2018-04-09: qty 1

## 2018-04-09 MED ORDER — ONDANSETRON HCL 4 MG/2ML IJ SOLN: 4.0000 mg | Freq: Four times a day (QID) | INTRAMUSCULAR | Status: DC | PRN

## 2018-04-09 MED ORDER — FENTANYL CITRATE (PF) 250 MCG/5ML IJ SOLN
INTRAMUSCULAR | Status: AC
  Filled 2018-04-09: qty 5

## 2018-04-09 MED ORDER — HEMOSTATIC AGENTS (NO CHARGE) OPTIME
TOPICAL | Status: DC | PRN
  Administered 2018-04-09: 1 via TOPICAL

## 2018-04-09 MED ORDER — ATORVASTATIN CALCIUM 10 MG PO TABS: 10.0000 mg | ORAL_TABLET | Freq: Every day | ORAL | Status: DC

## 2018-04-09 MED ORDER — UMECLIDINIUM BROMIDE 62.5 MCG/INH IN AEPB
1.0000 | INHALATION_SPRAY | Freq: Every day | RESPIRATORY_TRACT | Status: DC
  Filled 2018-04-09: qty 7

## 2018-04-09 MED ORDER — SODIUM CHLORIDE 0.9 % IV SOLN
INTRAVENOUS | Status: DC
  Administered 2018-04-09: 17:00:00 via INTRAVENOUS

## 2018-04-09 MED ORDER — POLYETHYLENE GLYCOL 3350 17 G PO PACK: 17.0000 g | PACK | Freq: Every day | ORAL | Status: DC | PRN

## 2018-04-09 MED ORDER — OXYCODONE-ACETAMINOPHEN 5-325 MG PO TABS
1.0000 | ORAL_TABLET | Freq: Four times a day (QID) | ORAL | Status: DC | PRN
  Administered 2018-04-10 (×2): 2 via ORAL
  Filled 2018-04-09 (×2): qty 2

## 2018-04-09 MED ORDER — FENTANYL CITRATE (PF) 100 MCG/2ML IJ SOLN
INTRAMUSCULAR | Status: AC
  Filled 2018-04-09: qty 2

## 2018-04-09 MED ORDER — ADULT MULTIVITAMIN W/MINERALS CH: 1.0000 | ORAL_TABLET | Freq: Every evening | ORAL | Status: DC

## 2018-04-09 MED ORDER — ALBUTEROL SULFATE HFA 108 (90 BASE) MCG/ACT IN AERS: 2.0000 | INHALATION_SPRAY | RESPIRATORY_TRACT | Status: DC | PRN

## 2018-04-09 MED ORDER — ALBUTEROL SULFATE (2.5 MG/3ML) 0.083% IN NEBU: 2.5000 mg | INHALATION_SOLUTION | RESPIRATORY_TRACT | Status: DC | PRN

## 2018-04-09 MED ORDER — HYDROMORPHONE HCL 1 MG/ML IJ SOLN
INTRAMUSCULAR | Status: AC
  Filled 2018-04-09: qty 1

## 2018-04-09 MED ORDER — HEPARIN BOLUS VIA INFUSION
3000.0000 [IU] | Freq: Once | INTRAVENOUS | Status: AC
  Administered 2018-04-09: 3000 [IU] via INTRAVENOUS
  Filled 2018-04-09: qty 3000

## 2018-04-09 MED ORDER — AMLODIPINE BESYLATE 5 MG PO TABS
5.0000 mg | ORAL_TABLET | Freq: Every day | ORAL | Status: DC
Start: 2018-04-09 — End: 2018-04-10
  Administered 2018-04-09: 5 mg via ORAL
  Filled 2018-04-09: qty 1

## 2018-04-09 MED ORDER — HYDROMORPHONE HCL 1 MG/ML IJ SOLN
0.5000 mg | INTRAMUSCULAR | Status: DC | PRN
  Administered 2018-04-09 – 2018-04-10 (×2): 0.5 mg via INTRAVENOUS
  Filled 2018-04-09: qty 1

## 2018-04-09 MED ORDER — GABAPENTIN 300 MG PO CAPS: 300.0000 mg | ORAL_CAPSULE | Freq: Every evening | ORAL | Status: DC | PRN

## 2018-04-09 MED ORDER — ONDANSETRON HCL 4 MG/2ML IJ SOLN
INTRAMUSCULAR | Status: DC | PRN
  Administered 2018-04-09: 4 mg via INTRAVENOUS

## 2018-04-09 MED ORDER — MOMETASONE FURO-FORMOTEROL FUM 200-5 MCG/ACT IN AERO
2.0000 | INHALATION_SPRAY | Freq: Two times a day (BID) | RESPIRATORY_TRACT | Status: DC
  Filled 2018-04-09: qty 8.8

## 2018-04-09 MED ORDER — METOPROLOL TARTRATE 5 MG/5ML IV SOLN: 2.0000 mg | INTRAVENOUS | Status: DC | PRN

## 2018-04-09 MED ORDER — ACETAMINOPHEN 325 MG RE SUPP: 325.0000 mg | RECTAL | Status: DC | PRN

## 2018-04-09 MED ORDER — BISACODYL 10 MG RE SUPP: 10.0000 mg | Freq: Every day | RECTAL | Status: DC | PRN

## 2018-04-09 MED ORDER — HEPARIN (PORCINE) IN NACL 100-0.45 UNIT/ML-% IJ SOLN
800.0000 [IU]/h | INTRAMUSCULAR | Status: DC
  Administered 2018-04-09: 800 [IU]/h via INTRAVENOUS
  Filled 2018-04-09: qty 250

## 2018-04-09 MED ORDER — CLOPIDOGREL BISULFATE 75 MG PO TABS
75.0000 mg | ORAL_TABLET | Freq: Every day | ORAL | Status: DC
  Administered 2018-04-09: 75 mg via ORAL
  Filled 2018-04-09: qty 1

## 2018-04-09 MED ORDER — LABETALOL HCL 5 MG/ML IV SOLN: 10.0000 mg | INTRAVENOUS | Status: DC | PRN

## 2018-04-09 MED ORDER — HEPARIN SODIUM (PORCINE) 5000 UNIT/ML IJ SOLN: 5000.0000 [IU] | Freq: Three times a day (TID) | INTRAMUSCULAR | Status: DC

## 2018-04-09 MED ORDER — LACTATED RINGERS IV SOLN
INTRAVENOUS | Status: DC | PRN
  Administered 2018-04-09: 20:00:00 via INTRAVENOUS

## 2018-04-09 MED ORDER — IODIXANOL 320 MG/ML IV SOLN
INTRAVENOUS | Status: DC | PRN
  Administered 2018-04-09: 15 mL via INTRAMUSCULAR

## 2018-04-09 MED ORDER — SODIUM CHLORIDE 0.9 % IV SOLN: 500.0000 mL | Freq: Once | INTRAVENOUS | Status: DC | PRN

## 2018-04-09 MED ORDER — HYDRALAZINE HCL 20 MG/ML IJ SOLN: 5.0000 mg | INTRAMUSCULAR | Status: DC | PRN

## 2018-04-09 MED ORDER — PROTAMINE SULFATE 10 MG/ML IV SOLN
INTRAVENOUS | Status: AC
  Filled 2018-04-09: qty 10

## 2018-04-09 MED ORDER — TIOTROPIUM BROMIDE-OLODATEROL 2.5-2.5 MCG/ACT IN AERS: INHALATION_SPRAY | Freq: Every day | RESPIRATORY_TRACT | Status: DC

## 2018-04-09 MED ORDER — ACETAMINOPHEN 325 MG PO TABS: 325.0000 mg | ORAL_TABLET | ORAL | Status: DC | PRN

## 2018-04-09 MED ORDER — PHENYLEPHRINE HCL 10 MG/ML IJ SOLN
INTRAVENOUS | Status: DC | PRN
  Administered 2018-04-09: 10 ug/min via INTRAVENOUS

## 2018-04-09 MED ORDER — FENTANYL CITRATE (PF) 100 MCG/2ML IJ SOLN
25.0000 ug | INTRAMUSCULAR | Status: DC | PRN
  Administered 2018-04-09: 25 ug via INTRAVENOUS

## 2018-04-09 MED ORDER — SUGAMMADEX SODIUM 200 MG/2ML IV SOLN
INTRAVENOUS | Status: DC | PRN
  Administered 2018-04-09: 100 mg via INTRAVENOUS

## 2018-04-09 MED ORDER — PROPOFOL 10 MG/ML IV BOLUS
INTRAVENOUS | Status: DC | PRN
  Administered 2018-04-09: 100 mg via INTRAVENOUS

## 2018-04-09 MED ORDER — ALUM & MAG HYDROXIDE-SIMETH 200-200-20 MG/5ML PO SUSP: 15.0000 mL | ORAL | Status: DC | PRN

## 2018-04-09 MED ORDER — PANTOPRAZOLE SODIUM 40 MG PO TBEC
40.0000 mg | DELAYED_RELEASE_TABLET | Freq: Every day | ORAL | Status: DC
  Administered 2018-04-10: 40 mg via ORAL
  Filled 2018-04-09: qty 1

## 2018-04-09 MED ORDER — MORPHINE SULFATE (PF) 2 MG/ML IV SOLN: 1.0000 mg | INTRAVENOUS | Status: DC | PRN

## 2018-04-09 SURGICAL SUPPLY — 52 items
BAG BANDED W/RUBBER/TAPE 36X54 (MISCELLANEOUS) ×3 IMPLANT
CANISTER SUCT 3000ML PPV (MISCELLANEOUS) ×3 IMPLANT
CATH EMB 3FR 80CM (CATHETERS) ×3 IMPLANT
CATH EMB 4FR 80CM (CATHETERS) IMPLANT
CATH EMB 5FR 80CM (CATHETERS) IMPLANT
CLIP VESOCCLUDE MED 24/CT (CLIP) ×3 IMPLANT
CLIP VESOCCLUDE SM WIDE 24/CT (CLIP) ×3 IMPLANT
DERMABOND ADVANCED (GAUZE/BANDAGES/DRESSINGS) ×2
DERMABOND ADVANCED .7 DNX12 (GAUZE/BANDAGES/DRESSINGS) ×1 IMPLANT
DRAIN CHANNEL 15F RND FF W/TCR (WOUND CARE) IMPLANT
ELECT REM PT RETURN 9FT ADLT (ELECTROSURGICAL) ×3
ELECTRODE REM PT RTRN 9FT ADLT (ELECTROSURGICAL) ×1 IMPLANT
EVACUATOR SILICONE 100CC (DRAIN) IMPLANT
GLOVE BIO SURGEON STRL SZ7.5 (GLOVE) ×3 IMPLANT
GLOVE BIOGEL PI IND STRL 6.5 (GLOVE) ×3 IMPLANT
GLOVE BIOGEL PI IND STRL 7.5 (GLOVE) ×1 IMPLANT
GLOVE BIOGEL PI INDICATOR 6.5 (GLOVE) ×6
GLOVE BIOGEL PI INDICATOR 7.5 (GLOVE) ×2
GOWN STRL REUS W/ TWL LRG LVL3 (GOWN DISPOSABLE) ×4 IMPLANT
GOWN STRL REUS W/ TWL XL LVL3 (GOWN DISPOSABLE) ×1 IMPLANT
GOWN STRL REUS W/TWL LRG LVL3 (GOWN DISPOSABLE) ×8
GOWN STRL REUS W/TWL XL LVL3 (GOWN DISPOSABLE) ×2
HEMOSTAT SNOW SURGICEL 2X4 (HEMOSTASIS) IMPLANT
KIT BASIN OR (CUSTOM PROCEDURE TRAY) ×3 IMPLANT
KIT TURNOVER KIT B (KITS) ×3 IMPLANT
NS IRRIG 1000ML POUR BTL (IV SOLUTION) ×6 IMPLANT
PACK PERIPHERAL VASCULAR (CUSTOM PROCEDURE TRAY) ×3 IMPLANT
PAD ARMBOARD 7.5X6 YLW CONV (MISCELLANEOUS) ×6 IMPLANT
SET COLLECT BLD 21X3/4 12 (NEEDLE) IMPLANT
SET MICROPUNCTURE 5F STIFF (MISCELLANEOUS) ×6 IMPLANT
STOPCOCK 4 WAY LG BORE MALE ST (IV SETS) IMPLANT
STRIP PERIGUARD 6X8 (Vascular Products) ×3 IMPLANT
SUT ETHILON 3 0 PS 1 (SUTURE) IMPLANT
SUT MNCRL AB 4-0 PS2 18 (SUTURE) ×3 IMPLANT
SUT PROLENE 5 0 C 1 24 (SUTURE) ×6 IMPLANT
SUT PROLENE 6 0 BV (SUTURE) ×3 IMPLANT
SUT SILK 3 0 (SUTURE)
SUT SILK 3-0 18XBRD TIE 12 (SUTURE) IMPLANT
SUT VIC AB 2-0 CT1 27 (SUTURE) ×2
SUT VIC AB 2-0 CT1 TAPERPNT 27 (SUTURE) ×1 IMPLANT
SUT VIC AB 3-0 SH 27 (SUTURE) ×2
SUT VIC AB 3-0 SH 27X BRD (SUTURE) ×1 IMPLANT
SYR 3ML LL SCALE MARK (SYRINGE) ×6 IMPLANT
SYRINGE 10CC LL (SYRINGE) ×6 IMPLANT
TOWEL GREEN STERILE (TOWEL DISPOSABLE) ×3 IMPLANT
TRAY FOLEY MTR SLVR 16FR STAT (SET/KITS/TRAYS/PACK) ×3 IMPLANT
TUBE CONNECTING 12'X1/4 (SUCTIONS) ×1
TUBE CONNECTING 12X1/4 (SUCTIONS) ×2 IMPLANT
TUBING CIL FLEX 10 FLL-RA (TUBING) ×3 IMPLANT
TUBING EXTENTION W/L.L. (IV SETS) IMPLANT
UNDERPAD 30X30 (UNDERPADS AND DIAPERS) ×3 IMPLANT
WATER STERILE IRR 1000ML POUR (IV SOLUTION) ×3 IMPLANT

## 2018-04-09 NOTE — ED Notes (Signed)
Vascular at bedside

## 2018-04-09 NOTE — Telephone Encounter (Signed)
Monitor for ED arrival. 

## 2018-04-09 NOTE — Anesthesia Postprocedure Evaluation (Signed)
Anesthesia Post Note  Patient: [name]  Procedure(s) Performed: RIGHT COMMON FEMORAL ENDARTERECTOMY  WITH BOVINE PATCH ANGIOPLASTY; RIGHT LOWER LEG THROMBOEMBOLECTOMY INTRAOPERATIVE ARTERIOGRAM (Right )     Patient location during evaluation: PACU Anesthesia Type: General Level of consciousness: awake and alert Pain management: pain level controlled Vital Signs Assessment: post-procedure vital signs reviewed and stable Respiratory status: spontaneous breathing, nonlabored ventilation, respiratory function stable and patient connected to nasal cannula oxygen Cardiovascular status: blood pressure returned to baseline and stable Postop Assessment: no apparent nausea or vomiting Anesthetic complications: no    Last Vitals:  Vitals:   04/09/18 2050 04/09/18 2052  BP: (!) 157/63 (!) 157/63  Pulse: 76 75  Resp: 19 17  Temp: 36.9 C 37.1 C  SpO2: 98% 98%    Last Pain:  Vitals:   04/09/18 2052  TempSrc: Oral  PainSc: 7                  Catalina Gravel

## 2018-04-09 NOTE — Anesthesia Preprocedure Evaluation (Addendum)
Anesthesia Evaluation  Patient identified by MRN, date of birth, ID band Patient awake    Reviewed: Allergy & Precautions, NPO status , Patient's Chart, lab work & pertinent test results  Airway Mallampati: I  TM Distance: >3 FB Neck ROM: Full    Dental  (+) Edentulous Upper, Edentulous Lower, Dental Advisory Given   Pulmonary COPD, former smoker,    Pulmonary exam normal        Cardiovascular hypertension, Pt. on medications Normal cardiovascular exam     Neuro/Psych    GI/Hepatic   Endo/Other    Renal/GU      Musculoskeletal   Abdominal   Peds  Hematology   Anesthesia Other Findings   Reproductive/Obstetrics                            Anesthesia Physical Anesthesia Plan  ASA: III  Anesthesia Plan: General   Post-op Pain Management:    Induction: Intravenous  PONV Risk Score and Plan: 3 and Ondansetron, Midazolam and Treatment may vary due to age or medical condition  Airway Management Planned: Oral ETT  Additional Equipment:   Intra-op Plan:   Post-operative Plan: Extubation in OR  Informed Consent: I have reviewed the patients History and Physical, chart, labs and discussed the procedure including the risks, benefits and alternatives for the proposed anesthesia with the patient or authorized representative who has indicated his/her understanding and acceptance.     Plan Discussed with: CRNA and Surgeon  Anesthesia Plan Comments:         Anesthesia Quick Evaluation

## 2018-04-09 NOTE — ED Notes (Signed)
Vascular at the bedside. 

## 2018-04-09 NOTE — ED Provider Notes (Signed)
[facility] EMERGENCY DEPARTMENT Provider Note   CSN: [id number] Arrival date & time: 04/09/18  1254     History   Chief Complaint Chief Complaint  Patient presents with  . Leg Pain    HPI Carla Dawson is a 84 y.o. female.   The history is provided by the patient.   Leg Pain    This is a new problem. The current episode started yesterday. The problem occurs constantly. The problem has not changed since onset.The pain is present in the right lower leg. The quality of the pain is described as aching. The pain is at a severity of 6/10. The pain is moderate. Pertinent negatives include no numbness, full range of motion, no stiffness, no tingling and no itching. The symptoms are aggravated by contact. She has tried nothing for the symptoms. The treatment provided no relief. There has been no history of extremity trauma. Family history is significant for no rheumatoid arthritis and no gout.  PVD with stent placed to left femoral artery two days ago with vascular surgery, hx of DVT possible in RLE    Past Medical History:  Diagnosis Date  . Allergy    "maybe seasonal allergies" (07/08/2017)  . Arthritis    "hands" (07/08/2017)  . Chicken pox   . COPD (chronic obstructive pulmonary disease) ([place])   . High cholesterol    "took me off pills ~ 1-2 months ago" (07/08/2017)  . History of blood transfusion 2017   "when I had blood clot in my leg"  . Hypertension   . PAD (peripheral artery disease) ([place])    severe/notes 10/14/2016  . Pneumonia ~ 2015  . PVD (peripheral vascular disease) [facility])     Patient Active Problem List   Diagnosis Date Noted  . COPD with acute exacerbation ([place]) 03/23/2018  . Hyperlipidemia 02/24/2018  . Abnormal CXR 10/26/2016  . COPD  GOLD III  10/07/2016  . Essential hypertension, benign 10/07/2016  . PAD (peripheral artery disease) ([place]) 10/07/2016  . Neuropathy due to peripheral vascular disease ([place]) 10/07/2016    Past  Surgical History:  Procedure Laterality Date  . ABDOMINAL AORTOGRAM N/A 04/22/2017   Procedure: Abdominal Aortogram;  Surgeon: Serafina Mitchell, MD;  Location: [facility] CV LAB;  Service: Cardiovascular;  Laterality: N/A;  . ABDOMINAL AORTOGRAM W/LOWER EXTREMITY N/A 03/04/2017   Procedure: Abdominal Aortogram w/Lower Extremity;  Surgeon: Serafina Mitchell, MD;  Location: [facility] CV LAB;  Service: Cardiovascular;  Laterality: N/A;  . ABDOMINAL AORTOGRAM W/LOWER EXTREMITY N/A 07/08/2017   Procedure: Abdominal Aortogram w/Lower Extremity;  Surgeon: Serafina Mitchell, MD;  Location: [facility] CV LAB;  Service: Cardiovascular;  Laterality: N/A;  . BIOPSY THYROID    . CATARACT EXTRACTION W/ INTRAOCULAR LENS  IMPLANT, BILATERAL Bilateral   . HEMATOMA EVACUATION Right 2017   "S/P procedure; discharged; had to come back for emergency OR"  . LOWER EXTREMITY ANGIOGRAPHY Bilateral 04/22/2017   Procedure: Lower Extremity Angiography;  Surgeon: Serafina Mitchell, MD;  Location: [facility] CV LAB;  Service: Cardiovascular;  Laterality: Bilateral;  . LOWER EXTREMITY ANGIOGRAPHY Bilateral 04/07/2018   Procedure: LOWER EXTREMITY ANGIOGRAPHY;  Surgeon: Serafina Mitchell, MD;  Location: [facility] CV LAB;  Service: Cardiovascular;  Laterality: Bilateral;  . PERIPHERAL VASCULAR BALLOON ANGIOPLASTY Left 03/04/2017   Procedure: Peripheral Vascular Balloon Angioplasty;  Surgeon: Serafina Mitchell, MD;  Location: [facility] CV LAB;  Service: Cardiovascular;  Laterality: Left;  SFA and PT  . PERIPHERAL VASCULAR INTERVENTION  07/08/2017  . PERIPHERAL VASCULAR INTERVENTION  07/08/2017   Procedure: Peripheral Vascular Intervention;  Surgeon: Serafina Mitchell, MD;  Location: [facility] CV LAB;  Service: Cardiovascular;;  . PERIPHERAL VASCULAR INTERVENTION Left 04/07/2018   Procedure: PERIPHERAL VASCULAR INTERVENTION;  Surgeon: Serafina Mitchell, MD;  Location: [facility] CV LAB;  Service: Cardiovascular;  Laterality: Left;  Marland Kitchen  VAGINAL HYSTERECTOMY    . VEIN BYPASS SURGERY       OB History   None      Home Medications    Prior to Admission medications   Medication Sig Start Date End Date Taking? Authorizing Provider  albuterol (PROVENTIL HFA;VENTOLIN HFA) 108 (90 Base) MCG/ACT inhaler INHALE 2 PUFFS BY MOUTH EVERY 4 HOURS AS NEEDED FOR WHEEZING 03/20/18   Tanda Rockers, MD  albuterol (PROVENTIL) (2.5 MG/3ML) 0.083% nebulizer solution Take 3 mLs (2.5 mg total) by nebulization every 4 (four) hours as needed for wheezing or shortness of breath. 06/04/17   Tanda Rockers, MD  amLODipine (NORVASC) 5 MG tablet Take 1 tablet (5 mg total) by mouth at bedtime. 02/24/18   Martinique, Betty G, MD  atorvastatin (LIPITOR) 10 MG tablet Take 1 tablet (10 mg total) by mouth daily. 02/24/18   Martinique, Betty G, MD  budesonide-formoterol [facility]) 160-4.5 MCG/ACT inhaler Inhale 2 puffs into the lungs 2 (two) times daily. 04/03/18   Tanda Rockers, MD  cetirizine (ZYRTEC) 10 MG tablet Take 10 mg by mouth daily.     [provider]  clopidogrel (PLAVIX) 75 MG tablet TAKE 1 TABLET(75 MG) BY MOUTH DAILY 02/24/18   Martinique, Betty G, MD  gabapentin (NEURONTIN) 300 MG capsule Take 1 capsule (300 mg total) by mouth at bedtime. Patient taking differently: Take 300 mg by mouth at bedtime as needed (for pain).  06/20/17   Martinique, Betty G, MD  Multiple Vitamin (MULTIVITAMIN WITH MINERALS) TABS tablet Take 1 tablet by mouth every evening.     [provider]  predniSONE (DELTASONE) 10 MG tablet Take  2 daily until better then 1 daily x 3 days and stop 04/03/18   Tanda Rockers, MD  Probiotic Product (SOLUBLE FIBER/PROBIOTICS) CHEW Chew 1 each by mouth daily.     [provider]  Tiotropium Bromide Monohydrate (SPIRIVA RESPIMAT) 2.5 MCG/ACT AERS Inhale 2 puffs into the lungs daily. 04/03/18   Tanda Rockers, MD    Family History Family History  Problem Relation Age of Onset  . Arthritis Mother   . Hypertension Mother     . Arthritis Father   . Hypertension Father   . Cancer Brother   . Hypertension Son     Social History Social History   Tobacco Use  . Smoking status: Former Smoker    Packs/day: 1.50    Years: 52.00    Pack years: 78.00    Start date: 12/09/1953    Last attempt to quit: 12/09/2005    Years since quitting: 12.3  . Smokeless tobacco: Never Used  Substance Use Topics  . Alcohol use: No  . Drug use: No     Allergies   Lortab [hydrocodone-acetaminophen]   Review of Systems Review of Systems  Constitutional: Negative for chills and fever.  HENT: Negative for ear pain and sore throat.   Eyes: Negative for pain and visual disturbance.  Respiratory: Negative for cough and shortness of breath.   Cardiovascular: Negative for chest pain and palpitations.  Gastrointestinal: Negative for abdominal pain and vomiting.  Genitourinary: Negative for dysuria  and hematuria.  Musculoskeletal: Negative for arthralgias, back pain and stiffness.  Skin: Negative for color change, itching and rash.  Neurological: Negative for tingling, seizures, syncope and numbness.  All other systems reviewed and are negative.    Physical Exam Updated Vital Signs  ED Triage Vitals  Enc Vitals Group     BP 04/09/18 1259 (!) 133/98     Pulse Rate 04/09/18 1259 82     Resp 04/09/18 1259 20     Temp 04/09/18 1259 98.5 F (36.9 C)     Temp Source 04/09/18 1259 Oral     SpO2 04/09/18 1259 95 %     Weight 04/09/18 1445 110 lb (49.9 kg)     Height 04/09/18 1445 5\' 1"  (1.549 m)     Head Circumference --      Peak Flow --      Pain Score 04/09/18 1304 10     Pain Loc --      Pain Edu? --      Excl. in [place]? --     Physical Exam  Constitutional: She is oriented to person, place, and time. She appears well-developed and well-nourished. No distress.  HENT:  Head: Normocephalic and atraumatic.  Eyes: Pupils are equal, round, and reactive to light. Conjunctivae and EOM are normal.  Neck: Normal range of  motion. Neck supple.  Cardiovascular: Normal rate, regular rhythm and normal heart sounds.  No murmur heard. 2+ PT and DP pulses on the left side and unable to palpate pulses manually on the right lower extremity but patient with monophasic DP pulse and PT pulse with Doppler  Pulmonary/Chest: Effort normal and breath sounds normal. No respiratory distress.  Abdominal: Soft. Bowel sounds are normal. There is no tenderness.  Musculoskeletal: Normal range of motion. She exhibits no edema or tenderness.  Neurological: She is alert and oriented to person, place, and time.  Skin: Skin is warm and dry.  No swelling or redness of right lower extremity or LLE, right lower foot in cold compared to left  Psychiatric: She has a normal mood and affect.  Nursing note and vitals reviewed.    ED Treatments / Results  Labs (all labs ordered are listed, but only abnormal results are displayed) Labs Reviewed  CBC WITH DIFFERENTIAL/PLATELET - Abnormal; Notable for the following components:      Result Value   WBC 14.2 (*)    Neutro Abs 10.6 (*)    Monocytes Absolute 1.8 (*)    All other components within normal limits  COMPREHENSIVE METABOLIC PANEL - Abnormal; Notable for the following components:   Glucose, Bld 123 (*)    Calcium 8.8 (*)    Total Protein 5.9 (*)    Albumin 3.3 (*)    ALT 11 (*)    All other components within normal limits  I-STAT CG4 LACTIC ACID, ED    EKG None  Radiology No results found.  Procedures Procedures (including critical care time)  Medications Ordered in ED Medications  ceFAZolin (ANCEF) IVPB 1 g/50 mL premix (has no administration in time range)     Initial Impression / Assessment and Plan / ED Course  I have reviewed the triage vital signs and the nursing notes.  Pertinent labs & imaging results that were available during my care of the patient were reviewed by me and considered in my medical decision making (see chart for details).  Clinical Course  as of Apr 09 1536  Thu Apr 09, 2018  1416 Spoke with  Clarene Critchley, nurse answering for Dr. Donzetta Matters, on call for vascular surgery. States Dr. Donzetta Matters is currently in the OR.  Called to make Dr. Donzetta Matters aware of patient's presentation to the ED 48 hours after her procedure with Dr. Trula Slade. States she will make Dr. Donzetta Matters aware.    [SJ]    Clinical Course User Index [SJ] Lorayne Bender, PA-C    Carla Dawson is an 84 year old female w/ history of peripheral vascular disease status post stent placement to left superficial femoral and popliteal arteries about 2 days ago who presents to the ED with right lower extremity pain.  Patient with unremarkable vitals.  No fever.  Patient began having right lower extremity pain since yesterday.  Has a history of possible DVT in the right lower extremity.  Recently had surgery with vascular surgery for arterial occlusion in the left lower extremity 2 days ago in which they placed a stent.  The right lower extremity was not evaluated at that time.  Patient has no palpable pulses manually in the right lower extremity but has monophasic pulse in the DP and PT with Doppler.  Patient has 2+ pulses in the left lower extremity.  Patient has cold right lower extremity compared to left lower extremity.  No signs of DVT or cellulitis on exam. No hx of afib. Patient had basic labs done prior to my evaluation that were overall unremarkable.  No significant anemia, electrolyte abnormality, acute kidney injury.  Patient with DVT study that did not show any clot in the right lower extremity.  Vascular surgery was consulted and came down to the ED to evaluate the patient.  Dr. Donzetta Matters with vascular surgery requests heparin bolus and IV drip and patient to be taken to the OR for likely stent placement.  Patient was hemodynamically stable throughout my care and transferred to the OR in stable condition.  Final Clinical Impressions(s) / ED Diagnoses   Final diagnoses:  Peripheral artery  occlusion [facility])    ED Discharge Orders    None      Lennice Sites, DO 04/09/18 1537  Sherwood Gambler, MD 04/10/18 0001

## 2018-04-09 NOTE — Op Note (Signed)
Patient name: Carla Dawson MRN: [id number] DOB: 08-28-1931 Sex: female  04/09/2018 Pre-operative Diagnosis: Acute right lower extremity ischemia Post-operative diagnosis:  Same Surgeon:  Erlene Quan C. Donzetta Matters, MD Assistants: [name], MD Leontine Locket, [place] Procedure Performed: 1.  Re-exposure right common femoral artery greater than 30 days  2.  Right common femoral endarterectomy with bovine pericardial patch angioplasty 3.  Right lower extremity embolectomy 4.  Right lower extremity angiogram   Indications: 84 year old female with a history of multiple bilateral lower extremity interventions.  Of interest to this procedure she has had a previous right femoral to tibioperoneal trunk bypass with vein that was a salvage procedure after failed endovascular revascularization performed at an outside facility in late 2016.  Most recently she underwent stenting of her left lower extremity.  She now has coolness to her right foot and some sensation changes with very weak signals at the level of the ankle relative to the left and concerned that there is thrombosis possibly related to her recent intervention.  Findings: There was significant scar tissue in the right groin requiring Doppler to identify the very weak signal in the common femoral artery itself.  The bypass graft was anastomosed to the common femoral artery at the level of the profunda femoris takeoff.  The bypass graft itself did not have any thrombus within it.  The previously placed minx device was on the cephalad aspect of the common femoral artery and upon removing there was bleeding.  Part of minx device appeared to be within the artery and there was significant soft plaque causing thrombosis and a very focal area of the common femoral artery.  There was significant soft plaque and narrowing throughout the common femoral artery.  After endarterectomy and patch angioplasty there was a strong pulse within the artery and there was  a palpable pulse distally at the level of the popliteal and a strong peroneal signal.  The right lower extremity angiogram demonstrated flow through the bypass proximally as well as into the profunda femoris artery with significantly improved diameter of the common femoral artery and flow to the level of the ankle via the peroneal artery consistent with her previous angiograms.   Procedure:  The patient was identified in the holding area and taken to the operating room where she was placed supine on the operating table and general anesthesia was induced.  She was sterilely prepped and draped in the right lower extremity in the usual fashion, given antibiotics and timeout was called.  Following this we began with a longitudinal incision overlying the previous stick site in the right groin.  This was initially above any previous incision but connected in a curvilinear fashion to her previous incision just below.  We tediously dissected through significant scar tissue down to the level of the common femoral artery using Doppler to identify the artery given that there was no pulsatility within it.  We able to identify some clips and Prolene suture and dissected out the common femoral artery up to the level of the inguinal ligament.  We then dissected down on the profunda were able to place a vessel loop around this as well as then the bypass graft and get a vessel loop around this as well.  We then dissected cephalad up to the level of the inguinal ligament where we encountered the minx device and when this was removed there was bleeding that was repaired with a 5-0 Prolene suture.  We then dissected higher than this placing Vesseloops  up onto the inguinal ligament.  Patient at this time was given a full-strength dose of heparin having been on a heparin drip on arrival to the operating room.  We then opened from the bypass graft good longitudinally on the common femoral artery where we encountered significant soft  plaque in webbing but no frank thrombus.  We performed endarterectomy starting from the patch including the profunda and back onto the common femoral artery.  We extended our arteriotomy up to the level of the previous cannulation site and did find some thrombus within the common femoral artery cephalad and appeared to be part of may be the minx device as well.  Having performed common femoral endarterectomy we then were able to get significant soft plaque from the external and remove this and had very strong inflow.  We passed a 3 Fogarty both proximally and distally only return to clot from the proximal aspect and was quite minimal but nothing from distally.  Fogarty was sent all the way to the level of the foot.  A bovine patch was then trimmed to size and sewn in place with 5 and Prolene suture.  Prior to completion we allowed flushing techniques in all directions.  After completing we did have palpable pulsatility in the proximal graft we palpated at the level of the popliteal as well and we had a strong peroneal signal that disappeared with compression of the vein graft.  We then cannulated the patch with micropuncture needle and sheath and performed right lower extremity angiogram which demonstrated patent graft and a patent common femoral endarterectomy with flow into the profunda.  Satisfied with this patient was given 25 mg of protamine.  The micro sheath was removed and the area suture ligated with 6-0 Prolene figure-of-eight suture.  We then obtained hemostasis in the wound and closed in layers with Vicryl and Monocryl suture.  Dermabond was placed at the level of the skin.  She was allowed away from anesthesia having tolerated procedure without immediate comp occasion.  EBL 100 cc.  Iolanda Folson C. Donzetta Matters, MD [facility] Office: [phone] Pager: [phone]

## 2018-04-09 NOTE — Telephone Encounter (Signed)
Pt's daughter called to report pt with "Considerable" calf pain right leg with ambulation, onset yesterday. Pt  S/P peripheral artery angioplasty with stent placement RLE on Tuesday 04/07/18.  Denies redness, swelling, warmth. Daughter states pt has h/o DVT.  Pt called vascular practice who recommended ED with call to PCP.  Pt directed to ED. Daughter states they will go to ED.   Reason for Disposition . [1] Thigh, calf, or ankle swelling AND [2] only 1 side  Answer Assessment - Initial Assessment Questions 1. ONSET: "When did the pain start?"      Yesterday 2. LOCATION: "Where is the pain located?"      Right calf 3. PAIN: "How bad is the pain?"    (Scale 1-10; or mild, moderate, severe)   -  MILD (1-3): doesn't interfere with normal activities    -  MODERATE (4-7): interferes with normal activities (e.g., work or school) or awakens from sleep, limping    -  SEVERE (8-10): excruciating pain, unable to do any normal activities, unable to walk     8/10, "Considerable amount" 4. WORK OR EXERCISE: "Has there been any recent work or exercise that involved this part of the body?"      S/P peripheral artery angioplasty with stent placement LLE; H/O PVD and 'clots' per daughter. 5. CAUSE: "What do you think is causing the leg pain?"     Unsure, h/o clots per daughter 38. OTHER SYMPTOMS: "Do you have any other symptoms?" (e.g., chest pain, back pain, breathing difficulty, swelling, rash, fever, numbness, weakness)     no  Protocols used: LEG PAIN-A-AH

## 2018-04-09 NOTE — Discharge Instructions (Signed)
 [facility]  Discharge instructions  Lower Extremity Bypass Surgery  Please refer to the following instruction for your post-procedure care. Your surgeon or physician assistant will discuss any changes with you.  Activity  You are encouraged to walk as much as you can. You can slowly return to normal activities during the month after your surgery. Avoid strenuous activity and heavy lifting until your doctor tells you it's OK. Avoid activities such as vacuuming or swinging a golf club. Do not drive until your doctor give the OK and you are no longer taking prescription pain medications. It is also normal to have difficulty with sleep habits, eating and bowel movement after surgery. These will go away with time.  Bathing/Showering  Shower daily after you go home. Do not soak in a bathtub, hot tub, or swim until the incision heals completely.  Incision Care  Clean your incision with mild soap and water. Shower every day. Pat the area dry with a clean towel. You do not need a bandage unless otherwise instructed. Do not apply any ointments or creams to your incision. If you have open wounds you will be instructed how to care for them or a visiting nurse may be arranged for you. If you have staples or sutures along your incision they will be removed at your post-op appointment. You may have skin glue on your incision. Do not peel it off. It will come off on its own in about one week.  Wash the groin wound with soap and water daily and pat dry. (No tub bath-only shower)  Then put a dry gauze or washcloth in the groin to keep this area dry to help prevent wound infection.  Do this daily and as needed.  Do not use Vaseline or neosporin on your incisions.  Only use soap and water on your incisions and then protect and keep dry.  Diet  Resume your normal diet. There are no special food restrictions following this procedure. A low fat/ low cholesterol diet is  recommended for all patients with vascular disease. In order to heal from your surgery, it is CRITICAL to get adequate nutrition. Your body requires vitamins, minerals, and protein. Vegetables are the best source of vitamins and minerals. Vegetables also provide the perfect balance of protein. Processed food has little nutritional value, so try to avoid this.  Medications  Resume taking all your medications unless your doctor or physician assistant tells you not to. If your incision is causing pain, you may take over-the-counter pain relievers such as acetaminophen (Tylenol). If you were prescribed a stronger pain medication, please aware these medication can cause nausea and constipation. Prevent nausea by taking the medication with a snack or meal. Avoid constipation by drinking plenty of fluids and eating foods with high amount of fiber, such as fruits, vegetables, and grains. Take Colace 100 mg (an over-the-counter stool softener) twice a day as needed for constipation.  Do not take Tylenol if you are taking prescription pain medications.  Follow Up  Our office will schedule a follow up appointment 2-3 weeks following discharge.  Please call us immediately for any of the following conditions  Severe or worsening pain in your legs or feet while at rest or while walking Increase pain, redness, warmth, or drainage (pus) from your incision site(s) Fever of 101 degree or higher The swelling in your leg with the bypass suddenly worsens and becomes more painful than when you were in the hospital If you have   been instructed to feel your graft pulse then you should do so every day. If you can no longer feel this pulse, call the office immediately. Not all patients are given this instruction.  Leg swelling is common after leg bypass surgery.  The swelling should improve over a few months following surgery. To improve the swelling, you may elevate your legs above the level of your heart while you are  sitting or resting. Your surgeon or physician assistant may ask you to apply an ACE wrap or wear compression (TED) stockings to help to reduce swelling.  Reduce your risk of vascular disease  Stop smoking. If you would like help call [facility] at 1-800-QUIT-NOW (1-800-784-8669) or [place] at 336-586-4000.  Manage your cholesterol Maintain a desired weight Control your diabetes weight Control your diabetes Keep your blood pressure down  If you have any questions, please call the office at [phone]  

## 2018-04-09 NOTE — Transfer of Care (Signed)
Immediate Anesthesia Transfer of Care Note  Patient: Carla Dawson  Procedure(s) Performed: RIGHT COMMON FEMORAL ENDARTERECTOMY  WITH BOVINE PATCH ANGIOPLASTY; RIGHT LOWER LEG THROMBOEMBOLECTOMY INTRAOPERATIVE ARTERIOGRAM (Right )  Patient Location: PACU  Anesthesia Type:General  Level of Consciousness: awake, alert  and oriented  Airway & Oxygen Therapy: Patient Spontanous Breathing and Patient connected to nasal cannula oxygen  Post-op Assessment: Report given to RN and Post -op Vital signs reviewed and stable  Post vital signs: Reviewed and stable  Last Vitals:  Vitals Value Taken Time  BP 141/57 04/09/2018  8:30 PM  Temp 36.7 C 04/09/2018  8:30 PM  Pulse 81 04/09/2018  8:32 PM  Resp 16 04/09/2018  8:32 PM  SpO2 98 % 04/09/2018  8:32 PM  Vitals shown include unvalidated device data.  Last Pain:  Vitals:   04/09/18 2030  TempSrc:   PainSc: 4          Complications: No apparent anesthesia complications

## 2018-04-09 NOTE — Anesthesia Procedure Notes (Signed)
Procedure Name: Intubation Date/Time: 04/09/2018 5:44 PM Performed by: Serafina Topham T, CRNA Pre-anesthesia Checklist: Patient identified, Emergency Drugs available, Suction available and Patient being monitored Patient Re-evaluated:Patient Re-evaluated prior to induction Oxygen Delivery Method: Circle system utilized Preoxygenation: Pre-oxygenation with 100% oxygen Induction Type: IV induction Ventilation: Mask ventilation without difficulty Laryngoscope Size: Mac and 3 Grade View: Grade I Tube type: Oral Tube size: 7.0 mm Number of attempts: 1 Airway Equipment and Method: Patient positioned with wedge pillow and Stylet Placement Confirmation: ETT inserted through vocal cords under direct vision,  positive ETCO2 and breath sounds checked- equal and bilateral Secured at: 20 cm Tube secured with: Tape Dental Injury: Teeth and Oropharynx as per pre-operative assessment

## 2018-04-09 NOTE — Telephone Encounter (Signed)
Pt in ED.  

## 2018-04-09 NOTE — Consult Note (Addendum)
Hospital Consult    Reason for Consult:  Painful right leg Requesting Physician:  ER MRN #:  [id number]  History of Present Illness: This is a 84 y.o. female who underwent an aortogram by Dr. Trula Slade on 04/07/18 with placement of drug eluding stent to left SFA and popliteal artery.  She states that her left leg feels fine.  Yesterday, she started having pain in her right calf when she was walking to the bathroom.  She took a gabapentin yesterday and felt like she may have had a little relief.  She does get relief with rest.   Her right foot is pale.  She states she has COPD but is not on oxygen at home.  Her daughter states that she tries to do a lot of walking and wears a fitbit.   She takes aspirin and Plavix.  She is on a CCB for blood pressure management.    Past Medical History:  Diagnosis Date  . Allergy    "maybe seasonal allergies" (07/08/2017)  . Arthritis    "hands" (07/08/2017)  . Chicken pox   . COPD (chronic obstructive pulmonary disease) ([place])   . High cholesterol    "took me off pills ~ 1-2 months ago" (07/08/2017)  . History of blood transfusion 2017   "when I had blood clot in my leg"  . Hypertension   . PAD (peripheral artery disease) ([place])    severe/notes 10/14/2016  . Pneumonia ~ 2015  . PVD (peripheral vascular disease) ([place])     Past Surgical History:  Procedure Laterality Date  . ABDOMINAL AORTOGRAM N/A 04/22/2017   Procedure: Abdominal Aortogram;  Surgeon: Serafina Mitchell, MD;  Location: [facility];  Service: Cardiovascular;  Laterality: N/A;  . ABDOMINAL AORTOGRAM W/LOWER EXTREMITY N/A 03/04/2017   Procedure: Abdominal Aortogram w/Lower Extremity;  Surgeon: Serafina Mitchell, MD;  Location: [facility];  Service: Cardiovascular;  Laterality: N/A;  . ABDOMINAL AORTOGRAM W/LOWER EXTREMITY N/A 07/08/2017   Procedure: Abdominal Aortogram w/Lower Extremity;  Surgeon: Serafina Mitchell, MD;  Location: [facility];  Service: Cardiovascular;   Laterality: N/A;  . BIOPSY THYROID    . CATARACT EXTRACTION W/ INTRAOCULAR LENS  IMPLANT, BILATERAL Bilateral   . HEMATOMA EVACUATION Right 2017   "S/P procedure; discharged; had to come back for emergency OR"  . LOWER EXTREMITY ANGIOGRAPHY Bilateral 04/22/2017   Procedure: Lower Extremity Angiography;  Surgeon: Serafina Mitchell, MD;  Location: [facility];  Service: Cardiovascular;  Laterality: Bilateral;  . LOWER EXTREMITY ANGIOGRAPHY Bilateral 04/07/2018   Procedure: LOWER EXTREMITY ANGIOGRAPHY;  Surgeon: Serafina Mitchell, MD;  Location: [facility];  Service: Cardiovascular;  Laterality: Bilateral;  . PERIPHERAL VASCULAR BALLOON ANGIOPLASTY Left 03/04/2017   Procedure: Peripheral Vascular Balloon Angioplasty;  Surgeon: Serafina Mitchell, MD;  Location: [facility];  Service: Cardiovascular;  Laterality: Left;  SFA and PT  . PERIPHERAL VASCULAR INTERVENTION  07/08/2017  . PERIPHERAL VASCULAR INTERVENTION  07/08/2017   Procedure: Peripheral Vascular Intervention;  Surgeon: Serafina Mitchell, MD;  Location: [facility];  Service: Cardiovascular;;  . PERIPHERAL VASCULAR INTERVENTION Left 04/07/2018   Procedure: PERIPHERAL VASCULAR INTERVENTION;  Surgeon: Serafina Mitchell, MD;  Location: [facility];  Service: Cardiovascular;  Laterality: Left;  Marland Kitchen VAGINAL HYSTERECTOMY    . VEIN BYPASS SURGERY      Allergies  Allergen Reactions  . Lortab [Hydrocodone-Acetaminophen] Shortness Of Breath, Itching and Other (See Comments)  turns real red, took benadryl     Prior to Admission medications   Medication Sig Start Date End Date Taking? Authorizing Provider  albuterol (PROVENTIL HFA;VENTOLIN HFA) 108 (90 Base) MCG/ACT inhaler INHALE 2 PUFFS BY MOUTH EVERY 4 HOURS AS NEEDED FOR WHEEZING 03/20/18   Tanda Rockers, MD  albuterol (PROVENTIL) (2.5 MG/3ML) 0.083% nebulizer solution Take 3 mLs (2.5 mg total) by nebulization every 4 (four) hours as needed for wheezing or  shortness of breath. 06/04/17   Tanda Rockers, MD  amLODipine (NORVASC) 5 MG tablet Take 1 tablet (5 mg total) by mouth at bedtime. 02/24/18   Martinique, Betty G, MD  atorvastatin (LIPITOR) 10 MG tablet Take 1 tablet (10 mg total) by mouth daily. 02/24/18   Martinique, Betty G, MD  budesonide-formoterol [facility]) 160-4.5 MCG/ACT inhaler Inhale 2 puffs into the lungs 2 (two) times daily. 04/03/18   Tanda Rockers, MD  cetirizine (ZYRTEC) 10 MG tablet Take 10 mg by mouth daily.     [provider]  clopidogrel (PLAVIX) 75 MG tablet TAKE 1 TABLET(75 MG) BY MOUTH DAILY 02/24/18   Martinique, Betty G, MD  gabapentin (NEURONTIN) 300 MG capsule Take 1 capsule (300 mg total) by mouth at bedtime. Patient taking differently: Take 300 mg by mouth at bedtime as needed (for pain).  06/20/17   Martinique, Betty G, MD  Multiple Vitamin (MULTIVITAMIN WITH MINERALS) TABS tablet Take 1 tablet by mouth every evening.     [provider]  predniSONE (DELTASONE) 10 MG tablet Take  2 daily until better then 1 daily x 3 days and stop 04/03/18   Tanda Rockers, MD  Probiotic Product (SOLUBLE FIBER/PROBIOTICS) CHEW Chew 1 each by mouth daily.     [provider]  Tiotropium Bromide Monohydrate (SPIRIVA RESPIMAT) 2.5 MCG/ACT AERS Inhale 2 puffs into the lungs daily. 04/03/18   Tanda Rockers, MD    Social History   Socioeconomic History  . Marital status: Divorced    Spouse name: Not on file  . Number of children: Not on file  . Years of education: Not on file  . Highest education level: Not on file  Occupational History  . Not on file  Social Needs  . Financial resource strain: Not on file  . Food insecurity:    Worry: Not on file    Inability: Not on file  . Transportation needs:    Medical: Not on file    Non-medical: Not on file  Tobacco Use  . Smoking status: Former Smoker    Packs/day: 1.50    Years: 52.00    Pack years: 78.00    Start date: 12/09/1953    Last attempt to quit: 12/09/2005     Years since quitting: 12.3  . Smokeless tobacco: Never Used  Substance and Sexual Activity  . Alcohol use: No  . Drug use: No  . Sexual activity: Not Currently  Lifestyle  . Physical activity:    Days per week: Not on file    Minutes per session: Not on file  . Stress: Not on file  Relationships  . Social connections:    Talks on phone: Not on file    Gets together: Not on file    Attends religious service: Not on file    Active member of club or organization: Not on file    Attends meetings of clubs or organizations: Not on file    Relationship status: Not on file  . Intimate partner violence:  Fear of current or ex partner: Not on file    Emotionally abused: Not on file    Physically abused: Not on file    Forced sexual activity: Not on file  Other Topics Concern  . Not on file  Social History Narrative  . Not on file    Family History  Problem Relation Age of Onset  . Arthritis Mother   . Hypertension Mother   . Arthritis Father   . Hypertension Father   . Cancer Brother   . Hypertension Son     ROS: [x]  Positive   [ ]  Negative   [ ]  All sytems reviewed and are negative See HPI  Physical Examination  Vitals:   04/09/18 1415 04/09/18 1448  BP: (!) 174/55 (!) 146/54  Pulse: 88 75  Resp: 16 16  Temp: 98.6 F (37 C)   SpO2: 93% 94%   Body mass index is 20.78 kg/m.  General:  WDWN in NAD Gait: Not observed HENT: WNL, normocephalic Pulmonary: normal non-labored breathing, without Rales, rhonchi,  wheezing Cardiac: regular Abdomen:  soft, NT/ND, no masses Skin: without rashes; small area of ecchymosis right groin Vascular Exam/Pulses:  Right Left  Radial 2+ (normal) Not palpated  Femoral 1+ (weak) 2+ (normal)  Popliteal Unable to palpate  Unable to palpate   DP Diminished doppler signal Brisk doppler signal  PT absent Brisk doppler signal  Peroneal Diminished doppler signal + doppler signal   Extremities: without ischemic changes, without  Gangrene , without cellulitis; without open wounds; right foot is pale; sensation/motor is in tact. Musculoskeletal: no muscle wasting or atrophy  Neurologic: A&O X 3;  No focal weakness or paresthesias are detected; speech is fluent/normal Psychiatric:  The pt has Normal affect. Lymph:  No inguinal lymphadenopathy   CBC    Component Value Date/Time   WBC 14.2 (H) 04/09/2018 1316   RBC 4.07 04/09/2018 1316   HGB 12.0 04/09/2018 1316   HCT 36.4 04/09/2018 1316   PLT 171 04/09/2018 1316   MCV 89.4 04/09/2018 1316   MCH 29.5 04/09/2018 1316   MCHC 33.0 04/09/2018 1316   RDW 13.3 04/09/2018 1316   LYMPHSABS 1.6 04/09/2018 1316   MONOABS 1.8 (H) 04/09/2018 1316   EOSABS 0.2 04/09/2018 1316   BASOSABS 0.0 04/09/2018 1316    BMET    Component Value Date/Time   NA 136 04/09/2018 1316   NA 141 04/23/2013   K 3.5 04/09/2018 1316   CL 101 04/09/2018 1316   CO2 29 04/09/2018 1316   GLUCOSE 123 (H) 04/09/2018 1316   BUN 14 04/09/2018 1316   BUN 13 04/23/2013   CREATININE 0.78 04/09/2018 1316   CREATININE 0.76 06/20/2017 1534   CALCIUM 8.8 (L) 04/09/2018 1316   GFRNONAA >60 04/09/2018 1316   GFRAA >60 04/09/2018 1316    COAGS: No results found for: INR, PROTIME   Non-Invasive Vascular Imaging:   Venous duplex negative for DVT right leg 04/09/18  Statin:  Yes.   Beta Blocker:  No. Aspirin:  Yes.   ACEI:  No. ARB:  No. CCB use:  Yes Other antiplatelets/anticoagulants:  Yes.   Plavix   ASSESSMENT/PLAN: This is a 84 y.o. female who is s/p aortogram and intervention left leg now with pain in right leg x 24 hrs   -will plan to take pt to OR emergently to restore blood flow to right leg.  Right lower extremity thrombectomy; possible bypass -npo now/consent -heparin gtt per pharmacy -Dr. Donzetta Matters has spoken to pt  and her daughter   Leontine Locket, [facility] [phone]    I have interviewed and examined patient with PA and agree with assessment  and plan above. Plan for OR today as described above.   Andrius Andrepont C. Donzetta Matters, MD [facility] [place] Office: [phone] Pager: [phone]

## 2018-04-09 NOTE — ED Triage Notes (Signed)
Pt has a palpable pulse but is weaker than the left.

## 2018-04-09 NOTE — Progress Notes (Signed)
ANTICOAGULATION CONSULT NOTE - Initial Consult  Pharmacy Consult for Heparin Indication: RLE arterial occlusion  Patient Measurements: Height: 5\' 1"  (154.9 cm) Weight: 110 lb (49.9 kg) IBW/kg (Calculated) : 47.8 Heparin Dosing Weight: 49.9 kg  Vital Signs: Temp: 98.6 F (37 C) (05/02 1415) Temp Source: Oral (05/02 1415) BP: 138/97 (05/02 1531) Pulse Rate: 76 (05/02 1531)  Labs: Recent Labs    04/07/18 1014 04/09/18 1316  HGB 13.3 12.0  HCT 39.0 36.4  PLT  --  171  CREATININE 0.80 0.78   Estimated Creatinine Clearance: 39.5 mL/min (by C-G formula based on SCr of 0.78 mg/dL).  Assessment: 59 yoF presenting with RLE pain, h/o PVD s/p stent placement to L superficial femoral and popliteal arteries 2 days ago. Also has h/o possible DVT in RLE. Venous duplex negative for DVT. Vascular surgery has consulted pharmacy to dose IV heparin and patient will be taken to OR for likely stent placement in RLE. CBC WNL, no bleeding noted.  Goal of Therapy:  Heparin level 0.3-0.7 units/ml Monitor platelets by anticoagulation protocol: Yes   Plan:  Give heparin 3000 units bolus x 1 Start heparin infusion at 800 units/hr Check heparin level in 8 hours Daily heparin level and CBC Monitor s/sx of bleeding  Carla Dawson N. Gerarda Fraction, PharmD PGY1 Pharmacy Resident Pager: [phone] 04/09/2018,3:49 PM

## 2018-04-09 NOTE — ED Triage Notes (Signed)
Pt had a procedure on her left leg earlier this week. Pt is now having pain and cramping in her right leg. She has hx of DVT. Pt takes plavix and low dose aspirin.

## 2018-04-09 NOTE — Telephone Encounter (Signed)
Call from patient's daughter. Patient c/o pain at back of calf right leg. At times has been too painful to walk on. States a little less pain today. Denies any change in temperature, color and no swelling. Has been on Plavix and started ASA 81 mg. Post AGM on 04/07/18. Encouraged her to call patient's PCP for evaluation.

## 2018-04-09 NOTE — ED Provider Notes (Signed)
Patient placed in Quick Look pathway, seen and evaluated   Chief Complaint: right leg pain  HPI:   Carla Dawson is a 84 y.o. female, presenting to the ED with right leg pain that began around 4 pm yesterday. Pain is aching, present with palpation and ambulation, 10/10, calf, nonradiating.  Underwent right lower extremity peripheral vascular catheterization with stent placement in the left superficial femoral and popliteal arteries on April 07, 2018.  Access was obtained through right femoral artery.  Denies fever/chills, nausea/vomiting, abdominal pain, acute numbness or weakness, pain at the insertion site, leg swelling, left leg pain, shortness of breath, chest pain.  ROS: Right leg pain (one)  Physical Exam:   Gen: No distress  Neuro: Awake and Alert  Skin: Warm    Focused Exam:   No diaphoresis.  No pallor.  Pulmonary: No increased work of breathing.  Speaks in full sentences without difficulty.  Lung sounds clear.  No tachypnea.  Cardiac: Normal rate and regular.  1+ DP and PT pulses on the right, 2+ on the left.  Right foot is cooler than the left, but not cold.  2-second capillary refill in the right toes. No tenderness, swelling, or erythema noted to the insertion site of the right anterior hip.  Abdominal: No abdominal tenderness.  No peritoneal signs.  No rebound tenderness.  No guarding.   Neurologic: Sensation intact in the bilateral lower extremities.  Strength 5/5 with flexion and extension at the bilateral ankles and knees.  MSK: Tenderness to the right Without noted swelling, increased warmth, or erythema.  No peripheral edema.   Initiation of care has begun. The patient has been counseled on the process, plan, and necessity for staying for the completion/evaluation, and the remainder of the medical screening examination   Layla Maw 04/09/18 1320    Lacretia Leigh, MD 04/09/18 1517

## 2018-04-09 NOTE — Progress Notes (Signed)
Right lower extremity venous duplex has been completed. Negative for DVT. Results were given to Arlean Hopping PA.  04/09/18 2:01 PM Carla Dawson RVT

## 2018-04-10 ENCOUNTER — Inpatient Hospital Stay (HOSPITAL_COMMUNITY): Payer: Medicare Other

## 2018-04-10 ENCOUNTER — Encounter (HOSPITAL_COMMUNITY): Payer: Self-pay | Admitting: Vascular Surgery

## 2018-04-10 DIAGNOSIS — Z9889 Other specified postprocedural states: Secondary | ICD-10-CM

## 2018-04-10 LAB — CBC
HEMATOCRIT: 33.6 % — AB (ref 36.0–46.0)
HEMOGLOBIN: 11 g/dL — AB (ref 12.0–15.0)
MCH: 29.5 pg (ref 26.0–34.0)
MCHC: 32.7 g/dL (ref 30.0–36.0)
MCV: 90.1 fL (ref 78.0–100.0)
Platelets: 158 10*3/uL (ref 150–400)
RBC: 3.73 MIL/uL — AB (ref 3.87–5.11)
RDW: 13.5 % (ref 11.5–15.5)
WBC: 11.3 10*3/uL — AB (ref 4.0–10.5)

## 2018-04-10 LAB — BASIC METABOLIC PANEL
ANION GAP: 5 (ref 5–15)
BUN: 9 mg/dL (ref 6–20)
CHLORIDE: 104 mmol/L (ref 101–111)
CO2: 29 mmol/L (ref 22–32)
Calcium: 8.4 mg/dL — ABNORMAL LOW (ref 8.9–10.3)
Creatinine, Ser: 0.76 mg/dL (ref 0.44–1.00)
GFR calc non Af Amer: >60 mL/min (ref >60)
Glucose, Bld: 109 mg/dL — ABNORMAL HIGH (ref 65–99)
POTASSIUM: 3.9 mmol/L (ref 3.5–5.1)
SODIUM: 138 mmol/L (ref 135–145)

## 2018-04-10 MED ORDER — OXYCODONE-ACETAMINOPHEN 5-325 MG PO TABS: 1.0000 | ORAL_TABLET | Freq: Four times a day (QID) | ORAL | 0 refills | Status: DC | PRN

## 2018-04-10 NOTE — Progress Notes (Signed)
D/c instructions given to pt and daughter. Prescription for Percocet given. IV removed, clean and intact. Telemetry removed. Daughter to escort pt home.  Clyde Canterbury, RN

## 2018-04-10 NOTE — Evaluation (Signed)
Occupational Therapy Evaluation and defer further OT to [facility] Patient Details Name: Carla Dawson MRN: [id number] DOB: 1931/03/27 Today's Date: 04/10/2018    History of Present Illness Carla Dawson is an 84yo white female who comes to [facility] after continued pain/claudication in RLE s/p vascualr procedures. Pt underwent RLE angiogram on 5/2.    Clinical Impression   PTA Pt independent in ADL and mobility. Pt Is currently min A for LB ADL and min guard for short mobility related to ADL. Pt will benefit from skilled OT in the acute setting and afterwards at the [facility] level. It is important to have continued therapy in her home environment to focus on safety. In addition, in 2 weeks she will be traveling to [place] and living in a non-handicapped setting (she will have to get over a tub etc) so HHOT will be essential for success in that setting as well as provide education on energy conservation.    Follow Up Recommendations  Home health OT    Equipment Recommendations  None recommended by OT    Recommendations for Other Services       Precautions / Restrictions Precautions Precautions: Fall Restrictions Weight Bearing Restrictions: No      Mobility Bed Mobility Overal bed mobility: Needs Assistance Bed Mobility: Sit to Supine       Sit to supine: Min assist   General bed mobility comments: assist for BLE back into bed  Transfers Overall transfer level: Modified independent Equipment used: None Transfers: Sit to/from Stand Sit to Stand: Supervision;Modified independent (Device/Increase time)              Balance Overall balance assessment: Mild deficits observed, not formally tested                             High Level Balance Comments: one LOB after AMB as pt enters room, turnign head to look over right shoulder, minA to stablize            ADL either performed or assessed with clinical judgement   ADL Overall ADL's : Needs  assistance/impaired Eating/Feeding: Independent   Grooming: Min guard;Standing   Upper Body Bathing: Set up;Sitting   Lower Body Bathing: Minimal assistance;Sitting/lateral leans   Upper Body Dressing : Set up   Lower Body Dressing: Minimal assistance;Sit to/from stand   Toilet Transfer: Min guard;Ambulation;Grab bars   Toileting- Clothing Manipulation and Hygiene: Min guard;Sit to/from stand   Tub/ Banker: Walk-in shower;Tub transfer;Moderate assistance;Ambulation Tub/Shower Transfer Details (indicate cue type and reason): Pt has a zero entry shower at her daughter's house but in 2 weeks will need to be able to step over tub Functional mobility during ADLs: Min guard General ADL Comments: limited access to LB due to incisional pain, would benefit from energy conservation in home environment     Vision Patient Visual Report: No change from baseline       Perception     Praxis      Pertinent Vitals/Pain Pain Assessment: 0-10 Pain Score: 4  Pain Location: icision site Pain Descriptors / Indicators: Discomfort;Sore Pain Intervention(s): Monitored during session;Repositioned     Hand Dominance Right   Extremity/Trunk Assessment Upper Extremity Assessment Upper Extremity Assessment: Overall WFL for tasks assessed   Lower Extremity Assessment Lower Extremity Assessment: RLE deficits/detail RLE Deficits / Details: limited hip flexion due to incisional pain RLE Coordination: decreased gross motor       Communication Communication Communication: No difficulties  Cognition Arousal/Alertness: Awake/alert Behavior During Therapy: WFL for tasks assessed/performed Overall Cognitive Status: Within Functional Limits for tasks assessed                                     General Comments  Pt's daughter present throughout session, very supportive    Exercises     Shoulder Instructions      Home Living Family/patient expects to be discharged  to:: Private residence Living Arrangements: Children Available Help at Discharge: Family Type of Home: House Home Access: Stairs to enter Technical brewer of Steps: 5 Entrance Stairs-Rails: Left Home Layout: One level     Bathroom Shower/Tub: Occupational psychologist: Handicapped height Bathroom Accessibility: Yes How Accessible: Accessible via wheelchair Home Equipment: [place] - 2 wheels;Shower seat - built in;Hand held shower head          Prior Functioning/Environment Level of Independence: Independent        Comments: stopped driving 3 YA d/t VI, has a fit bit and gets 5000-6000 steps daily in home, but does not AMB outside of house more than once weekly. Limited by COPD, but no recent falls or RW use         OT Problem List: Decreased range of motion;Impaired balance (sitting and/or standing);Decreased safety awareness;Decreased knowledge of use of DME or AE;Pain      OT Treatment/Interventions:      OT Goals(Current goals can be found in the care plan section) Acute Rehab OT Goals Patient Stated Goal: be independent again OT Goal Formulation: With patient/family Time For Goal Achievement: 04/24/18 Potential to Achieve Goals: Good  OT Frequency:     Barriers to D/C:            Co-evaluation              AM-PAC PT "6 Clicks" Daily Activity     Outcome Measure Help from another person eating meals?: None Help from another person taking care of personal grooming?: A Little Help from another person toileting, which includes using toliet, bedpan, or urinal?: A Little Help from another person bathing (including washing, rinsing, drying)?: A Little Help from another person to put on and taking off regular upper body clothing?: None Help from another person to put on and taking off regular lower body clothing?: A Little 6 Click Score: 20   End of Session Equipment Utilized During Treatment: Gait belt Nurse Communication: Mobility  status  Activity Tolerance: Patient tolerated treatment well Patient left: in bed;with call bell/phone within reach;with family/visitor present  OT Visit Diagnosis: Pain Pain - Right/Left: Right Pain - part of body: Hip                Time: 2841-3244 OT Time Calculation (min): 28 min Charges:  OT General Charges $OT Visit: 1 Visit OT Evaluation $OT Eval Low Complexity: 1 Low OT Treatments $Self Care/Home Management : 8-22 mins G-Codes:     Hulda Humphrey OTR/L [place] 04/10/2018, 2:49 PM

## 2018-04-10 NOTE — Discharge Summary (Signed)
Discharge Summary     Carla Dawson 04-26-1931 84 y.o. female  [id number]  Admission Date: 04/09/2018  Discharge Date: 04/10/18  Physician: Thomes Lolling*  Admission Diagnosis: Peripheral artery occlusion [facility]) [I77.9] Ischemia of extremity [I99.8]   HPI:   This is a 84 y.o. female who underwent an aortogram by Dr. Trula Slade on 04/07/18 with placement of drug eluding stent to left SFA and popliteal artery.  She states that her left leg feels fine.  Yesterday, she started having pain in her right calf when she was walking to the bathroom.  She took a gabapentin yesterday and felt like she may have had a little relief.  She does get relief with rest.   Her right foot is pale.  She states she has COPD but is not on oxygen at home.  Her daughter states that she tries to do a lot of walking and wears a fitbit.   She takes aspirin and Plavix.  She is on a CCB for blood pressure management.     Hospital Course:  The patient was admitted to the hospital and taken to the operating room on 04/09/2018 and underwent: 1.  Re-exposure right common femoral artery greater than 30 days  2.  Right common femoral endarterectomy with bovine pericardial patch angioplasty 3.  Right lower extremity embolectomy 4.  Right lower extremity angiogram    Findings: There was significant scar tissue in the right groin requiring Doppler to identify the very weak signal in the common femoral artery itself.  The bypass graft was anastomosed to the common femoral artery at the level of the profunda femoris takeoff.  The bypass graft itself did not have any thrombus within it.  The previously placed minx device was on the cephalad aspect of the common femoral artery and upon removing there was bleeding.  Part of minx device appeared to be within the artery and there was significant soft plaque causing thrombosis and a very focal area of the common femoral artery.  There was significant soft plaque and  narrowing throughout the common femoral artery.  After endarterectomy and patch angioplasty there was a strong pulse within the artery and there was a palpable pulse distally at the level of the popliteal and a strong peroneal signal.  The right lower extremity angiogram demonstrated flow through the bypass proximally as well as into the profunda femoris artery with significantly improved diameter of the common femoral artery and flow to the level of the ankle via the peroneal artery consistent with her previous angiograms.  The pt tolerated the procedure well and was transported to the PACU in good condition.   By POD 1, she is doing well.  Her calf is soft and non tender.  Her doppler signals right DP/peroneal are improved.  She is requiring 2LO2NC.  Incentive spirometer ordered.  Groin care discussed with pt and will place dry gauze to right groin to help prevent wound infection.  Discussed pain medicine with pt and she is tolerating Percocet.   She was able to be weaned off of the oxygen successfully and did well with ambulation.  She is discharged home.  The remainder of the hospital course consisted of increasing mobilization and increasing intake of solids without difficulty.  CBC    Component Value Date/Time   WBC 11.3 (H) 04/10/2018 0430   RBC 3.73 (L) 04/10/2018 0430   HGB 11.0 (L) 04/10/2018 0430   HCT 33.6 (L) 04/10/2018 0430   PLT 158 04/10/2018 0430  MCV 90.1 04/10/2018 0430   MCH 29.5 04/10/2018 0430   MCHC 32.7 04/10/2018 0430   RDW 13.5 04/10/2018 0430   LYMPHSABS 1.6 04/09/2018 1316   MONOABS 1.8 (H) 04/09/2018 1316   EOSABS 0.2 04/09/2018 1316   BASOSABS 0.0 04/09/2018 1316    BMET    Component Value Date/Time   NA 138 04/10/2018 0430   NA 141 04/23/2013   K 3.9 04/10/2018 0430   CL 104 04/10/2018 0430   CO2 29 04/10/2018 0430   GLUCOSE 109 (H) 04/10/2018 0430   BUN 9 04/10/2018 0430   BUN 13 04/23/2013   CREATININE 0.76 04/10/2018 0430   CREATININE 0.76  06/20/2017 1534   CALCIUM 8.4 (L) 04/10/2018 0430   GFRNONAA >60 04/10/2018 0430   GFRAA >60 04/10/2018 0430      Discharge Diagnosis:  Peripheral artery occlusion (HCC) [I77.9] Ischemia of extremity [I99.8]  Secondary Diagnosis: Patient Active Problem List   Diagnosis Date Noted  . Claudication of right lower extremity ([place]) 04/09/2018  . Ischemia of extremity 04/09/2018  . COPD with acute exacerbation ([place]) 03/23/2018  . Hyperlipidemia 02/24/2018  . Abnormal CXR 10/26/2016  . COPD  GOLD III  10/07/2016  . Essential hypertension, benign 10/07/2016  . PAD (peripheral artery disease) ([place]) 10/07/2016  . Neuropathy due to peripheral vascular disease ([place]) 10/07/2016   Past Medical History:  Diagnosis Date  . Allergy    "maybe seasonal allergies" (07/08/2017)  . Arthritis    "hands" (07/08/2017)  . Chicken pox   . COPD (chronic obstructive pulmonary disease) ([place])   . High cholesterol    "took me off pills ~ 1-2 months ago" (07/08/2017)  . History of blood transfusion 2017   "when I had blood clot in my leg"  . Hypertension   . PAD (peripheral artery disease) ([place])    severe/notes 10/14/2016  . Pneumonia ~ 2015  . PVD (peripheral vascular disease) ([place])      Allergies as of 04/10/2018      Reactions   Lortab [hydrocodone-acetaminophen] Shortness Of Breath, Itching, Other (See Comments)   turns real red, took benadryl    Morphine And Related       Medication List    TAKE these medications   albuterol (2.5 MG/3ML) 0.083% nebulizer solution Commonly known as:  PROVENTIL Take 3 mLs (2.5 mg total) by nebulization every 4 (four) hours as needed for wheezing or shortness of breath.   albuterol 108 (90 Base) MCG/ACT inhaler Commonly known as:  PROVENTIL HFA;VENTOLIN HFA INHALE 2 PUFFS BY MOUTH EVERY 4 HOURS AS NEEDED FOR WHEEZING   amLODipine 5 MG tablet Commonly known as:  NORVASC Take 1 tablet (5 mg total) by mouth at bedtime.   atorvastatin 10 MG tablet Commonly  known as:  LIPITOR Take 1 tablet (10 mg total) by mouth daily at 6 PM.   budesonide-formoterol 160-4.5 MCG/ACT inhaler Commonly known as:  SYMBICORT Inhale 2 puffs into the lungs 2 (two) times daily.   cetirizine 10 MG tablet Commonly known as:  ZYRTEC Take 10 mg by mouth at bedtime.   clopidogrel 75 MG tablet Commonly known as:  PLAVIX Take 1 tablet (75 mg total) by mouth at bedtime. TAKE 1 TABLET(75 MG) BY MOUTH DAILY What changed:    how much to take  how to take this  when to take this   gabapentin 300 MG capsule Commonly known as:  NEURONTIN Take 1 capsule (300 mg total) by mouth at bedtime as needed (for pain).  multivitamin with minerals Tabs tablet Take 1 tablet by mouth every evening.   oxyCODONE-acetaminophen 5-325 MG tablet Commonly known as:  PERCOCET Take 1 tablet by mouth every 6 (six) hours as needed for severe pain.   predniSONE 10 MG tablet Commonly known as:  DELTASONE Take  2 daily until better then 1 daily x 3 days and stop   SOLUBLE FIBER/PROBIOTICS Chew Chew 1 each by mouth daily. gummie Dispersive advantage   STIOLTO RESPIMAT IN Inhale 2 puffs into the lungs daily.   Tiotropium Bromide Monohydrate 2.5 MCG/ACT Aers Commonly known as:  SPIRIVA RESPIMAT Inhale 2 puffs into the lungs daily.       Discharge Instructions: [facility] Discharge instructions Lower Extremity Bypass Surgery  Please refer to the following instruction for your post-procedure care. Your surgeon or physician assistant will discuss any changes with you.  Activity  You are encouraged to walk as much as you can. You can slowly return to normal activities during the month after your surgery. Avoid strenuous activity and heavy lifting until your doctor tells you it's OK. Avoid activities such as vacuuming or swinging a golf club. Do not drive until your doctor give the OK and you are no longer taking prescription pain medications. It is  also normal to have difficulty with sleep habits, eating and bowel movement after surgery. These will go away with time.  Bathing/Showering  You may shower after you go home. Do not soak in a bathtub, hot tub, or swim until the incision heals completely.  Incision Care  Clean your incision with mild soap and water. Shower every day. Pat the area dry with a clean towel. You do not need a bandage unless otherwise instructed. Do not apply any ointments or creams to your incision. If you have open wounds you will be instructed how to care for them or a visiting nurse may be arranged for you. If you have staples or sutures along your incision they will be removed at your post-op appointment. You may have skin glue on your incision. Do not peel it off. It will come off on its own in about one week.  Wash the groin wound with soap and water daily and pat dry. (No tub bath-only shower)  Then put a dry gauze or washcloth in the groin to keep this area dry to help prevent wound infection.  Do this daily and as needed.  Do not use Vaseline or neosporin on your incisions.  Only use soap and water on your incisions and then protect and keep dry.  Diet  Resume your normal diet. There are no special food restrictions following this procedure. A low fat/ low cholesterol diet is recommended for all patients with vascular disease. In order to heal from your surgery, it is CRITICAL to get adequate nutrition. Your body requires vitamins, minerals, and protein. Vegetables are the best source of vitamins and minerals. Vegetables also provide the perfect balance of protein. Processed food has little nutritional value, so try to avoid this.  Medications  Resume taking all your medications unless your doctor or Physician Assistant tells you not to. If your incision is causing pain, you may take over-the-counter pain relievers such as acetaminophen (Tylenol). If you were prescribed a stronger pain medication, please aware  these medication can cause nausea and constipation. Prevent nausea by taking the medication with a snack or meal. Avoid constipation by drinking plenty of fluids and eating foods with high amount of fiber, such  as fruits, vegetables, and grains. Take Colace 100 mg (an over-the-counter stool softener) twice a day as needed for constipation. Do not take Tylenol if you are taking prescription pain medications.  Follow Up  Our office will schedule a follow up appointment 2-3 weeks following discharge.  Please call us immediately for any of the following conditions  .Severe or worsening pain in your legs or feet while at rest or while walking .Increase pain, redness, warmth, or drainage (pus) from your incision site(s) Fever of 101 degree or higher The swelling in your leg with the bypass suddenly worsens and becomes more painful than when you were in the hospital If you have been instructed to feel your graft pulse then you should do so every day. If you can no longer feel this pulse, call the office immediately. Not all patients are given this instruction.  Leg swelling is common after leg bypass surgery.  The swelling should improve over a few months following surgery. To improve the swelling, you may elevate your legs above the level of your heart while you are sitting or resting. Your surgeon or physician assistant may ask you to apply an ACE wrap or wear compression (TED) stockings to help to reduce swelling.  Reduce your risk of vascular disease  Stop smoking. If you would like help call [facility] at 1-800-QUIT-NOW 343-459-8969) or [place] at (202)586-2457.  Manage your cholesterol Maintain a desired weight Control your diabetes weight Control your diabetes Keep your blood pressure down  If you have any questions, please call the office at [phone]   Prescriptions given: 1.  Roxicet #20 No Refill  Disposition: home  Patient's condition: is Good  Follow up: 1. Dr.  Trula Slade or Dr. Donzetta Matters  in 2 weeks prior to leaving for [place].   Leontine Locket, PA-C [facility] [phone] 04/10/2018  7:36 AM  - For VQI Registry use ---   Post-op:  Wound infection: No  Graft infection: No  Transfusion: No    If yes, n/a units given New Arrhythmia: No Ipsilateral amputation: No, [ ]  Minor, [ ]  BKA, [ ]  AKA Discharge patency: [ ]  Primary, [ ]  Primary assisted, [ ]  Secondary, [ ]  Occluded Patency judged by: [x ] Dopper only, [ ]  Palpable graft pulse, []  Palpable distal pulse, [ ]  ABI inc. > 0.15, [ ]  Duplex Discharge ABI: R 0.88, L 0.8 D/C Ambulatory Status: Ambulatory  Complications: MI: No, [ ]  Troponin only, [ ]  EKG or Clinical CHF: No Resp failure:No, [ ]  Pneumonia, [ ]  Ventilator Chg in renal function: No, [ ]  Inc. Cr > 0.5, [ ]  Temp. Dialysis,  [ ]  Permanent dialysis Stroke: No, [ ]  Minor, [ ]  Major Return to OR: No  Reason for return to OR: [ ]  Bleeding, [ ]  Infection, [ ]  Thrombosis, [ ]  Revision  Discharge medications: Statin use:  yes ASA use:  no Plavix use:  yes Beta blocker use: no CCB use:  Yes ACEI use:   no ARB use:  no Coumadin use: no

## 2018-04-10 NOTE — Progress Notes (Signed)
ABI's have been completed. Right 0.88 Left 0.8  04/10/18 9:37 AM Carlos Levering RVT

## 2018-04-10 NOTE — Evaluation (Signed)
Physical Therapy Evaluation Patient Details Name: Carla Dawson MRN: [id number] DOB: 08-29-1931 Today's Date: 04/10/2018   History of Present Illness  Carla Dawson is an 84yo white female who comes to [facility] after continued pain/claudication in RLE s/p vascualr procedures. Pt underwent RLE angiogram on 5/2.   Clinical Impression  Pt admitted with above diagnosis. Pt currently with functional limitations due to the deficits listed below (see "PT Problem List"). Upon entry, the patient is received semirecumbent in chair with RN. History collected from daughter while RN assists patient to bathroom. The pt is awake and agreeable to participate. No acute distress noted at this time.  Intermittent desaturation throughout session, curiously rapid of a drop and with poor subjective correlation to be trusted as accurate in the setting of cold hands, improved values when hands are relaxed: session performed on 2L/min, however no home O2 use at baseline. Transfers and AMB performed at supervision without RW, one LOB at end of walk. Empirically, the patient demonstrates increased risk of recurrent falls AEB gait speed <0.23m/s, forward reach <5", and multiple LOB demonstrated throughout session. Pt will benefit from skilled PT intervention to increase independence and safety with basic mobility in preparation for discharge to the venue listed below.       Follow Up Recommendations Home health PT    Equipment Recommendations  None recommended by PT    Recommendations for Other Services       Precautions / Restrictions Precautions Precautions: Fall      Mobility  Bed Mobility               General bed mobility comments: received EOB from nursing   Transfers Overall transfer level: Modified independent Equipment used: None Transfers: Sit to/from Stand Sit to Stand: Supervision;Modified independent (Device/Increase time)            Ambulation/Gait Ambulation/Gait assistance:  Min assist;Min guard Ambulation Distance (Feet): 200 Feet Assistive device: None   Gait velocity: 0.44   General Gait Details: slow, somewhat unsteady, but near baseline. O2 desat periodically, but d/t poor finger cirulation, AMB on 2L today. One LOB entering room associated with headturn  Stairs Stairs: Yes Stairs assistance: Min guard Stair Management: One rail Left;Forwards Number of Stairs: 4 General stair comments: performed at baseline  Wheelchair Mobility    Modified Rankin (Stroke Patients Only)       Balance Overall balance assessment: Needs assistance;Modified Independent;Mild deficits observed, not formally tested                             High Level Balance Comments: one LOB after AMB as pt enters room, turnign head to look over right shoulder, minA to stablize              Pertinent Vitals/Pain Pain Assessment: No/denies pain    Home Living Family/patient expects to be discharged to:: Private residence Living Arrangements: Children Available Help at Discharge: Family Type of Home: House Home Access: Stairs to enter Entrance Stairs-Rails: Left Entrance Stairs-Number of Steps: 5 Home Layout: One level Home Equipment: Walker - 2 wheels      Prior Function Level of Independence: Independent         Comments: stopped driving 3 YA d/t VI, has a fit bit and gets 5000-6000 steps daily in home, but does not AMB outside of house more than once weekly. Limited by COPD, but no recent falls or RW use      Hand  Dominance        Extremity/Trunk Assessment                Communication      Cognition Arousal/Alertness: Awake/alert Behavior During Therapy: WFL for tasks assessed/performed Overall Cognitive Status: Within Functional Limits for tasks assessed                                        General Comments      Exercises     Assessment/Plan    PT Assessment Patient needs continued PT services  PT  Problem List Decreased strength;Decreased activity tolerance;Decreased balance;Decreased mobility       PT Treatment Interventions DME instruction;Gait training;Functional mobility training;Therapeutic activities;Therapeutic exercise;Balance training;Patient/family education    PT Goals (Current goals can be found in the Care Plan section)  Acute Rehab PT Goals Patient Stated Goal: regain strength and mobility  PT Goal Formulation: With patient Time For Goal Achievement: 04/17/18 Potential to Achieve Goals: Good    Frequency Min 2X/week   Barriers to discharge        Co-evaluation               AM-PAC PT "6 Clicks" Daily Activity  Outcome Measure Difficulty turning over in bed (including adjusting bedclothes, sheets and blankets)?: A Little Difficulty moving from lying on back to sitting on the side of the bed? : A Little Difficulty sitting down on and standing up from a chair with arms (e.g., wheelchair, bedside commode, etc,.)?: A Little Help needed moving to and from a bed to chair (including a wheelchair)?: A Little Help needed walking in hospital room?: A Little Help needed climbing 3-5 steps with a railing? : A Little 6 Click Score: 18    End of Session Equipment Utilized During Treatment: Gait belt;Oxygen Activity Tolerance: Patient tolerated treatment well;Patient limited by fatigue;Treatment limited secondary to medical complications (Comment)(O2 sats unclear ) Patient left: in bed;with family/visitor present;Other (comment)(OT in room) Nurse Communication: Mobility status;Precautions PT Visit Diagnosis: Unsteadiness on feet (R26.81);Difficulty in walking, not elsewhere classified (R26.2)    Time: 0973-5329 PT Time Calculation (min) (ACUTE ONLY): 23 min   Charges:   PT Evaluation $PT Eval High Complexity: 1 High PT Treatments $Therapeutic Activity: 8-22 mins   PT G Codes:        12:51 PM, 2018/04/29 Etta Grandchild, PT, DPT Physical Therapist - [place] [phone] (Pager)  [phone] (Office)     [place] C Apr 29, 2018, 12:48 PM

## 2018-04-10 NOTE — Progress Notes (Addendum)
  Progress Note    04/10/2018 7:14 AM 1 Day Post-Op  Subjective:  Says she's a little sore in her groin  Afebrile HR 70's-80's NSR 124'P-809'X systolic 83% 3AS5KN  Vitals:   04/09/18 2329 04/10/18 0401  BP: (!) 139/51 (!) 132/41  Pulse: 66 77  Resp: 14 20  Temp: 98.5 F (36.9 C) 98.3 F (36.8 C)  SpO2: 99% 97%    Physical Exam: Cardiac:  regular Lungs:  Non labored Incisions:  Clean and dry Extremities:  +doppler signals right DP/peroneal; calf is soft and non tender   CBC    Component Value Date/Time   WBC 11.3 (H) 04/10/2018 0430   RBC 3.73 (L) 04/10/2018 0430   HGB 11.0 (L) 04/10/2018 0430   HCT 33.6 (L) 04/10/2018 0430   PLT 158 04/10/2018 0430   MCV 90.1 04/10/2018 0430   MCH 29.5 04/10/2018 0430   MCHC 32.7 04/10/2018 0430   RDW 13.5 04/10/2018 0430   LYMPHSABS 1.6 04/09/2018 1316   MONOABS 1.8 (H) 04/09/2018 1316   EOSABS 0.2 04/09/2018 1316   BASOSABS 0.0 04/09/2018 1316    BMET    Component Value Date/Time   NA 138 04/10/2018 0430   NA 141 04/23/2013   K 3.9 04/10/2018 0430   CL 104 04/10/2018 0430   CO2 29 04/10/2018 0430   GLUCOSE 109 (H) 04/10/2018 0430   BUN 9 04/10/2018 0430   BUN 13 04/23/2013   CREATININE 0.76 04/10/2018 0430   CREATININE 0.76 06/20/2017 1534   CALCIUM 8.4 (L) 04/10/2018 0430   GFRNONAA >60 04/10/2018 0430   GFRAA >60 04/10/2018 0430    INR No results found for: INR   Intake/Output Summary (Last 24 hours) at 04/10/2018 0714 Last data filed at 04/10/2018 0500 Gross per 24 hour  Intake 1800 ml  Output 1626 ml  Net 174 ml     Assessment:  84 y.o. female is s/p:  1.  Re-exposure right common femoral artery greater than 30 days  2.  Right common femoral endarterectomy with bovine pericardial patch angioplasty 3.  Right lower extremity embolectomy 4.  Right lower extremity angiogram  1 Day Post-Op   Plan: -pt feeling better this morning and her doppler signals were improved from ER yesterday. -did well  getting up to the chair-will have them walk with the pt this am -incentive spirometer -she was not on oxygen at home but is requiring 2L this am.  -discussed groin care and keeping groin dry and its importance.  Dry gauze to groin daily and as needed. -DVT prophylaxis:  SQ heparin to start this afternoon. -possibly home tomorrow -pt allergic to hydrocodone-she is tolerating percocet here in the hospital and will give that for home.   Leontine Locket, PA-C [facility] [phone] 04/10/2018 7:14 AM  I have independently interviewed and examined patient and agree with PA assessment and plan above.  Plan as above.  Will need to see her back in office prior to her traveling [place].  Brandon C. Donzetta Matters, MD [facility] [place] Office: [phone] Pager: [phone]

## 2018-04-10 NOTE — Progress Notes (Addendum)
Spoke w patient and daughter at bedside. Patient has RW, declined additional DME. Possible DC late today pending WBC and CXR results. Currently on supplemental O2, doing IS. Will continue to follow.

## 2018-04-10 NOTE — Care Management Note (Signed)
Case Management Note  Patient Details  Name: Carla Dawson MRN: [id number] Date of Birth: 11-Jun-1931  Subjective/Objective:                 Spoke w patient and daughter. They would like to use [facility] for [facility] services PT OT. Referral accepted by [facility]. No DME needs. No other CM needs.    Action/Plan:   Expected Discharge Date:  04/10/18               Expected Discharge Plan:  [place]  In-House Referral:     Discharge planning Services  CM Consult  Post Acute Care Choice:  Home Health Choice offered to:  Patient, Adult Children  DME Arranged:    DME Agency:     HH Arranged:  PT, OT HH Agency:  [place]  Status of Service:  Completed, signed off  If discussed at [place] of Stay Meetings, dates discussed:    Additional Comments:  Carles Collet, RN 04/10/2018, 4:19 PM

## 2018-04-12 ENCOUNTER — Emergency Department (HOSPITAL_COMMUNITY): Payer: Medicare Other | Admitting: Anesthesiology

## 2018-04-12 ENCOUNTER — Encounter (HOSPITAL_COMMUNITY)
Admission: EM | Disposition: A | Discharge status: Home Health | Payer: Self-pay | Source: Home / Self Care | Attending: Vascular Surgery

## 2018-04-12 ENCOUNTER — Inpatient Hospital Stay (HOSPITAL_COMMUNITY)
Admission: EM | Admit: 2018-04-12 | Discharge: 2018-04-14 | DRG: 254 | Disposition: A | Discharge status: Home Health | Payer: Medicare Other | Attending: Vascular Surgery | Admitting: Vascular Surgery

## 2018-04-12 ENCOUNTER — Encounter (HOSPITAL_COMMUNITY): Payer: Self-pay | Admitting: Emergency Medicine

## 2018-04-12 ENCOUNTER — Other Ambulatory Visit: Payer: Self-pay

## 2018-04-12 DIAGNOSIS — Z7951 Long term (current) use of inhaled steroids: Secondary | ICD-10-CM

## 2018-04-12 DIAGNOSIS — Z87891 Personal history of nicotine dependence: Secondary | ICD-10-CM

## 2018-04-12 DIAGNOSIS — Z79899 Other long term (current) drug therapy: Secondary | ICD-10-CM | POA: Diagnosis not present

## 2018-04-12 DIAGNOSIS — J449 Chronic obstructive pulmonary disease, unspecified: Secondary | ICD-10-CM | POA: Diagnosis present

## 2018-04-12 DIAGNOSIS — Z7982 Long term (current) use of aspirin: Secondary | ICD-10-CM

## 2018-04-12 DIAGNOSIS — I10 Essential (primary) hypertension: Secondary | ICD-10-CM | POA: Diagnosis present

## 2018-04-12 DIAGNOSIS — I7389 Other specified peripheral vascular diseases: Principal | ICD-10-CM | POA: Diagnosis present

## 2018-04-12 DIAGNOSIS — M19042 Primary osteoarthritis, left hand: Secondary | ICD-10-CM | POA: Diagnosis present

## 2018-04-12 DIAGNOSIS — I999 Unspecified disorder of circulatory system: Secondary | ICD-10-CM

## 2018-04-12 DIAGNOSIS — R200 Anesthesia of skin: Secondary | ICD-10-CM | POA: Diagnosis present

## 2018-04-12 DIAGNOSIS — Z8249 Family history of ischemic heart disease and other diseases of the circulatory system: Secondary | ICD-10-CM | POA: Diagnosis not present

## 2018-04-12 DIAGNOSIS — M79672 Pain in left foot: Secondary | ICD-10-CM | POA: Diagnosis not present

## 2018-04-12 DIAGNOSIS — Z7902 Long term (current) use of antithrombotics/antiplatelets: Secondary | ICD-10-CM

## 2018-04-12 DIAGNOSIS — M19041 Primary osteoarthritis, right hand: Secondary | ICD-10-CM | POA: Diagnosis present

## 2018-04-12 DIAGNOSIS — Z885 Allergy status to narcotic agent status: Secondary | ICD-10-CM | POA: Diagnosis not present

## 2018-04-12 DIAGNOSIS — Z95828 Presence of other vascular implants and grafts: Secondary | ICD-10-CM

## 2018-04-12 DIAGNOSIS — Z9109 Other allergy status, other than to drugs and biological substances: Secondary | ICD-10-CM

## 2018-04-12 DIAGNOSIS — E785 Hyperlipidemia, unspecified: Secondary | ICD-10-CM | POA: Diagnosis not present

## 2018-04-12 DIAGNOSIS — I998 Other disorder of circulatory system: Secondary | ICD-10-CM

## 2018-04-12 DIAGNOSIS — I739 Peripheral vascular disease, unspecified: Secondary | ICD-10-CM | POA: Diagnosis present

## 2018-04-12 HISTORY — PX: FEMORAL-POPLITEAL BYPASS GRAFT: SHX937

## 2018-04-12 LAB — CBC WITH DIFFERENTIAL/PLATELET
Basophils Absolute: 0 10*3/uL (ref 0.0–0.1)
Basophils Relative: 0 %
EOS PCT: 1 %
Eosinophils Absolute: 0.1 10*3/uL (ref 0.0–0.7)
HEMATOCRIT: 32.3 % — AB (ref 36.0–46.0)
Hemoglobin: 10.4 g/dL — ABNORMAL LOW (ref 12.0–15.0)
Lymphocytes Relative: 12 %
Lymphs Abs: 1.1 10*3/uL (ref 0.7–4.0)
MCH: 28.7 pg (ref 26.0–34.0)
MCHC: 32.2 g/dL (ref 30.0–36.0)
MCV: 89.2 fL (ref 78.0–100.0)
Monocytes Absolute: 1.1 10*3/uL — ABNORMAL HIGH (ref 0.1–1.0)
Monocytes Relative: 11 %
Neutro Abs: 7.5 10*3/uL (ref 1.7–7.7)
Neutrophils Relative %: 76 %
PLATELETS: 209 10*3/uL (ref 150–400)
RBC: 3.62 MIL/uL — AB (ref 3.87–5.11)
RDW: 13.6 % (ref 11.5–15.5)
WBC: 9.8 10*3/uL (ref 4.0–10.5)

## 2018-04-12 LAB — BASIC METABOLIC PANEL
Anion gap: 8 (ref 5–15)
BUN: 9 mg/dL (ref 6–20)
CALCIUM: 8.8 mg/dL — AB (ref 8.9–10.3)
CO2: 28 mmol/L (ref 22–32)
CREATININE: 0.68 mg/dL (ref 0.44–1.00)
Chloride: 103 mmol/L (ref 101–111)
GFR calc Af Amer: >60 mL/min (ref >60)
Glucose, Bld: 99 mg/dL (ref 65–99)
Potassium: 3.8 mmol/L (ref 3.5–5.1)
SODIUM: 139 mmol/L (ref 135–145)

## 2018-04-12 LAB — PROTIME-INR
INR: 1.09
Prothrombin Time: 14 seconds (ref 11.4–15.2)

## 2018-04-12 LAB — MRSA PCR SCREENING: MRSA BY PCR: NEGATIVE

## 2018-04-12 SURGERY — BYPASS GRAFT FEMORAL-POPLITEAL ARTERY
Anesthesia: General | Laterality: Left

## 2018-04-12 MED ORDER — HYDRALAZINE HCL 20 MG/ML IJ SOLN: 5.0000 mg | INTRAMUSCULAR | Status: DC | PRN

## 2018-04-12 MED ORDER — ALBUTEROL SULFATE (2.5 MG/3ML) 0.083% IN NEBU: 2.5000 mg | INHALATION_SOLUTION | RESPIRATORY_TRACT | Status: DC | PRN

## 2018-04-12 MED ORDER — FENTANYL CITRATE (PF) 250 MCG/5ML IJ SOLN
INTRAMUSCULAR | Status: AC
  Filled 2018-04-12: qty 5

## 2018-04-12 MED ORDER — SENNOSIDES-DOCUSATE SODIUM 8.6-50 MG PO TABS: 1.0000 | ORAL_TABLET | Freq: Every evening | ORAL | Status: DC | PRN

## 2018-04-12 MED ORDER — TIOTROPIUM BROMIDE MONOHYDRATE 18 MCG IN CAPS
1.0000 | ORAL_CAPSULE | Freq: Every day | RESPIRATORY_TRACT | Status: DC
  Filled 2018-04-12: qty 5

## 2018-04-12 MED ORDER — ACETAMINOPHEN 325 MG PO TABS: 325.0000 mg | ORAL_TABLET | ORAL | Status: DC | PRN

## 2018-04-12 MED ORDER — ONDANSETRON HCL 4 MG/2ML IJ SOLN: 4.0000 mg | Freq: Four times a day (QID) | INTRAMUSCULAR | Status: DC | PRN

## 2018-04-12 MED ORDER — ALBUTEROL SULFATE HFA 108 (90 BASE) MCG/ACT IN AERS: 1.0000 | INHALATION_SPRAY | Freq: Four times a day (QID) | RESPIRATORY_TRACT | Status: DC | PRN

## 2018-04-12 MED ORDER — 0.9 % SODIUM CHLORIDE (POUR BTL) OPTIME
TOPICAL | Status: DC | PRN
  Administered 2018-04-12: 2000 mL

## 2018-04-12 MED ORDER — LABETALOL HCL 5 MG/ML IV SOLN: 10.0000 mg | INTRAVENOUS | Status: DC | PRN

## 2018-04-12 MED ORDER — GUAIFENESIN-DM 100-10 MG/5ML PO SYRP: 15.0000 mL | ORAL_SOLUTION | ORAL | Status: DC | PRN

## 2018-04-12 MED ORDER — CEFAZOLIN SODIUM-DEXTROSE 2-4 GM/100ML-% IV SOLN
2.0000 g | Freq: Three times a day (TID) | INTRAVENOUS | Status: AC
  Administered 2018-04-13 (×2): 2 g via INTRAVENOUS
  Filled 2018-04-12 (×2): qty 100

## 2018-04-12 MED ORDER — POTASSIUM CHLORIDE CRYS ER 20 MEQ PO TBCR: 20.0000 meq | EXTENDED_RELEASE_TABLET | Freq: Every day | ORAL | Status: DC | PRN

## 2018-04-12 MED ORDER — DEXAMETHASONE SODIUM PHOSPHATE 10 MG/ML IJ SOLN
INTRAMUSCULAR | Status: AC
  Filled 2018-04-12: qty 1

## 2018-04-12 MED ORDER — PHENYLEPHRINE HCL 10 MG/ML IJ SOLN
INTRAMUSCULAR | Status: DC | PRN
  Administered 2018-04-12: 80 ug via INTRAVENOUS

## 2018-04-12 MED ORDER — PHENOL 1.4 % MT LIQD: 1.0000 | OROMUCOSAL | Status: DC | PRN

## 2018-04-12 MED ORDER — ROCURONIUM BROMIDE 10 MG/ML (PF) SYRINGE
PREFILLED_SYRINGE | INTRAVENOUS | Status: DC | PRN
  Administered 2018-04-12: 40 mg via INTRAVENOUS
  Administered 2018-04-12 (×2): 20 mg via INTRAVENOUS

## 2018-04-12 MED ORDER — HEPARIN SODIUM (PORCINE) 1000 UNIT/ML IJ SOLN
INTRAMUSCULAR | Status: DC | PRN
  Administered 2018-04-12 (×2): 5000 [IU] via INTRAVENOUS

## 2018-04-12 MED ORDER — HYDROMORPHONE HCL 1 MG/ML IJ SOLN
0.5000 mg | INTRAMUSCULAR | Status: DC | PRN
  Administered 2018-04-13 (×2): 0.5 mg via INTRAVENOUS
  Filled 2018-04-12 (×2): qty 0.5

## 2018-04-12 MED ORDER — ACETAMINOPHEN 325 MG RE SUPP: 325.0000 mg | RECTAL | Status: DC | PRN

## 2018-04-12 MED ORDER — METOPROLOL TARTRATE 5 MG/5ML IV SOLN
2.0000 mg | INTRAVENOUS | Status: AC | PRN
  Administered 2018-04-13 (×2): 2 mg via INTRAVENOUS
  Filled 2018-04-12: qty 5

## 2018-04-12 MED ORDER — PROTAMINE SULFATE 10 MG/ML IV SOLN
INTRAVENOUS | Status: DC | PRN
  Administered 2018-04-12: 50 mg via INTRAVENOUS

## 2018-04-12 MED ORDER — ATORVASTATIN CALCIUM 10 MG PO TABS
10.0000 mg | ORAL_TABLET | Freq: Every day | ORAL | Status: DC
  Administered 2018-04-13: 10 mg via ORAL
  Filled 2018-04-12: qty 1

## 2018-04-12 MED ORDER — LACTATED RINGERS IV SOLN
INTRAVENOUS | Status: DC
  Administered 2018-04-12 (×2): via INTRAVENOUS

## 2018-04-12 MED ORDER — SUGAMMADEX SODIUM 200 MG/2ML IV SOLN
INTRAVENOUS | Status: DC | PRN
  Administered 2018-04-12: 200 mg via INTRAVENOUS

## 2018-04-12 MED ORDER — LACTATED RINGERS IV SOLN
INTRAVENOUS | Status: DC | PRN
  Administered 2018-04-12: 16:00:00 via INTRAVENOUS

## 2018-04-12 MED ORDER — PROPOFOL 10 MG/ML IV BOLUS
INTRAVENOUS | Status: AC
  Filled 2018-04-12: qty 20

## 2018-04-12 MED ORDER — THROMBIN 20000 UNITS EX SOLR
CUTANEOUS | Status: AC
  Filled 2018-04-12: qty 20000

## 2018-04-12 MED ORDER — LIDOCAINE 2% (20 MG/ML) 5 ML SYRINGE
INTRAMUSCULAR | Status: DC | PRN
  Administered 2018-04-12: 50 mg via INTRAVENOUS

## 2018-04-12 MED ORDER — PANTOPRAZOLE SODIUM 40 MG PO TBEC
40.0000 mg | DELAYED_RELEASE_TABLET | Freq: Every day | ORAL | Status: DC
  Administered 2018-04-13 – 2018-04-14 (×2): 40 mg via ORAL
  Filled 2018-04-12 (×2): qty 1

## 2018-04-12 MED ORDER — PHENYLEPHRINE 40 MCG/ML (10ML) SYRINGE FOR IV PUSH (FOR BLOOD PRESSURE SUPPORT)
PREFILLED_SYRINGE | INTRAVENOUS | Status: AC
  Filled 2018-04-12: qty 10

## 2018-04-12 MED ORDER — SODIUM CHLORIDE 0.9 % IV SOLN: 500.0000 mL | Freq: Once | INTRAVENOUS | Status: DC | PRN

## 2018-04-12 MED ORDER — ROCURONIUM BROMIDE 50 MG/5ML IV SOLN
INTRAVENOUS | Status: AC
  Filled 2018-04-12: qty 1

## 2018-04-12 MED ORDER — CEFAZOLIN SODIUM-DEXTROSE 1-4 GM/50ML-% IV SOLN
1.0000 g | INTRAVENOUS | Status: AC
  Administered 2018-04-12 (×2): 1 g via INTRAVENOUS
  Filled 2018-04-12: qty 50

## 2018-04-12 MED ORDER — ONDANSETRON HCL 4 MG/2ML IJ SOLN
INTRAMUSCULAR | Status: DC | PRN
  Administered 2018-04-12: 4 mg via INTRAVENOUS

## 2018-04-12 MED ORDER — HEPARIN SODIUM (PORCINE) 5000 UNIT/ML IJ SOLN
INTRAMUSCULAR | Status: DC | PRN
  Administered 2018-04-12: 500 mL

## 2018-04-12 MED ORDER — DEXAMETHASONE SODIUM PHOSPHATE 10 MG/ML IJ SOLN
INTRAMUSCULAR | Status: DC | PRN
  Administered 2018-04-12: 10 mg via INTRAVENOUS

## 2018-04-12 MED ORDER — MOMETASONE FURO-FORMOTEROL FUM 200-5 MCG/ACT IN AERO
2.0000 | INHALATION_SPRAY | Freq: Two times a day (BID) | RESPIRATORY_TRACT | Status: DC
  Administered 2018-04-13 – 2018-04-14 (×2): 2 via RESPIRATORY_TRACT
  Filled 2018-04-12: qty 8.8

## 2018-04-12 MED ORDER — AMLODIPINE BESYLATE 5 MG PO TABS
5.0000 mg | ORAL_TABLET | Freq: Every day | ORAL | Status: DC
  Administered 2018-04-13: 5 mg via ORAL
  Filled 2018-04-12: qty 1

## 2018-04-12 MED ORDER — DOCUSATE SODIUM 100 MG PO CAPS
100.0000 mg | ORAL_CAPSULE | Freq: Every day | ORAL | Status: DC
  Administered 2018-04-13 – 2018-04-14 (×2): 100 mg via ORAL
  Filled 2018-04-12 (×2): qty 1

## 2018-04-12 MED ORDER — ALUM & MAG HYDROXIDE-SIMETH 200-200-20 MG/5ML PO SUSP
15.0000 mL | ORAL | Status: DC | PRN
  Administered 2018-04-13: 30 mL via ORAL
  Filled 2018-04-12: qty 30

## 2018-04-12 MED ORDER — SODIUM CHLORIDE 0.9 % IV SOLN
INTRAVENOUS | Status: AC
  Filled 2018-04-12: qty 1.2

## 2018-04-12 MED ORDER — HEPARIN (PORCINE) IN NACL 100-0.45 UNIT/ML-% IJ SOLN
800.0000 [IU]/h | INTRAMUSCULAR | Status: DC
  Filled 2018-04-12: qty 250

## 2018-04-12 MED ORDER — CLOPIDOGREL BISULFATE 75 MG PO TABS
75.0000 mg | ORAL_TABLET | Freq: Every day | ORAL | Status: DC
  Administered 2018-04-13: 75 mg via ORAL
  Filled 2018-04-12: qty 1

## 2018-04-12 MED ORDER — SODIUM CHLORIDE 0.9 % IV SOLN: INTRAVENOUS | Status: DC

## 2018-04-12 MED ORDER — FENTANYL CITRATE (PF) 250 MCG/5ML IJ SOLN
INTRAMUSCULAR | Status: DC | PRN
  Administered 2018-04-12 (×10): 50 ug via INTRAVENOUS

## 2018-04-12 MED ORDER — MAGNESIUM SULFATE 2 GM/50ML IV SOLN: 2.0000 g | Freq: Every day | INTRAVENOUS | Status: DC | PRN

## 2018-04-12 MED ORDER — ONDANSETRON HCL 4 MG/2ML IJ SOLN
INTRAMUSCULAR | Status: AC
Start: 2018-04-12 — End: 2018-04-12
  Filled 2018-04-12: qty 2

## 2018-04-12 MED ORDER — HEPARIN SODIUM (PORCINE) 1000 UNIT/ML IJ SOLN
INTRAMUSCULAR | Status: AC
  Filled 2018-04-12: qty 1

## 2018-04-12 MED ORDER — BISACODYL 5 MG PO TBEC: 5.0000 mg | DELAYED_RELEASE_TABLET | Freq: Every day | ORAL | Status: DC | PRN

## 2018-04-12 MED ORDER — LORATADINE 10 MG PO TABS
10.0000 mg | ORAL_TABLET | Freq: Every day | ORAL | Status: DC
  Administered 2018-04-13 – 2018-04-14 (×2): 10 mg via ORAL
  Filled 2018-04-12 (×3): qty 1

## 2018-04-12 MED ORDER — HEMOSTATIC AGENTS (NO CHARGE) OPTIME
TOPICAL | Status: DC | PRN
  Administered 2018-04-12: 1 via TOPICAL

## 2018-04-12 MED ORDER — ENOXAPARIN SODIUM 30 MG/0.3ML ~~LOC~~ SOLN
30.0000 mg | SUBCUTANEOUS | Status: DC
  Administered 2018-04-13 – 2018-04-14 (×2): 30 mg via SUBCUTANEOUS
  Filled 2018-04-12 (×2): qty 0.3

## 2018-04-12 MED ORDER — PROPOFOL 10 MG/ML IV BOLUS
INTRAVENOUS | Status: DC | PRN
  Administered 2018-04-12: 110 mg via INTRAVENOUS

## 2018-04-12 MED ORDER — LIDOCAINE 2% (20 MG/ML) 5 ML SYRINGE
INTRAMUSCULAR | Status: AC
  Filled 2018-04-12: qty 5

## 2018-04-12 MED ORDER — GABAPENTIN 300 MG PO CAPS: 300.0000 mg | ORAL_CAPSULE | Freq: Every evening | ORAL | Status: DC | PRN

## 2018-04-12 MED ORDER — HEPARIN BOLUS VIA INFUSION
2500.0000 [IU] | Freq: Once | INTRAVENOUS | Status: DC
  Filled 2018-04-12: qty 2500

## 2018-04-12 MED ORDER — FENTANYL CITRATE (PF) 100 MCG/2ML IJ SOLN: 25.0000 ug | INTRAMUSCULAR | Status: DC | PRN

## 2018-04-12 MED ORDER — OXYCODONE HCL 5 MG PO TABS
5.0000 mg | ORAL_TABLET | ORAL | Status: DC | PRN
  Administered 2018-04-13 – 2018-04-14 (×3): 10 mg via ORAL
  Filled 2018-04-12 (×3): qty 2

## 2018-04-12 SURGICAL SUPPLY — 56 items
BANDAGE ESMARK 6X9 LF (GAUZE/BANDAGES/DRESSINGS) IMPLANT
BNDG ESMARK 6X9 LF (GAUZE/BANDAGES/DRESSINGS)
CANISTER SUCT 3000ML PPV (MISCELLANEOUS) ×2 IMPLANT
CANNULA VESSEL 3MM 2 BLNT TIP (CANNULA) ×4 IMPLANT
CLIP VESOCCLUDE MED 24/CT (CLIP) ×2 IMPLANT
CLIP VESOCCLUDE SM WIDE 24/CT (CLIP) ×2 IMPLANT
CUFF TOURNIQUET SINGLE 24IN (TOURNIQUET CUFF) IMPLANT
CUFF TOURNIQUET SINGLE 34IN LL (TOURNIQUET CUFF) IMPLANT
CUFF TOURNIQUET SINGLE 44IN (TOURNIQUET CUFF) IMPLANT
DERMABOND ADVANCED (GAUZE/BANDAGES/DRESSINGS)
DERMABOND ADVANCED .7 DNX12 (GAUZE/BANDAGES/DRESSINGS) IMPLANT
DRAIN SNY WOU (WOUND CARE) IMPLANT
DRAPE HALF SHEET 40X57 (DRAPES) IMPLANT
DRAPE X-RAY CASS 24X20 (DRAPES) IMPLANT
DRSG COVADERM 4X10 (GAUZE/BANDAGES/DRESSINGS) ×2 IMPLANT
DRSG COVADERM 4X6 (GAUZE/BANDAGES/DRESSINGS) ×2 IMPLANT
DRSG COVADERM 4X8 (GAUZE/BANDAGES/DRESSINGS) ×2 IMPLANT
ELECT REM PT RETURN 9FT ADLT (ELECTROSURGICAL) ×2
ELECTRODE REM PT RTRN 9FT ADLT (ELECTROSURGICAL) ×1 IMPLANT
EVACUATOR SILICONE 100CC (DRAIN) IMPLANT
GLOVE BIO SURGEON STRL SZ7.5 (GLOVE) ×2 IMPLANT
GLOVE BIOGEL PI IND STRL 6.5 (GLOVE) ×3 IMPLANT
GLOVE BIOGEL PI IND STRL 7.5 (GLOVE) ×3 IMPLANT
GLOVE BIOGEL PI INDICATOR 6.5 (GLOVE) ×3
GLOVE BIOGEL PI INDICATOR 7.5 (GLOVE) ×3
GLOVE ECLIPSE 7.0 STRL STRAW (GLOVE) ×2 IMPLANT
GLOVE SURG SS PI 7.5 STRL IVOR (GLOVE) ×2 IMPLANT
GOWN STRL REUS W/ TWL LRG LVL3 (GOWN DISPOSABLE) ×3 IMPLANT
GOWN STRL REUS W/TWL LRG LVL3 (GOWN DISPOSABLE) ×3
GRAFT PROPATEN THIN WALL 6X80 (Vascular Products) ×2 IMPLANT
HEMOSTAT SPONGE AVITENE ULTRA (HEMOSTASIS) ×2 IMPLANT
KIT BASIN OR (CUSTOM PROCEDURE TRAY) ×2 IMPLANT
KIT TURNOVER KIT B (KITS) ×2 IMPLANT
NS IRRIG 1000ML POUR BTL (IV SOLUTION) ×4 IMPLANT
PACK PERIPHERAL VASCULAR (CUSTOM PROCEDURE TRAY) ×2 IMPLANT
PAD ARMBOARD 7.5X6 YLW CONV (MISCELLANEOUS) ×4 IMPLANT
SET COLLECT BLD 21X3/4 12 (NEEDLE) IMPLANT
STOPCOCK 4 WAY LG BORE MALE ST (IV SETS) IMPLANT
SUT PROLENE 5 0 C 1 24 (SUTURE) ×2 IMPLANT
SUT PROLENE 6 0 CC (SUTURE) ×16 IMPLANT
SUT PROLENE 7 0 BV 1 (SUTURE) IMPLANT
SUT PROLENE 7 0 BV1 MDA (SUTURE) IMPLANT
SUT SILK 2 0 SH (SUTURE) ×2 IMPLANT
SUT SILK 3 0 (SUTURE) ×1
SUT SILK 3-0 18XBRD TIE 12 (SUTURE) ×1 IMPLANT
SUT VIC AB 2-0 SH 27 (SUTURE) ×1
SUT VIC AB 2-0 SH 27XBRD (SUTURE) ×1 IMPLANT
SUT VIC AB 3-0 SH 27 (SUTURE) ×4
SUT VIC AB 3-0 SH 27X BRD (SUTURE) ×4 IMPLANT
SUT VIC AB 4-0 PS2 27 (SUTURE) ×4 IMPLANT
TAPE UMBILICAL COTTON 1/8X30 (MISCELLANEOUS) IMPLANT
TOWEL GREEN STERILE (TOWEL DISPOSABLE) ×2 IMPLANT
TRAY FOLEY MTR SLVR 16FR STAT (SET/KITS/TRAYS/PACK) ×2 IMPLANT
TUBING EXTENTION W/L.L. (IV SETS) IMPLANT
UNDERPAD 30X30 (UNDERPADS AND DIAPERS) ×2 IMPLANT
WATER STERILE IRR 1000ML POUR (IV SOLUTION) ×2 IMPLANT

## 2018-04-12 NOTE — Transfer of Care (Signed)
Immediate Anesthesia Transfer of Care Note  Patient: Carla Dawson  Procedure(s) Performed: Vein Patch Angioplasty Below Knee Popliteal Artery; Left Femoral Endarterectomy with Profundaplasty; Left Femoral-Below Knee Popliteal Bypass Graft using a 59mm by 80cm Thin Wall Propaten Gortex Graft (Left )  Patient Location: PACU  Anesthesia Type:General  Level of Consciousness: oriented, drowsy, patient cooperative and responds to stimulation  Airway & Oxygen Therapy: Patient Spontanous Breathing and Patient connected to nasal cannula oxygen  Post-op Assessment: Report given to RN, Post -op Vital signs reviewed and stable and Patient moving all extremities X 4  Post vital signs: Reviewed and stable  Last Vitals:  Vitals Value Taken Time  BP 153/52 04/12/2018  8:13 PM  Temp    Pulse 85 04/12/2018  8:15 PM  Resp 16 04/12/2018  8:15 PM  SpO2 100 % 04/12/2018  8:15 PM  Vitals shown include unvalidated device data.  Last Pain:  Vitals:   04/12/18 1115  TempSrc:   PainSc: 0-No pain         Complications: No apparent anesthesia complications

## 2018-04-12 NOTE — ED Triage Notes (Signed)
Pt. Stated, I had 3 stents put into my left leg for clogging on Thursday and had a clot removed on Friday. During the night my left foot starting hurting terribly and I walked the floor for 6 hours.

## 2018-04-12 NOTE — Anesthesia Preprocedure Evaluation (Addendum)
Anesthesia Evaluation  Patient identified by MRN, date of birth, ID band Patient awake    Reviewed: Allergy & Precautions, NPO status , Patient's Chart, lab work & pertinent test results  Airway Mallampati: I  TM Distance: >3 FB Neck ROM: Full    Dental  (+) Edentulous Upper, Edentulous Lower   Pulmonary COPD,  COPD inhaler, former smoker,    breath sounds clear to auscultation       Cardiovascular hypertension, Pt. on medications + Peripheral Vascular Disease   Rhythm:Regular Rate:Normal     Neuro/Psych negative neurological ROS  negative psych ROS   GI/Hepatic negative GI ROS, Neg liver ROS,   Endo/Other  negative endocrine ROS  Renal/GU negative Renal ROS     Musculoskeletal  (+) Arthritis ,   Abdominal Normal abdominal exam  (+)   Peds  Hematology negative hematology ROS (+)   Anesthesia Other Findings   Reproductive/Obstetrics                            Lab Results  Component Value Date   WBC 9.8 04/12/2018   HGB 10.4 (L) 04/12/2018   HCT 32.3 (L) 04/12/2018   MCV 89.2 04/12/2018   PLT 209 04/12/2018   Lab Results  Component Value Date   CREATININE 0.76 04/10/2018   BUN 9 04/10/2018   NA 138 04/10/2018   K 3.9 04/10/2018   CL 104 04/10/2018   CO2 29 04/10/2018   No results found for: INR, PROTIME  EKG: normal sinus rhythm.  Anesthesia Physical Anesthesia Plan  ASA: III  Anesthesia Plan: General   Post-op Pain Management:    Induction: Intravenous  PONV Risk Score and Plan: 4 or greater and Ondansetron, Dexamethasone and Treatment may vary due to age or medical condition  Airway Management Planned: Oral ETT  Additional Equipment: Arterial line  Intra-op Plan:   Post-operative Plan: Extubation in OR  Informed Consent: I have reviewed the patients History and Physical, chart, labs and discussed the procedure including the risks, benefits and alternatives  for the proposed anesthesia with the patient or authorized representative who has indicated his/her understanding and acceptance.   Dental advisory given  Plan Discussed with: CRNA  Anesthesia Plan Comments:        Anesthesia Quick Evaluation

## 2018-04-12 NOTE — Progress Notes (Signed)
Pt awake and alerr Doppler signals DP peroneal PT in PACU To 2H.  Ruta Hinds, MD [facility] Office: [phone] Pager: [phone]

## 2018-04-12 NOTE — Anesthesia Procedure Notes (Signed)
Arterial Line Insertion Start/End5/04/2018 3:30 PM, 04/12/2018 3:33 PM Performed by: Effie Berkshire, MD, anesthesiologist  Patient location: Pre-op. Preanesthetic checklist: patient identified, IV checked, site marked, risks and benefits discussed, surgical consent, monitors and equipment checked, pre-op evaluation, timeout performed and anesthesia consent Lidocaine 1% used for infiltration Left, radial was placed Catheter size: 20 Fr Hand hygiene performed  and maximum sterile barriers used   Attempts: 1 Procedure performed without using ultrasound guided technique. Following insertion, dressing applied. Post procedure assessment: normal and unchanged  Patient tolerated the procedure well with no immediate complications.

## 2018-04-12 NOTE — Anesthesia Procedure Notes (Signed)
Procedure Name: Intubation Date/Time: 04/12/2018 3:31 PM Performed by: Clearnce Sorrel, CRNA Pre-anesthesia Checklist: Patient identified, Emergency Drugs available, Suction available and Patient being monitored Patient Re-evaluated:Patient Re-evaluated prior to induction Oxygen Delivery Method: Circle System Utilized Preoxygenation: Pre-oxygenation with 100% oxygen Induction Type: IV induction Ventilation: Mask ventilation without difficulty Laryngoscope Size: Mac and 3 Grade View: Grade I Tube type: Oral Tube size: 7.0 mm Number of attempts: 1 Airway Equipment and Method: Stylet and Oral airway Placement Confirmation: ETT inserted through vocal cords under direct vision,  positive ETCO2 and breath sounds checked- equal and bilateral Secured at: 21 cm Tube secured with: Tape Dental Injury: Teeth and Oropharynx as per pre-operative assessment

## 2018-04-12 NOTE — Op Note (Signed)
Procedure: Vein patch angioplasty left below-knee popliteal artery, left common femoral endarterectomy with profundoplasty and vein patch, left femoral to below-knee popliteal bypass with propaten.  Preoperative diagnosis: Acute ischemia left lower extremity  Postoperative diagnosis: Same  Anesthesia: General  Assistant: Gerri Lins, PA-C  Operative findings: #1 severe calcified left common femoral artery  2.  Small 2.5 mm below-knee popliteal artery moderate to severely calcified  3.  Posterior tibial Doppler signal at conclusion of case  Operative details: After obtaining informed consent, the patient was taken the operating.  The patient was placed in supine position on the operating table.  After induction of general anesthesia and endotracheal intubation, a Foley catheter was placed.  Next patient's left lower extremity was prepped and draped in usual sterile fashion.  Longitudinal incision was made in the left groin carried down through subtenons tissues down level left common femoral artery.  There were some adhesions to the anterior wall from previous cath sites.  The artery was dissected free circumferentially.  It was moderate to severely calcified especially along the posterior wall extending from the inguinal ligament down to the femoral bifurcation.  I was able to dissect free the external iliac artery just above the circumflex iliac branches and a vessel loop placed around this.  The vessel was soft in this location.  The profunda femoris and superficial femoral arteries were dissected free circumferentially Vesseloops were placed around these.  Next the greater saphenous vein was harvested through the medial portion the incision.  Several skip incisions were made down the medial aspect of the leg to fully harvest the vein.  The vein was of reasonable quality to its mid segment.  I harvested the vein all the way to the mid calf.  It was clipped distally and transected.  The vein was  then ligated at the saphenofemoral junction and transected.  It was gently distended with heparinized saline.  Only about half of the vein was usable.  At this point I decided to use a PTFE graft.  The vein was set aside in case a patch was needed.  The incision below the knee was deepened down in the below-knee popliteal space and carried down to the level of the below-knee popliteal artery.  This was dissected free circumferentially.  This was very small and thickened.  It was about 2-1/2 mm in diameter.  Vesseloops were placed proximal and distal to the planned site of arteriotomy.  Next a tunnel was created between the heads of the gastrocnemius muscle up to the subsartorial portion of the groin.  A 6 mm propatent graft was brought through this.  The patient was given 5000 units of intravenous heparin.  She was given an additional 5000 units of intravenous heparin during the course of the case.  Common femoral artery was controlled proximally with a Cooley clamp.  The profunda and superficial femoral arteries were controlled with Vesseloops.  Longitudinal opening was made in the common femoral artery there was a large amount of calcified plaque on the posterior wall and just below the inguinal ligament this obstructed a significant amount of the lumen.  I felt that the patient needed femoral endarterectomy in order to maintain patency of the graft.  An endarterectomy was begun and a suitable plane and carried from the level of the inguinal ligament all the way down to the femoral bifurcation.  I then extended the arteriotomy into the origin of the profunda and took out a large calcified plaque from this area as well.  This feathered out nicely into the profunda.  The superficial femoral artery was endarterectomized by eversion technique.  There was minimal backbleeding from this.  Next the previously harvested vein was placed in reverse configuration and opened longitudinally.  It was then sent on as a patch  angioplasty using a running 6-0 Prolene suture.  A completion anastomosis a longitudinal opening was made in the vein patch the graft was beveled and sewn endograft to side of patch using a running 6-0 Prolene suture.  A completion anastomosis the graft was clamped at its origin.  Flow was then restored to the profunda femoris artery.  Hemostasis was obtained with Avitene.  Attention was then turned to the below-knee popliteal space.  The below-knee popliteal artery was controlled proximally distally with fine bulldog clamps.  A longitudinal opening was made in the below-knee popliteal artery this was heavily calcified.  There was a lumen within the artery but the artery was quite small.  I decided to place a vein patch on this as well.  This was done with a running 6-0 Prolene suture.  I then made a longitudinal opening in the patch and cut the graft to length and beveled it and sewed it endograft to side of patch using running 6-0 Prolene suture.  Just prior to completion of the anastomosis it was forebled backbled and thoroughly flushed.  Anastomosis was secured clamps released there was good Doppler flow in the graft and below-knee popliteal artery immediately.  Pinching of the graft augmented flow approximately 80% with him pinching.  There is a posterior tibial Doppler signal which was body triphasic.  Avitene was placed in the below-knee popliteal incision.  The patient was given 50 mill grams of protamine.  Hemostasis was obtained.  The groin and below-knee popliteal incisions were closed in multiple layers of 2-0 and 3-0 Vicryl suture and 4-0 Vicryl subcuticular stitch in the skin.  The saphenectomy incisions were closed with multiple layers of running 3-0 Vicryl followed by 4-0 Vicryl subcuticular stitch in the skin.  Dry sterile dressings were applied all the incisions.  The patient tolerated procedure well and there were no complications.  The instrument sponge needle count was correct the end of the  case.  The patient was taken to the recovery room in stable condition.  Ruta Hinds, MD [facility] Office: [phone] Pager: [phone]

## 2018-04-12 NOTE — ED Provider Notes (Signed)
[facility] EMERGENCY DEPARTMENT Provider Note   CSN: [id number] Arrival date & time: 04/12/18  6063     History   Chief Complaint Chief Complaint  Patient presents with  . Foot Pain  . Post-op Problem    HPI Reyes Fifield is a 84 y.o. female.  HPI She began having severe pain in her left lower leg during the night last night, she took a Percocet with partial relief.  She had severe pain for about 6 hours and now has minimal pain in her left foot.  She is able to walk.  She came in by private vehicle.  She did not eat breakfast today.  Recent vascular procedures, right leg stenting, 1 week ago, followed by declot procedure and placement of the graft, 3 days ago.  Discharged 2 days ago able to walk and pain-free.  No previous problems with the left leg.  Status post aortic femoral bypass grafting.  No recent fever, chills, nausea, vomiting, cough, shortness of breath, weakness or dizziness.  She takes aspirin and Plavix.  There are no other known modifying factors.   Past Medical History:  Diagnosis Date  . Allergy    "maybe seasonal allergies" (07/08/2017)  . Arthritis    "hands" (07/08/2017)  . Chicken pox   . COPD (chronic obstructive pulmonary disease) ([place])   . High cholesterol    "took me off pills ~ 1-2 months ago" (07/08/2017)  . History of blood transfusion 2017   "when I had blood clot in my leg"  . Hypertension   . PAD (peripheral artery disease) ([place])    severe/notes 10/14/2016  . Pneumonia ~ 2015  . PVD (peripheral vascular disease) [facility])     Patient Active Problem List   Diagnosis Date Noted  . Claudication of right lower extremity ([place]) 04/09/2018  . Ischemia of extremity 04/09/2018  . COPD with acute exacerbation ([place]) 03/23/2018  . Hyperlipidemia 02/24/2018  . Abnormal CXR 10/26/2016  . COPD  GOLD III  10/07/2016  . Essential hypertension, benign 10/07/2016  . PAD (peripheral artery disease) ([place]) 10/07/2016  . Neuropathy due  to peripheral vascular disease ([place]) 10/07/2016    Past Surgical History:  Procedure Laterality Date  . ABDOMINAL AORTOGRAM N/A 04/22/2017   Procedure: Abdominal Aortogram;  Surgeon: Serafina Mitchell, MD;  Location: [facility];  Service: Cardiovascular;  Laterality: N/A;  . ABDOMINAL AORTOGRAM W/LOWER EXTREMITY N/A 03/04/2017   Procedure: Abdominal Aortogram w/Lower Extremity;  Surgeon: Serafina Mitchell, MD;  Location: [facility];  Service: Cardiovascular;  Laterality: N/A;  . ABDOMINAL AORTOGRAM W/LOWER EXTREMITY N/A 07/08/2017   Procedure: Abdominal Aortogram w/Lower Extremity;  Surgeon: Serafina Mitchell, MD;  Location: [facility];  Service: Cardiovascular;  Laterality: N/A;  . BIOPSY THYROID    . CATARACT EXTRACTION W/ INTRAOCULAR LENS  IMPLANT, BILATERAL Bilateral   . EMBOLECTOMY Right 04/09/2018   Procedure: RIGHT COMMON FEMORAL ENDARTERECTOMY  WITH BOVINE PATCH ANGIOPLASTY; RIGHT LOWER LEG THROMBOEMBOLECTOMY INTRAOPERATIVE ARTERIOGRAM;  Surgeon: Waynetta Sandy, MD;  Location: [facility];  Service: Vascular;  Laterality: Right;  . HEMATOMA EVACUATION Right 2017   "S/P procedure; discharged; had to come back for emergency OR"  . LOWER EXTREMITY ANGIOGRAPHY Bilateral 04/22/2017   Procedure: Lower Extremity Angiography;  Surgeon: Serafina Mitchell, MD;  Location: [facility];  Service: Cardiovascular;  Laterality: Bilateral;  . LOWER EXTREMITY ANGIOGRAPHY Bilateral 04/07/2018   Procedure: LOWER EXTREMITY ANGIOGRAPHY;  Surgeon: Serafina Mitchell, MD;  Location: [facility];  Service: Cardiovascular;  Laterality: Bilateral;  . PERIPHERAL VASCULAR BALLOON ANGIOPLASTY Left 03/04/2017   Procedure: Peripheral Vascular Balloon Angioplasty;  Surgeon: Serafina Mitchell, MD;  Location: [facility];  Service: Cardiovascular;  Laterality: Left;  SFA and PT  . PERIPHERAL VASCULAR INTERVENTION  07/08/2017  . PERIPHERAL VASCULAR INTERVENTION  07/08/2017   Procedure:  Peripheral Vascular Intervention;  Surgeon: Serafina Mitchell, MD;  Location: [facility];  Service: Cardiovascular;;  . PERIPHERAL VASCULAR INTERVENTION Left 04/07/2018   Procedure: PERIPHERAL VASCULAR INTERVENTION;  Surgeon: Serafina Mitchell, MD;  Location: [facility];  Service: Cardiovascular;  Laterality: Left;  Marland Kitchen VAGINAL HYSTERECTOMY    . VEIN BYPASS SURGERY       OB History   None      Home Medications    Prior to Admission medications   Medication Sig Start Date End Date Taking? Authorizing Provider  albuterol (PROVENTIL HFA;VENTOLIN HFA) 108 (90 Base) MCG/ACT inhaler INHALE 2 PUFFS BY MOUTH EVERY 4 HOURS AS NEEDED FOR WHEEZING 03/20/18   Tanda Rockers, MD  albuterol (PROVENTIL) (2.5 MG/3ML) 0.083% nebulizer solution Take 3 mLs (2.5 mg total) by nebulization every 4 (four) hours as needed for wheezing or shortness of breath. 06/04/17   Tanda Rockers, MD  amLODipine (NORVASC) 5 MG tablet Take 1 tablet (5 mg total) by mouth at bedtime. 02/24/18   [place], Betty G, MD  atorvastatin (LIPITOR) 10 MG tablet Take 1 tablet (10 mg total) by mouth daily at 6 PM. 04/10/18   Rhyne, Hulen Shouts, PA-C  budesonide-formoterol (SYMBICORT) 160-4.5 MCG/ACT inhaler Inhale 2 puffs into the lungs 2 (two) times daily. 04/03/18   Tanda Rockers, MD  cetirizine (ZYRTEC) 10 MG tablet Take 10 mg by mouth at bedtime.     [provider]  clopidogrel (PLAVIX) 75 MG tablet Take 1 tablet (75 mg total) by mouth at bedtime. TAKE 1 TABLET(75 MG) BY MOUTH DAILY 04/10/18   Rhyne, Samantha J, PA-C  gabapentin (NEURONTIN) 300 MG capsule Take 1 capsule (300 mg total) by mouth at bedtime as needed (for pain). 04/10/18   Rhyne, Hulen Shouts, PA-C  Multiple Vitamin (MULTIVITAMIN WITH MINERALS) TABS tablet Take 1 tablet by mouth every evening.     [provider]  oxyCODONE-acetaminophen (PERCOCET) 5-325 MG tablet Take 1 tablet by mouth every 6 (six) hours as needed for severe pain. 04/10/18   Rhyne,  Hulen Shouts, PA-C  predniSONE (DELTASONE) 10 MG tablet Take  2 daily until better then 1 daily x 3 days and stop Patient not taking: Reported on 04/09/2018 04/03/18   Tanda Rockers, MD  Probiotic Product (SOLUBLE FIBER/PROBIOTICS) CHEW Chew 1 each by mouth daily. gummie Dispersive advantage    [provider]  Tiotropium Bromide Monohydrate (SPIRIVA RESPIMAT) 2.5 MCG/ACT AERS Inhale 2 puffs into the lungs daily. Patient not taking: Reported on 04/09/2018 04/03/18   Tanda Rockers, MD  Tiotropium Bromide-Olodaterol (STIOLTO RESPIMAT IN) Inhale 2 puffs into the lungs daily.    [provider]    Family History Family History  Problem Relation Age of Onset  . Arthritis Mother   . Hypertension Mother   . Arthritis Father   . Hypertension Father   . Cancer Brother   . Hypertension Son     Social History Social History   Tobacco Use  . Smoking status: Former Smoker    Packs/day: 1.50    Years: 52.00    Pack  years: 78.00    Start date: 12/09/1953    Last attempt to quit: 12/09/2005    Years since quitting: 12.3  . Smokeless tobacco: Never Used  Substance Use Topics  . Alcohol use: No  . Drug use: No     Allergies   Lortab [hydrocodone-acetaminophen] and Morphine and related   Review of Systems Review of Systems  All other systems reviewed and are negative.    Physical Exam Updated Vital Signs BP (!) 119/50   Pulse (!) 58   Temp 99.1 F (37.3 C) (Oral)   Resp 16   Ht 5\' 1"  (1.549 m)   Wt 49.9 kg (110 lb)   SpO2 95%   BMI 20.78 kg/m   Physical Exam  Constitutional: She is oriented to person, place, and time. She appears well-developed and well-nourished.  HENT:  Head: Normocephalic and atraumatic.  Eyes: Pupils are equal, round, and reactive to light. Conjunctivae and EOM are normal.  Neck: Normal range of motion and phonation normal. Neck supple.  Cardiovascular: Normal rate and regular rhythm.  Toes 1 to left foot appears ischemic.  No palpable  or dopplerable pulse left dorsalis pedis and left posterior tibial.  Palpable femoral pulse.  Pulmonary/Chest: Effort normal and breath sounds normal. She exhibits no tenderness.  Abdominal: Soft. She exhibits no distension. There is no tenderness. There is no guarding.  Musculoskeletal: Normal range of motion.  Left lower leg cool to touch relative to right  Neurological: She is alert and oriented to person, place, and time. She exhibits normal muscle tone.  Skin: Skin is warm and dry.  Psychiatric: She has a normal mood and affect. Her behavior is normal. Judgment and thought content normal.  Nursing note and vitals reviewed.    ED Treatments / Results  Labs (all labs ordered are listed, but only abnormal results are displayed) Labs Reviewed  BASIC METABOLIC PANEL  CBC WITH DIFFERENTIAL/PLATELET  PROTIME-INR    EKG None  Radiology No results found.  Procedures Procedures (including critical care time)  Medications Ordered in ED Medications - No data to display   Initial Impression / Assessment and Plan / ED Course  I have reviewed the triage vital signs and the nursing notes.  Pertinent labs & imaging results that were available during my care of the patient were reviewed by me and considered in my medical decision making (see chart for details).  Clinical Course as of Apr 12 1324  Sun Apr 12, 2018  1302 Case discussed with Dr. Oneida Alar, vascular surgery who will evaluate the patient in the ED.   [EW]    Clinical Course User Index [EW] Daleen Bo, MD     Patient Vitals for the past 24 hrs:  BP Temp Temp src Pulse Resp SpO2 Height Weight  04/12/18 1230 (!) 119/50 - - (!) 58 - 95 % - -  04/12/18 1145 (!) 129/56 - - 70 - 94 % - -  04/12/18 1130 (!) 136/55 - - 66 - 92 % - -  04/12/18 0955 101/78 99.1 F (37.3 C) Oral 90 16 92 % 5\' 1"  (1.549 m) 49.9 kg (110 lb)    1:07 PM Reevaluation with update and discussion. After initial assessment and treatment, an  updated evaluation reveals the patient remains comfortable and has no further complaints.  Findings were discussed with the patient and daughter, plan discussed with patient and daughter, all questions answered. [place] decision making-ischemia left lower leg and foot, likely due to  clot, patient likely needs clot extraction semi-urgently.  Vascular surgery consulted for evaluation the ED and plans on seeing her.  Nursing Notes Reviewed/ Care Coordinated Applicable Imaging Reviewed Interpretation of Laboratory Data incorporated into ED treatment   Plan-as per vascular surgery     Final Clinical Impressions(s) / ED Diagnoses   Final diagnoses:  Ischemic pain of left foot    ED Discharge Orders    None       Daleen Bo, MD 04/12/18 1326

## 2018-04-12 NOTE — Progress Notes (Signed)
ANTICOAGULATION CONSULT NOTE - Initial Consult  Pharmacy Consult for Heparin Indication: Ischemic LLE  Allergies  Allergen Reactions  . Lortab [Hydrocodone-Acetaminophen] Shortness Of Breath, Itching and Other (See Comments)    turns real red, took benadryl   . Morphine And Related     Patient Measurements: Height: 5\' 1"  (154.9 cm) Weight: 110 lb (49.9 kg) IBW/kg (Calculated) : 47.8 Heparin Dosing Weight:  49.9 kg  Vital Signs: Temp: 99.1 F (37.3 C) (05/05 0955) Temp Source: Oral (05/05 0955) BP: 119/50 (05/05 1230) Pulse Rate: 58 (05/05 1230)  Labs: Recent Labs    04/10/18 0430 04/12/18 1249  HGB 11.0* 10.4*  HCT 33.6* 32.3*  PLT 158 209  LABPROT  --  14.0  INR  --  1.09  CREATININE 0.76  --     Estimated Creatinine Clearance: 39.5 mL/min (by C-G formula based on SCr of 0.76 mg/dL).   Medical History: Past Medical History:  Diagnosis Date  . Allergy    "maybe seasonal allergies" (07/08/2017)  . Arthritis    "hands" (07/08/2017)  . Chicken pox   . COPD (chronic obstructive pulmonary disease) ([place])   . High cholesterol    "took me off pills ~ 1-2 months ago" (07/08/2017)  . History of blood transfusion 2017   "when I had blood clot in my leg"  . Hypertension   . PAD (peripheral artery disease) ([place])    severe/notes 10/14/2016  . Pneumonia ~ 2015  . PVD (peripheral vascular disease) ([facility])     Assessment: CC/HPI: Severe LLE pain. Recent vascular procedures, right leg stenting 1 week ago, followed by declot procedure and placement of the graft, 3 days ago. Discharged 5/3.  PMH: seasonal allergies, arthritis, COPD, HLD, HTN, PAD, PAD/PVD, RLE claudication 04/09/18, neuropathy  Anticoag: Heparin for  Hgb down to 10.4 from previous. Plts 209. INR 1.09. No anticoag PTA.  Goal of Therapy:  Heparin level 0.3-0.7 units/ml Monitor platelets by anticoagulation protocol: Yes   Plan:  Consult vascular Heparin 2500 unit bolus Heparin infusion at 800  unit/hr Heparin level in 6-8 hrs Daily HL and CBC   Tara Wich S. Alford Highland, PharmD, BCPS Clinical Staff Pharmacist Pager [place], [place] 04/12/2018,2:03 PM

## 2018-04-12 NOTE — H&P (Signed)
Referring Physician: Dr Marliss C[facility]s [facility]  Patient name: Carla Dawson MRN: [id number] DOB: 14-Jul-1931 Sex: female  REASON FOR CONSULT: left leg ischemia  HPI: Carla Dawson is a 84 y.o. female s/p left SFA atherectomy 20016 in [place], left SFA PTA 3/18, left SFA stent 7/18, redo left SFA stent 4/19 all by Dr Trula Slade.  She was recently discharged from the hospital after a right groin complication from her most recent stent requiring right femoral endarterctomy.  She has also had prior right fem pop bypass.  She had an episode around midnight where her left foot began to hurt.  This progressed to numbness over the next few hours but has now improved somewhat.  She has also noticed her foot is cool and pale. She is on Plavix and Aspirin and a statin.  Other medical problems include COPD and hypertension which have been stable.  Past Medical History:  Diagnosis Date  . Allergy    "maybe seasonal allergies" (07/08/2017)  . Arthritis    "hands" (07/08/2017)  . Chicken pox   . COPD (chronic obstructive pulmonary disease) ([place])   . High cholesterol    "took me off pills ~ 1-2 months ago" (07/08/2017)  . History of blood transfusion 2017   "when I had blood clot in my leg"  . Hypertension   . PAD (peripheral artery disease) ([place])    severe/notes 10/14/2016  . Pneumonia ~ 2015  . PVD (peripheral vascular disease) ([place])    Past Surgical History:  Procedure Laterality Date  . ABDOMINAL AORTOGRAM N/A 04/22/2017   Procedure: Abdominal Aortogram;  Surgeon: Serafina Mitchell, MD;  Location: [facility];  Service: Cardiovascular;  Laterality: N/A;  . ABDOMINAL AORTOGRAM W/LOWER EXTREMITY N/A 03/04/2017   Procedure: Abdominal Aortogram w/Lower Extremity;  Surgeon: Serafina Mitchell, MD;  Location: [facility];  Service: Cardiovascular;  Laterality: N/A;  . ABDOMINAL AORTOGRAM W/LOWER EXTREMITY N/A 07/08/2017   Procedure: Abdominal Aortogram w/Lower Extremity;  Surgeon:  Serafina Mitchell, MD;  Location: [facility];  Service: Cardiovascular;  Laterality: N/A;  . BIOPSY THYROID    . CATARACT EXTRACTION W/ INTRAOCULAR LENS  IMPLANT, BILATERAL Bilateral   . EMBOLECTOMY Right 04/09/2018   Procedure: RIGHT COMMON FEMORAL ENDARTERECTOMY  WITH BOVINE PATCH ANGIOPLASTY; RIGHT LOWER LEG THROMBOEMBOLECTOMY INTRAOPERATIVE ARTERIOGRAM;  Surgeon: Waynetta Sandy, MD;  Location: [facility];  Service: Vascular;  Laterality: Right;  . HEMATOMA EVACUATION Right 2017   "S/P procedure; discharged; had to come back for emergency OR"  . LOWER EXTREMITY ANGIOGRAPHY Bilateral 04/22/2017   Procedure: Lower Extremity Angiography;  Surgeon: Serafina Mitchell, MD;  Location: [facility];  Service: Cardiovascular;  Laterality: Bilateral;  . LOWER EXTREMITY ANGIOGRAPHY Bilateral 04/07/2018   Procedure: LOWER EXTREMITY ANGIOGRAPHY;  Surgeon: Serafina Mitchell, MD;  Location: [facility];  Service: Cardiovascular;  Laterality: Bilateral;  . PERIPHERAL VASCULAR BALLOON ANGIOPLASTY Left 03/04/2017   Procedure: Peripheral Vascular Balloon Angioplasty;  Surgeon: Serafina Mitchell, MD;  Location: [facility];  Service: Cardiovascular;  Laterality: Left;  SFA and PT  . PERIPHERAL VASCULAR INTERVENTION  07/08/2017  . PERIPHERAL VASCULAR INTERVENTION  07/08/2017   Procedure: Peripheral Vascular Intervention;  Surgeon: Serafina Mitchell, MD;  Location: [facility];  Service: Cardiovascular;;  . PERIPHERAL VASCULAR INTERVENTION Left 04/07/2018   Procedure: PERIPHERAL VASCULAR INTERVENTION;  Surgeon: Serafina Mitchell, MD;  Location: [facility];  Service: Cardiovascular;  Laterality: Left;  .  VAGINAL HYSTERECTOMY    . VEIN BYPASS SURGERY      Family History  Problem Relation Age of Onset  . Arthritis Mother   . Hypertension Mother   . Arthritis Father   . Hypertension Father   . Cancer Brother   . Hypertension Son     SOCIAL HISTORY: Social History    Socioeconomic History  . Marital status: Divorced    Spouse name: Not on file  . Number of children: Not on file  . Years of education: Not on file  . Highest education level: Not on file  Occupational History  . Not on file  Social Needs  . Financial resource strain: Not on file  . Food insecurity:    Worry: Not on file    Inability: Not on file  . Transportation needs:    Medical: Not on file    Non-medical: Not on file  Tobacco Use  . Smoking status: Former Smoker    Packs/day: 1.50    Years: 52.00    Pack years: 78.00    Start date: 12/09/1953    Last attempt to quit: 12/09/2005    Years since quitting: 12.3  . Smokeless tobacco: Never Used  Substance and Sexual Activity  . Alcohol use: No  . Drug use: No  . Sexual activity: Not Currently  Lifestyle  . Physical activity:    Days per week: Not on file    Minutes per session: Not on file  . Stress: Not on file  Relationships  . Social connections:    Talks on phone: Not on file    Gets together: Not on file    Attends religious service: Not on file    Active member of club or organization: Not on file    Attends meetings of clubs or organizations: Not on file    Relationship status: Not on file  . Intimate partner violence:    Fear of current or ex partner: Not on file    Emotionally abused: Not on file    Physically abused: Not on file    Forced sexual activity: Not on file  Other Topics Concern  . Not on file  Social History Narrative  . Not on file    Allergies  Allergen Reactions  . Lortab [Hydrocodone-Acetaminophen] Shortness Of Breath, Itching and Other (See Comments)    turns real red, took benadryl   . Morphine And Related     No current facility-administered medications for this encounter.    Current Outpatient Medications  Medication Sig Dispense Refill  . albuterol (PROVENTIL HFA;VENTOLIN HFA) 108 (90 Base) MCG/ACT inhaler INHALE 2 PUFFS BY MOUTH EVERY 4 HOURS AS NEEDED FOR WHEEZING 8.5 g 0   . albuterol (PROVENTIL) (2.5 MG/3ML) 0.083% nebulizer solution Take 3 mLs (2.5 mg total) by nebulization every 4 (four) hours as needed for wheezing or shortness of breath. 75 mL 12  . amLODipine (NORVASC) 5 MG tablet Take 1 tablet (5 mg total) by mouth at bedtime. 90 tablet 3  . atorvastatin (LIPITOR) 10 MG tablet Take 1 tablet (10 mg total) by mouth daily at 6 PM.    . budesonide-formoterol (SYMBICORT) 160-4.5 MCG/ACT inhaler Inhale 2 puffs into the lungs 2 (two) times daily. 1 Inhaler 11  . cetirizine (ZYRTEC) 10 MG tablet Take 10 mg by mouth at bedtime.     . clopidogrel (PLAVIX) 75 MG tablet Take 1 tablet (75 mg total) by mouth at bedtime. TAKE 1 TABLET(75 MG) BY MOUTH DAILY    .  gabapentin (NEURONTIN) 300 MG capsule Take 1 capsule (300 mg total) by mouth at bedtime as needed (for pain).    . Multiple Vitamin (MULTIVITAMIN WITH MINERALS) TABS tablet Take 1 tablet by mouth every evening.     Marland Kitchen oxyCODONE-acetaminophen (PERCOCET) 5-325 MG tablet Take 1 tablet by mouth every 6 (six) hours as needed for severe pain. 20 tablet 0  . predniSONE (DELTASONE) 10 MG tablet Take  2 daily until better then 1 daily x 3 days and stop (Patient not taking: Reported on 04/09/2018) 60 tablet 0  . Probiotic Product (SOLUBLE FIBER/PROBIOTICS) CHEW Chew 1 each by mouth daily. gummie Dispersive advantage    . Tiotropium Bromide Monohydrate (SPIRIVA RESPIMAT) 2.5 MCG/ACT AERS Inhale 2 puffs into the lungs daily. (Patient not taking: Reported on 04/09/2018) 1 Inhaler 11  . Tiotropium Bromide-Olodaterol (STIOLTO RESPIMAT IN) Inhale 2 puffs into the lungs daily.      ROS:   General:  No weight loss, Fever, chills  HEENT: No recent headaches, no nasal bleeding, no visual changes, no sore throat  Neurologic: No dizziness, blackouts, seizures. No recent symptoms of stroke or mini- stroke. No recent episodes of slurred speech, or temporary blindness.  Cardiac: No recent episodes of chest pain/pressure, no shortness of  breath at rest.  + shortness of breath with exertion.  Denies history of atrial fibrillation or irregular heartbeat  Vascular: + history of rest pain in feet.  No history of claudication.  No history of non-healing ulcer, No history of DVT   Pulmonary: No home oxygen, no productive cough, no hemoptysis,  No asthma or wheezing  Musculoskeletal:  [ ]  Arthritis, [ ]  Low back pain,  [ ]  Joint pain  Hematologic:No history of hypercoagulable state.  No history of easy bleeding.  No history of anemia  Gastrointestinal: No hematochezia or melena,  No gastroesophageal reflux, no trouble swallowing  Urinary: [ ]  chronic Kidney disease, [ ]  on HD - [ ]  MWF or [ ]  TTHS, [ ]  Burning with urination, [ ]  Frequent urination, [ ]  Difficulty urinating;   Skin: No rashes  Psychological: No history of anxiety,  No history of depression   Physical Examination  Vitals:   04/12/18 0955 04/12/18 1130 04/12/18 1145 04/12/18 1230  BP: 101/78 (!) 136/55 (!) 129/56 (!) 119/50  Pulse: 90 66 70 (!) 58  Resp: 16     Temp: 99.1 F (37.3 C)     TempSrc: Oral     SpO2: 92% 92% 94% 95%  Weight: 110 lb (49.9 kg)     Height: 5\' 1"  (1.549 m)       Body mass index is 20.78 kg/m.  General:  Alert and oriented, no acute distress HEENT: Normal Neck: No JVD Pulmonary: Clear to auscultation bilaterally Cardiac: Regular Rate and Rhythm  Abdomen: Soft, non-tender, non-distended Skin: No rash, healing right groin incision with some ecchymosis and surrounding erythema, left forefoot pale, leg cool from midcalf down Extremity Pulses:  2+ radial, brachial, femoral,absent dorsalis pedis, posterior tibial pulses bilaterally, monophasic faint PT right, brisk triphasic right peroneal biphasic PT Musculoskeletal: No deformity or edema  Neurologic: Upper and lower extremity motor 5/5 and symmetric, able to move both feet symmetrically with symmetric sensation  DATA:  CBC    Component Value Date/Time   WBC 9.8 04/12/2018  1249   RBC 3.62 (L) 04/12/2018 1249   HGB 10.4 (L) 04/12/2018 1249   HCT 32.3 (L) 04/12/2018 1249   PLT 209 04/12/2018 1249   MCV 89.2  04/12/2018 1249   MCH 28.7 04/12/2018 1249   MCHC 32.2 04/12/2018 1249   RDW 13.6 04/12/2018 1249   LYMPHSABS 1.1 04/12/2018 1249   MONOABS 1.1 (H) 04/12/2018 1249   EOSABS 0.1 04/12/2018 1249   BASOSABS 0.0 04/12/2018 1249    BMET    Component Value Date/Time   NA 138 04/10/2018 0430   NA 141 04/23/2013   K 3.9 04/10/2018 0430   CL 104 04/10/2018 0430   CO2 29 04/10/2018 0430   GLUCOSE 109 (H) 04/10/2018 0430   BUN 9 04/10/2018 0430   BUN 13 04/23/2013   CREATININE 0.76 04/10/2018 0430   CREATININE 0.76 06/20/2017 1534   CALCIUM 8.4 (L) 04/10/2018 0430   GFRNONAA >60 04/10/2018 0430   GFRAA >60 04/10/2018 0430   Angio 04/07/18 reviewed.  Left SFA popliteal stent extending from 4 cm below sfa origin to patella level.  3 vessel runoff left foot. Areas of narrowing above and below stent  ASSESSMENT:  Acute ischemia left leg most likely stent occlusion with failure after four prior percutaneous interventions.  Redo stent has occluded after 5 days.   PLAN:  Left fem pop bypass today Keep NPO Consent Procedure risk benefits discussed with pt and daughter at [place], MD [facility] Office: [phone] Pager: [phone]

## 2018-04-12 NOTE — Anesthesia Postprocedure Evaluation (Signed)
Anesthesia Post Note  Patient: [name]  Procedure(s) Performed: Vein Patch Angioplasty Below Knee Popliteal Artery; Left Femoral Endarterectomy with Profundaplasty; Left Femoral-Below Knee Popliteal Bypass Graft using a 9mm by 80cm Thin Wall Propaten Gortex Graft (Left )     Patient location during evaluation: PACU Anesthesia Type: General Level of consciousness: awake and alert Pain management: pain level controlled Vital Signs Assessment: post-procedure vital signs reviewed and stable Respiratory status: spontaneous breathing, nonlabored ventilation, respiratory function stable and patient connected to nasal cannula oxygen Cardiovascular status: blood pressure returned to baseline and stable Postop Assessment: no apparent nausea or vomiting Anesthetic complications: no    Last Vitals:  Vitals:   04/12/18 2030 04/12/18 2045  BP: (!) 135/56   Pulse: 81   Resp: 14   Temp:  (!) 36.4 C  SpO2: 100%     Last Pain:  Vitals:   04/12/18 2045  TempSrc:   PainSc: 0-No pain                 Effie Berkshire

## 2018-04-13 ENCOUNTER — Encounter (HOSPITAL_COMMUNITY): Payer: Medicare Other

## 2018-04-13 ENCOUNTER — Encounter (HOSPITAL_COMMUNITY): Payer: Self-pay | Admitting: Vascular Surgery

## 2018-04-13 LAB — CBC WITH DIFFERENTIAL/PLATELET
BASOS ABS: 0 10*3/uL (ref 0.0–0.1)
Basophils Relative: 0 %
EOS PCT: 0 %
Eosinophils Absolute: 0 10*3/uL (ref 0.0–0.7)
HCT: 26.2 % — ABNORMAL LOW (ref 36.0–46.0)
Hemoglobin: 8.6 g/dL — ABNORMAL LOW (ref 12.0–15.0)
LYMPHS ABS: 0.8 10*3/uL (ref 0.7–4.0)
LYMPHS PCT: 7 %
MCH: 29.3 pg (ref 26.0–34.0)
MCHC: 32.8 g/dL (ref 30.0–36.0)
MCV: 89.1 fL (ref 78.0–100.0)
MONO ABS: 0.9 10*3/uL (ref 0.1–1.0)
MONOS PCT: 8 %
NEUTROS ABS: 9.5 10*3/uL — AB (ref 1.7–7.7)
Neutrophils Relative %: 85 %
PLATELETS: 208 10*3/uL (ref 150–400)
RBC: 2.94 MIL/uL — ABNORMAL LOW (ref 3.87–5.11)
RDW: 13.7 % (ref 11.5–15.5)
WBC: 11.2 10*3/uL — ABNORMAL HIGH (ref 4.0–10.5)

## 2018-04-13 LAB — BASIC METABOLIC PANEL
ANION GAP: 7 (ref 5–15)
BUN: 9 mg/dL (ref 6–20)
CALCIUM: 8.1 mg/dL — AB (ref 8.9–10.3)
CO2: 29 mmol/L (ref 22–32)
Chloride: 102 mmol/L (ref 101–111)
Creatinine, Ser: 0.63 mg/dL (ref 0.44–1.00)
GFR calc Af Amer: >60 mL/min (ref >60)
GFR calc non Af Amer: >60 mL/min (ref >60)
GLUCOSE: 144 mg/dL — AB (ref 65–99)
Potassium: 4.1 mmol/L (ref 3.5–5.1)
Sodium: 138 mmol/L (ref 135–145)

## 2018-04-13 LAB — TROPONIN I: Troponin I: <0.03 ng/mL (ref <0.03)

## 2018-04-13 NOTE — Progress Notes (Addendum)
[facility]  Subjective  - Doing very well left foot fells better.   Objective (!) 135/56 72 98 F (36.7 C) 15 100%  Intake/Output Summary (Last 24 hours) at 04/13/2018 0710 Last data filed at 04/13/2018 0400 Gross per 24 hour  Intake 2425 ml  Output 905 ml  Net 1520 ml   Left foot warm with doppler signals PT/DP Left groin soft without hematoma, dry dressings in place Heart irregular rhythm. Lungs non labored breathing Gen NAD    Assessment/Planning: POD # Left fem-below knee pop bypass with propaten  I will transfer her to 4E PT/OT eval and treat D/C foley Lovenox for DVT prophylaxis.  Continue Plavix and Lipitor. Patient unaware of irregular heart rhythm and has appoint in the future to see Cardiology as out patient.  She is going to ask her daughter who this appointment is with.  No CP or SOB.  Roxy Horseman 04/13/2018 7:10 AM --  Laboratory Lab Results: Recent Labs    04/12/18 1249 04/13/18 0417  WBC 9.8 11.2*  HGB 10.4* 8.6*  HCT 32.3* 26.2*  PLT 209 208   BMET Recent Labs    04/12/18 1249 04/13/18 0417  NA 139 138  K 3.8 4.1  CL 103 102  CO2 28 29  GLUCOSE 99 144*  BUN 9 9  CREATININE 0.68 0.63  CALCIUM 8.8* 8.1*    COAG Lab Results  Component Value Date   INR 1.09 04/12/2018   No results found for: PTT

## 2018-04-13 NOTE — Evaluation (Signed)
Occupational Therapy Evaluation and Discharge Patient Details Name: Carla Dawson MRN: [id number] DOB: [birth year] Today's Date: 04/13/2018    History of Present Illness Carla Dawson is a 84 y.o. female admitted with LLE ischemia. Now s/p right fem-below knee pop bypass. PHMx: s/p left SFA atherectomy 20016 in [place], left SFA PTA 3/18, left SFA stent 7/18, redo left SFA stent 4/19. She was recently discharged from the hospital after a right groin complication from her most recent stent requiring right femoral endarterctomy.  She has also had prior right fem pop bypass.     Clinical Impression   This 84 yo female admitted and underwent above presents to acute OT with all education complete with pt and dtr, we will D/C from acute OT.    Follow Up Recommendations  No OT follow up;Supervision/Assistance - 24 hour    Equipment Recommendations  None recommended by OT       Precautions / Restrictions Precautions Precautions: Fall Restrictions Weight Bearing Restrictions: No      Mobility Bed Mobility Overal bed mobility: Modified Independent Bed Mobility: Supine to Sit           General bed mobility comments: increased time, HOB up  Transfers Overall transfer level: Needs assistance Equipment used: Rolling walker (2 wheeled) Transfers: Sit to/from Stand Sit to Stand: Min assist         General transfer comment: initially with posterior lean    Balance Overall balance assessment: Needs assistance Sitting-balance support: No upper extremity supported;Feet supported Sitting balance-Leahy Scale: Good     Standing balance support: Single extremity supported;During functional activity Standing balance-Leahy Scale: Poor                             ADL either performed or assessed with clinical judgement   ADL Overall ADL's : Needs assistance/impaired Eating/Feeding: Independent;Sitting   Grooming: Min guard;Standing   Upper Body  Bathing: Set up;Sitting   Lower Body Bathing: Minimal assistance;Sit to/from stand   Upper Body Dressing : Set up;Sitting   Lower Body Dressing: Minimal assistance;Sit to/from stand Lower Body Dressing Details (indicate cue type and reason): Educated pt and dtr on squence of LBD to make it easier Toilet Transfer: Minimal assistance;Ambulation;RW;Comfort height toilet;Grab bars   Toileting- Clothing Manipulation and Hygiene: Minimal assistance;Sit to/from stand               Vision Patient Visual Report: No change from baseline              Pertinent Vitals/Pain Pain Assessment: 0-10 Pain Score: 10-Worst pain ever("this is good, I thought it would be a 50") Pain Location: LLE with WB'ing/ambulation Pain Descriptors / Indicators: Tender;Aching;Sore Pain Intervention(s): Monitored during session;Premedicated before session;Repositioned     Hand Dominance Right   Extremity/Trunk Assessment Upper Extremity Assessment Upper Extremity Assessment: Overall WFL for tasks assessed   Lower Extremity Assessment Lower Extremity Assessment: Defer to PT evaluation       Communication Communication Communication: No difficulties   Cognition Arousal/Alertness: Awake/alert Behavior During Therapy: WFL for tasks assessed/performed Overall Cognitive Status: Within Functional Limits for tasks assessed                                                Home Living Family/patient expects to be discharged to:: Private residence Living Arrangements:  Children Available Help at Discharge: Family;Available 24 hours/day(dtr) Type of Home: House(dtr's) Home Access: Stairs to enter Entrance Stairs-Number of Steps: 3-4 Entrance Stairs-Rails: Right Home Layout: One level     Bathroom Shower/Tub: Occupational psychologist: Standard Bathroom Accessibility: No How Accessible: Accessible via wheelchair Home Equipment: [place] - 4 wheels;Shower seat;Hand held shower  head          Prior Functioning/Environment Level of Independence: Independent        Comments: stopped driving 3 YA d/t VI, has a fit bit and gets 5000-6000 steps daily in home, but does not AMB outside of house more than once weekly. Limited by COPD, but no recent falls or RW use         OT Problem List: Decreased range of motion;Pain;Impaired balance (sitting and/or standing)         OT Goals(Current goals can be found in the care plan section) Acute Rehab OT Goals Patient Stated Goal: be independent again  OT Frequency:                AM-PAC PT "6 Clicks" Daily Activity     Outcome Measure Help from another person eating meals?: None Help from another person taking care of personal grooming?: A Little Help from another person toileting, which includes using toliet, bedpan, or urinal?: A Little Help from another person bathing (including washing, rinsing, drying)?: A Little Help from another person to put on and taking off regular upper body clothing?: A Little Help from another person to put on and taking off regular lower body clothing?: A Little 6 Click Score: 19   End of Session Equipment Utilized During Treatment: Gait belt;Rolling walker Nurse Communication: Mobility status(no need for chair alarm per RN)  Activity Tolerance: Patient tolerated treatment well Patient left: in chair;with call bell/phone within reach;with family/visitor present  OT Visit Diagnosis: Unsteadiness on feet (R26.81);Pain Pain - Right/Left: Left Pain - part of body: Leg                Time: 0258-5277 OT Time Calculation (min): 30 min Charges:  OT General Charges $OT Visit: 1 Visit OT Evaluation $OT Eval Moderate Complexity: 1 Mod OT Treatments $Self Care/Home Management : 8-22 mins [place], OTR/L [phone] 04/13/2018

## 2018-04-13 NOTE — Progress Notes (Signed)
Pt transferred to 4E01 with belongings - receiving RN and NT at bedside. Pt updated her daughter.

## 2018-04-13 NOTE — Evaluation (Signed)
Physical Therapy Evaluation Patient Details Name: Carla Dawson MRN: [id number] DOB: 04/30/1931 Today's Date: 04/13/2018   History of Present Illness  Mariana Wiederholt is a 84 y.o. female admitted with LLE ischemia. Now s/p right fem-below knee pop bypass. PHMx: s/p left SFA atherectomy 20016 in [place], left SFA PTA 3/18, left SFA stent 7/18, redo left SFA stent 4/19. She was recently discharged from the hospital after a right groin complication from her most recent stent requiring right femoral endarterctomy.  She has also had prior right fem pop bypass.    Clinical Impression  Pt is progressing well with functional mobility but demonstrates deficits regarding gait imbalances and instability with gait. Pt also demonstrates decreased strength in the RLE compared to the LLE and decreased RLE DF AROM. Pt would benefit from acute PT to improve functional mobility level, increase independence, and decrease fall risk.PT recommends that pt should demonstrate ability to navigate 3-4 stairs prior to D/C to ensure accessibility of home environment.    Follow Up Recommendations Home health PT    Equipment Recommendations  None recommended by PT    Recommendations for Other Services       Precautions / Restrictions Precautions Precautions: Fall Precaution Comments: watch SpO2 Restrictions Weight Bearing Restrictions: No      Mobility  Bed Mobility Overal bed mobility: Modified Independent Bed Mobility: Supine to Sit       Sit to supine: Modified independent (Device/Increase time)   General bed mobility comments: in chair on arrival. Supervision due to line management and impulsivity of pt to attempt to stand from chair at beginning of session  Transfers Overall transfer level: Modified independent Equipment used: Rolling walker (2 wheeled)(Pt began to push up from RW but remembered to reach RUE back for chair to stand) Transfers: Sit to/from Stand Sit to Stand:  Modified independent (Device/Increase time)         General transfer comment: Pt performed sit>stand from recliner chair with RW with increased time taken to stand fully upright.   Ambulation/Gait Ambulation/Gait assistance: Min guard Ambulation Distance (Feet): 200 Feet Assistive device: Rolling walker (2 wheeled) Gait Pattern/deviations: Step-through pattern;Decreased step length - right;Decreased dorsiflexion - right   Gait velocity interpretation: <1.8 ft/sec, indicate of risk for recurrent falls General Gait Details: Pt exhibited RLE foot flat strike until cueing to increase DF to land on R heel. Pt then able to demonstrate heel strike with minimal reminders. Pt HR ranged from 97-126 due to occasional irregular rhythm but HR quickly dropped to 100-112 range after reaching 120s. O2 desat periodically on 2LO2 but waveform on pulse Ox was inconsistent and pt was asymptomatic. O2 flow rate was decreased to 1 L via [place] at the end of ambulation and pt tolerated the change well  Stairs            Wheelchair Mobility    Modified Rankin (Stroke Patients Only)       Balance Overall balance assessment: Needs assistance Sitting-balance support: No upper extremity supported;Feet supported Sitting balance-Leahy Scale: Good     Standing balance support: Bilateral upper extremity supported Standing balance-Leahy Scale: Poor Standing balance comment: Pt able to maintain standing using RW and periodically remove BUE without LOB                             Pertinent Vitals/Pain Pain Assessment: No/denies pain     Home Living Family/patient expects to be discharged to:: Private residence Living  Arrangements: Children Available Help at Discharge: Family;Available 24 hours/day(dtr) Type of Home: House(dtr's) Home Access: Stairs to enter Entrance Stairs-Rails: Right Entrance Stairs-Number of Steps: 3-4 Home Layout: One level Home Equipment: Walker - 4 wheels;Shower  seat;Hand held shower head      Prior Function Level of Independence: Independent         Comments: stopped driving , has a fit bit and gets 5000-6000 steps daily in home, but does not AMB outside of house more than once weekly. Limited by COPD, but no recent falls or RW use      Hand Dominance   Dominant Hand: Right    Extremity/Trunk Assessment   Upper Extremity Assessment Upper Extremity Assessment: Overall WFL for tasks assessed    Lower Extremity Assessment Lower Extremity Assessment: RLE deficits/detail RLE Deficits / Details: Decreased DF and increased fatigue compared to LLE    Cervical / Trunk Assessment Cervical / Trunk Assessment: Normal  Communication   Communication: No difficulties  Cognition Arousal/Alertness: Awake/alert Behavior During Therapy: WFL for tasks assessed/performed Overall Cognitive Status: Within Functional Limits for tasks assessed                                        General Comments      Exercises General Exercises - Lower Extremity Ankle Circles/Pumps: AROM;Right;10 reps;Both(Dec RLE AROM) Long Arc Quad: AROM;10 reps;Right;Seated   Assessment/Plan    PT Assessment Patient needs continued PT services  PT Problem List Decreased strength;Decreased activity tolerance;Decreased balance;Decreased mobility;Cardiopulmonary status limiting activity       PT Treatment Interventions DME instruction;Gait training;Functional mobility training;Therapeutic activities;Therapeutic exercise;Balance training;Patient/family education;Stair training    PT Goals (Current goals can be found in the Care Plan section)  Acute Rehab PT Goals Patient Stated Goal: be independent again PT Goal Formulation: With patient Time For Goal Achievement: 04/27/18 Potential to Achieve Goals: Good    Frequency Min 3X/week   Barriers to discharge        Co-evaluation               AM-PAC PT "6 Clicks" Daily Activity  Outcome  Measure Difficulty turning over in bed (including adjusting bedclothes, sheets and blankets)?: None Difficulty moving from lying on back to sitting on the side of the bed? : None Difficulty sitting down on and standing up from a chair with arms (e.g., wheelchair, bedside commode, etc,.)?: A Little Help needed moving to and from a bed to chair (including a wheelchair)?: None Help needed walking in hospital room?: A Little Help needed climbing 3-5 steps with a railing? : A Little 6 Click Score: 21    End of Session Equipment Utilized During Treatment: Gait belt;Oxygen(2LO2; Decreased to 1 LO2) Activity Tolerance: Patient tolerated treatment well;Treatment limited secondary to medical complications (Comment)(O2 Sat reading inconsistent) Patient left: in bed;with call bell/phone within reach Nurse Communication: Mobility status(Via Whiteboard) PT Visit Diagnosis: Other abnormalities of gait and mobility (R26.89);Unsteadiness on feet (R26.81)    Time: 9983-3825 PT Time Calculation (min) (ACUTE ONLY): 31 min   Charges:   PT Evaluation $PT Eval Moderate Complexity: 1 Mod PT Treatments $Gait Training: 8-22 mins   PT G Codes:        Gabe Jousha Schwandt, SPT  [employer] 04/13/2018, 12:06 PM

## 2018-04-13 NOTE — Progress Notes (Signed)
Pt arrived to 4e from 2h. Vitals obtained. Telemetry box applied and CCMD notified x2. Pt oriented to room and staff. Belongings at bedside. Incisions assessed. Will continue current plan of care.   Ara Kussmaul BSN, RN

## 2018-04-13 NOTE — Plan of Care (Signed)
  Problem: Education: Goal: Knowledge of General Education information will improve Outcome: Progressing   Problem: Health Behavior/Discharge Planning: Goal: Ability to manage health-related needs will improve Outcome: Progressing   Problem: Clinical Measurements: Goal: Ability to maintain clinical measurements within normal limits will improve Outcome: Progressing Goal: Will remain free from infection Outcome: Progressing Goal: Diagnostic test results will improve Outcome: Progressing Goal: Respiratory complications will improve Outcome: Progressing Goal: Cardiovascular complication will be avoided Outcome: Progressing   Problem: Activity: Goal: Risk for activity intolerance will decrease Outcome: Progressing   Problem: Nutrition: Goal: Adequate nutrition will be maintained Outcome: Progressing   Problem: Coping: Goal: Level of anxiety will decrease Outcome: Progressing   Problem: Elimination: Goal: Will not experience complications related to bowel motility Outcome: Progressing Goal: Will not experience complications related to urinary retention Outcome: Progressing   Problem: Pain Managment: Goal: General experience of comfort will improve Outcome: Progressing   Problem: Safety: Goal: Ability to remain free from injury will improve Outcome: Progressing   Problem: Skin Integrity: Goal: Risk for impaired skin integrity will decrease Outcome: Progressing   Problem: Education: Goal: Knowledge of the prescribed therapeutic regimen will improve Outcome: Progressing   Problem: Bowel/Gastric: Goal: Gastrointestinal status for postoperative course will improve Outcome: Progressing   Problem: Cardiac: Goal: Ability to maintain an adequate cardiac output will improve Outcome: Progressing   Problem: Clinical Measurements: Goal: Postoperative complications will be avoided or minimized Outcome: Progressing   Problem: Respiratory: Goal: Respiratory status will  improve Outcome: Progressing   Problem: Skin Integrity: Goal: Demonstration of wound healing without infection will improve Outcome: Progressing   Problem: Urinary Elimination: Goal: Ability to achieve and maintain adequate renal perfusion and functioning will improve Outcome: Progressing

## 2018-04-13 NOTE — Progress Notes (Signed)
PA Matt with vascular team notified about pt's SVT. Will await new orders.    Ara Kussmaul BSN, RN

## 2018-04-13 NOTE — Progress Notes (Signed)
Pt's HR 155. BP 138/83. Pt in bed eating pudding, asymptomatic. EKG obtained. IV lopressor given. Pt's HR  Down to 85. 2nd EKG obtained, which shows sinus rhythm with frequent PACs. Will notify MD and continue to monitor pt.   Ara Kussmaul BSN, RN

## 2018-04-13 NOTE — Consult Note (Signed)
            [facility] CM Primary Care Navigator  04/13/2018  Carla Dawson 09-Nov-1931 [id number]   Attempt to seepatient at the bedside to identify possible discharge needsbutshe was already dischargedhome on 04/10/18 with home health services.  Patient was seen and treated for peripheral artery occlusion, ischemia of right extremity, underwent aortogram.  Primary care provider's officeis listed as providingtransition of care (TOC)follow-up.   Patient has discharge instruction to follow-up with vascular surgery in 2 weeks.   For additional questions please contact:  Edwena Felty A. Johnston Maddocks, BSN, RN-BC [facility] PRIMARY CARE Navigator Cell: [phone]

## 2018-04-14 ENCOUNTER — Other Ambulatory Visit (HOSPITAL_COMMUNITY): Payer: Self-pay | Admitting: Vascular Surgery

## 2018-04-14 ENCOUNTER — Encounter (HOSPITAL_COMMUNITY): Payer: Medicare Other

## 2018-04-14 ENCOUNTER — Other Ambulatory Visit: Payer: Self-pay

## 2018-04-14 ENCOUNTER — Telehealth: Payer: Self-pay | Admitting: Vascular Surgery

## 2018-04-14 LAB — TROPONIN I: Troponin I: <0.03 ng/mL (ref <0.03)

## 2018-04-14 MED ORDER — OXYCODONE HCL 5 MG PO TABS: 5.0000 mg | ORAL_TABLET | ORAL | 0 refills | Status: DC | PRN

## 2018-04-14 MED ORDER — OXYCODONE-ACETAMINOPHEN 5-325 MG PO TABS: 1.0000 | ORAL_TABLET | Freq: Four times a day (QID) | ORAL | 0 refills | Status: DC | PRN

## 2018-04-14 NOTE — Telephone Encounter (Signed)
Sched appt 05/07/18; lab at 3:00 and MD at 4:00. Lm on hm# to inform pt of appt.

## 2018-04-14 NOTE — Care Management Note (Signed)
Case Management Note Marvetta Gibbons RN, BSN Unit 4E-Case Manager [phone]  Patient Details  Name: Carla Dawson MRN: [id number] Date of Birth: June 16, 1931  Subjective/Objective:   Pt admitted s/p Leftfem-below knee pop bypass               Action/Plan: PTA pt lived at home, was recently discharged on 5/3 with referral to [facility] for HHPT/OT- will resume orders for [facility] services- spoke with Butch Penny at [facility] regarding [facility] needs and pt discharge for today. No further CM needs noted for transition home with family.   Expected Discharge Date:  04/14/18               Expected Discharge Plan:  [place]  In-House Referral:  NA  Discharge planning Services  CM Consult  Post Acute Care Choice:  Home Health, Resumption of Svcs/PTA Provider Choice offered to:  Patient, Adult Children  DME Arranged:    DME Agency:     HH Arranged:  PT, OT [place] Agency:  [place]  Status of Service:  Completed, signed off  If discussed at [place] of Stay Meetings, dates discussed:    Discharge Disposition: home/home health   Additional Comments:  [name], RN 04/14/2018, 10:59 AM

## 2018-04-14 NOTE — Progress Notes (Addendum)
[facility]  Subjective  - Doing very well.  She ambulated, voided and tolerating po's.  States left leg feels better other than soreness at incisions.   Objective (!) 112/30 73 97.9 F (36.6 C) (Oral) 16 94%  Intake/Output Summary (Last 24 hours) at 04/14/2018 0718 Last data filed at 04/13/2018 2127 Gross per 24 hour  Intake 150 ml  Output 200 ml  Net -50 ml    Dopplers brisk B DP/PT, feet warm to touch. Left leg incisions healing well with min. Ecchymosis, right groin healing well without erythema min. Edema. Heart Irregular rate Lungs non labored breathing   Assessment/Planning: POD # 2 Left fem-below knee pop bypass with propaten Patent left bypass patent, Patent left SFA sent with brisk doppler signals. Patent right Right common femoral endarterectomy with bovine pericardial patch angioplasty    She is ambulatory and states her legs feel better.  Plan for discharge home today with family  We will order ABI's for her first post op visit if they don't get done today before she goes home.   She will continue to take Plavix and Lipitor daily. She will be seen by DR. Jhamari Markowicz in 2 weeks then resume care with Dr. Trula Slade for surveillance.      Roxy Horseman 04/14/2018 7:18 AM -- Agree with above.  Incisions healing patent bypass D/c home Has follow up with cardiology next week regarding arrhythmia Blood loss anemia no obvious hematoma asymptomatic  Ruta Hinds, MD [facility]: [phone] Pager: [phone]  Laboratory Lab Results: Recent Labs    04/12/18 1249 04/13/18 0417  WBC 9.8 11.2*  HGB 10.4* 8.6*  HCT 32.3* 26.2*  PLT 209 208   BMET Recent Labs    04/12/18 1249 04/13/18 0417  NA 139 138  K 3.8 4.1  CL 103 102  CO2 28 29  GLUCOSE 99 144*  BUN 9 9  CREATININE 0.68 0.63  CALCIUM 8.8* 8.1*    COAG Lab Results  Component Value Date   INR 1.09 04/12/2018    No results found for: PTT

## 2018-04-14 NOTE — Telephone Encounter (Signed)
-----   Message from Mena Goes, RN sent at 04/14/2018  2:01 PM EDT ----- Regarding: 2 weeks postop, cancel 5-13 appt   ----- Message ----- From: Ulyses Amor, PA-C Sent: 04/14/2018   7:42 AM To: Vvs Charge Pool  F/U with Dr. Oneida Alar in 2 weeks s/p left LE by pass.  Cancel the appointment with [facility] on the 72 th.  Needs new ABI's if they are not done prior to discharge today 04/14/2018.  S/P emergency left fem-pop by Dr. Oneida Alar.

## 2018-04-14 NOTE — Discharge Instructions (Signed)
 [facility]  Discharge instructions  Lower Extremity Bypass Surgery  Please refer to the following instruction for your post-procedure care. Your surgeon or physician assistant will discuss any changes with you.  Activity  You are encouraged to walk as much as you can. You can slowly return to normal activities during the month after your surgery. Avoid strenuous activity and heavy lifting until your doctor tells you it's OK. Avoid activities such as vacuuming or swinging a golf club. Do not drive until your doctor give the OK and you are no longer taking prescription pain medications. It is also normal to have difficulty with sleep habits, eating and bowel movement after surgery. These will go away with time.  Bathing/Showering  You may shower after you go home. Do not soak in a bathtub, hot tub, or swim until the incision heals completely.  Incision Care  Clean your incision with mild soap and water. Shower every day. Pat the area dry with a clean towel. You do not need a bandage unless otherwise instructed. Do not apply any ointments or creams to your incision. If you have open wounds you will be instructed how to care for them or a visiting nurse may be arranged for you. If you have staples or sutures along your incision they will be removed at your post-op appointment. You may have skin glue on your incision. Do not peel it off. It will come off on its own in about one week. If you have a great deal of moisture in your groin, use a gauze help keep this area dry.  Diet  Resume your normal diet. There are no special food restrictions following this procedure. A low fat/ low cholesterol diet is recommended for all patients with vascular disease. In order to heal from your surgery, it is CRITICAL to get adequate nutrition. Your body requires vitamins, minerals, and protein. Vegetables are the best source of vitamins and minerals. Vegetables also provide the  perfect balance of protein. Processed food has little nutritional value, so try to avoid this.  Medications  Resume taking all your medications unless your doctor or nurse practitioner tells you not to. If your incision is causing pain, you may take over-the-counter pain relievers such as acetaminophen (Tylenol). If you were prescribed a stronger pain medication, please aware these medication can cause nausea and constipation. Prevent nausea by taking the medication with a snack or meal. Avoid constipation by drinking plenty of fluids and eating foods with high amount of fiber, such as fruits, vegetables, and grains. Take Colase 100 mg (an over-the-counter stool softener) twice a day as needed for constipation. Do not take Tylenol if you are taking prescription pain medications.  Follow Up  Our office will schedule a follow up appointment 2-3 weeks following discharge.  Please call us immediately for any of the following conditions  Severe or worsening pain in your legs or feet while at rest or while walking Increase pain, redness, warmth, or drainage (pus) from your incision site(s) Fever of 101 degree or higher The swelling in your leg with the bypass suddenly worsens and becomes more painful than when you were in the hospital If you have been instructed to feel your graft pulse then you should do so every day. If you can no longer feel this pulse, call the office immediately. Not all patients are given this instruction.  Leg swelling is common after leg bypass surgery.  The swelling should improve over a few months   following surgery. To improve the swelling, you may elevate your legs above the level of your heart while you are sitting or resting. Your surgeon or physician assistant may ask you to apply an ACE wrap or wear compression (TED) stockings to help to reduce swelling.  Reduce your risk of vascular disease  Stop smoking. If you would like help call [facility] at 1-800-QUIT-NOW  (1-800-784-8669) or [place] at 336-586-4000.  Manage your cholesterol Maintain a desired weight Control your diabetes weight Control your diabetes Keep your blood pressure down  If you have any questions, please call the office at [phone]   

## 2018-04-15 ENCOUNTER — Other Ambulatory Visit: Payer: Self-pay

## 2018-04-16 DIAGNOSIS — E785 Hyperlipidemia, unspecified: Secondary | ICD-10-CM | POA: Diagnosis not present

## 2018-04-16 DIAGNOSIS — M19042 Primary osteoarthritis, left hand: Secondary | ICD-10-CM | POA: Diagnosis not present

## 2018-04-16 DIAGNOSIS — Z7982 Long term (current) use of aspirin: Secondary | ICD-10-CM | POA: Diagnosis not present

## 2018-04-16 DIAGNOSIS — T82856D Stenosis of peripheral vascular stent, subsequent encounter: Secondary | ICD-10-CM | POA: Diagnosis not present

## 2018-04-16 DIAGNOSIS — I739 Peripheral vascular disease, unspecified: Secondary | ICD-10-CM | POA: Diagnosis not present

## 2018-04-16 DIAGNOSIS — Z9181 History of falling: Secondary | ICD-10-CM | POA: Diagnosis not present

## 2018-04-16 DIAGNOSIS — Z87891 Personal history of nicotine dependence: Secondary | ICD-10-CM | POA: Diagnosis not present

## 2018-04-16 DIAGNOSIS — G629 Polyneuropathy, unspecified: Secondary | ICD-10-CM | POA: Diagnosis not present

## 2018-04-16 DIAGNOSIS — Z8701 Personal history of pneumonia (recurrent): Secondary | ICD-10-CM | POA: Diagnosis not present

## 2018-04-16 DIAGNOSIS — M19041 Primary osteoarthritis, right hand: Secondary | ICD-10-CM | POA: Diagnosis not present

## 2018-04-16 DIAGNOSIS — I10 Essential (primary) hypertension: Secondary | ICD-10-CM | POA: Diagnosis not present

## 2018-04-16 DIAGNOSIS — J449 Chronic obstructive pulmonary disease, unspecified: Secondary | ICD-10-CM | POA: Diagnosis not present

## 2018-04-16 DIAGNOSIS — Z7902 Long term (current) use of antithrombotics/antiplatelets: Secondary | ICD-10-CM | POA: Diagnosis not present

## 2018-04-20 ENCOUNTER — Ambulatory Visit: Payer: Medicare Other | Admitting: Surgery

## 2018-04-20 DIAGNOSIS — M19041 Primary osteoarthritis, right hand: Secondary | ICD-10-CM | POA: Diagnosis not present

## 2018-04-20 DIAGNOSIS — G629 Polyneuropathy, unspecified: Secondary | ICD-10-CM | POA: Diagnosis not present

## 2018-04-20 DIAGNOSIS — T82856D Stenosis of peripheral vascular stent, subsequent encounter: Secondary | ICD-10-CM | POA: Diagnosis not present

## 2018-04-20 DIAGNOSIS — I739 Peripheral vascular disease, unspecified: Secondary | ICD-10-CM | POA: Diagnosis not present

## 2018-04-20 DIAGNOSIS — I10 Essential (primary) hypertension: Secondary | ICD-10-CM | POA: Diagnosis not present

## 2018-04-20 DIAGNOSIS — J449 Chronic obstructive pulmonary disease, unspecified: Secondary | ICD-10-CM | POA: Diagnosis not present

## 2018-04-22 DIAGNOSIS — M19041 Primary osteoarthritis, right hand: Secondary | ICD-10-CM | POA: Diagnosis not present

## 2018-04-22 DIAGNOSIS — J449 Chronic obstructive pulmonary disease, unspecified: Secondary | ICD-10-CM | POA: Diagnosis not present

## 2018-04-22 DIAGNOSIS — T82856D Stenosis of peripheral vascular stent, subsequent encounter: Secondary | ICD-10-CM | POA: Diagnosis not present

## 2018-04-22 DIAGNOSIS — I10 Essential (primary) hypertension: Secondary | ICD-10-CM | POA: Diagnosis not present

## 2018-04-22 DIAGNOSIS — I739 Peripheral vascular disease, unspecified: Secondary | ICD-10-CM | POA: Diagnosis not present

## 2018-04-22 DIAGNOSIS — G629 Polyneuropathy, unspecified: Secondary | ICD-10-CM | POA: Diagnosis not present

## 2018-04-22 NOTE — Discharge Summary (Signed)
[facility] Discharge Summary   Patient ID:  Carla Dawson MRN: [id number] DOB/AGE: 01-27-1931 84 y.o.  Admit date: 04/12/2018 Discharge date: 04/14/2018 Date of Surgery: 04/12/2018 Surgeon: Surgeon(s): Elam Dutch, MD  Admission Diagnosis: Ischemic pain of left foot [phone], I99.9] S/P femoral-popliteal bypass surgery [Z95.828]  Discharge Diagnoses:  Ischemic pain of left foot [O13.086, I99.9] S/P femoral-popliteal bypass surgery [Z95.828]  Secondary Diagnoses: Past Medical History:  Diagnosis Date  . Allergy    "maybe seasonal allergies" (07/08/2017)  . Arthritis    "hands" (07/08/2017)  . Chicken pox   . COPD (chronic obstructive pulmonary disease) ([place])   . High cholesterol    "took me off pills ~ 1-2 months ago" (07/08/2017)  . History of blood transfusion 2017   "when I had blood clot in my leg"  . Hypertension   . PAD (peripheral artery disease) ([place])    severe/notes 10/14/2016  . Pneumonia ~ 2015  . PVD (peripheral vascular disease) ([facility])     Procedure(s): Vein Patch Angioplasty Below Knee Popliteal Artery; Left Femoral Endarterectomy with Profundaplasty; Left Femoral-Below Knee Popliteal Bypass Graft using a 70mm by 80cm Thin Wall Propaten Gortex Graft  Discharged Condition: good  HPI:  Carla Oberry Parkeris a 84 y.o.females/p left SFA atherectomy 20016 in [place], left SFA PTA 3/18, left SFA stent 7/18, redo left SFA stent 4/19 all by Dr Trula Slade.  She reported to the ED 04/12/2018 with sever pain in her left foot.  Assessment Acute ischemia left leg most likely stent occlusion with failure after four prior percutaneous interventions. Redo stent has occluded after 5 days.  She was taken to the OR emergently.         Hospital Course:  Carla Dawson is a 84 y.o. female is S/P Left Procedure(s): Vein Patch Angioplasty Below Knee Popliteal Artery; Left Femoral Endarterectomy with  Profundaplasty; Left Femoral-Below Knee Popliteal Bypass Graft using a 39mm by 80cm Thin Wall Propaten Gortex Graft.   Consults:  Treatment Team:  Elam Dutch, MD Serafina Mitchell, MD   Post op she was transferred tot he ICU secondary to no 4E step down beds available.  He stay was uneventful with brisk doppler signals left LE PT/DP.   Incision healing well and good mobility with PT/OT. Irregular Heart rhythm noted and monitored.  She has an outpatient appointment with a cardiologist scheduled.    Discharge in stable position with patent by pass.    Significant Diagnostic Studies: CBC RecentLabs       Lab Results  Component Value Date   WBC 11.2 (H) 04/13/2018   HGB 8.6 (L) 04/13/2018   HCT 26.2 (L) 04/13/2018   MCV 89.1 04/13/2018   PLT 208 04/13/2018      BMET Labs(Brief)          Component Value Date/Time   NA 138 04/13/2018 0417   NA 141 04/23/2013   K 4.1 04/13/2018 0417   CL 102 04/13/2018 0417   CO2 29 04/13/2018 0417   GLUCOSE 144 (H) 04/13/2018 0417   BUN 9 04/13/2018 0417   BUN 13 04/23/2013   CREATININE 0.63 04/13/2018 0417   CREATININE 0.76 06/20/2017 1534   CALCIUM 8.1 (L) 04/13/2018 0417   GFRNONAA >60 04/13/2018 0417   GFRAA >60 04/13/2018 0417     COAG Recent[facility]abs       Lab Results  Component Value Date   INR 1.09 04/12/2018       Disposition:  Discharge to :Home  Discharge Instructions    Call MD for:  redness, tenderness, or signs of infection (pain, swelling, bleeding, redness, odor or green/yellow discharge around incision site)   Complete by:  As directed    Call MD for:  severe or increased pain, loss or decreased feeling  in affected limb(s)   Complete by:  As directed    Call MD for:  temperature >100.5   Complete by:  As directed    Resume previous diet   Complete by:  As directed           Allergies as of 04/14/2018      Reactions   Lortab  [hydrocodone-acetaminophen] Shortness Of Breath, Itching   Morphine And Related Other (See Comments)   Unknown per pt.         Medication List    TAKE these medications   albuterol (2.5 MG/3ML) 0.083% nebulizer solution Commonly known as:  PROVENTIL Take 3 mLs (2.5 mg total) by nebulization every 4 (four) hours as needed for wheezing or shortness of breath.   albuterol 108 (90 Base) MCG/ACT inhaler Commonly known as:  PROVENTIL HFA;VENTOLIN HFA INHALE 2 PUFFS BY MOUTH EVERY 4 HOURS AS NEEDED FOR WHEEZING   amLODipine 5 MG tablet Commonly known as:  NORVASC Take 1 tablet (5 mg total) by mouth at bedtime.   atorvastatin 10 MG tablet Commonly known as:  LIPITOR Take 1 tablet (10 mg total) by mouth daily at 6 PM.   budesonide-formoterol 160-4.5 MCG/ACT inhaler Commonly known as:  SYMBICORT Inhale 2 puffs into the lungs 2 (two) times daily.   cetirizine 10 MG tablet Commonly known as:  ZYRTEC Take 10 mg by mouth at bedtime.   clopidogrel 75 MG tablet Commonly known as:  PLAVIX Take 1 tablet (75 mg total) by mouth at bedtime. TAKE 1 TABLET(75 MG) BY MOUTH DAILY   gabapentin 300 MG capsule Commonly known as:  NEURONTIN Take 1 capsule (300 mg total) by mouth at bedtime as needed (for pain).   multivitamin with minerals Tabs tablet Take 1 tablet by mouth every evening.   oxyCODONE 5 MG immediate release tablet Commonly known as:  Oxy IR/ROXICODONE Take 1-2 tablets (5-10 mg total) by mouth every 4 (four) hours as needed for moderate pain.   oxyCODONE-acetaminophen 5-325 MG tablet Commonly known as:  PERCOCET Take 1 tablet by mouth every 6 (six) hours as needed for severe pain.   SOLUBLE FIBER/PROBIOTICS Chew Chew 1 each by mouth daily. gummie Dispersive advantage   STIOLTO RESPIMAT IN Inhale 2 puffs into the lungs daily.   Tiotropium Bromide Monohydrate 2.5 MCG/ACT Aers Commonly known as:  SPIRIVA RESPIMAT Inhale 2 puffs into the lungs daily.       Verbal and written Discharge instructions given to the patient. Wound care per Discharge AVS    Follow-up Information    Elam Dutch, MD Follow up in 2 week(s).   Specialties:  Vascular Surgery, Cardiology Why:  office will call Contact information: [place] [address] [place] Follow up.   Specialty:  [place] Why:  HHPT/OT resumed as previously ordered - they will call you to set up visits Contact information: [address] [place] [address] [phone]           Signed: Roxy Horseman 04/15/2018, 1:51 PM - For VQI Registry use --- Instructions: Press F2 to tab through selections.  Delete question if not applicable.  Post-op:  Wound infection: No    Graft infection: No        Transfusion: No                      If yes, 0 units given New Arrhythmia: No Ipsilateral amputation: [x ] no, [ ]  Minor, [ ]  BKA, [ ]  AKA Discharge patency: [x ] Primary, [ ]  Primary assisted, [ ]  Secondary, [ ]  Occluded Patency judged by: [x ] Dopper only, [ ]  Palpable graft pulse, [ ]  Palpable distal pulse, [ ]  ABI inc. > 0.15, [ ]  Duplex ABI/TBIToday's ABIToday's TBIPrevious ABIPrevious TBI +-------+-----------+-----------+------------+------------+ Right 0.88    0.32                 +-------+-----------+-----------+------------+------------+ Left  0.8    0.39                 +-------+-----------+-----------+------------+------------+   D/C Ambulatory Status: Ambulatory with Assistance  Complications: MI: [x ] No, [ ]  Troponin only, [ ]  EKG or Clinical CHF: No Resp failure: [x ] none, [ ]  Pneumonia, [ ]  Ventilator Chg in renal function: [x ] none, [ ]  Inc. Cr > 0.5, [ ]  Temp. Dialysis, [ ]  Permanent dialysis Stroke: [x ] None, [ ]  Minor, [ ]  Major Return to OR: No             Reason for return to OR: [ ]   Bleeding, [ ]  Infection, [ ]  Thrombosis, [ ]  Revision  Discharge medications: Statin use:  Yes ASA use:  No  for medical reason   Plavix use:  Yes Beta blocker use: No  for medical reason   Coumadin use: No  for medical reason

## 2018-04-23 DIAGNOSIS — J449 Chronic obstructive pulmonary disease, unspecified: Secondary | ICD-10-CM | POA: Diagnosis not present

## 2018-04-23 DIAGNOSIS — M19041 Primary osteoarthritis, right hand: Secondary | ICD-10-CM | POA: Diagnosis not present

## 2018-04-23 DIAGNOSIS — I739 Peripheral vascular disease, unspecified: Secondary | ICD-10-CM | POA: Diagnosis not present

## 2018-04-23 DIAGNOSIS — T82856D Stenosis of peripheral vascular stent, subsequent encounter: Secondary | ICD-10-CM | POA: Diagnosis not present

## 2018-04-23 DIAGNOSIS — I10 Essential (primary) hypertension: Secondary | ICD-10-CM | POA: Diagnosis not present

## 2018-04-23 DIAGNOSIS — G629 Polyneuropathy, unspecified: Secondary | ICD-10-CM | POA: Diagnosis not present

## 2018-04-27 DIAGNOSIS — G629 Polyneuropathy, unspecified: Secondary | ICD-10-CM | POA: Diagnosis not present

## 2018-04-27 DIAGNOSIS — I10 Essential (primary) hypertension: Secondary | ICD-10-CM | POA: Diagnosis not present

## 2018-04-27 DIAGNOSIS — I739 Peripheral vascular disease, unspecified: Secondary | ICD-10-CM | POA: Diagnosis not present

## 2018-04-27 DIAGNOSIS — J449 Chronic obstructive pulmonary disease, unspecified: Secondary | ICD-10-CM | POA: Diagnosis not present

## 2018-04-27 DIAGNOSIS — T82856D Stenosis of peripheral vascular stent, subsequent encounter: Secondary | ICD-10-CM | POA: Diagnosis not present

## 2018-04-27 DIAGNOSIS — M19041 Primary osteoarthritis, right hand: Secondary | ICD-10-CM | POA: Diagnosis not present

## 2018-04-28 DIAGNOSIS — T82856D Stenosis of peripheral vascular stent, subsequent encounter: Secondary | ICD-10-CM | POA: Diagnosis not present

## 2018-04-28 DIAGNOSIS — J449 Chronic obstructive pulmonary disease, unspecified: Secondary | ICD-10-CM | POA: Diagnosis not present

## 2018-04-28 DIAGNOSIS — G629 Polyneuropathy, unspecified: Secondary | ICD-10-CM | POA: Diagnosis not present

## 2018-04-28 DIAGNOSIS — M19041 Primary osteoarthritis, right hand: Secondary | ICD-10-CM | POA: Diagnosis not present

## 2018-04-28 DIAGNOSIS — I739 Peripheral vascular disease, unspecified: Secondary | ICD-10-CM | POA: Diagnosis not present

## 2018-04-28 DIAGNOSIS — I10 Essential (primary) hypertension: Secondary | ICD-10-CM | POA: Diagnosis not present

## 2018-04-30 DIAGNOSIS — I10 Essential (primary) hypertension: Secondary | ICD-10-CM | POA: Diagnosis not present

## 2018-04-30 DIAGNOSIS — T82856D Stenosis of peripheral vascular stent, subsequent encounter: Secondary | ICD-10-CM | POA: Diagnosis not present

## 2018-04-30 DIAGNOSIS — I739 Peripheral vascular disease, unspecified: Secondary | ICD-10-CM | POA: Diagnosis not present

## 2018-04-30 DIAGNOSIS — G629 Polyneuropathy, unspecified: Secondary | ICD-10-CM | POA: Diagnosis not present

## 2018-04-30 DIAGNOSIS — J449 Chronic obstructive pulmonary disease, unspecified: Secondary | ICD-10-CM | POA: Diagnosis not present

## 2018-04-30 DIAGNOSIS — M19041 Primary osteoarthritis, right hand: Secondary | ICD-10-CM | POA: Diagnosis not present

## 2018-05-01 ENCOUNTER — Telehealth: Payer: Self-pay | Admitting: Internal Medicine

## 2018-05-01 DIAGNOSIS — M19041 Primary osteoarthritis, right hand: Secondary | ICD-10-CM | POA: Diagnosis not present

## 2018-05-01 DIAGNOSIS — J449 Chronic obstructive pulmonary disease, unspecified: Secondary | ICD-10-CM | POA: Diagnosis not present

## 2018-05-01 DIAGNOSIS — I739 Peripheral vascular disease, unspecified: Secondary | ICD-10-CM | POA: Diagnosis not present

## 2018-05-01 DIAGNOSIS — I10 Essential (primary) hypertension: Secondary | ICD-10-CM | POA: Diagnosis not present

## 2018-05-01 DIAGNOSIS — G629 Polyneuropathy, unspecified: Secondary | ICD-10-CM | POA: Diagnosis not present

## 2018-05-01 DIAGNOSIS — T82856D Stenosis of peripheral vascular stent, subsequent encounter: Secondary | ICD-10-CM | POA: Diagnosis not present

## 2018-05-01 MED ORDER — BUDESONIDE-FORMOTEROL FUMARATE 160-4.5 MCG/ACT IN AERO: 2.0000 | INHALATION_SPRAY | Freq: Two times a day (BID) | RESPIRATORY_TRACT | 11 refills | Status: DC

## 2018-05-01 MED ORDER — TIOTROPIUM BROMIDE MONOHYDRATE 2.5 MCG/ACT IN AERS: 2.0000 | INHALATION_SPRAY | Freq: Every day | RESPIRATORY_TRACT | 11 refills | Status: DC

## 2018-05-01 NOTE — Telephone Encounter (Signed)
Called [facility] back about inhalers.  LMTCB 05/01/18.   According to last OV 04/03/18-  Plan A = Automatic = stop trelegy for now and start symbicort 160 x 2 puffs then spiriva 2 pffs and 12 hours later just the symbicort 160 x 2 pffs  Current Meds  Medication Sig  . albuterol (PROVENTIL HFA;VENTOLIN HFA) 108 (90 Base) MCG/ACT inhaler INHALE 2 PUFFS BY MOUTH EVERY 4 HOURS AS NEEDED FOR WHEEZING  . albuterol (PROVENTIL) (2.5 MG/3ML) 0.083% nebulizer solution Take 3 mLs (2.5 mg total) by nebulization every 4 (four) hours as needed for wheezing or shortness of breath.  Marland Kitchen amLODipine (NORVASC) 5 MG tablet Take 1 tablet (5 mg total) by mouth at bedtime.  Marland Kitchen atorvastatin (LIPITOR) 10 MG tablet Take 1 tablet (10 mg total) by mouth daily.  . cetirizine (ZYRTEC) 10 MG tablet Take 10 mg by mouth daily.   . clopidogrel (PLAVIX) 75 MG tablet TAKE 1 TABLET(75 MG) BY MOUTH DAILY  . gabapentin (NEURONTIN) 300 MG capsule Take 1 capsule (300 mg total) by mouth at bedtime. (Patient taking differently: Take 300 mg by mouth at bedtime as needed (for pain). )  . Multiple Vitamin (MULTIVITAMIN WITH MINERALS) TABS tablet Take 1 tablet by mouth every evening.   . Probiotic Product (SOLUBLE FIBER/PROBIOTICS) CHEW Chew 1 each by mouth daily.   . [DISCONTINUED] famotidine (PEPCID) 10 MG tablet Take 10 mg by mouth daily as needed for heartburn or indigestion.  . [DISCONTINUED] Fluticasone-Umeclidin-Vilant (TRELEGY ELLIPTA) 100-62.5-25 MCG/INH AEPB

## 2018-05-01 NOTE — Telephone Encounter (Signed)
Refills for Symbicort and Spiriva sent to preferred pharmacy [facility] in [place]. Carla Dawson informed that Patient needs follow up visit to go over meds.  Daughter is out of town and is to call and make an appt once she is in town. Patient is to bring all meds with her to visit.

## 2018-05-01 NOTE — Addendum Note (Signed)
Addended by: Elton Sin on: 05/01/2018 05:01 PM   Modules accepted: Orders

## 2018-05-05 DIAGNOSIS — I739 Peripheral vascular disease, unspecified: Secondary | ICD-10-CM | POA: Diagnosis not present

## 2018-05-05 DIAGNOSIS — T82856D Stenosis of peripheral vascular stent, subsequent encounter: Secondary | ICD-10-CM | POA: Diagnosis not present

## 2018-05-05 DIAGNOSIS — G629 Polyneuropathy, unspecified: Secondary | ICD-10-CM | POA: Diagnosis not present

## 2018-05-05 DIAGNOSIS — M19041 Primary osteoarthritis, right hand: Secondary | ICD-10-CM | POA: Diagnosis not present

## 2018-05-05 DIAGNOSIS — I10 Essential (primary) hypertension: Secondary | ICD-10-CM | POA: Diagnosis not present

## 2018-05-05 DIAGNOSIS — J449 Chronic obstructive pulmonary disease, unspecified: Secondary | ICD-10-CM | POA: Diagnosis not present

## 2018-05-07 ENCOUNTER — Encounter (HOSPITAL_COMMUNITY): Payer: Medicare Other

## 2018-05-07 ENCOUNTER — Encounter: Payer: Medicare Other | Admitting: Vascular Surgery

## 2018-05-08 DIAGNOSIS — T82856D Stenosis of peripheral vascular stent, subsequent encounter: Secondary | ICD-10-CM | POA: Diagnosis not present

## 2018-05-08 DIAGNOSIS — I10 Essential (primary) hypertension: Secondary | ICD-10-CM | POA: Diagnosis not present

## 2018-05-08 DIAGNOSIS — J449 Chronic obstructive pulmonary disease, unspecified: Secondary | ICD-10-CM | POA: Diagnosis not present

## 2018-05-08 DIAGNOSIS — I739 Peripheral vascular disease, unspecified: Secondary | ICD-10-CM | POA: Diagnosis not present

## 2018-05-08 DIAGNOSIS — M19041 Primary osteoarthritis, right hand: Secondary | ICD-10-CM | POA: Diagnosis not present

## 2018-05-08 DIAGNOSIS — G629 Polyneuropathy, unspecified: Secondary | ICD-10-CM | POA: Diagnosis not present

## 2018-05-12 DIAGNOSIS — J449 Chronic obstructive pulmonary disease, unspecified: Secondary | ICD-10-CM | POA: Diagnosis not present

## 2018-05-12 DIAGNOSIS — T82856D Stenosis of peripheral vascular stent, subsequent encounter: Secondary | ICD-10-CM | POA: Diagnosis not present

## 2018-05-12 DIAGNOSIS — I739 Peripheral vascular disease, unspecified: Secondary | ICD-10-CM | POA: Diagnosis not present

## 2018-05-12 DIAGNOSIS — G629 Polyneuropathy, unspecified: Secondary | ICD-10-CM | POA: Diagnosis not present

## 2018-05-12 DIAGNOSIS — I10 Essential (primary) hypertension: Secondary | ICD-10-CM | POA: Diagnosis not present

## 2018-05-12 DIAGNOSIS — M19041 Primary osteoarthritis, right hand: Secondary | ICD-10-CM | POA: Diagnosis not present

## 2018-06-04 ENCOUNTER — Ambulatory Visit (HOSPITAL_COMMUNITY)
Admission: RE | Admit: 2018-06-04 | Discharge: 2018-06-04 | Disposition: A | Discharge status: Home / Self Care | Payer: Medicare Other | Source: Ambulatory Visit | Attending: Surgery | Admitting: Surgery

## 2018-06-04 ENCOUNTER — Ambulatory Visit (INDEPENDENT_AMBULATORY_CARE_PROVIDER_SITE_OTHER): Payer: Self-pay | Admitting: Vascular Surgery

## 2018-06-04 VITALS — BP 133/68 | HR 74 | Temp 97.5°F | Resp 16 | Ht 61.0 in | Wt 109.0 lb

## 2018-06-04 DIAGNOSIS — I70213 Atherosclerosis of native arteries of extremities with intermittent claudication, bilateral legs: Secondary | ICD-10-CM | POA: Diagnosis not present

## 2018-06-04 DIAGNOSIS — I739 Peripheral vascular disease, unspecified: Secondary | ICD-10-CM

## 2018-06-04 DIAGNOSIS — Z95828 Presence of other vascular implants and grafts: Secondary | ICD-10-CM

## 2018-06-04 NOTE — Progress Notes (Signed)
    Postoperative Visit   History of Present Illness   Carla Dawson is a 84 y.o. year old female who presents for postoperative follow-up for: left CFA endarterectomy and femoral to BK popliteal bypass with PTFE by Dr. Oneida Alar on 04/12/18.  This case was done emergently due to acute ischemia of LLE.  Surgical history also signficant for R fem-pop bypass.  The patient's wounds are healed.  The patient notes resolution of left lower extremity rest pain.  The patient is able to complete their activities of daily living and feels like she is back to baseline.  She is taking her aspirin and plavix daily.   For VQI Use Only   PRE-ADM LIVING: Home  AMB STATUS: Ambulatory   Physical Examination   Vitals:   06/04/18 1426  BP: 133/68  Pulse: 74  Resp: 16  Temp: (!) 97.5 F (36.4 C)  TempSrc: Oral  SpO2: 95%  Weight: 109 lb (49.4 kg)  Height: 5\' 1"  (1.549 m)    LLE: Incisions are healed, r ATA by doppler; L DP and PTA by doppler; minimal edema LLE to the level of mid shin   Medical Decision Making   Carla Dawson is a 84 y.o. year old female who presents s/p L CFA endarterectomy and femoral to BK popliteal artery bypass with PTFE on 04/12/18.  . The patient's bypass incisions are well healed  . Patent bypass with B ABIs 0.9 . Continue aspirin and plavix . Encouraged ambulation; elevate LLE when not walking . Follow up with graft surveillance in 3 months with Dr. Army Chaco PA-C [facility] [place] Office: [phone]

## 2018-06-05 ENCOUNTER — Other Ambulatory Visit: Payer: Self-pay

## 2018-06-05 DIAGNOSIS — I70213 Atherosclerosis of native arteries of extremities with intermittent claudication, bilateral legs: Secondary | ICD-10-CM

## 2018-06-05 DIAGNOSIS — I739 Peripheral vascular disease, unspecified: Secondary | ICD-10-CM

## 2018-06-05 DIAGNOSIS — Z95828 Presence of other vascular implants and grafts: Secondary | ICD-10-CM

## 2018-06-09 ENCOUNTER — Other Ambulatory Visit: Payer: Self-pay | Admitting: *Deleted

## 2018-06-09 ENCOUNTER — Telehealth: Payer: Self-pay | Admitting: *Deleted

## 2018-06-09 DIAGNOSIS — R252 Cramp and spasm: Secondary | ICD-10-CM

## 2018-06-09 MED ORDER — GABAPENTIN 100 MG PO CAPS: 100.0000 mg | ORAL_CAPSULE | Freq: Three times a day (TID) | ORAL | 2 refills | Status: AC

## 2018-06-09 NOTE — Telephone Encounter (Signed)
Gabapentin Rx changed to 100mg  TID as needed from 300 mg at HS due to patient's c/o HS dose makes her too sleepy.

## 2018-06-22 ENCOUNTER — Encounter (HOSPITAL_COMMUNITY): Payer: Medicare Other

## 2018-06-22 ENCOUNTER — Ambulatory Visit: Payer: Medicare Other | Admitting: Surgery

## 2018-06-29 ENCOUNTER — Encounter: Payer: Self-pay | Admitting: Internal Medicine

## 2018-06-29 ENCOUNTER — Ambulatory Visit (INDEPENDENT_AMBULATORY_CARE_PROVIDER_SITE_OTHER): Payer: Medicare Other | Admitting: Internal Medicine

## 2018-06-29 VITALS — BP 138/62 | HR 77 | Temp 96.4°F | Ht 61.0 in | Wt 113.2 lb

## 2018-06-29 DIAGNOSIS — J441 Chronic obstructive pulmonary disease with (acute) exacerbation: Secondary | ICD-10-CM | POA: Diagnosis not present

## 2018-06-29 DIAGNOSIS — J449 Chronic obstructive pulmonary disease, unspecified: Secondary | ICD-10-CM | POA: Diagnosis not present

## 2018-06-29 DIAGNOSIS — I779 Disorder of arteries and arterioles, unspecified: Secondary | ICD-10-CM

## 2018-06-29 MED ORDER — AZITHROMYCIN 250 MG PO TABS: ORAL_TABLET | ORAL | 0 refills | Status: DC

## 2018-06-29 MED ORDER — PREDNISONE 10 MG PO TABS
ORAL_TABLET | ORAL | 0 refills | Status: DC
Start: 2018-06-29 — End: 2018-07-20

## 2018-06-29 MED ORDER — METHYLPREDNISOLONE ACETATE 80 MG/ML IJ SUSP
120.0000 mg | Freq: Once | INTRAMUSCULAR | Status: AC
  Administered 2018-06-29: 120 mg via INTRAMUSCULAR

## 2018-06-29 MED ORDER — LEVALBUTEROL HCL 0.63 MG/3ML IN NEBU
1.2500 mg | INHALATION_SOLUTION | Freq: Once | RESPIRATORY_TRACT | Status: AC
  Administered 2018-06-29: 1.25 mg via RESPIRATORY_TRACT

## 2018-06-29 NOTE — Progress Notes (Signed)
Subjective:    Patient ID: Carla Dawson, female    DOB: 12/10/1930,    MRN: [id number]     Brief patient profile:  49 yowf  Quit smoking 2007 with dx of copd in 2002 while living in [place] rx with Advair then Breo since quit smoking referred to pulmonary clinic 10/25/2016 by Dr   Betty Martinique   History of Present Illness  10/25/2016 1st [facility] office visit/ [facility]   Chief Complaint  Patient presents with  . Pulmonary Consult    Referred by Dr. Betty Martinique.  Pt here to est care for COPD. She c/o SOB with walking approx 800 ft on a flat surface, eating and walking up an incline. She has some PND but not coughing much.    MMRC2 = can't walk a nl pace on a flat grade s sob but does fine slow and flat eg walking up to 800 ft - she measures the distances herself to keep tack as also limited by claudication - rarely feels need for saba  rec Work on inhaler technique:  relax and gently blow all the way out then take a nice smooth deep breath back in, triggering the inhaler at same time you start breathing in.  Hold for up to 5 seconds if you can. Blow out thru nose. Rinse and gargle with water when done Plan A = Automatic = Symbicort 160 Take 2 puffs first thing in am and then another 2 puffs about 12 hours later.  Plan B = Backup Only use your albuterol (proair) as a rescue medication Plan C = Crisis - only use your albuterol nebulizer if you first try Plan B and it fails to help > ok to use the nebulizer up to every 4 hours but if start needing it regularly call for immediate appointment   02/27/2017  f/u ov/Wert re:  Copd / 02 prn maint on advair 250 bid and prn saba  Chief Complaint  Patient presents with  . Follow-up    Pt states was hospitalized while in [place] early March 2018. She states she was told that she may have had the beginning of PNA. She states today her breathing is "great". She has occ non prod cough. She was sent home with o2, but has  not used in the past several days.   breathing worse suddenly while in [place] not responsive  To albuterol or 2lpm > admit x 4 days and back to baseline  MMRC2 = can't walk a nl pace on a flat grade s sob but does fine slow and flat  rec Plan A = Automatic = Breo one click each am > take two good deep drags off it but click it just once  Plan B = Backup Only use your albuterol (PROAIR) as a rescue medication Plan C = Crisis - only use your albuterol nebulizer if you first try Plan B         10/14/2017  f/u ov/Wert re:   GOLD III / maint on BREO daily / has 02 / not using  Chief Complaint  Patient presents with  . Follow-up    Pt plans to go to [place] soon for a month and wants to discuss plan to "keep from having to take prednisone".  She states she is using her proair rarely, but has been using her neb recently due to chest tightness.   doe =  MMRC3 = can't walk 100 yards even at a slow pace  at a flat grade s stopping due to sob  Can do HT but not sam's   Very hoarse on breo 100/ poor hfa  Sleeps ok  rec Plan A = Automatic = dulera 100 Take 2 puffs first thing in am and then another 2 puffs about 12 hours later.  Work on inhaler technique: Plan B = Backup Only use your albuterol [employer])  as a rescue medication  Plan C = Crisis - only use your albuterol nebulizer if you first try Plan B and it fails to help > ok to use the nebulizer up to every 4 hours but if start needing it regularly call for immediate appointment Plan D = Deltasone take 6 days if you are having to use plan c and not working great Plan E  = ER, go there if all elso fails      02/04/2018  f/u ov/Wert re:  COPDIII /one flare in Dec 2018 / BREO maint but req one course pred in last 3 m for aecopd  Chief Complaint  Patient presents with  . Follow-up    Last night she was eating and aspirated hamburger. She states that she feels pain in her throat when she takes a deep breath.  Her breathing is unchanged. She is  using her proair 3-4 x per wk on average and rarely uses neb.   Dyspnea:  MMRC3 = can't walk 100 yards even at a slow pace at a flat grade s stopping due to sob   Cough: not normally/  Sleep: ok/ no 02  SABA use:   No more than once a day/ maybe p shower  rec Plan A = Automatic = Breo or Trelegy daily  Work on inhaler technique:   Plan B = Backup Only use your albuterol (Proair)  as a rescue medication  Plan C = Crisis - only use your albuterol nebulizer if you first try Plan B Plan D = Deltasone take 6 days if you are having to use plan c and not working great Plan E  = ER, go there if all elso fails        03/23/2018 acute extended ov/Wert re:  aecopd x 3 days/ did not start Plan D/ tol neb poorly due to tremors Chief Complaint  Patient presents with  . Acute Visit    productive cough yellow mucus, sob ,cough since 4/12   was doing about the same prior to flare 03/20/18 with cough/ congestion/ thick mucus / no fever c/p and comfortable at rest but trouble sleeping due to cough / chest tight  rec zpak  Take your refillable prednisone and remember it is plan = D = Prednisone 10 mg take  4 each am x 2 days,   2 each am x 2 days,  1 each am x 2 days and stop.       04/03/2018 acute extended ov/Wert re:  aecopd  Chief Complaint  Patient presents with  . Acute Visit    Improved slightly after visit 03/23/18 and then worsened again 2-3 days ago- cough with light yellow sputum and increased SOB. She is using her albuterol inhaler 4-5 x per day and neb at least 3 x per day.    on trelegy p zpak / prednisone was only a little  better as it turns out = ok only at rest whereas baseline = walking house x 500 steps at a time s stopping - did not less saba need p prednisone but concerned getting worse  x 3 d prior to OV  And needs vascular sugery 04/07/18 and doesn't want to be turned down  Mucus turned clear on zpak, then slt yellow and thick since despite rx with mucinex rec Depomedrol 120 mg  today  Plan A = Automatic = stop trelegy for now and start symbicort 160 x 2 puffs then spiriva 2 pffs and 12 hours later just the symbicort 160 x 2 pffs Work on inhaler technique:  Plan B = Backup Only use your albuterol as a rescue medication Plan C = Crisis - only use your albuterol nebulizer if you first try Plan B and it fails to help > ok to use the nebulizer up to every 4 hours but if start needing it regularly call for immediate appointment Plan D = Deltasone = prednisone 10 mg 2 daily until all better then 1 daily x 3 days and stop  For cough mucinex dm 1200 mg every 12 hours  Try prilosec otc 20mg   Take 30-60 min before first meal of the day and Pepcid ac (famotidine) 20 mg one @  bedtime until cough is completely gone for at least a week without the need for cough suppression Return here when you come back for [place]     06/29/2018 acute  ov/Wert re: sob/cough c/w aecopd in pt with GOLD III criteria  Chief Complaint  Patient presents with  . Acute Visit    increased SOB, cough with yellow sputum and wheezing for the past 5-6 days. She has started using her neb with albuterol 3 x daily.   did fine in June and most of July 2019 on Symb / spiriva then acutely ill x about a week and did not follow written action plan but started using neb as above with some relief and typically comfortable at rest p neb but doe x across the room assoc the purulent sputum,subj wheezing worse at hs   No obvious patterns in  day to day or daytime variability or assoc  mucus plugs or hemoptysis or cp or chest tightness, subjective wheeze or overt sinus or hb symptoms.    Also denies any obvious fluctuation of symptoms with weather or environmental changes or other aggravating or alleviating factors except as outlined above   No unusual exposure hx or h/o childhood pna/ asthma or knowledge of premature birth.  Current Allergies, Complete Past Medical History, Past Surgical History, Family History, and  Social History were reviewed in [employer] record.  ROS  The following are not active complaints unless bolded Hoarseness, sore throat, dysphagia, dental problems, itching, sneezing,  nasal congestion or discharge of excess mucus or purulent secretions, ear ache,   fever, chills, sweats, unintended wt loss or wt gain, classically pleuritic or exertional cp,  orthopnea pnd or arm/hand swelling  or leg swelling, presyncope, palpitations, abdominal pain, anorexia, nausea, vomiting, diarrhea  or change in bowel habits or change in bladder habits, change in stools or change in urine, dysuria, hematuria,  rash, arthralgias, visual complaints, headache, numbness, weakness or ataxia or problems with walking or coordination,  change in mood or  memory.        Current Meds  Medication Sig  . albuterol (PROVENTIL HFA;VENTOLIN HFA) 108 (90 Base) MCG/ACT inhaler INHALE 2 PUFFS BY MOUTH EVERY 4 HOURS AS NEEDED FOR WHEEZING  . albuterol (PROVENTIL) (2.5 MG/3ML) 0.083% nebulizer solution Take 3 mLs (2.5 mg total) by nebulization every 4 (four) hours as needed for wheezing or shortness of breath.  Marland Kitchen  amLODipine (NORVASC) 5 MG tablet Take 1 tablet (5 mg total) by mouth at bedtime.  Marland Kitchen atorvastatin (LIPITOR) 10 MG tablet Take 1 tablet (10 mg total) by mouth daily at 6 PM.  . budesonide-formoterol (SYMBICORT) 160-4.5 MCG/ACT inhaler Inhale 2 puffs into the lungs 2 (two) times daily.  . cetirizine (ZYRTEC) 10 MG tablet Take 10 mg by mouth at bedtime.   . clopidogrel (PLAVIX) 75 MG tablet Take 1 tablet (75 mg total) by mouth at bedtime. TAKE 1 TABLET(75 MG) BY MOUTH DAILY  . gabapentin (NEURONTIN) 100 MG capsule Take 1 capsule (100 mg total) by mouth 3 (three) times daily.  . Multiple Vitamin (MULTIVITAMIN WITH MINERALS) TABS tablet Take 1 tablet by mouth every evening.   Marland Kitchen oxyCODONE (OXY IR/ROXICODONE) 5 MG immediate release tablet Take 1-2 tablets (5-10 mg total) by mouth every 4 (four) hours as  needed for moderate pain.  Marland Kitchen oxyCODONE-acetaminophen (PERCOCET) 5-325 MG tablet Take 1 tablet by mouth every 6 (six) hours as needed for severe pain.  . Probiotic Product (SOLUBLE FIBER/PROBIOTICS) CHEW Chew 1 each by mouth daily. gummie Dispersive advantage  . Tiotropium Bromide Monohydrate (SPIRIVA RESPIMAT) 2.5 MCG/ACT AERS Inhale 2 puffs into the lungs daily.  .                        Objective:   Physical Exam  amb wf / harsh cough     06/29/2018      113  04/03/2018       110  03/23/2018       111  02/04/2018       109  10/14/2017       108  04/03/2017       100   02/27/2017      96  10/25/16 92 lb 9.6 oz (42 kg)  10/15/16 93 lb 9 oz (42.4 kg)  10/14/16 93 lb (42.2 kg)     Vital signs reviewed - Note on arrival 02 sats  96% on RA              HEENT: nl dentition / oropharynx. Nl external ear canals without cough reflex - mild  bilateral non-specific turbinate edema     NECK :  without JVD/Nodes/TM/ nl carotid upstrokes bilaterally   LUNGS: no acc muscle use,  Mild  barrel  contour chest wall with bilateral insp and exp rhonchi and   Hyperresonant  to  percussion bilaterally     CV:  RRR  no s3 or murmur or increase in P2, and no edema   ABD:  soft and nontender with pos mid insp Hoover's  in the supine position. No bruits or organomegaly appreciated, bowel sounds nl  MS:   Nl gait/  ext warm without deformities, calf tenderness, cyanosis or clubbing No obvious joint restrictions   SKIN: warm and dry without lesions    NEURO:  alert, approp, nl sensorium with  no motor or cerebellar deficits apparent.                  Assessment & Plan:

## 2018-06-29 NOTE — Patient Instructions (Addendum)
Depomedrol 120 mg today   Zpak   Plan A = Automatic = continue  symbicort 160 x 2 puffs then spiriva 2 pffs and 12 hours later just the symbicort 160 x 2 pffs  Work on inhaler technique:  relax and gently blow all the way out then take a nice smooth deep breath back in, triggering the inhaler at same time you start breathing in.  Hold for up to 5 seconds if you can. Blow out thru nose. Rinse and gargle with water when done  Plan B = Backup Only use your albuterol as a rescue medication to be used if you can't catch your breath by resting or doing a relaxed purse lip breathing pattern.  - The less you use it, the better it will work when you need it. - Ok to use the inhaler up to 2 puffs  every 4 hours if you must but call for appointment if use goes up over your usual need - Don't leave home without it !!  (think of it like the spare tire for your car)   Plan C = Crisis - only use your albuterol nebulizer if you first try Plan B and it fails to help > ok to use the nebulizer up to every 4 hours but if start needing it regularly call for immediate appointment   Plan D = Deltasone = prednisone 10 mg 2 daily until all better then 1 daily x 5 days and stop    For cough mucinex dm 1200 mg every 12 hours with the flutter valve  Try prilosec otc 20mg   Take 30-60 min before first meal of the day and Pepcid ac (famotidine) 20 mg one @  bedtime until cough is completely gone for at least a week without the need for cough suppression     Please schedule a follow up office visit in 4 weeks, call sooner if needed with all medications /inhalers/ solutions in hand so we can verify exactly what you are taking. This includes all medications from all doctors and over the [place] separate them into two bags:  the ones you take automatically, no matter what, vs the ones you take just when you feel you need them "BAG #2 is UP TO YOU"  - this will really help [place] help you take your medications more  effectively.  Also bring the flutter valve.

## 2018-06-30 ENCOUNTER — Ambulatory Visit: Payer: Medicare Other | Admitting: Internal Medicine

## 2018-06-30 ENCOUNTER — Encounter: Payer: Self-pay | Admitting: Internal Medicine

## 2018-06-30 NOTE — Assessment & Plan Note (Signed)
See copd / rx zpak plus restart pred at 20 mg daily until better then taper off

## 2018-06-30 NOTE — Assessment & Plan Note (Addendum)
10/25/2016  try symbicort 160 2bid instead  of breo > preferred breo - 02/27/2017  After extensive coaching HFA effectiveness =    25% with hfa/ 75% with dpi so try back to BREO - PFT's  04/03/2017  FEV1 0.59 (37 % ) ratio 34  p 12  % improvement from saba p BREO  prior to study with DLCO  35/35 % corrects to 49  % for alv volume   - 10/14/2017    > try dulera 100 2bid due to hoarseness on breo   - 02/04/18 >  Added prednisone as Plan D - 02/04/2018 trial of trelegy>  Refractory aecopd 04/03/2018 > changed to symb 160/ spiriva 2.5 and pred 20 until better than taper off as Plan D    Acute flare in setting of apparent uri and did not activate plan D even with lots of plan C in use nor did she add the gerd rx I had rec for cough for any reason/ reviewed these all in detail today and rec  1) neb now with xopenxex 1.25 mg > improved  1) Depomedrol 120 / pred 20 per day until better then taper off as above 2) mucienx dm/ flutter valve  3) Zpak 4) GERD rx acutely :  Reviewed: Of the three most common causes of  Sub-acute / recurrent or chronic cough, only one (GERD)  can actually contribute to/ trigger  the other two (asthma and post nasal drip syndrome)  and perpetuate the cylce of cough. While not intuitively obvious, many patients with chronic low grade reflux do not cough until there is a primary insult that disturbs the protective epithelial barrier and exposes sensitive nerve endings.   This is typically viral but can due to PNDS and  either may apply here.   The point is that once this occurs, it is difficult to eliminate the cycle  using anything but a maximally effective acid suppression regimen at least in the short run, accompanied by an appropriate diet to address non acid GERD     F/u in 4 weeks  with all meds in hand using a trust but verify approach to confirm accurate Medication  Reconciliation The principal here is that until we are certain that the  patients are doing what we've asked, it  makes no sense to ask them to do more.    I had an extended discussion with the patient reviewing all relevant studies completed to date and  lasting 25 minutes of a 40  minute acute office visit addressing recurrent  severe non-specific but potentially very serious refractory respiratory symptoms of uncertain and potentially multiple  etiologies.   Each maintenance medication was reviewed in detail including most importantly the difference between maintenance and prns and under what circumstances the prns are to be triggered using an action plan format that is not reflected in the computer generated alphabetically organized AVS.    Please see AVS for specific instructions unique to this office visit that I personally wrote and verbalized to the the pt in detail and then reviewed with pt  by my nurse highlighting any changes in therapy/plan of care  recommended at today's visit.

## 2018-07-01 ENCOUNTER — Other Ambulatory Visit: Payer: Self-pay | Admitting: Internal Medicine

## 2018-07-02 ENCOUNTER — Other Ambulatory Visit: Payer: Self-pay | Admitting: Internal Medicine

## 2018-07-20 ENCOUNTER — Ambulatory Visit (INDEPENDENT_AMBULATORY_CARE_PROVIDER_SITE_OTHER): Payer: Medicare Other | Admitting: Family Medicine

## 2018-07-20 ENCOUNTER — Encounter: Payer: Self-pay | Admitting: Family Medicine

## 2018-07-20 VITALS — BP 124/82 | HR 75 | Temp 98.1°F | Resp 16 | Ht 61.0 in | Wt 108.0 lb

## 2018-07-20 DIAGNOSIS — I779 Disorder of arteries and arterioles, unspecified: Secondary | ICD-10-CM

## 2018-07-20 DIAGNOSIS — R233 Spontaneous ecchymoses: Secondary | ICD-10-CM | POA: Diagnosis not present

## 2018-07-20 DIAGNOSIS — I499 Cardiac arrhythmia, unspecified: Secondary | ICD-10-CM | POA: Diagnosis not present

## 2018-07-20 DIAGNOSIS — M542 Cervicalgia: Secondary | ICD-10-CM | POA: Diagnosis not present

## 2018-07-20 DIAGNOSIS — I10 Essential (primary) hypertension: Secondary | ICD-10-CM | POA: Diagnosis not present

## 2018-07-20 NOTE — Assessment & Plan Note (Signed)
Problem could be aggravated by Plavix and Aspirin. She is not having ecchymosis in other areas and reporting no abnormal bleeding. Continue monitoring it. We discussed some side effects of anticoagulation as well as benefits given her history of severe PAD.

## 2018-07-20 NOTE — Assessment & Plan Note (Addendum)
Adequately controlled. No changes in Amlodipine 5 mg daily. Continue low salt diet. I will see her back in March next year.

## 2018-07-20 NOTE — Progress Notes (Signed)
ACUTE VISIT   HPI:  Chief Complaint  Patient presents with  . Follow-up    Carla Dawson is a 84 y.o. female, who is here today with her daughter requesting a referral to see a cardiologist. According to her daughter, during recent hospitalization she had "some heart issues."   They are not sure about the type of "issue", duration, or frequency.  Her daughter states that [name] has not had a stress test in many years.  According to the daughter, it was recommended at the time of her discharge to follow with cardiologist, referral was not placed.  She states that his has happened during previous hospitalizations.  She states that she does "not feel it" when it happens.  She denies dizziness, chest pain, dyspnea, or diaphoresis.  She is not sure about exacerbating or alleviating factors.  She follows with pulmonologist, Dr. Melvyn Novas.  History of COPD, she is currently on Symbicort and albuterol, the latter one she does not use very often.  She also sees vascular surgeon, PAD, S/P femoral-popliteal bypass surgery Neuropathy due to PAD, currently she is on gabapentin 100 mg 3 times daily.  Ecchymosis on upper extremities, forearms and dorsum of hands. Currently she is on Plavix and Aspirin. She has not noted gum or nose bleeding, gross hematuria, or blood in the stool.  She is also complaining of a "catching" sensation in the neck. For a few months when she turns her head she feels like a cramp on either side of her neck, she holds right sternocleidomastoideus muscle. It is alleviated by local massage, it last a few seconds. She has not noted local edema or erythema. No tenderness upon palpation.  She has not taken any medication OTC.  HTN: Currently she is on amlodipine 5 mg daily. Problem has been stable. She has not check BP at home. No visual changes, headache, or worsening edema. Eye exam within the past year.   Review of Systems  Constitutional:  Negative for activity change, appetite change, fatigue and fever.  HENT: Negative for mouth sores, nosebleeds and trouble swallowing.   Eyes: Negative for redness and visual disturbance.  Respiratory: Positive for shortness of breath (Stable.). Negative for wheezing.   Cardiovascular: Negative for chest pain, palpitations and leg swelling.  Gastrointestinal: Negative for abdominal pain, nausea and vomiting.       Negative for changes in bowel habits.  Endocrine: Negative for cold intolerance and heat intolerance.  Genitourinary: Negative for decreased urine volume and hematuria.  Skin: Negative for rash and wound.  Neurological: Negative for syncope, weakness and headaches.  Hematological: Bruises/bleeds easily.  Psychiatric/Behavioral: Negative for confusion.      Current Outpatient Medications on File Prior to Visit  Medication Sig Dispense Refill  . albuterol (PROVENTIL HFA;VENTOLIN HFA) 108 (90 Base) MCG/ACT inhaler INHALE 2 PUFFS BY MOUTH EVERY 4 HOURS AS NEEDED FOR WHEEZING 8.5 g 0  . albuterol (PROVENTIL) (2.5 MG/3ML) 0.083% nebulizer solution INHALE 1 NEBULE VIA NEBULIZER EVERY 4 HOURS AS NEEDED FOR WHEEZING OR SHORTNESS OF BREATH 300 mL 2  . amLODipine (NORVASC) 5 MG tablet Take 1 tablet (5 mg total) by mouth at bedtime. 90 tablet 3  . atorvastatin (LIPITOR) 10 MG tablet Take 1 tablet (10 mg total) by mouth daily at 6 PM.    . budesonide-formoterol (SYMBICORT) 160-4.5 MCG/ACT inhaler Inhale 2 puffs into the lungs 2 (two) times daily. 1 Inhaler 11  . cetirizine (ZYRTEC) 10 MG tablet Take 10 mg by  mouth at bedtime.     . clopidogrel (PLAVIX) 75 MG tablet Take 1 tablet (75 mg total) by mouth at bedtime. TAKE 1 TABLET(75 MG) BY MOUTH DAILY    . gabapentin (NEURONTIN) 100 MG capsule Take 1 capsule (100 mg total) by mouth 3 (three) times daily. 90 capsule 2  . Multiple Vitamin (MULTIVITAMIN WITH MINERALS) TABS tablet Take 1 tablet by mouth every evening.     Marland Kitchen oxyCODONE (OXY  IR/ROXICODONE) 5 MG immediate release tablet Take 1-2 tablets (5-10 mg total) by mouth every 4 (four) hours as needed for moderate pain. 30 tablet 0  . oxyCODONE-acetaminophen (PERCOCET) 5-325 MG tablet Take 1 tablet by mouth every 6 (six) hours as needed for severe pain. 20 tablet 0  . Probiotic Product (SOLUBLE FIBER/PROBIOTICS) CHEW Chew 1 each by mouth daily. gummie Dispersive advantage    . SPIRIVA RESPIMAT 2.5 MCG/ACT AERS INHALE 2 PUFFS INTO THE LUNGS DAILY 1 Inhaler 11   No current facility-administered medications on file prior to visit.      Past Medical History:  Diagnosis Date  . Allergy    "maybe seasonal allergies" (07/08/2017)  . Arthritis    "hands" (07/08/2017)  . Chicken pox   . COPD (chronic obstructive pulmonary disease) ([place])   . High cholesterol    "took me off pills ~ 1-2 months ago" (07/08/2017)  . History of blood transfusion 2017   "when I had blood clot in my leg"  . Hypertension   . PAD (peripheral artery disease) ([place])    severe/notes 10/14/2016  . Pneumonia ~ 2015  . PVD (peripheral vascular disease) ([facility])    Allergies  Allergen Reactions  . Hydrocodone-Acetaminophen Itching and Shortness Of Breath  . Lortab [Hydrocodone-Acetaminophen] Shortness Of Breath and Itching  . Morphine And Related Other (See Comments)    Unknown per pt.    Social History   Socioeconomic History  . Marital status: Divorced    Spouse name: Not on file  . Number of children: Not on file  . Years of education: Not on file  . Highest education level: Not on file  Occupational History  . Not on file  Social Needs  . Financial resource strain: Not on file  . Food insecurity:    Worry: Not on file    Inability: Not on file  . Transportation needs:    Medical: Not on file    Non-medical: Not on file  Tobacco Use  . Smoking status: Former Smoker    Packs/day: 1.50    Years: 52.00    Pack years: 78.00    Start date: 12/09/1953    Last attempt to quit: 12/09/2005     Years since quitting: 12.6  . Smokeless tobacco: Never Used  Substance and Sexual Activity  . Alcohol use: No  . Drug use: No  . Sexual activity: Not Currently  Lifestyle  . Physical activity:    Days per week: Not on file    Minutes per session: Not on file  . Stress: Not on file  Relationships  . Social connections:    Talks on phone: Not on file    Gets together: Not on file    Attends religious service: Not on file    Active member of club or organization: Not on file    Attends meetings of clubs or organizations: Not on file    Relationship status: Not on file  Other Topics Concern  . Not on file  Social History Narrative  .  Not on file    Vitals:   07/20/18 1125  BP: 124/82  Pulse: 75  Resp: 16  Temp: 98.1 F (36.7 C)  SpO2: 94%   Body mass index is 20.41 kg/m.   Physical Exam  Nursing note and vitals reviewed. Constitutional: She is oriented to person, place, and time. She appears well-developed and well-nourished. No distress.  HENT:  Head: Normocephalic and atraumatic.  Mouth/Throat: Oropharynx is clear and moist and mucous membranes are normal.  Eyes: Conjunctivae are normal.  Neck: No tracheal tenderness and no muscular tenderness present. No tracheal deviation, no edema and no erythema present. No thyroid mass and no thyromegaly present.  Cardiovascular: Normal rate. An irregular rhythm present.  Occasional extrasystoles are present.  No murmur heard. Respiratory: Effort normal and breath sounds normal. No respiratory distress.  GI: Soft. She exhibits no mass. There is no hepatomegaly. There is no tenderness.  Musculoskeletal: She exhibits edema. She exhibits no tenderness.       Cervical back: She exhibits no tenderness and no bony tenderness.  No tenderness upon palpation of sternocleidomastoid muscles bilaterally, no deformity appreciated.  Lymphadenopathy:    She has no cervical adenopathy.  Neurological: She is alert and oriented to person,  place, and time. She has normal strength.  Otherwise stable gait with no assistance.  Skin: Skin is warm. Ecchymosis noted. No rash noted. No erythema.  Psychiatric: She has a normal mood and affect.  Well groomed, good eye contact.    ASSESSMENT AND PLAN:  Ms. Fleda was seen today for follow-up.  Diagnoses and all orders for this visit:  Cardiac arrhythmia, unspecified cardiac arrhythmia type  Reviewing EKG done in 04/2018, PACs are present, normal axis, and QS waves in septal leads.  It seems to be unchanged when compared with prior EKGs. Currently she is asymptomatic. We discussed possible causes, albuterol and caffeine could aggravate problem. Instructed about warning signs. Appointment with cardiologist will be arranged.  -     Ambulatory referral to Cardiology  Neck pain, bilateral  It seems to be muscular pain.  It was not reproducible upon examination today. Recommend topical icy hot or Aspercreme and/or massage as needed. Because risk of side effects I am not recommending muscle relaxants. Follow-up as needed.  Essential hypertension, benign Adequately controlled. No changes in Amlodipine 5 mg daily. Continue low salt diet. I will see her back in March next year.  Senile ecchymosis Problem could be aggravated by Plavix and Aspirin. She is not having ecchymosis in other areas and reporting no abnormal bleeding. Continue monitoring it. We discussed some side effects of anticoagulation as well as benefits given her history of severe PAD.     Shakiara Lukic G. [place], MD  [facility]. [place] office.

## 2018-07-20 NOTE — Patient Instructions (Addendum)
A few things to remember from today's visit:   Cardiac arrhythmia, unspecified cardiac arrhythmia type - Plan: Ambulatory referral to Cardiology   Premature Atrial Contraction A premature atrial contraction [facility]) is a kind of irregular heartbeat (arrhythmia). It happens when the heart beats too early and then pauses before beating again. PACs are also called skipped heartbeats because they may make you feel like your heart is stopping for a second, even though the heart does not actually skip a beat. The heart has four areas, or chambers. Normally, electrical signals spread across the heart and make all the chambers beat together. During a PAC, the upper chambers of the heart (right atrium and left atrium) beat too early, before they have had time to fill with blood. The heartbeat pauses afterward so the heart can fill with blood for the next beat. What are the causes? The cause of this condition is often unknown. Sometimes it is caused by heart disease or injury to the heart. What increases the risk? This condition is more likely to develop in adults who are 30 years of age or older and in children. Episodes may be triggered by:  Caffeine.  Stress.  Tiredness.  Alcohol.  Smoking.  Stimulant drugs.  Heart disease.  What are the signs or symptoms? Symptoms of this condition include:  A feeling that your heart skipped a beat. The first heartbeat after the "skipped" beat may feel more forceful.  A feeling that your heart is fluttering.  How is this diagnosed? This condition is diagnosed based on:  Your symptoms.  A physical exam. Your health care provider may listen to your heart.  Tests to rule out other conditions, such as a test that records the electrical impulses of the heart and assesses heart health (electrocardiogram, or ECG). If you have an ECG, you may need to wear a portable ECG machine (Holter monitor) that records your heart beats for 24 hours or more.  How is  this treated?  Usually, treatment is not needed for this condition. If you have episodes that happen often or if a cause is found, you may receive treatment for the underlying cause of your PACs. Follow these instructions at home: Lifestyle Follow these instructions as told by your health care provider:  Do not use any products that contain nicotine or tobacco, such as cigarettes and e-cigarettes. If you need help quitting, ask your health care provider.  If caffeine triggers episodes, do not eat, drink, or use anything with caffeine in it.  If caffeine does not seem to trigger episodes, consume caffeine in moderation.  If alcohol triggers episodes of PAC, do not drink alcohol.  If alcohol does not seem to trigger episodes, limit alcohol intake to no more than 1 drink a day for nonpregnant women and 2 drinks a day for men. One drink equals 12 oz of beer, 5 oz of wine, or 1 oz of hard liquor.  Exercise regularly. Ask your health care provider what type of exercise is safe for you.  Find healthy ways to manage stress. Avoid stressful situations when possible.  Try to get at least 7-9 hours of sleep each night, or as much as recommended by your health care provider.  Do not use illegal drugs.  General instructions  Take over-the-counter and prescription medicines only as told by your health care provider.  Keep all follow-up visits as told by your health care provider. This is important. Contact a health care provider if:  You feel your heart  skipping beats more than once a day.  Your heart skips beats and you feel dizzy, light-headed, or very tired. Get help right away if:  You have chest pain.  You have trouble breathing. This information is not intended to replace advice given to you by your health care provider. Make sure you discuss any questions you have with your health care provider. Document Released: 07/29/2014 Document Revised: 07/23/2016 Document Reviewed:  05/24/2016 Elsevier Interactive Patient Education  [employer]. Please be sure medication list is accurate. If a new problem present, please set up appointment sooner than planned today.

## 2018-07-28 ENCOUNTER — Telehealth: Payer: Self-pay | Admitting: Internal Medicine

## 2018-07-28 NOTE — Telephone Encounter (Signed)
Called and spoke with [employer] at phone [phone] spoke with [place] She advised that patient doesn't have drug coverage on current plan at this time The symbicort 160 is not covered at this time  Called and spoke with pt's daughter Carla Dawson regarding symb 160 coverage She is aware they may be issues with the script, has appt in office tomorrow They will discuss concerns with MW tomorrow Nothing further needed.

## 2018-07-28 NOTE — Telephone Encounter (Signed)
Called and spoke with [employer] at phone [phone] spoke with [place] She advised that patient doesn't have drug coverage on current plan at this time The symbicort 160 is not covered at this time  Called and spoke with pt's daughter [name] regarding symb 160 coverage She is aware they may be issues with the script, has appt in office tomorrow They will discuss concerns with MW tomorrow Nothing further needed.

## 2018-07-28 NOTE — Telephone Encounter (Signed)
PA was completed via CMM at request of [place]. [employer] [place] called back regarding below.

## 2018-07-28 NOTE — Telephone Encounter (Signed)
[employer] [place] Beau [phone] key# [id number])..Symbicort was denied.

## 2018-07-28 NOTE — Telephone Encounter (Signed)
Will close this encounter since patient has an appt with MW tomorrow to discuss alternative medications.

## 2018-07-29 ENCOUNTER — Ambulatory Visit (INDEPENDENT_AMBULATORY_CARE_PROVIDER_SITE_OTHER): Payer: Medicare Other | Admitting: Internal Medicine

## 2018-07-29 ENCOUNTER — Encounter: Payer: Self-pay | Admitting: Internal Medicine

## 2018-07-29 VITALS — BP 140/72 | HR 81 | Ht 61.0 in | Wt 112.0 lb

## 2018-07-29 DIAGNOSIS — I779 Disorder of arteries and arterioles, unspecified: Secondary | ICD-10-CM | POA: Diagnosis not present

## 2018-07-29 DIAGNOSIS — J449 Chronic obstructive pulmonary disease, unspecified: Secondary | ICD-10-CM | POA: Diagnosis not present

## 2018-07-29 MED ORDER — BUDESONIDE-FORMOTEROL FUMARATE 160-4.5 MCG/ACT IN AERO: 2.0000 | INHALATION_SPRAY | Freq: Two times a day (BID) | RESPIRATORY_TRACT | 0 refills | Status: DC

## 2018-07-29 MED ORDER — TIOTROPIUM BROMIDE MONOHYDRATE 2.5 MCG/ACT IN AERS: 2.0000 | INHALATION_SPRAY | Freq: Every day | RESPIRATORY_TRACT | 0 refills | Status: DC

## 2018-07-29 NOTE — Progress Notes (Signed)
Subjective:    Patient ID: Carla Dawson, female    DOB: 09-20-1931,    MRN: [id number]     Brief patient profile:  55 yowf  Quit smoking 2007 with dx of copd in 2002 while living in [place] rx with Advair then Breo since quit smoking referred to pulmonary clinic 10/25/2016 by Dr   Betty Martinique   History of Present Illness  10/25/2016 1st [facility] office visit/ [place]   Chief Complaint  Patient presents with  . Pulmonary Consult    Referred by Dr. Betty Martinique.  Pt here to est care for COPD. She c/o SOB with walking approx 800 ft on a flat surface, eating and walking up an incline. She has some PND but not coughing much.    MMRC2 = can't walk a nl pace on a flat grade s sob but does fine slow and flat eg walking up to 800 ft - she measures the distances herself to keep tack as also limited by claudication - rarely feels need for saba  rec Work on inhaler technique:  relax and gently blow all the way out then take a nice smooth deep breath back in, triggering the inhaler at same time you start breathing in.  Hold for up to 5 seconds if you can. Blow out thru nose. Rinse and gargle with water when done Plan A = Automatic = Symbicort 160 Take 2 puffs first thing in am and then another 2 puffs about 12 hours later.  Plan B = Backup Only use your albuterol (proair) as a rescue medication Plan C = Crisis - only use your albuterol nebulizer if you first try Plan B and it fails to help > ok to use the nebulizer up to every 4 hours but if start needing it regularly call for immediate appointment   02/27/2017  f/u ov/Carla Dawson re:  Copd / 02 prn maint on advair 250 bid and prn saba  Chief Complaint  Patient presents with  . Follow-up    Pt states was hospitalized while in [place] early March 2018. She states she was told that she may have had the beginning of PNA. She states today her breathing is "great". She has occ non prod cough. She was sent home with o2, but has  not used in the past several days.   breathing worse suddenly while in [place] not responsive  To albuterol or 2lpm > admit x 4 days and back to baseline  MMRC2 = can't walk a nl pace on a flat grade s sob but does fine slow and flat  rec Plan A = Automatic = Breo one click each am > take two good deep drags off it but click it just once  Plan B = Backup Only use your albuterol (PROAIR) as a rescue medication Plan C = Crisis - only use your albuterol nebulizer if you first try Plan B         10/14/2017  f/u ov/Carla Dawson re:   GOLD III / maint on BREO daily / has 02 / not using  Chief Complaint  Patient presents with  . Follow-up    Pt plans to go to [place] soon for a month and wants to discuss plan to "keep from having to take prednisone".  She states she is using her proair rarely, but has been using her neb recently due to chest tightness.   doe =  MMRC3 = can't walk 100 yards even at a slow pace  at a flat grade s stopping due to sob  Can do HT but not sam's   Very hoarse on breo 100/ poor hfa  Sleeps ok  rec Plan A = Automatic = dulera 100 Take 2 puffs first thing in am and then another 2 puffs about 12 hours later.  Work on inhaler technique: Plan B = Backup Only use your albuterol [employer])  as a rescue medication  Plan C = Crisis - only use your albuterol nebulizer if you first try Plan B and it fails to help > ok to use the nebulizer up to every 4 hours but if start needing it regularly call for immediate appointment Plan D = Deltasone take 6 days if you are having to use plan c and not working great Plan E  = ER, go there if all elso fails      02/04/2018  f/u ov/Carla Dawson re:  COPDIII /one flare in Dec 2018 / BREO maint but req one course pred in last 3 m for aecopd  Chief Complaint  Patient presents with  . Follow-up    Last night she was eating and aspirated hamburger. She states that she feels pain in her throat when she takes a deep breath.  Her breathing is unchanged. She is  using her proair 3-4 x per wk on average and rarely uses neb.   Dyspnea:  MMRC3 = can't walk 100 yards even at a slow pace at a flat grade s stopping due to sob   Cough: not normally/  Sleep: ok/ no 02  SABA use:   No more than once a day/ maybe p shower  rec Plan A = Automatic = Breo or Trelegy daily  Work on inhaler technique:   Plan B = Backup Only use your albuterol (Proair)  as a rescue medication  Plan C = Crisis - only use your albuterol nebulizer if you first try Plan B Plan D = Deltasone take 6 days if you are having to use plan c and not working great Plan E  = ER, go there if all elso fails        03/23/2018 acute extended ov/Carla Dawson re:  aecopd x 3 days/ did not start Plan D/ tol neb poorly due to tremors Chief Complaint  Patient presents with  . Acute Visit    productive cough yellow mucus, sob ,cough since 4/12   was doing about the same prior to flare 03/20/18 with cough/ congestion/ thick mucus / no fever c/p and comfortable at rest but trouble sleeping due to cough / chest tight  rec zpak  Take your refillable prednisone and remember it is plan = D = Prednisone 10 mg take  4 each am x 2 days,   2 each am x 2 days,  1 each am x 2 days and stop.       04/03/2018 acute extended ov/Carla Dawson re:  aecopd  Chief Complaint  Patient presents with  . Acute Visit    Improved slightly after visit 03/23/18 and then worsened again 2-3 days ago- cough with light yellow sputum and increased SOB. She is using her albuterol inhaler 4-5 x per day and neb at least 3 x per day.    on trelegy p zpak / prednisone was only a little  better as it turns out = ok only at rest whereas baseline = walking house x 500 steps at a time s stopping - did not less saba need p prednisone but concerned getting worse  x 3 d prior to OV  And needs vascular sugery 04/07/18 and doesn't want to be turned down  Mucus turned clear on zpak, then slt yellow and thick since despite rx with mucinex rec Depomedrol 120 mg  today  Plan A = Automatic = stop trelegy for now and start symbicort 160 x 2 puffs then spiriva 2 pffs and 12 hours later just the symbicort 160 x 2 pffs Work on inhaler technique:  Plan B = Backup Only use your albuterol as a rescue medication Plan C = Crisis - only use your albuterol nebulizer if you first try Plan B and it fails to help > ok to use the nebulizer up to every 4 hours but if start needing it regularly call for immediate appointment Plan D = Deltasone = prednisone 10 mg 2 daily until all better then 1 daily x 3 days and stop  For cough mucinex dm 1200 mg every 12 hours  Try prilosec otc 20mg   Take 30-60 min before first meal of the day and Pepcid ac (famotidine) 20 mg one @  bedtime until cough is completely gone for at least a week without the need for cough suppression Return here when you come back for [place]     06/29/2018 acute  ov/Carla Dawson re: sob/cough c/w aecopd in pt with GOLD III criteria  Chief Complaint  Patient presents with  . Acute Visit    increased SOB, cough with yellow sputum and wheezing for the past 5-6 days. She has started using her neb with albuterol 3 x daily.   did fine in June and most of July 2019 on Symb / spiriva then acutely ill x about a week and did not follow written action plan but started using neb as above with some relief and typically comfortable at rest p neb but doe x across the room assoc the purulent sputum,subj wheezing worse at hs rec Depomedrol 120 mg today  Zpak  Plan A = Automatic = continue  symbicort 160 x 2 puffs then spiriva 2 pffs and 12 hours later just the symbicort 160 x 2 pffs Work on inhaler technique:  relax and gently blow all the way out then take a nice smooth deep breath back in, triggering the inhaler at same time you start breathing in.  Hold for up to 5 seconds if you can. Blow out thru nose. Rinse and gargle with water when done Plan B = Backup Only use your albuterol as a rescue medication  Plan C = Crisis -  only use your albuterol nebulizer if you first try Plan B and it fails to help > ok to use the nebulizer up to every 4 hours but if start needing it regularly call for immediate appointment Plan D = Deltasone = prednisone 10 mg 2 daily until all better then 1 daily x 5 days and stop  For cough mucinex dm 1200 mg every 12 hours with the flutter valve Try prilosec otc 20mg   Take 30-60 min before first meal of the day and Pepcid ac (famotidine) 20 mg one @  bedtime until cough is completely gone for at least a week without the need for cough suppression    07/29/2018  f/u ov/Carla Dawson re:  GOLD III copd/ intol of dpi  Chief Complaint  Patient presents with  . Follow-up    pt states she is doing well, wants to discuss meds.  pt in [place].    Dyspnea:  Walks slow anywhere/ no steps =  MMRC2 = can't walk a nl pace on a flat grade s sob but does fine slow and flat  Cough: min / much worse with dpi to point of wheeze > neb > pred now resolved  Sleeping: flat one pillow SABA use: rare 02: no    No obvious day to day or daytime variability or assoc excess/ purulent sputum or mucus plugs or hemoptysis or cp or chest tightness, subjective wheeze or overt sinus or hb symptoms.   Sleeping as above  without nocturnal  or early am exacerbation  of respiratory  c/o's or need for noct saba. Also denies any obvious fluctuation of symptoms with weather or environmental changes or other aggravating or alleviating factors except as outlined above   No unusual exposure hx or h/o childhood pna/ asthma or knowledge of premature birth.  Current Allergies, Complete Past Medical History, Past Surgical History, Family History, and Social History were reviewed in [employer] record.  ROS  The following are not active complaints unless bolded Hoarseness, sore throat, dysphagia, dental problems, itching, sneezing,  nasal congestion or discharge of excess mucus or purulent secretions, ear ache,    fever, chills, sweats, unintended wt loss or wt gain, classically pleuritic or exertional cp,  orthopnea pnd or arm/hand swelling  or leg swelling, presyncope, palpitations, abdominal pain, anorexia, nausea, vomiting, diarrhea  or change in bowel habits or change in bladder habits, change in stools or change in urine, dysuria, hematuria,  rash, arthralgias, visual complaints, headache, numbness, weakness or ataxia or problems with walking or coordination,  change in mood or  memory.        Current Meds  Medication Sig  . albuterol (PROVENTIL HFA;VENTOLIN HFA) 108 (90 Base) MCG/ACT inhaler INHALE 2 PUFFS BY MOUTH EVERY 4 HOURS AS NEEDED FOR WHEEZING  . albuterol (PROVENTIL) (2.5 MG/3ML) 0.083% nebulizer solution INHALE 1 NEBULE VIA NEBULIZER EVERY 4 HOURS AS NEEDED FOR WHEEZING OR SHORTNESS OF BREATH  . amLODipine (NORVASC) 5 MG tablet Take 1 tablet (5 mg total) by mouth at bedtime.  Marland Kitchen atorvastatin (LIPITOR) 10 MG tablet Take 1 tablet (10 mg total) by mouth daily at 6 PM.  . budesonide-formoterol (SYMBICORT) 160-4.5 MCG/ACT inhaler Inhale 2 puffs into the lungs 2 (two) times daily.  . cetirizine (ZYRTEC) 10 MG tablet Take 10 mg by mouth at bedtime.   . clopidogrel (PLAVIX) 75 MG tablet Take 1 tablet (75 mg total) by mouth at bedtime. TAKE 1 TABLET(75 MG) BY MOUTH DAILY  . gabapentin (NEURONTIN) 100 MG capsule Take 1 capsule (100 mg total) by mouth 3 (three) times daily.  . Multiple Vitamin (MULTIVITAMIN WITH MINERALS) TABS tablet Take 1 tablet by mouth every evening.   Marland Kitchen oxyCODONE (OXY IR/ROXICODONE) 5 MG immediate release tablet Take 1-2 tablets (5-10 mg total) by mouth every 4 (four) hours as needed for moderate pain.  Marland Kitchen oxyCODONE-acetaminophen (PERCOCET) 5-325 MG tablet Take 1 tablet by mouth every 6 (six) hours as needed for severe pain.  . Probiotic Product (SOLUBLE FIBER/PROBIOTICS) CHEW Chew 1 each by mouth daily. gummie Dispersive advantage  . SPIRIVA RESPIMAT 2.5 MCG/ACT AERS INHALE 2 PUFFS  INTO THE LUNGS DAILY              Objective:   Physical Exam  amb wf nad    07/29/2018      112  06/29/2018      113  04/03/2018       110  03/23/2018       111  02/04/2018       109  10/14/2017       108  04/03/2017       100   02/27/2017      96  10/25/16 92 lb 9.6 oz (42 kg)  10/15/16 93 lb 9 oz (42.4 kg)  10/14/16 93 lb (42.2 kg)     Vital signs reviewed - Note on arrival 02 sats  98% on RA    HEENT: nl dentition / oropharynx. Nl external ear canals without cough reflex -  Mild bilateral non-specific turbinate edema     NECK :  without JVD/Nodes/TM/ nl carotid upstrokes bilaterally   LUNGS: no acc muscle use,  Mod barrel  contour chest wall with bilateral  Distant bs s audible wheeze and  without cough on insp or exp maneuver and mod  Hyperresonant  to  percussion bilaterally     CV:  RRR  no s3 or murmur or increase in P2, and no edema   ABD:  soft and nontender with pos mid insp Hoover's  in the supine position. No bruits or organomegaly appreciated, bowel sounds nl  MS:   Nl gait/  ext warm without deformities, calf tenderness, cyanosis or clubbing No obvious joint restrictions   SKIN: warm and dry without lesions    NEURO:  alert, approp, nl sensorium with  no motor or cerebellar deficits apparent.             Assessment & Plan:

## 2018-07-29 NOTE — Assessment & Plan Note (Addendum)
10/25/2016  try symbicort 160 2bid instead  of breo > preferred breo - 02/27/2017  After extensive coaching HFA effectiveness =    25% with hfa/ 75% with dpi so try back to BREO - PFT's  04/03/2017  FEV1 0.59 (37 % ) ratio 34  p 12  % improvement from saba p BREO  prior to study with DLCO  35/35 % corrects to 49  % for alv volume   - 10/14/2017    > try dulera 100 2bid due to hoarseness on breo   - 02/04/18 >  Added prednisone as Plan D - 02/04/2018 trial of trelegy>  Refractory aecopd 04/03/2018 > changed to symb 160/ spiriva 2.5 and pred 20 until better than taper off as Plan D    - 07/29/2018  After extensive coaching inhaler device,  effectiveness =    75% at best (short Ti)   Despite suboptimal hfa/smi she is following the plan abcd well and remains well compensated x when tried to use dpi which caused harsh coughing fits so should remain on hfa/smi for now and f/u in 4 months while spending the fall in [place]    I had an extended discussion with the patient reviewing all relevant studies completed to date and  lasting 15 to 20 minutes of a 25 minute visit    See device teaching which extended face to face time for this visit.  Each maintenance medication was reviewed in detail including emphasizing most importantly the difference between maintenance and prns and under what circumstances the prns are to be triggered using an action plan format that is not reflected in the computer generated alphabetically organized AVS which I have not found useful in most complex patients, especially with respiratory illnesses  Please see AVS for specific instructions unique to this visit that I personally wrote and verbalized to the the pt in detail and then reviewed with pt  by my nurse highlighting any  changes in therapy recommended at today's visit to their plan of care.

## 2018-07-29 NOTE — Patient Instructions (Signed)
Work on inhaler technique:  relax and gently blow all the way out then take a nice smooth deep breath back in, triggering the inhaler at same time you start breathing in.  Hold for up to 5 seconds if you can. Blow out thru nose. Rinse and gargle with water when done   Please schedule a follow up visit in 4  months but call sooner if needed

## 2018-07-30 ENCOUNTER — Encounter: Payer: Self-pay | Admitting: Internal Medicine

## 2018-08-18 ENCOUNTER — Ambulatory Visit: Payer: Medicare Other | Admitting: Internal Medicine

## 2018-10-12 ENCOUNTER — Ambulatory Visit: Payer: Medicare Other | Admitting: Surgery

## 2018-10-12 ENCOUNTER — Encounter (HOSPITAL_COMMUNITY): Payer: Medicare Other

## 2018-10-12 ENCOUNTER — Other Ambulatory Visit (HOSPITAL_COMMUNITY): Payer: Medicare Other

## 2018-10-26 ENCOUNTER — Telehealth: Payer: Self-pay

## 2018-10-26 ENCOUNTER — Other Ambulatory Visit (HOSPITAL_COMMUNITY): Payer: Medicare Other

## 2018-10-26 ENCOUNTER — Ambulatory Visit: Payer: Medicare Other | Admitting: Surgery

## 2018-10-26 ENCOUNTER — Encounter (HOSPITAL_COMMUNITY): Payer: Medicare Other

## 2018-10-26 NOTE — Telephone Encounter (Signed)
Returned patient's daughter's call ([name]) regarding patient's right leg pain. Daughter is unsure how long pain has been going on but she thinks 2 or so weeks. She has a fit bit and pain begins once she has done about 150-170 steps. Per daughter her right leg is still swollen since surgery in May. Patient states leg is colder during day and warms up by the time she wakes up in morning, throbbing comes and goes and leg appears slightly paler than left leg. Patient states pain goes up to 7-10/10 after walking. Scheduled her to see NP tomorrow at 12:15. Daughter is aware of appt.

## 2018-10-27 ENCOUNTER — Other Ambulatory Visit: Payer: Self-pay | Admitting: *Deleted

## 2018-10-27 ENCOUNTER — Ambulatory Visit (HOSPITAL_COMMUNITY)
Admission: RE | Admit: 2018-10-27 | Discharge: 2018-10-27 | Disposition: A | Discharge status: Home / Self Care | Payer: Medicare Other | Source: Ambulatory Visit | Attending: Vascular Surgery | Admitting: Vascular Surgery

## 2018-10-27 ENCOUNTER — Other Ambulatory Visit: Payer: Self-pay

## 2018-10-27 ENCOUNTER — Telehealth: Payer: Self-pay | Admitting: *Deleted

## 2018-10-27 ENCOUNTER — Encounter: Payer: Self-pay | Admitting: Family

## 2018-10-27 ENCOUNTER — Ambulatory Visit: Payer: Medicare Other | Admitting: Family

## 2018-10-27 ENCOUNTER — Ambulatory Visit (INDEPENDENT_AMBULATORY_CARE_PROVIDER_SITE_OTHER): Payer: Medicare Other | Admitting: Family

## 2018-10-27 VITALS — BP 163/65 | HR 74 | Temp 97.1°F | Resp 20 | Ht 61.0 in | Wt 111.0 lb

## 2018-10-27 DIAGNOSIS — I70213 Atherosclerosis of native arteries of extremities with intermittent claudication, bilateral legs: Secondary | ICD-10-CM | POA: Diagnosis not present

## 2018-10-27 DIAGNOSIS — Z95828 Presence of other vascular implants and grafts: Secondary | ICD-10-CM | POA: Diagnosis not present

## 2018-10-27 DIAGNOSIS — I739 Peripheral vascular disease, unspecified: Secondary | ICD-10-CM

## 2018-10-27 DIAGNOSIS — Z9862 Peripheral vascular angioplasty status: Secondary | ICD-10-CM | POA: Diagnosis not present

## 2018-10-27 DIAGNOSIS — Z87891 Personal history of nicotine dependence: Secondary | ICD-10-CM

## 2018-10-27 DIAGNOSIS — I779 Disorder of arteries and arterioles, unspecified: Secondary | ICD-10-CM | POA: Diagnosis not present

## 2018-10-27 NOTE — Telephone Encounter (Signed)
Spoke with daughter. Instructed to be at [facility] admitting department at 5:30 am on 11/03/2018 for procedure. NPO past MN  Night prior and to take amlodipine with sips of water and regular am inhalers am of procedure and hold all others. Daughter assures me she knows what to do since her mother has this procedure done every 6 months.

## 2018-10-27 NOTE — Patient Instructions (Signed)

## 2018-10-27 NOTE — H&P (View-Only) (Signed)
[facility] [place]   CC: Follow up peripheral artery occlusive disease  History of Present Illness Carla Dawson is a 84 y.o. female who is s/p left CFA endarterectomy and femoral to BK popliteal bypass with PTFE by Dr. Oneida Alar on 04/12/18.  This case was done emergently due to acute ischemia of LLE.  Surgical history also signficant for R fem-pop bypass.  The patient's wounds are healed.   Pt was last evaluated on 06-04-18 by M. Eveland PA-C. At that time the patient's bypass incisions were well healed  Patent bypass with B ABIs 0.9 Pt was to continue aspirin and plavix Ambulation encouraged elevate LLE when not walking Pt was to follow up with graft surveillance in 3 months with Dr. Trula Slade.  Pt has not returned until today after pt daughter called yesterday regarding patient's right leg pain. Pt c/o 3-4 week hx of worsening claudication in right calf, and will claudicate in right thigh with continued walking. Calf and thigh pain resolve after a few minutes rest.    She has a fit bit and pain begins once she has done about 150-170 steps. Per daughter her right leg is still swollen since surgery in May. Patient states leg is colder during day and warms up by the time she wakes up in morning, throbbing comes and goes and leg appears slightly paler than left leg. Patient states pain goes up to 7-10/10 after walking.  [place]  Past medical history: COPD managed with inhalers, Plavix daily s/p stent placement, and HTN managed with Norvasc. She states she has no history of CAD or DM.   She denies any known history of stroke.  Diabetic: No Tobacco use: former smoker, quit in 2007, smoked x 52years  Pt meds include: Statin : atorvastatin 10 mg Betablocker: No ASA: No Other anticoagulants/antiplatelets: Plavix     Past Medical History:  Diagnosis Date  . Allergy    "maybe seasonal allergies" (07/08/2017)  .  Arthritis    "hands" (07/08/2017)  . Chicken pox   . COPD (chronic obstructive pulmonary disease) ([place])   . High cholesterol    "took me off pills ~ 1-2 months ago" (07/08/2017)  . History of blood transfusion 2017   "when I had blood clot in my leg"  . Hypertension   . PAD (peripheral artery disease) ([place])    severe/notes 10/14/2016  . Pneumonia ~ 2015  . PVD (peripheral vascular disease) ([place])     Social History Social History   Tobacco Use  . Smoking status: Former Smoker    Packs/day: 1.50    Years: 52.00    Pack years: 78.00    Start date: 12/09/1953    Last attempt to quit: 12/09/2005    Years since quitting: 12.8  . Smokeless tobacco: Never Used  Substance Use Topics  . Alcohol use: No  . Drug use: No    Family History Family History  Problem Relation Age of Onset  . Arthritis Mother   . Hypertension Mother   . Arthritis Father   . Hypertension Father   . Cancer Brother   . Hypertension Son     Past Surgical History:  Procedure Laterality Date  . ABDOMINAL AORTOGRAM N/A 04/22/2017   Procedure: Abdominal Aortogram;  Surgeon: Serafina Mitchell, MD;  Location: [facility];  Service: Cardiovascular;  Laterality: N/A;  . ABDOMINAL AORTOGRAM W/LOWER EXTREMITY N/A 03/04/2017   Procedure: Abdominal Aortogram w/Lower Extremity;  Surgeon:  Serafina Mitchell, MD;  Location: [facility];  Service: Cardiovascular;  Laterality: N/A;  . ABDOMINAL AORTOGRAM W/LOWER EXTREMITY N/A 07/08/2017   Procedure: Abdominal Aortogram w/Lower Extremity;  Surgeon: Serafina Mitchell, MD;  Location: [facility];  Service: Cardiovascular;  Laterality: N/A;  . BIOPSY THYROID    . CATARACT EXTRACTION W/ INTRAOCULAR LENS  IMPLANT, BILATERAL Bilateral   . EMBOLECTOMY Right 04/09/2018   Procedure: RIGHT COMMON FEMORAL ENDARTERECTOMY  WITH BOVINE PATCH ANGIOPLASTY; RIGHT LOWER LEG THROMBOEMBOLECTOMY INTRAOPERATIVE ARTERIOGRAM;  Surgeon: Waynetta Sandy, MD;  Location: [facility];   Service: Vascular;  Laterality: Right;  . FEMORAL-POPLITEAL BYPASS GRAFT Left 04/12/2018   Procedure: Vein Patch Angioplasty Below Knee Popliteal Artery; Left Femoral Endarterectomy with Profundaplasty; Left Femoral-Below Knee Popliteal Bypass Graft using a 67mm by 80cm Thin Wall Propaten Gortex Graft;  Surgeon: Elam Dutch, MD;  Location: [facility];  Service: Vascular;  Laterality: Left;  . HEMATOMA EVACUATION Right 2017   "S/P procedure; discharged; had to come back for emergency OR"  . LOWER EXTREMITY ANGIOGRAPHY Bilateral 04/22/2017   Procedure: Lower Extremity Angiography;  Surgeon: Serafina Mitchell, MD;  Location: [facility];  Service: Cardiovascular;  Laterality: Bilateral;  . LOWER EXTREMITY ANGIOGRAPHY Bilateral 04/07/2018   Procedure: LOWER EXTREMITY ANGIOGRAPHY;  Surgeon: Serafina Mitchell, MD;  Location: [facility];  Service: Cardiovascular;  Laterality: Bilateral;  . PERIPHERAL VASCULAR BALLOON ANGIOPLASTY Left 03/04/2017   Procedure: Peripheral Vascular Balloon Angioplasty;  Surgeon: Serafina Mitchell, MD;  Location: [facility];  Service: Cardiovascular;  Laterality: Left;  SFA and PT  . PERIPHERAL VASCULAR INTERVENTION  07/08/2017  . PERIPHERAL VASCULAR INTERVENTION  07/08/2017   Procedure: Peripheral Vascular Intervention;  Surgeon: Serafina Mitchell, MD;  Location: [facility];  Service: Cardiovascular;;  . PERIPHERAL VASCULAR INTERVENTION Left 04/07/2018   Procedure: PERIPHERAL VASCULAR INTERVENTION;  Surgeon: Serafina Mitchell, MD;  Location: [facility];  Service: Cardiovascular;  Laterality: Left;  Marland Kitchen VAGINAL HYSTERECTOMY    . VEIN BYPASS SURGERY      Allergies  Allergen Reactions  . Hydrocodone-Acetaminophen Itching and Shortness Of Breath  . Lortab [Hydrocodone-Acetaminophen] Shortness Of Breath and Itching  . Morphine And Related Other (See Comments)    Unknown per pt.    Current Outpatient Medications  Medication Sig Dispense Refill  .  albuterol (PROVENTIL HFA;VENTOLIN HFA) 108 (90 Base) MCG/ACT inhaler INHALE 2 PUFFS BY MOUTH EVERY 4 HOURS AS NEEDED FOR WHEEZING 8.5 g 0  . albuterol (PROVENTIL) (2.5 MG/3ML) 0.083% nebulizer solution INHALE 1 NEBULE VIA NEBULIZER EVERY 4 HOURS AS NEEDED FOR WHEEZING OR SHORTNESS OF BREATH 300 mL 2  . amLODipine (NORVASC) 5 MG tablet Take 1 tablet (5 mg total) by mouth at bedtime. 90 tablet 3  . atorvastatin (LIPITOR) 10 MG tablet Take 1 tablet (10 mg total) by mouth daily at 6 PM.    . budesonide-formoterol (SYMBICORT) 160-4.5 MCG/ACT inhaler Inhale 2 puffs into the lungs 2 (two) times daily. 1 Inhaler 11  . cetirizine (ZYRTEC) 10 MG tablet Take 10 mg by mouth at bedtime.     . clopidogrel (PLAVIX) 75 MG tablet Take 1 tablet (75 mg total) by mouth at bedtime. TAKE 1 TABLET(75 MG) BY MOUTH DAILY    . gabapentin (NEURONTIN) 100 MG capsule Take 1 capsule (100 mg total) by mouth 3 (three) times daily. 90 capsule 2  . Multiple Vitamin (MULTIVITAMIN WITH MINERALS) TABS tablet Take 1 tablet by  mouth every evening.     Marland Kitchen oxyCODONE (OXY IR/ROXICODONE) 5 MG immediate release tablet Take 1-2 tablets (5-10 mg total) by mouth every 4 (four) hours as needed for moderate pain. 30 tablet 0  . oxyCODONE-acetaminophen (PERCOCET) 5-325 MG tablet Take 1 tablet by mouth every 6 (six) hours as needed for severe pain. 20 tablet 0  . Probiotic Product (SOLUBLE FIBER/PROBIOTICS) CHEW Chew 1 each by mouth daily. gummie Dispersive advantage    . SPIRIVA RESPIMAT 2.5 MCG/ACT AERS INHALE 2 PUFFS INTO THE LUNGS DAILY 1 Inhaler 11   No current facility-administered medications for this visit.     ROS: See HPI for pertinent positives and negatives.   Physical Examination  Vitals:   10/27/18 1213  BP: (!) 163/65  Pulse: 74  Resp: 20  Temp: (!) 97.1 F (36.2 C)  TempSrc: Oral  SpO2: 93%  Weight: 111 lb (50.3 kg)  Height: 5\' 1"  (1.549 m)    Body mass index is 20.97 kg/m.  General: A&O x 3, WDWN, elderly  female. Gait: slow, steady HENT: No gross abnormalities.  Eyes: PERRLA. Pulmonary: Respirations are non labored, CTAB, good air movement in all fields Cardiac: irregular rhythm with controlled rate,  no detected murmur.         Carotid Bruits Right Left   Negative Negative   Radial pulses are 2+ palpable bilaterally   Adominal aortic pulse is not palpable                         VASCULAR EXAM: Extremities without ischemic changes, without Gangrene; without open wounds.                                                                                                          LE Pulses Right Left       FEMORAL  not palpable  2+ palpable        POPLITEAL  not palpable   not palpable       POSTERIOR TIBIAL  not palpable, dampened Doppler signal   not palpable, brisk Doppler signal        DORSALIS PEDIS      ANTERIOR TIBIAL not palpable, dampened Doppler signal  not palpable, + Doppler signal    Abdomen: soft, NT, no palpable masses. Skin: no rashes, no cellulitis, no ulcers noted. Musculoskeletal: no muscle wasting or atrophy.  Neurologic: A&O X 3; appropriate affect, Sensation is normal; MOTOR FUNCTION:  moving all extremities equally, motor strength 5/5 throughout. Speech is fluent/normal. CN 2-12 intact except has significant hearing loss.  Psychiatric: Thought content is normal, mood appropriate for clinical situation.     ASSESSMENT: Jalasia Eskridge is a 84 y.o. female who is s/p L CFA endarterectomy and femoral to BK popliteal artery bypass with PTFE on 04/12/18.  She is also s/p  Re-exposure right common femoral artery greater than 30 days  2.  Right common femoral endarterectomy with bovine pericardial patch angioplasty 3.  Right lower extremity embolectomy 4.  Right lower extremity angiogram On 04-09-18  by Dr. Trula Slade  for Acute right lower extremity ischemia.   She returns today with a 3-4 week hx of worsening claudication in right calf, and will claudicate in  right thigh with continued walking. Calf and thigh pain resolve after a few minutes rest.  Her daughter states that pt has known irregular cardiac rhythm, known hx of transient atrial fib per daughter. Daughter states pt PCP is aware of this and pt has an appointment with a cardiologist on 11-16-18. Pt is not taking an anticoagulant, is taking Plavix   0.63 serum creatinine on 04-13-18.   Dr. Donnetta Hutching reviewed pt surgical history, results of today's ABI's, then spoke with pt and her daughter and examined pt. Marland Kitchen    DATA  ABI (Date: 10/27/2018):  R:   ABI: 0.52 (was 0.87 on 06-04-18),   PT: dampened mono  DP: mono  TBI:  0.72, toe pressure 129, (was 0.60)  L:   ABI: 0.86 (was 0.90),   PT: tri  DP: tri  TBI: 0.64, toe pressure 114, (was 0.63) Significant decline in right ABI with dampened monophasic and monophasic waveforms, moderate disease. Stable left ABI with mild disease, triphasic waveforms.      PLAN:  Based on the patient's vascular studies and examination, pt will be scheduled for arteriogram with run off, possible intervention, by Dr. Trula Slade, for right leg claudication that is life limiting.    I discussed in depth with the patient the nature of atherosclerosis, and emphasized the importance of maximal medical management including strict control of blood pressure, blood glucose, and lipid levels, obtaining regular exercise, and continued cessation of smoking.  The patient is aware that without maximal medical management the underlying atherosclerotic disease process will progress, limiting the benefit of any interventions.  The patient was given information about PAD including signs, symptoms, treatment, what symptoms should prompt the patient to seek immediate medical care, and risk reduction measures to take.  Clemon Chambers, RN, MSN, FNP-C [facility] Phone: [phone]  Clinic MD: Bishop Dublin  10/27/18 2:27 PM

## 2018-10-27 NOTE — Progress Notes (Signed)
[facility] [place]   CC: Follow up peripheral artery occlusive disease  History of Present Illness Carla Dawson is a 84 y.o. female who is s/p left CFA endarterectomy and femoral to BK popliteal bypass with PTFE by Dr. Oneida Alar on 04/12/18.  This case was done emergently due to acute ischemia of LLE.  Surgical history also signficant for R fem-pop bypass.  The patient's wounds are healed.   Pt was last evaluated on 06-04-18 by M. Eveland PA-C. At that time the patient's bypass incisions were well healed  Patent bypass with B ABIs 0.9 Pt was to continue aspirin and plavix Ambulation encouraged elevate LLE when not walking Pt was to follow up with graft surveillance in 3 months with Dr. Trula Slade.  Pt has not returned until today after pt daughter called yesterday regarding patient's right leg pain. Pt c/o 3-4 week hx of worsening claudication in right calf, and will claudicate in right thigh with continued walking. Calf and thigh pain resolve after a few minutes rest.    She has a fit bit and pain begins once she has done about 150-170 steps. Per daughter her right leg is still swollen since surgery in May. Patient states leg is colder during day and warms up by the time she wakes up in morning, throbbing comes and goes and leg appears slightly paler than left leg. Patient states pain goes up to 7-10/10 after walking.  [place]  Past medical history: COPD managed with inhalers, Plavix daily s/p stent placement, and HTN managed with Norvasc. She states she has no history of CAD or DM.   She denies any known history of stroke.  Diabetic: No Tobacco use: former smoker, quit in 2007, smoked x 52years  Pt meds include: Statin : atorvastatin 10 mg Betablocker: No ASA: No Other anticoagulants/antiplatelets: Plavix     Past Medical History:  Diagnosis Date  . Allergy    "maybe seasonal allergies" (07/08/2017)  .  Arthritis    "hands" (07/08/2017)  . Chicken pox   . COPD (chronic obstructive pulmonary disease) ([place])   . High cholesterol    "took me off pills ~ 1-2 months ago" (07/08/2017)  . History of blood transfusion 2017   "when I had blood clot in my leg"  . Hypertension   . PAD (peripheral artery disease) ([place])    severe/notes 10/14/2016  . Pneumonia ~ 2015  . PVD (peripheral vascular disease) ([place])     Social History Social History   Tobacco Use  . Smoking status: Former Smoker    Packs/day: 1.50    Years: 52.00    Pack years: 78.00    Start date: [birth year]/09/1953    Last attempt to quit: 12/09/2005    Years since quitting: 12.8  . Smokeless tobacco: Never Used  Substance Use Topics  . Alcohol use: No  . Drug use: No    Family History Family History  Problem Relation Age of Onset  . Arthritis Mother   . Hypertension Mother   . Arthritis Father   . Hypertension Father   . Cancer Brother   . Hypertension Son     Past Surgical History:  Procedure Laterality Date  . ABDOMINAL AORTOGRAM N/A 04/22/2017   Procedure: Abdominal Aortogram;  Surgeon: Serafina Mitchell, MD;  Location: [facility];  Service: Cardiovascular;  Laterality: N/A;  . ABDOMINAL AORTOGRAM W/LOWER EXTREMITY N/A 03/04/2017   Procedure: Abdominal Aortogram w/Lower Extremity;  Surgeon:  Serafina Mitchell, MD;  Location: [facility];  Service: Cardiovascular;  Laterality: N/A;  . ABDOMINAL AORTOGRAM W/LOWER EXTREMITY N/A 07/08/2017   Procedure: Abdominal Aortogram w/Lower Extremity;  Surgeon: Serafina Mitchell, MD;  Location: [facility];  Service: Cardiovascular;  Laterality: N/A;  . BIOPSY THYROID    . CATARACT EXTRACTION W/ INTRAOCULAR LENS  IMPLANT, BILATERAL Bilateral   . EMBOLECTOMY Right 04/09/2018   Procedure: RIGHT COMMON FEMORAL ENDARTERECTOMY  WITH BOVINE PATCH ANGIOPLASTY; RIGHT LOWER LEG THROMBOEMBOLECTOMY INTRAOPERATIVE ARTERIOGRAM;  Surgeon: Waynetta Sandy, MD;  Location: [facility];   Service: Vascular;  Laterality: Right;  . FEMORAL-POPLITEAL BYPASS GRAFT Left 04/12/2018   Procedure: Vein Patch Angioplasty Below Knee Popliteal Artery; Left Femoral Endarterectomy with Profundaplasty; Left Femoral-Below Knee Popliteal Bypass Graft using a 44mm by 80cm Thin Wall Propaten Gortex Graft;  Surgeon: Elam Dutch, MD;  Location: [facility];  Service: Vascular;  Laterality: Left;  . HEMATOMA EVACUATION Right 2017   "S/P procedure; discharged; had to come back for emergency OR"  . LOWER EXTREMITY ANGIOGRAPHY Bilateral 04/22/2017   Procedure: Lower Extremity Angiography;  Surgeon: Serafina Mitchell, MD;  Location: [facility];  Service: Cardiovascular;  Laterality: Bilateral;  . LOWER EXTREMITY ANGIOGRAPHY Bilateral 04/07/2018   Procedure: LOWER EXTREMITY ANGIOGRAPHY;  Surgeon: Serafina Mitchell, MD;  Location: [facility];  Service: Cardiovascular;  Laterality: Bilateral;  . PERIPHERAL VASCULAR BALLOON ANGIOPLASTY Left 03/04/2017   Procedure: Peripheral Vascular Balloon Angioplasty;  Surgeon: Serafina Mitchell, MD;  Location: [facility];  Service: Cardiovascular;  Laterality: Left;  SFA and PT  . PERIPHERAL VASCULAR INTERVENTION  07/08/2017  . PERIPHERAL VASCULAR INTERVENTION  07/08/2017   Procedure: Peripheral Vascular Intervention;  Surgeon: Serafina Mitchell, MD;  Location: [facility];  Service: Cardiovascular;;  . PERIPHERAL VASCULAR INTERVENTION Left 04/07/2018   Procedure: PERIPHERAL VASCULAR INTERVENTION;  Surgeon: Serafina Mitchell, MD;  Location: [facility];  Service: Cardiovascular;  Laterality: Left;  Marland Kitchen VAGINAL HYSTERECTOMY    . VEIN BYPASS SURGERY      Allergies  Allergen Reactions  . Hydrocodone-Acetaminophen Itching and Shortness Of Breath  . Lortab [Hydrocodone-Acetaminophen] Shortness Of Breath and Itching  . Morphine And Related Other (See Comments)    Unknown per pt.    Current Outpatient Medications  Medication Sig Dispense Refill  .  albuterol (PROVENTIL HFA;VENTOLIN HFA) 108 (90 Base) MCG/ACT inhaler INHALE 2 PUFFS BY MOUTH EVERY 4 HOURS AS NEEDED FOR WHEEZING 8.5 g 0  . albuterol (PROVENTIL) (2.5 MG/3ML) 0.083% nebulizer solution INHALE 1 NEBULE VIA NEBULIZER EVERY 4 HOURS AS NEEDED FOR WHEEZING OR SHORTNESS OF BREATH 300 mL 2  . amLODipine (NORVASC) 5 MG tablet Take 1 tablet (5 mg total) by mouth at bedtime. 90 tablet 3  . atorvastatin (LIPITOR) 10 MG tablet Take 1 tablet (10 mg total) by mouth daily at 6 PM.    . budesonide-formoterol (SYMBICORT) 160-4.5 MCG/ACT inhaler Inhale 2 puffs into the lungs 2 (two) times daily. 1 Inhaler 11  . cetirizine (ZYRTEC) 10 MG tablet Take 10 mg by mouth at bedtime.     . clopidogrel (PLAVIX) 75 MG tablet Take 1 tablet (75 mg total) by mouth at bedtime. TAKE 1 TABLET(75 MG) BY MOUTH DAILY    . gabapentin (NEURONTIN) 100 MG capsule Take 1 capsule (100 mg total) by mouth 3 (three) times daily. 90 capsule 2  . Multiple Vitamin (MULTIVITAMIN WITH MINERALS) TABS tablet Take 1 tablet by  mouth every evening.     Marland Kitchen oxyCODONE (OXY IR/ROXICODONE) 5 MG immediate release tablet Take 1-2 tablets (5-10 mg total) by mouth every 4 (four) hours as needed for moderate pain. 30 tablet 0  . oxyCODONE-acetaminophen (PERCOCET) 5-325 MG tablet Take 1 tablet by mouth every 6 (six) hours as needed for severe pain. 20 tablet 0  . Probiotic Product (SOLUBLE FIBER/PROBIOTICS) CHEW Chew 1 each by mouth daily. gummie Dispersive advantage    . SPIRIVA RESPIMAT 2.5 MCG/ACT AERS INHALE 2 PUFFS INTO THE LUNGS DAILY 1 Inhaler 11   No current facility-administered medications for this visit.     ROS: See HPI for pertinent positives and negatives.   Physical Examination  Vitals:   10/27/18 1213  BP: (!) 163/65  Pulse: 74  Resp: 20  Temp: (!) 97.1 F (36.2 C)  TempSrc: Oral  SpO2: 93%  Weight: 111 lb (50.3 kg)  Height: 5\' 1"  (1.549 m)    Body mass index is 20.97 kg/m.  General: A&O x 3, WDWN, elderly  female. Gait: slow, steady HENT: No gross abnormalities.  Eyes: PERRLA. Pulmonary: Respirations are non labored, CTAB, good air movement in all fields Cardiac: irregular rhythm with controlled rate,  no detected murmur.         Carotid Bruits Right Left   Negative Negative   Radial pulses are 2+ palpable bilaterally   Adominal aortic pulse is not palpable                         VASCULAR EXAM: Extremities without ischemic changes, without Gangrene; without open wounds.                                                                                                          LE Pulses Right Left       FEMORAL  not palpable  2+ palpable        POPLITEAL  not palpable   not palpable       POSTERIOR TIBIAL  not palpable, dampened Doppler signal   not palpable, brisk Doppler signal        DORSALIS PEDIS      ANTERIOR TIBIAL not palpable, dampened Doppler signal  not palpable, + Doppler signal    Abdomen: soft, NT, no palpable masses. Skin: no rashes, no cellulitis, no ulcers noted. Musculoskeletal: no muscle wasting or atrophy.  Neurologic: A&O X 3; appropriate affect, Sensation is normal; MOTOR FUNCTION:  moving all extremities equally, motor strength 5/5 throughout. Speech is fluent/normal. CN 2-12 intact except has significant hearing loss.  Psychiatric: Thought content is normal, mood appropriate for clinical situation.     ASSESSMENT: Carla [facility] is a 84 y.o. female who is s/p L CFA endarterectomy and femoral to BK popliteal artery bypass with PTFE on 04/12/18.  She is also s/p  Re-exposure right common femoral artery greater than 30 days  2.  Right common femoral endarterectomy with bovine pericardial patch angioplasty 3.  Right lower extremity embolectomy 4.  Right lower extremity angiogram On 04-09-18  by Dr. Trula Slade  for Acute right lower extremity ischemia.   She returns today with a 3-4 week hx of worsening claudication in right calf, and will claudicate in  right thigh with continued walking. Calf and thigh pain resolve after a few minutes rest.  Her daughter states that pt has known irregular cardiac rhythm, known hx of transient atrial fib per daughter. Daughter states pt PCP is aware of this and pt has an appointment with a cardiologist on 11-16-18. Pt is not taking an anticoagulant, is taking Plavix   0.63 serum creatinine on 04-13-18.   Dr. Donnetta Hutching reviewed pt surgical history, results of today's ABI's, then spoke with pt and her daughter and examined pt. Marland Kitchen    DATA  ABI (Date: 10/27/2018):  R:   ABI: 0.52 (was 0.87 on 06-04-18),   PT: dampened mono  DP: mono  TBI:  0.72, toe pressure 129, (was 0.60)  L:   ABI: 0.86 (was 0.90),   PT: tri  DP: tri  TBI: 0.64, toe pressure 114, (was 0.63) Significant decline in right ABI with dampened monophasic and monophasic waveforms, moderate disease. Stable left ABI with mild disease, triphasic waveforms.      PLAN:  Based on the patient's vascular studies and examination, pt will be scheduled for arteriogram with run off, possible intervention, by Dr. Trula Slade, for right leg claudication that is life limiting.    I discussed in depth with the patient the nature of atherosclerosis, and emphasized the importance of maximal medical management including strict control of blood pressure, blood glucose, and lipid levels, obtaining regular exercise, and continued cessation of smoking.  The patient is aware that without maximal medical management the underlying atherosclerotic disease process will progress, limiting the benefit of any interventions.  The patient was given information about PAD including signs, symptoms, treatment, what symptoms should prompt the patient to seek immediate medical care, and risk reduction measures to take.  Clemon Chambers, RN, MSN, FNP-C [facility] Phone: [phone]  Clinic MD: Bishop Dublin  10/27/18 2:27 PM

## 2018-11-03 ENCOUNTER — Ambulatory Visit (HOSPITAL_COMMUNITY)
Admission: RE | Admit: 2018-11-03 | Discharge: 2018-11-03 | Disposition: A | Discharge status: Home / Self Care | Payer: Medicare Other | Source: Ambulatory Visit | Attending: Surgery | Admitting: Surgery

## 2018-11-03 ENCOUNTER — Other Ambulatory Visit: Payer: Self-pay

## 2018-11-03 ENCOUNTER — Encounter (HOSPITAL_COMMUNITY)
Admission: RE | Disposition: A | Discharge status: Home / Self Care | Payer: Self-pay | Source: Ambulatory Visit | Attending: Surgery

## 2018-11-03 DIAGNOSIS — M1990 Unspecified osteoarthritis, unspecified site: Secondary | ICD-10-CM | POA: Insufficient documentation

## 2018-11-03 DIAGNOSIS — Z885 Allergy status to narcotic agent status: Secondary | ICD-10-CM

## 2018-11-03 DIAGNOSIS — Z7902 Long term (current) use of antithrombotics/antiplatelets: Secondary | ICD-10-CM | POA: Insufficient documentation

## 2018-11-03 DIAGNOSIS — I739 Peripheral vascular disease, unspecified: Secondary | ICD-10-CM

## 2018-11-03 DIAGNOSIS — Z87891 Personal history of nicotine dependence: Secondary | ICD-10-CM | POA: Insufficient documentation

## 2018-11-03 DIAGNOSIS — Z7982 Long term (current) use of aspirin: Secondary | ICD-10-CM | POA: Insufficient documentation

## 2018-11-03 DIAGNOSIS — Z8249 Family history of ischemic heart disease and other diseases of the circulatory system: Secondary | ICD-10-CM | POA: Insufficient documentation

## 2018-11-03 DIAGNOSIS — J449 Chronic obstructive pulmonary disease, unspecified: Secondary | ICD-10-CM | POA: Insufficient documentation

## 2018-11-03 DIAGNOSIS — I10 Essential (primary) hypertension: Secondary | ICD-10-CM

## 2018-11-03 DIAGNOSIS — E7800 Pure hypercholesterolemia, unspecified: Secondary | ICD-10-CM | POA: Insufficient documentation

## 2018-11-03 DIAGNOSIS — I70211 Atherosclerosis of native arteries of extremities with intermittent claudication, right leg: Secondary | ICD-10-CM

## 2018-11-03 HISTORY — PX: PERIPHERAL VASCULAR BALLOON ANGIOPLASTY: CATH118281

## 2018-11-03 HISTORY — PX: LOWER EXTREMITY ANGIOGRAPHY: CATH118251

## 2018-11-03 LAB — POCT I-STAT, CHEM 8
BUN: 17 mg/dL (ref 8–23)
CALCIUM ION: 1.24 mmol/L (ref 1.15–1.40)
CREATININE: 0.7 mg/dL (ref 0.44–1.00)
Chloride: 104 mmol/L (ref 98–111)
Glucose, Bld: 86 mg/dL (ref 70–99)
HCT: 39 % (ref 36.0–46.0)
Hemoglobin: 13.3 g/dL (ref 12.0–15.0)
Potassium: 3.8 mmol/L (ref 3.5–5.1)
Sodium: 141 mmol/L (ref 135–145)
TCO2: 29 mmol/L (ref 22–32)

## 2018-11-03 LAB — POCT ACTIVATED CLOTTING TIME
ACTIVATED CLOTTING TIME: 241 s
ACTIVATED CLOTTING TIME: 180 s

## 2018-11-03 SURGERY — LOWER EXTREMITY ANGIOGRAPHY
Anesthesia: LOCAL | Laterality: Right

## 2018-11-03 MED ORDER — SODIUM CHLORIDE 0.9% FLUSH: 3.0000 mL | Freq: Two times a day (BID) | INTRAVENOUS | Status: DC

## 2018-11-03 MED ORDER — ONDANSETRON HCL 4 MG/2ML IJ SOLN: 4.0000 mg | Freq: Four times a day (QID) | INTRAMUSCULAR | Status: DC | PRN

## 2018-11-03 MED ORDER — HEPARIN (PORCINE) IN NACL 1000-0.9 UT/500ML-% IV SOLN
INTRAVENOUS | Status: DC | PRN
  Administered 2018-11-03 (×2): 500 mL

## 2018-11-03 MED ORDER — IODIXANOL 320 MG/ML IV SOLN
INTRAVENOUS | Status: DC | PRN
  Administered 2018-11-03: 105 mL via INTRA_ARTERIAL

## 2018-11-03 MED ORDER — HEPARIN SODIUM (PORCINE) 1000 UNIT/ML IJ SOLN
INTRAMUSCULAR | Status: DC | PRN
  Administered 2018-11-03: 5000 [IU] via INTRAVENOUS

## 2018-11-03 MED ORDER — SODIUM CHLORIDE 0.9 % IV SOLN
INTRAVENOUS | Status: DC
  Administered 2018-11-03: 06:00:00 via INTRAVENOUS

## 2018-11-03 MED ORDER — LABETALOL HCL 5 MG/ML IV SOLN: 10.0000 mg | INTRAVENOUS | Status: DC | PRN

## 2018-11-03 MED ORDER — SODIUM CHLORIDE 0.9 % WEIGHT BASED INFUSION: 1.0000 mL/kg/h | INTRAVENOUS | Status: DC

## 2018-11-03 MED ORDER — MIDAZOLAM HCL 2 MG/2ML IJ SOLN
INTRAMUSCULAR | Status: DC | PRN
  Administered 2018-11-03: 1 mg via INTRAVENOUS

## 2018-11-03 MED ORDER — ASPIRIN EC 81 MG PO TBEC: 81.0000 mg | DELAYED_RELEASE_TABLET | Freq: Every day | ORAL | Status: DC

## 2018-11-03 MED ORDER — HEPARIN (PORCINE) IN NACL 1000-0.9 UT/500ML-% IV SOLN
INTRAVENOUS | Status: AC
  Filled 2018-11-03: qty 1000

## 2018-11-03 MED ORDER — ASPIRIN 81 MG PO CHEW
CHEWABLE_TABLET | ORAL | Status: AC
  Administered 2018-11-03: 81 mg
  Filled 2018-11-03: qty 1

## 2018-11-03 MED ORDER — MIDAZOLAM HCL 2 MG/2ML IJ SOLN
INTRAMUSCULAR | Status: AC
  Filled 2018-11-03: qty 2

## 2018-11-03 MED ORDER — SODIUM CHLORIDE 0.9% FLUSH: 3.0000 mL | INTRAVENOUS | Status: DC | PRN

## 2018-11-03 MED ORDER — FENTANYL CITRATE (PF) 100 MCG/2ML IJ SOLN
INTRAMUSCULAR | Status: AC
  Filled 2018-11-03: qty 2

## 2018-11-03 MED ORDER — SODIUM CHLORIDE 0.9 % IV SOLN: 250.0000 mL | INTRAVENOUS | Status: DC | PRN

## 2018-11-03 MED ORDER — HYDRALAZINE HCL 20 MG/ML IJ SOLN: 5.0000 mg | INTRAMUSCULAR | Status: DC | PRN

## 2018-11-03 MED ORDER — FENTANYL CITRATE (PF) 100 MCG/2ML IJ SOLN
INTRAMUSCULAR | Status: DC | PRN
  Administered 2018-11-03: 25 ug via INTRAVENOUS

## 2018-11-03 MED ORDER — LIDOCAINE HCL (PF) 1 % IJ SOLN
INTRAMUSCULAR | Status: DC | PRN
  Administered 2018-11-03: 15 mL

## 2018-11-03 MED ORDER — LIDOCAINE HCL (PF) 1 % IJ SOLN
INTRAMUSCULAR | Status: AC
  Filled 2018-11-03: qty 30

## 2018-11-03 SURGICAL SUPPLY — 17 items
BALLN CHOCOLATE 4.0X40X135 (BALLOONS) ×3
BALLN STERLING OTW 5X40X135 (BALLOONS) ×3
BALLOON CHOCOLATE 4.0X40X135 (BALLOONS) ×2 IMPLANT
BALLOON STERLING OTW 5X40X135 (BALLOONS) ×2 IMPLANT
CATH OMNI FLUSH 5F 65CM (CATHETERS) ×6 IMPLANT
DEVICE CONTINUOUS FLUSH (MISCELLANEOUS) ×3 IMPLANT
KIT ENCORE 26 ADVANTAGE (KITS) ×3 IMPLANT
KIT MICROPUNCTURE NIT STIFF (SHEATH) ×3 IMPLANT
KIT PV (KITS) ×3 IMPLANT
SHEATH DESTINATION MP 5FR 45CM (SHEATH) ×3 IMPLANT
SHEATH PINNACLE 5F 10CM (SHEATH) ×3 IMPLANT
SHEATH PROBE COVER 6X72 (BAG) ×3 IMPLANT
SYR MEDRAD MARK V 150ML (SYRINGE) ×3 IMPLANT
TRANSDUCER W/STOPCOCK (MISCELLANEOUS) ×3 IMPLANT
TRAY PV CATH (CUSTOM PROCEDURE TRAY) ×3 IMPLANT
WIRE BENTSON .035X145CM (WIRE) ×3 IMPLANT
WIRE SPARTACORE .014X300CM (WIRE) ×3 IMPLANT

## 2018-11-03 NOTE — Op Note (Signed)
Patient name: Carla Dawson MRN: [id number] DOB: 1931-07-10 Sex: female  11/03/2018 Pre-operative Diagnosis: Severe right leg claudication Post-operative diagnosis:  Same Surgeon:  [name] Procedure Performed:  1.  Ultrasound-guided access, left femoral artery  2.  Abdominal aortogram  3.  Bilateral lower extremity runoff  4.  Angioplasty, right common femoral artery  5.  Conscious sedation (60 minutes)    Indications: The patient has a very complex past vascular history, most of which was done in [place].  She presents with new onset severe claudication in her right leg.  She has a history of a right femoral to TP trunk bypass graft.  She is here for further evaluation.  Procedure:  The patient was identified in the holding area and taken to room 8.  The patient was then placed supine on the table and prepped and draped in the usual sterile fashion.  A time out was called.  Conscious sedation was administered with use of IV fentanyl and Versed under continuous physician and nurse monitoring.  Heart rate, blood pressure, and oxygen saturation were continuously monitored.  Ultrasound was used to evaluate the left common femoral artery.  It was patent .  A digital ultrasound image was acquired.  A micropuncture needle was used to access the left common femoral artery under ultrasound guidance.  An 018 wire was advanced without resistance and a micropuncture sheath was placed.  The 018 wire was removed and a benson wire was placed.  The micropuncture sheath was exchanged for a 5 french sheath.  An omniflush catheter was advanced over the wire to the level of L-1.  An abdominal angiogram was obtained.  Next, the catheter was pulled down to the aortic bifurcation and bilateral runoff was performed. Findings:   Aortogram: No significant infrarenal aortic stenosis however the aorta is very small in caliber and calcified.  Only half of the right kidney fills with contrast.  No  significant renal artery stenosis was visualized.  Bilateral common and external iliac arteries are very small in caliber patent.  Right Lower Extremity: The right common femoral artery has approximately a 70% stenosis.  The profundofemoral artery is patent.  There is a bypass graft the common femoral to the tibioperoneal trunk which is widely patent.  Single-vessel via the peroneal artery is visualized  Left Lower Extremity: The left common femoral and profundofemoral artery are patent.  There does appear to be significant stenosis within the proximal common femoral artery however this may be related to spasm from the access.  Profundofemoral artery is patent.  There is a femoral to below-knee popliteal artery bypass graft which is widely patent two-vessel runoff via the peroneal and posterior tibial artery.  Intervention: After the above images were acquired the decision was made to proceed with intervention.  Historically, the patient has not done well with stenting, and she has already had multiple operations in each groin, therefore felt that balloon angioplasty was our most appropriate course even though it was in the common femoral artery.  The aortic bifurcation was crossed with an Omni Flush catheter and a Bentson wire.  A 5 French 45 cm sheath was advanced into the right external iliac artery.  The patient was fully heparinized.  ACT was 241.  I then used a 014 [place] core to cross the stenosis and the wire into the bypass graft.  I first like to do a 4 x 40 chocolate balloon performed balloon angioplasty for 2 minutes.  Follow-up imaging revealed improvement  in the stenosis however it was still a proximal 20% with a non-flow-limiting dissection.  I elected to then inserted a 5 x 40 Sterling balloon and take this to nominal pressure for 2 nights.  Follow-up was performed and shows residual dissection.  I then reinserted the 5 x 40 Sterling balloon and repeated balloon angioplasty at profile 3 minutes.   A final arteriogram was performed the revealed persistence of the dissection however it was not flow-limiting.  Did not want to place a stent in this area and so I have elected to leave this.  Wire and sheath were withdrawn and the sheath was left in the left external iliac artery.  The patient be taken the whole year for sheath pull once the coagulation profile corrects.  Impression:  #1 high-grade, greater than 70% stenosis within the right common femoral artery treated with Riemer balloon angioplasty with residual non-flow-limiting dissection.  #2 stenosis in the proximal left common femoral artery that could potentially be related to spasm evaluated with duplex ultrasound at her next office visit.   Theotis Burrow, M.D. [facility] Office: [phone] Pager:  [phone]

## 2018-11-03 NOTE — Progress Notes (Signed)
Site area: Left groin a 5 french long sheath was removed  Site Prior to Removal:  Level 0  Pressure Applied For 20 MINUTES    Bedrest Beginning at 1045am  Manual:   Yes.    Patient Status During Pull:  stable  Post Pull Groin Site:  Level 0  Post Pull Instructions Given:  Yes.    Post Pull Pulses Present:  Yes.    Dressing Applied:  Yes.    Comments:  VS  Remain stable

## 2018-11-03 NOTE — Progress Notes (Signed)
1450 ambulated patient to bathroom.  Patient voided.

## 2018-11-03 NOTE — Discharge Instructions (Signed)

## 2018-11-03 NOTE — Interval H&P Note (Signed)
History and Physical Interval Note:  11/03/2018 7:45 AM  Carla Dawson  has presented today for surgery, with the diagnosis of pvd w/claudication right leg  The various methods of treatment have been discussed with the patient and family. After consideration of risks, benefits and other options for treatment, the patient has consented to  Procedure(s): LOWER EXTREMITY ANGIOGRAPHY (N/A) as a surgical intervention .  The patient's history has been reviewed, patient examined, no change in status, stable for surgery.  I have reviewed the patient's chart and labs.  Questions were answered to the patient's satisfaction.     [name]

## 2018-11-04 ENCOUNTER — Inpatient Hospital Stay (HOSPITAL_COMMUNITY)
Admission: EM | Admit: 2018-11-04 | Discharge: 2018-11-05 | DRG: 271 | Disposition: A | Discharge status: Home / Self Care | Payer: Medicare Other | Attending: Surgery | Admitting: Surgery

## 2018-11-04 ENCOUNTER — Encounter (HOSPITAL_COMMUNITY)
Admission: EM | Disposition: A | Discharge status: Home / Self Care | Payer: Self-pay | Source: Home / Self Care | Attending: Surgery

## 2018-11-04 ENCOUNTER — Emergency Department (HOSPITAL_COMMUNITY): Payer: Medicare Other | Admitting: Anesthesiology

## 2018-11-04 ENCOUNTER — Telehealth: Payer: Self-pay | Admitting: Surgery

## 2018-11-04 ENCOUNTER — Emergency Department (HOSPITAL_BASED_OUTPATIENT_CLINIC_OR_DEPARTMENT_OTHER): Payer: Medicare Other

## 2018-11-04 ENCOUNTER — Encounter (HOSPITAL_COMMUNITY): Payer: Self-pay | Admitting: Surgery

## 2018-11-04 DIAGNOSIS — M79604 Pain in right leg: Secondary | ICD-10-CM | POA: Diagnosis not present

## 2018-11-04 DIAGNOSIS — J449 Chronic obstructive pulmonary disease, unspecified: Secondary | ICD-10-CM | POA: Diagnosis present

## 2018-11-04 DIAGNOSIS — I70201 Unspecified atherosclerosis of native arteries of extremities, right leg: Secondary | ICD-10-CM | POA: Diagnosis present

## 2018-11-04 DIAGNOSIS — I998 Other disorder of circulatory system: Secondary | ICD-10-CM | POA: Diagnosis not present

## 2018-11-04 DIAGNOSIS — T82858A Stenosis of vascular prosthetic devices, implants and grafts, initial encounter: Secondary | ICD-10-CM | POA: Diagnosis present

## 2018-11-04 DIAGNOSIS — I10 Essential (primary) hypertension: Secondary | ICD-10-CM | POA: Diagnosis present

## 2018-11-04 DIAGNOSIS — I739 Peripheral vascular disease, unspecified: Secondary | ICD-10-CM | POA: Diagnosis present

## 2018-11-04 DIAGNOSIS — Z7982 Long term (current) use of aspirin: Secondary | ICD-10-CM

## 2018-11-04 DIAGNOSIS — M19042 Primary osteoarthritis, left hand: Secondary | ICD-10-CM | POA: Diagnosis present

## 2018-11-04 DIAGNOSIS — E7800 Pure hypercholesterolemia, unspecified: Secondary | ICD-10-CM | POA: Diagnosis present

## 2018-11-04 DIAGNOSIS — M25561 Pain in right knee: Secondary | ICD-10-CM

## 2018-11-04 DIAGNOSIS — Z8261 Family history of arthritis: Secondary | ICD-10-CM | POA: Diagnosis not present

## 2018-11-04 DIAGNOSIS — M19041 Primary osteoarthritis, right hand: Secondary | ICD-10-CM | POA: Diagnosis present

## 2018-11-04 DIAGNOSIS — I7772 Dissection of iliac artery: Principal | ICD-10-CM | POA: Diagnosis present

## 2018-11-04 DIAGNOSIS — Z7902 Long term (current) use of antithrombotics/antiplatelets: Secondary | ICD-10-CM

## 2018-11-04 DIAGNOSIS — Z885 Allergy status to narcotic agent status: Secondary | ICD-10-CM

## 2018-11-04 DIAGNOSIS — R52 Pain, unspecified: Secondary | ICD-10-CM | POA: Diagnosis not present

## 2018-11-04 DIAGNOSIS — Z79899 Other long term (current) drug therapy: Secondary | ICD-10-CM

## 2018-11-04 DIAGNOSIS — Z8249 Family history of ischemic heart disease and other diseases of the circulatory system: Secondary | ICD-10-CM

## 2018-11-04 DIAGNOSIS — Z79891 Long term (current) use of opiate analgesic: Secondary | ICD-10-CM

## 2018-11-04 DIAGNOSIS — Z87891 Personal history of nicotine dependence: Secondary | ICD-10-CM | POA: Diagnosis not present

## 2018-11-04 DIAGNOSIS — I4891 Unspecified atrial fibrillation: Secondary | ICD-10-CM | POA: Diagnosis present

## 2018-11-04 DIAGNOSIS — M79661 Pain in right lower leg: Secondary | ICD-10-CM | POA: Diagnosis not present

## 2018-11-04 DIAGNOSIS — M79606 Pain in leg, unspecified: Secondary | ICD-10-CM | POA: Diagnosis not present

## 2018-11-04 DIAGNOSIS — I70211 Atherosclerosis of native arteries of extremities with intermittent claudication, right leg: Secondary | ICD-10-CM | POA: Diagnosis present

## 2018-11-04 DIAGNOSIS — G8918 Other acute postprocedural pain: Secondary | ICD-10-CM | POA: Diagnosis not present

## 2018-11-04 HISTORY — PX: PATCH ANGIOPLASTY: SHX6230

## 2018-11-04 HISTORY — PX: ENDARTERECTOMY FEMORAL: SHX5804

## 2018-11-04 HISTORY — PX: THROMBECTOMY ILIAC ARTERY: SHX6405

## 2018-11-04 LAB — TYPE AND SCREEN
ABO/RH(D): B POS
Antibody Screen: NEGATIVE

## 2018-11-04 LAB — ABO/RH: ABO/RH(D): B POS

## 2018-11-04 LAB — PROTIME-INR
INR: 1.02
Prothrombin Time: 13.3 seconds (ref 11.4–15.2)

## 2018-11-04 LAB — CBC WITH DIFFERENTIAL/PLATELET
Abs Immature Granulocytes: 0.02 10*3/uL (ref 0.00–0.07)
BASOS ABS: 0 10*3/uL (ref 0.0–0.1)
Basophils Relative: 1 %
EOS ABS: 0.2 10*3/uL (ref 0.0–0.5)
Eosinophils Relative: 3 %
HEMATOCRIT: 36.4 % (ref 36.0–46.0)
Hemoglobin: 11.7 g/dL — ABNORMAL LOW (ref 12.0–15.0)
Immature Granulocytes: 0 %
LYMPHS ABS: 1.3 10*3/uL (ref 0.7–4.0)
Lymphocytes Relative: 18 %
MCH: 29.5 pg (ref 26.0–34.0)
MCHC: 32.1 g/dL (ref 30.0–36.0)
MCV: 91.7 fL (ref 80.0–100.0)
Monocytes Absolute: 0.8 10*3/uL (ref 0.1–1.0)
Monocytes Relative: 11 %
NRBC: 0 % (ref 0.0–0.2)
Neutro Abs: 5 10*3/uL (ref 1.7–7.7)
Neutrophils Relative %: 67 %
Platelets: 174 10*3/uL (ref 150–400)
RBC: 3.97 MIL/uL (ref 3.87–5.11)
RDW: 13.1 % (ref 11.5–15.5)
WBC: 7.3 10*3/uL (ref 4.0–10.5)

## 2018-11-04 LAB — COMPREHENSIVE METABOLIC PANEL
ALBUMIN: 3.2 g/dL — AB (ref 3.5–5.0)
ALT: 11 U/L (ref 0–44)
ANION GAP: 8 (ref 5–15)
AST: 24 U/L (ref 15–41)
Alkaline Phosphatase: 62 U/L (ref 38–126)
BUN: 10 mg/dL (ref 8–23)
CHLORIDE: 105 mmol/L (ref 98–111)
CO2: 26 mmol/L (ref 22–32)
Calcium: 9 mg/dL (ref 8.9–10.3)
Creatinine, Ser: 0.74 mg/dL (ref 0.44–1.00)
GFR calc Af Amer: >60 mL/min (ref >60)
GFR calc non Af Amer: >60 mL/min (ref >60)
GLUCOSE: 95 mg/dL (ref 70–99)
POTASSIUM: 3.8 mmol/L (ref 3.5–5.1)
SODIUM: 139 mmol/L (ref 135–145)
Total Bilirubin: 0.7 mg/dL (ref 0.3–1.2)
Total Protein: 5.2 g/dL — ABNORMAL LOW (ref 6.5–8.1)

## 2018-11-04 SURGERY — ENDARTERECTOMY, FEMORAL
Anesthesia: General | Site: Groin | Laterality: Right

## 2018-11-04 MED ORDER — DM-GUAIFENESIN ER 30-600 MG PO TB12
1.0000 | ORAL_TABLET | Freq: Two times a day (BID) | ORAL | Status: DC
  Filled 2018-11-04 (×2): qty 1

## 2018-11-04 MED ORDER — PROPOFOL 10 MG/ML IV BOLUS
INTRAVENOUS | Status: DC | PRN
  Administered 2018-11-04: 80 mg via INTRAVENOUS

## 2018-11-04 MED ORDER — POTASSIUM CHLORIDE CRYS ER 20 MEQ PO TBCR: 20.0000 meq | EXTENDED_RELEASE_TABLET | Freq: Every day | ORAL | Status: DC | PRN

## 2018-11-04 MED ORDER — SUGAMMADEX SODIUM 200 MG/2ML IV SOLN
INTRAVENOUS | Status: DC | PRN
  Administered 2018-11-04: 100 mg via INTRAVENOUS

## 2018-11-04 MED ORDER — TRAMADOL HCL 50 MG PO TABS
50.0000 mg | ORAL_TABLET | Freq: Four times a day (QID) | ORAL | Status: DC | PRN
  Administered 2018-11-04 – 2018-11-05 (×2): 50 mg via ORAL
  Filled 2018-11-04 (×2): qty 1

## 2018-11-04 MED ORDER — SUGAMMADEX SODIUM 200 MG/2ML IV SOLN
INTRAVENOUS | Status: AC
  Filled 2018-11-04: qty 2

## 2018-11-04 MED ORDER — HEMOSTATIC AGENTS (NO CHARGE) OPTIME
TOPICAL | Status: DC | PRN
  Administered 2018-11-04: 1 via TOPICAL

## 2018-11-04 MED ORDER — LABETALOL HCL 5 MG/ML IV SOLN: 10.0000 mg | INTRAVENOUS | Status: DC | PRN

## 2018-11-04 MED ORDER — SODIUM CHLORIDE (PF) 0.9 % IJ SOLN
INTRAMUSCULAR | Status: AC
  Filled 2018-11-04: qty 10

## 2018-11-04 MED ORDER — FAMOTIDINE 20 MG PO TABS
20.0000 mg | ORAL_TABLET | Freq: Every day | ORAL | Status: DC
  Administered 2018-11-05: 20 mg via ORAL
  Filled 2018-11-04: qty 1

## 2018-11-04 MED ORDER — DEXTROMETHORPHAN POLISTIREX ER 30 MG/5ML PO SUER: 30.0000 mg | Freq: Two times a day (BID) | ORAL | Status: DC | PRN

## 2018-11-04 MED ORDER — UMECLIDINIUM BROMIDE 62.5 MCG/INH IN AEPB
1.0000 | INHALATION_SPRAY | Freq: Every day | RESPIRATORY_TRACT | Status: DC
  Administered 2018-11-05: 1 via RESPIRATORY_TRACT
  Filled 2018-11-04 (×2): qty 7

## 2018-11-04 MED ORDER — SENNOSIDES-DOCUSATE SODIUM 8.6-50 MG PO TABS: 1.0000 | ORAL_TABLET | Freq: Every evening | ORAL | Status: DC | PRN

## 2018-11-04 MED ORDER — SUCCINYLCHOLINE CHLORIDE 200 MG/10ML IV SOSY
PREFILLED_SYRINGE | INTRAVENOUS | Status: AC
  Filled 2018-11-04: qty 10

## 2018-11-04 MED ORDER — ALUM & MAG HYDROXIDE-SIMETH 200-200-20 MG/5ML PO SUSP: 15.0000 mL | ORAL | Status: DC | PRN

## 2018-11-04 MED ORDER — PHENYLEPHRINE 40 MCG/ML (10ML) SYRINGE FOR IV PUSH (FOR BLOOD PRESSURE SUPPORT)
PREFILLED_SYRINGE | INTRAVENOUS | Status: AC
  Filled 2018-11-04: qty 30

## 2018-11-04 MED ORDER — ATORVASTATIN CALCIUM 10 MG PO TABS: 10.0000 mg | ORAL_TABLET | Freq: Every day | ORAL | Status: DC

## 2018-11-04 MED ORDER — SUCCINYLCHOLINE CHLORIDE 200 MG/10ML IV SOSY
PREFILLED_SYRINGE | INTRAVENOUS | Status: DC | PRN
  Administered 2018-11-04: 60 mg via INTRAVENOUS

## 2018-11-04 MED ORDER — LIDOCAINE 2% (20 MG/ML) 5 ML SYRINGE
INTRAMUSCULAR | Status: AC
  Filled 2018-11-04: qty 15

## 2018-11-04 MED ORDER — HYDRALAZINE HCL 20 MG/ML IJ SOLN: 5.0000 mg | INTRAMUSCULAR | Status: DC | PRN

## 2018-11-04 MED ORDER — HEPARIN SODIUM (PORCINE) 1000 UNIT/ML IJ SOLN
INTRAMUSCULAR | Status: AC
  Filled 2018-11-04: qty 2

## 2018-11-04 MED ORDER — ROCURONIUM BROMIDE 10 MG/ML (PF) SYRINGE
PREFILLED_SYRINGE | INTRAVENOUS | Status: DC | PRN
  Administered 2018-11-04: 50 mg via INTRAVENOUS

## 2018-11-04 MED ORDER — ALBUTEROL SULFATE (2.5 MG/3ML) 0.083% IN NEBU: 2.5000 mg | INHALATION_SOLUTION | RESPIRATORY_TRACT | Status: DC | PRN

## 2018-11-04 MED ORDER — CEFAZOLIN SODIUM 1 G IJ SOLR
INTRAMUSCULAR | Status: AC
  Filled 2018-11-04: qty 20

## 2018-11-04 MED ORDER — SODIUM CHLORIDE 0.9 % IV SOLN: 500.0000 mL | Freq: Once | INTRAVENOUS | Status: DC | PRN

## 2018-11-04 MED ORDER — ADULT MULTIVITAMIN W/MINERALS CH
1.0000 | ORAL_TABLET | Freq: Every evening | ORAL | Status: DC
  Administered 2018-11-05: 1 via ORAL
  Filled 2018-11-04: qty 1

## 2018-11-04 MED ORDER — ENOXAPARIN SODIUM 30 MG/0.3ML ~~LOC~~ SOLN
30.0000 mg | SUBCUTANEOUS | Status: DC
  Administered 2018-11-05: 30 mg via SUBCUTANEOUS
  Filled 2018-11-04: qty 0.3

## 2018-11-04 MED ORDER — LACTATED RINGERS IV SOLN
INTRAVENOUS | Status: DC | PRN
  Administered 2018-11-04 (×2): via INTRAVENOUS

## 2018-11-04 MED ORDER — SODIUM CHLORIDE 0.9 % IV SOLN
INTRAVENOUS | Status: DC | PRN
  Administered 2018-11-04: 500 mL

## 2018-11-04 MED ORDER — CLOPIDOGREL BISULFATE 75 MG PO TABS
75.0000 mg | ORAL_TABLET | Freq: Every day | ORAL | Status: DC
  Administered 2018-11-05: 75 mg via ORAL
  Filled 2018-11-04: qty 1

## 2018-11-04 MED ORDER — HYDROMORPHONE HCL 1 MG/ML IJ SOLN: 0.2500 mg | INTRAMUSCULAR | Status: DC | PRN

## 2018-11-04 MED ORDER — PROTAMINE SULFATE 10 MG/ML IV SOLN
INTRAVENOUS | Status: DC | PRN
  Administered 2018-11-04: 50 mg via INTRAVENOUS

## 2018-11-04 MED ORDER — LIDOCAINE 2% (20 MG/ML) 5 ML SYRINGE
INTRAMUSCULAR | Status: DC | PRN
  Administered 2018-11-04: 60 mg via INTRAVENOUS

## 2018-11-04 MED ORDER — PHENOL 1.4 % MT LIQD: 1.0000 | OROMUCOSAL | Status: DC | PRN

## 2018-11-04 MED ORDER — GUAIFENESIN-DM 100-10 MG/5ML PO SYRP: 15.0000 mL | ORAL_SOLUTION | ORAL | Status: DC | PRN

## 2018-11-04 MED ORDER — MAGNESIUM SULFATE 2 GM/50ML IV SOLN: 2.0000 g | Freq: Every day | INTRAVENOUS | Status: DC | PRN

## 2018-11-04 MED ORDER — LORATADINE 10 MG PO TABS
10.0000 mg | ORAL_TABLET | Freq: Every day | ORAL | Status: DC
  Filled 2018-11-04: qty 1

## 2018-11-04 MED ORDER — FENTANYL CITRATE (PF) 100 MCG/2ML IJ SOLN
25.0000 ug | INTRAMUSCULAR | Status: DC | PRN
  Administered 2018-11-04 (×2): 25 ug via INTRAVENOUS

## 2018-11-04 MED ORDER — GABAPENTIN 100 MG PO CAPS
100.0000 mg | ORAL_CAPSULE | Freq: Three times a day (TID) | ORAL | Status: DC
  Administered 2018-11-05: 100 mg via ORAL
  Filled 2018-11-04 (×2): qty 1

## 2018-11-04 MED ORDER — 0.9 % SODIUM CHLORIDE (POUR BTL) OPTIME
TOPICAL | Status: DC | PRN
  Administered 2018-11-04: 2000 mL

## 2018-11-04 MED ORDER — PREDNISONE 20 MG PO TABS: 20.0000 mg | ORAL_TABLET | Freq: Every day | ORAL | Status: DC

## 2018-11-04 MED ORDER — SODIUM CHLORIDE 0.9 % IV SOLN
INTRAVENOUS | Status: DC
  Administered 2018-11-04: 21:00:00 via INTRAVENOUS

## 2018-11-04 MED ORDER — SODIUM CHLORIDE 0.9 % IV SOLN
INTRAVENOUS | Status: AC
  Filled 2018-11-04: qty 1.2

## 2018-11-04 MED ORDER — DEXAMETHASONE SODIUM PHOSPHATE 10 MG/ML IJ SOLN
INTRAMUSCULAR | Status: AC
  Filled 2018-11-04: qty 1

## 2018-11-04 MED ORDER — ONDANSETRON HCL 4 MG/2ML IJ SOLN
INTRAMUSCULAR | Status: DC | PRN
  Administered 2018-11-04: 4 mg via INTRAVENOUS

## 2018-11-04 MED ORDER — VECURONIUM BROMIDE 10 MG IV SOLR
INTRAVENOUS | Status: DC | PRN
  Administered 2018-11-04 (×2): 1 mg via INTRAVENOUS
  Administered 2018-11-04: 2 mg via INTRAVENOUS

## 2018-11-04 MED ORDER — ROCURONIUM BROMIDE 50 MG/5ML IV SOSY
PREFILLED_SYRINGE | INTRAVENOUS | Status: AC
  Filled 2018-11-04: qty 5

## 2018-11-04 MED ORDER — FENTANYL CITRATE (PF) 100 MCG/2ML IJ SOLN
INTRAMUSCULAR | Status: AC
  Filled 2018-11-04: qty 2

## 2018-11-04 MED ORDER — DOCUSATE SODIUM 100 MG PO CAPS
100.0000 mg | ORAL_CAPSULE | Freq: Every day | ORAL | Status: DC
  Administered 2018-11-05: 100 mg via ORAL
  Filled 2018-11-04: qty 1

## 2018-11-04 MED ORDER — ONDANSETRON HCL 4 MG/2ML IJ SOLN: 4.0000 mg | Freq: Four times a day (QID) | INTRAMUSCULAR | Status: DC | PRN

## 2018-11-04 MED ORDER — FENTANYL CITRATE (PF) 250 MCG/5ML IJ SOLN
INTRAMUSCULAR | Status: AC
  Filled 2018-11-04: qty 5

## 2018-11-04 MED ORDER — HEPARIN SODIUM (PORCINE) 1000 UNIT/ML IJ SOLN
INTRAMUSCULAR | Status: DC | PRN
  Administered 2018-11-04: 5000 [IU] via INTRAVENOUS
  Administered 2018-11-04: 1000 [IU] via INTRAVENOUS

## 2018-11-04 MED ORDER — PHENYLEPHRINE 40 MCG/ML (10ML) SYRINGE FOR IV PUSH (FOR BLOOD PRESSURE SUPPORT)
PREFILLED_SYRINGE | INTRAVENOUS | Status: DC | PRN
  Administered 2018-11-04 (×2): 80 ug via INTRAVENOUS

## 2018-11-04 MED ORDER — CEFAZOLIN SODIUM-DEXTROSE 2-3 GM-%(50ML) IV SOLR
INTRAVENOUS | Status: DC | PRN
  Administered 2018-11-04: 2 g via INTRAVENOUS

## 2018-11-04 MED ORDER — CEFAZOLIN SODIUM-DEXTROSE 2-4 GM/100ML-% IV SOLN
2.0000 g | Freq: Three times a day (TID) | INTRAVENOUS | Status: AC
  Administered 2018-11-04 – 2018-11-05 (×2): 2 g via INTRAVENOUS
  Filled 2018-11-04 (×2): qty 100

## 2018-11-04 MED ORDER — MOMETASONE FURO-FORMOTEROL FUM 200-5 MCG/ACT IN AERO
2.0000 | INHALATION_SPRAY | Freq: Two times a day (BID) | RESPIRATORY_TRACT | Status: DC
  Administered 2018-11-05: 2 via RESPIRATORY_TRACT
  Filled 2018-11-04 (×2): qty 8.8

## 2018-11-04 MED ORDER — BISACODYL 5 MG PO TBEC: 5.0000 mg | DELAYED_RELEASE_TABLET | Freq: Every day | ORAL | Status: DC | PRN

## 2018-11-04 MED ORDER — PANTOPRAZOLE SODIUM 40 MG PO TBEC
40.0000 mg | DELAYED_RELEASE_TABLET | Freq: Every day | ORAL | Status: DC
  Administered 2018-11-04: 40 mg via ORAL
  Filled 2018-11-04: qty 1

## 2018-11-04 MED ORDER — DEXAMETHASONE SODIUM PHOSPHATE 10 MG/ML IJ SOLN
INTRAMUSCULAR | Status: DC | PRN
  Administered 2018-11-04: 10 mg via INTRAVENOUS

## 2018-11-04 MED ORDER — METOPROLOL TARTRATE 5 MG/5ML IV SOLN: 2.0000 mg | INTRAVENOUS | Status: DC | PRN

## 2018-11-04 MED ORDER — ONDANSETRON HCL 4 MG/2ML IJ SOLN
INTRAMUSCULAR | Status: AC
  Filled 2018-11-04: qty 2

## 2018-11-04 MED ORDER — PROTAMINE SULFATE 10 MG/ML IV SOLN
INTRAVENOUS | Status: AC
  Filled 2018-11-04: qty 5

## 2018-11-04 MED ORDER — FENTANYL CITRATE (PF) 100 MCG/2ML IJ SOLN: 50.0000 ug | Freq: Once | INTRAMUSCULAR | Status: DC

## 2018-11-04 MED ORDER — VECURONIUM BROMIDE 10 MG IV SOLR
INTRAVENOUS | Status: AC
  Filled 2018-11-04: qty 10

## 2018-11-04 MED ORDER — FENTANYL CITRATE (PF) 250 MCG/5ML IJ SOLN
INTRAMUSCULAR | Status: DC | PRN
  Administered 2018-11-04 (×2): 25 ug via INTRAVENOUS
  Administered 2018-11-04 (×2): 50 ug via INTRAVENOUS

## 2018-11-04 MED ORDER — ASPIRIN EC 81 MG PO TBEC
81.0000 mg | DELAYED_RELEASE_TABLET | Freq: Every day | ORAL | Status: DC
  Administered 2018-11-05: 81 mg via ORAL
  Filled 2018-11-04 (×2): qty 1

## 2018-11-04 MED ORDER — AMLODIPINE BESYLATE 5 MG PO TABS
5.0000 mg | ORAL_TABLET | Freq: Every day | ORAL | Status: DC
  Administered 2018-11-05: 5 mg via ORAL
  Filled 2018-11-04: qty 1

## 2018-11-04 MED ORDER — SODIUM CHLORIDE 0.9 % IV SOLN
INTRAVENOUS | Status: DC | PRN
  Administered 2018-11-04: 25 ug/min via INTRAVENOUS

## 2018-11-04 MED ORDER — PREDNISONE 20 MG PO TABS
20.0000 mg | ORAL_TABLET | Freq: Every day | ORAL | Status: DC
  Administered 2018-11-05: 20 mg via ORAL
  Filled 2018-11-04: qty 1

## 2018-11-04 SURGICAL SUPPLY — 48 items
CANISTER SUCT 3000ML PPV (MISCELLANEOUS) ×3 IMPLANT
CATH EMB 3FR 80CM (CATHETERS) ×3 IMPLANT
CATH EMB 4FR 80CM (CATHETERS) ×6 IMPLANT
CLIP VESOCCLUDE MED 24/CT (CLIP) ×3 IMPLANT
CLIP VESOCCLUDE SM WIDE 24/CT (CLIP) ×3 IMPLANT
COVER WAND RF STERILE (DRAPES) ×3 IMPLANT
DERMABOND ADVANCED (GAUZE/BANDAGES/DRESSINGS) ×2
DERMABOND ADVANCED .7 DNX12 (GAUZE/BANDAGES/DRESSINGS) ×1 IMPLANT
DRAIN CHANNEL 15F RND FF W/TCR (WOUND CARE) IMPLANT
DRAPE X-RAY CASS 24X20 (DRAPES) IMPLANT
DRESSING PREVENA PLUS CUSTOM (GAUZE/BANDAGES/DRESSINGS) ×1 IMPLANT
DRSG PREVENA PLUS CUSTOM (GAUZE/BANDAGES/DRESSINGS) ×3
ELECT REM PT RETURN 9FT ADLT (ELECTROSURGICAL) ×3
ELECTRODE REM PT RTRN 9FT ADLT (ELECTROSURGICAL) ×1 IMPLANT
EVACUATOR SILICONE 100CC (DRAIN) IMPLANT
GLOVE BIOGEL PI IND STRL 7.5 (GLOVE) ×1 IMPLANT
GLOVE BIOGEL PI INDICATOR 7.5 (GLOVE) ×2
GLOVE SURG SS PI 7.5 STRL IVOR (GLOVE) ×3 IMPLANT
GOWN STRL REUS W/ TWL LRG LVL3 (GOWN DISPOSABLE) ×3 IMPLANT
GOWN STRL REUS W/ TWL XL LVL3 (GOWN DISPOSABLE) ×1 IMPLANT
GOWN STRL REUS W/TWL LRG LVL3 (GOWN DISPOSABLE) ×6
GOWN STRL REUS W/TWL XL LVL3 (GOWN DISPOSABLE) ×2
HEMOSTAT SNOW SURGICEL 2X4 (HEMOSTASIS) ×3 IMPLANT
KIT BASIN OR (CUSTOM PROCEDURE TRAY) ×3 IMPLANT
KIT PREVENA INCISION MGT 13 (CANNISTER) ×3 IMPLANT
KIT TURNOVER KIT B (KITS) ×3 IMPLANT
NS IRRIG 1000ML POUR BTL (IV SOLUTION) ×6 IMPLANT
PACK PERIPHERAL VASCULAR (CUSTOM PROCEDURE TRAY) ×3 IMPLANT
PAD ARMBOARD 7.5X6 YLW CONV (MISCELLANEOUS) ×6 IMPLANT
PATCH VASC XENOSURE 1CMX6CM (Vascular Products) ×2 IMPLANT
PATCH VASC XENOSURE 1X6 (Vascular Products) ×1 IMPLANT
SET COLLECT BLD 21X3/4 12 (NEEDLE) IMPLANT
SPONGE LAP 18X18 RF (DISPOSABLE) ×3 IMPLANT
STOPCOCK 4 WAY LG BORE MALE ST (IV SETS) ×3 IMPLANT
SUT ETHILON 3 0 PS 1 (SUTURE) IMPLANT
SUT PROLENE 5 0 C 1 24 (SUTURE) ×3 IMPLANT
SUT PROLENE 6 0 BV (SUTURE) ×6 IMPLANT
SUT VIC AB 2-0 CT1 27 (SUTURE) ×2
SUT VIC AB 2-0 CT1 TAPERPNT 27 (SUTURE) ×1 IMPLANT
SUT VIC AB 3-0 SH 27 (SUTURE) ×2
SUT VIC AB 3-0 SH 27X BRD (SUTURE) ×1 IMPLANT
SUT VICRYL 4-0 PS2 18IN ABS (SUTURE) ×3 IMPLANT
SYR 3ML LL SCALE MARK (SYRINGE) ×6 IMPLANT
TOWEL GREEN STERILE (TOWEL DISPOSABLE) ×3 IMPLANT
TRAY FOLEY CATH 16FRSI W/METER (SET/KITS/TRAYS/PACK) ×3 IMPLANT
TUBING EXTENTION W/L.L. (IV SETS) IMPLANT
UNDERPAD 30X30 (UNDERPADS AND DIAPERS) ×3 IMPLANT
WATER STERILE IRR 1000ML POUR (IV SOLUTION) ×3 IMPLANT

## 2018-11-04 NOTE — Transfer of Care (Signed)
Immediate Anesthesia Transfer of Care Note  Patient: Carla Dawson  Procedure(s) Performed: ENDARTERECTOMY ILIO-FEMORAL ENDARTECTOMY (Right Groin) PATCH ANGIOPLASTY WITH XENOSURE PATCH (Right Groin) THROMBECTOMY RIGHT ILIO AND FEMORAL-TIBIAL BYPASS (Right Groin)  Patient Location: PACU  Anesthesia Type:General  Level of Consciousness: awake, alert , oriented and patient cooperative  Airway & Oxygen Therapy: Patient Spontanous Breathing and Patient connected to nasal cannula oxygen  Post-op Assessment: Report given to RN and Post -op Vital signs reviewed and stable  Post vital signs: Reviewed and stable  Last Vitals:  Vitals Value Taken Time  BP 122/50 11/04/2018  6:07 PM  Temp    Pulse 82 11/04/2018  6:09 PM  Resp 17 11/04/2018  6:09 PM  SpO2 100 % 11/04/2018  6:09 PM  Vitals shown include unvalidated device data.  Last Pain:  Vitals:   11/04/18 1013  TempSrc: Oral  PainSc:          Complications: No apparent anesthesia complications

## 2018-11-04 NOTE — ED Provider Notes (Signed)
Emergency Department Provider Note   I have reviewed the triage vital signs and the nursing notes.   HISTORY  Chief Complaint Post-op Problem   HPI Carla Dawson is a 84 y.o. female who had a balloon angioplasty yesterday with a small dissection the presents today with worsening claudication symptoms this morning and generalized pain in her leg it does not seem to improve.  To start about 3 hours prior to arrival.  No recent falls or trauma.  No other associated symptoms.  No weakness.  No other associated or modifying symptoms.    Past Medical History:  Diagnosis Date  . Allergy    "maybe seasonal allergies" (07/08/2017)  . Arthritis    "hands" (07/08/2017)  . Chicken pox   . COPD (chronic obstructive pulmonary disease) ([place])   . High cholesterol    "took me off pills ~ 1-2 months ago" (07/08/2017)  . History of blood transfusion 2017   "when I had blood clot in my leg"  . Hypertension   . PAD (peripheral artery disease) ([place])    severe/notes 10/14/2016  . Pneumonia ~ 2015  . PVD (peripheral vascular disease) [facility])     Patient Active Problem List   Diagnosis Date Noted  . Femoral artery occlusion, right ([place]) 11/04/2018  . Senile ecchymosis 07/20/2018  . S/P femoral-popliteal bypass surgery 04/12/2018  . Claudication of right lower extremity ([place]) 04/09/2018  . Ischemia of extremity 04/09/2018  . COPD with acute exacerbation ([place]) 03/23/2018  . Hyperlipidemia 02/24/2018  . Abnormal CXR 10/26/2016  . COPD  GOLD III  10/07/2016  . Essential hypertension, benign 10/07/2016  . PAD (peripheral artery disease) ([place]) 10/07/2016  . Neuropathy due to peripheral vascular disease ([place]) 10/07/2016    Past Surgical History:  Procedure Laterality Date  . ABDOMINAL AORTOGRAM N/A 04/22/2017   Procedure: Abdominal Aortogram;  Surgeon: Serafina Mitchell, MD;  Location: [place] [facility];  Service: Cardiovascular;  Laterality: N/A;  . ABDOMINAL AORTOGRAM W/LOWER  EXTREMITY N/A 03/04/2017   Procedure: Abdominal Aortogram w/Lower Extremity;  Surgeon: Serafina Mitchell, MD;  Location: [place] [facility];  Service: Cardiovascular;  Laterality: N/A;  . ABDOMINAL AORTOGRAM W/LOWER EXTREMITY N/A 07/08/2017   Procedure: Abdominal Aortogram w/Lower Extremity;  Surgeon: Serafina Mitchell, MD;  Location: [place] [facility];  Service: Cardiovascular;  Laterality: N/A;  . BIOPSY THYROID    . CATARACT EXTRACTION W/ INTRAOCULAR LENS  IMPLANT, BILATERAL Bilateral   . EMBOLECTOMY Right 04/09/2018   Procedure: RIGHT COMMON FEMORAL ENDARTERECTOMY  WITH BOVINE PATCH ANGIOPLASTY; RIGHT LOWER LEG THROMBOEMBOLECTOMY INTRAOPERATIVE ARTERIOGRAM;  Surgeon: Waynetta Sandy, MD;  Location: [facility] [facility];  Service: Vascular;  Laterality: Right;  . FEMORAL-POPLITEAL BYPASS GRAFT Left 04/12/2018   Procedure: Vein Patch Angioplasty Below Knee Popliteal Artery; Left Femoral Endarterectomy with Profundaplasty; Left Femoral-Below Knee Popliteal Bypass Graft using a 87mm by 80cm Thin Wall Propaten Gortex Graft;  Surgeon: Elam Dutch, MD;  Location: [facility];  Service: Vascular;  Laterality: Left;  . HEMATOMA EVACUATION Right 2017   "S/P procedure; discharged; had to come back for emergency OR"  . LOWER EXTREMITY ANGIOGRAPHY Bilateral 04/22/2017   Procedure: Lower Extremity Angiography;  Surgeon: Serafina Mitchell, MD;  Location: [place] [facility];  Service: Cardiovascular;  Laterality: Bilateral;  . LOWER EXTREMITY ANGIOGRAPHY Bilateral 04/07/2018   Procedure: LOWER EXTREMITY ANGIOGRAPHY;  Surgeon: Serafina Mitchell, MD;  Location: [place] [facility];  Service: Cardiovascular;  Laterality: Bilateral;  . LOWER EXTREMITY ANGIOGRAPHY N/A  11/03/2018   Procedure: LOWER EXTREMITY ANGIOGRAPHY;  Surgeon: Serafina Mitchell, MD;  Location: [place] [facility];  Service: Cardiovascular;  Laterality: N/A;  . PERIPHERAL VASCULAR BALLOON ANGIOPLASTY Left 03/04/2017   Procedure: Peripheral Vascular Balloon  Angioplasty;  Surgeon: Serafina Mitchell, MD;  Location: [place] [facility];  Service: Cardiovascular;  Laterality: Left;  SFA and PT  . PERIPHERAL VASCULAR BALLOON ANGIOPLASTY Right 11/03/2018   Procedure: PERIPHERAL VASCULAR BALLOON ANGIOPLASTY;  Surgeon: Serafina Mitchell, MD;  Location: [place] [facility];  Service: Cardiovascular;  Laterality: Right;  common femoral  . PERIPHERAL VASCULAR INTERVENTION  07/08/2017  . PERIPHERAL VASCULAR INTERVENTION  07/08/2017   Procedure: Peripheral Vascular Intervention;  Surgeon: Serafina Mitchell, MD;  Location: [place] [facility];  Service: Cardiovascular;;  . PERIPHERAL VASCULAR INTERVENTION Left 04/07/2018   Procedure: PERIPHERAL VASCULAR INTERVENTION;  Surgeon: Serafina Mitchell, MD;  Location: [place] [facility];  Service: Cardiovascular;  Laterality: Left;  Marland Kitchen VAGINAL HYSTERECTOMY    . VEIN BYPASS SURGERY      Current Outpatient Rx  . Order #: [id number] Class: Print    Allergies Lortab [hydrocodone-acetaminophen] and Morphine and related  Family History  Problem Relation Age of Onset  . Arthritis Mother   . Hypertension Mother   . Arthritis Father   . Hypertension Father   . Cancer Brother   . Hypertension Son     Social History Social History   Tobacco Use  . Smoking status: Former Smoker    Packs/day: 1.50    Years: 52.00    Pack years: 78.00    Start date: 12/09/1953    Last attempt to quit: 12/09/2005    Years since quitting: 12.9  . Smokeless tobacco: Never Used  Substance Use Topics  . Alcohol use: No  . Drug use: No    Review of Systems  All other systems negative except as documented in the HPI. All pertinent positives and negatives as reviewed in the HPI. ____________________________________________   PHYSICAL EXAM:  VITAL SIGNS: ED Triage Vitals  Enc Vitals Group     BP      Pulse      Resp      Temp      Temp src      SpO2      Weight      Height      Head Circumference      Peak Flow      Pain Score       Pain Loc      Pain Edu?      Excl. in [place]?     Constitutional: Alert and oriented. Well appearing and in no acute distress. Eyes: Conjunctivae are normal. PERRL. EOMI. Head: Atraumatic. Nose: No congestion/rhinnorhea. Mouth/Throat: Mucous membranes are moist.  Oropharynx non-erythematous. Neck: No stridor.  No meningeal signs.   Cardiovascular: Normal rate, regular rhythm.  Cannot Doppler pulses in right foot.  Right foot is much colder than left foot and pale.  Grossly normal heart sounds.   Respiratory: Normal respiratory effort.  No retractions. Lungs CTAB. Gastrointestinal: Soft and nontender. No distention.  Musculoskeletal: No lower extremity tenderness nor edema. No gross deformities of extremities. Neurologic:  Normal speech and language. No gross focal neurologic deficits are appreciated.  Skin:  Skin is warm, dry and intact. No rash noted.   ____________________________________________   LABS (all labs ordered are listed, but only abnormal results are displayed)  Labs Reviewed  CBC WITH DIFFERENTIAL/PLATELET - Abnormal; Notable  for the following components:      Result Value   Hemoglobin 11.7 (*)    All other components within normal limits  COMPREHENSIVE METABOLIC PANEL - Abnormal; Notable for the following components:   Total Protein 5.2 (*)    Albumin 3.2 (*)    All other components within normal limits  CBC - Abnormal; Notable for the following components:   RBC 3.42 (*)    Hemoglobin 9.9 (*)    HCT 31.8 (*)    All other components within normal limits  BASIC METABOLIC PANEL - Abnormal; Notable for the following components:   Glucose, Bld 305 (*)    Calcium 7.9 (*)    GFR calc non Af Amer 56 (*)    All other components within normal limits  PROTIME-INR  TYPE AND SCREEN  ABO/RH   ____________________________________________  RADIOLOGY  [place]as [place] Lower Extremity Arterial Duplex  Result Date: 11/04/2018 LOWER EXTREMITY ARTERIAL DUPLEX STUDY Indications:  Pain in right leg following status post right femoral              endarterectomy.               History of known left common femoral high grade stenosis. High Risk Factors: Hypertension, hyperlipidemia, past history of smoking.  Vascular Interventions: 04/12/18 - Left femoral- below the knee popliteal bypass                         graft.                         04/09/18 - Right common femoral endarterectomy with bovine                         patch angioplasty. Current ABI:            N/A Performing Technologist: Oda Cogan RDMS, RVT  Examination Guidelines: A complete evaluation includes B-mode imaging, spectral Doppler, color Doppler, and power Doppler as needed of all accessible portions of each vessel. Bilateral testing is considered an integral part of a complete examination. Limited examinations for reoccurring indications may be performed as noted.  Right Graft #1: +------------------+--------+--------+----------+---------------------+                   PSV cm/sStenosisWaveform  Comments              +------------------+--------+--------+----------+---------------------+ Inflow            0       occluded          prox and mid segments +------------------+--------+--------+----------+---------------------+ Prox Anastomosis  20              monophasic                      +------------------+--------+--------+----------+---------------------+ Proximal Graft                    monophasic                      +------------------+--------+--------+----------+---------------------+ Mid Graft         25              monophasic                      +------------------+--------+--------+----------+---------------------+ Distal Graft      36  monophasic                      +------------------+--------+--------+----------+---------------------+ Distal Anastomosis29              monophasic                       +------------------+--------+--------+----------+---------------------+ Outflow           32              monophasic                      +------------------+--------+--------+----------+---------------------+  Left Graft #1: +--------------------+--------+---------------+----------+--------------+                     PSV cm/sStenosis       Waveform  Comments       +--------------------+--------+---------------+----------+--------------+ Inflow              633     75-99% stenosismonophasicknown stenosis +--------------------+--------+---------------+----------+--------------+ Proximal Anastomosis156                    monophasic               +--------------------+--------+---------------+----------+--------------+ Proximal Graft                                                      +--------------------+--------+---------------+----------+--------------+ Mid Graft                                                           +--------------------+--------+---------------+----------+--------------+ Distal Graft                                                        +--------------------+--------+---------------+----------+--------------+ Distal Anastamosis                                                  +--------------------+--------+---------------+----------+--------------+ Outflow                                                             +--------------------+--------+---------------+----------+--------------+  Summary: Right: The right common femoral artery is thrombosed. The femoral to tibio-peroneal trunk graft is patent with monophasic flow noted throughout the graft. Left: Left common femoral artery is patent with a 75-99% stenosis.  See table(s) above for measurements and observations. Electronically signed by Harold Barban MD on 11/04/2018 at 3:05:58 PM.    Final     ____________________________________________   PROCEDURES  Procedure(s)  performed:   Procedures  CRITICAL CARE Performed by: Merrily Pew Total critical care time: 35 minutes Critical care time was exclusive of separately billable procedures and treating  other patients. Critical care was necessary to treat or prevent imminent or life-threatening deterioration. Critical care was time spent personally by me on the following activities: development of treatment plan with patient and/or surrogate as well as nursing, discussions with consultants, evaluation of patient's response to treatment, examination of patient, obtaining history from patient or surrogate, ordering and performing treatments and interventions, ordering and review of laboratory studies, ordering and review of radiographic studies, pulse oximetry and re-evaluation of patient's condition.  ____________________________________________   INITIAL IMPRESSION / ASSESSMENT AND PLAN / ED COURSE  Suspect she has an acute thrombotic or worsening dissection of her right femoral artery as a consultation from the procedure yesterday.  Does not make sense that she also does not have dopplerable pulses on the left some if she has a more proximal injury however her left foot is not nearly as cold or discolored as her right foot.  Discussed with vascular surgery they recommend an ultrasound and they will come and see.  Pertinent labs & imaging results that were available during my care of the patient were reviewed by me and considered in my medical decision making (see chart for details).  ____________________________________________  FINAL CLINICAL IMPRESSION(S) / ED DIAGNOSES  Final diagnoses:  Arthralgia of right lower leg     MEDICATIONS GIVEN DURING THIS VISIT:  Medications  multivitamin with minerals tablet 1 tablet (1 tablet Oral Given 11/05/18 0031)  amLODipine (NORVASC) tablet 5 mg (5 mg Oral Given 11/05/18 0031)  albuterol (PROVENTIL) (2.5 MG/3ML) 0.083% nebulizer solution 2.5 mg (has no  administration in time range)  clopidogrel (PLAVIX) tablet 75 mg (75 mg Oral Given 11/05/18 0031)  atorvastatin (LIPITOR) tablet 10 mg (has no administration in time range)  gabapentin (NEURONTIN) capsule 100 mg (100 mg Oral Given 11/05/18 0032)  umeclidinium bromide (INCRUSE ELLIPTA) 62.5 MCG/INH 1 puff (1 puff Inhalation Given 11/05/18 0923)  aspirin EC tablet 81 mg (has no administration in time range)  dextromethorphan-guaiFENesin (MUCINEX DM) 30-600 MG per 12 hr tablet 1 tablet (1 tablet Oral Not Given 11/04/18 2254)  dextromethorphan (DELSYM) 30 MG/5ML liquid 30 mg (has no administration in time range)  famotidine (PEPCID) tablet 20 mg (20 mg Oral Given 11/05/18 0032)  mometasone-formoterol (DULERA) 200-5 MCG/ACT inhaler 2 puff (2 puffs Inhalation Given 11/05/18 0922)  loratadine (CLARITIN) tablet 10 mg (10 mg Oral Not Given 11/04/18 2252)  0.9 %  sodium chloride infusion (has no administration in time range)  magnesium sulfate IVPB 2 g 50 mL (has no administration in time range)  potassium chloride SA (K-DUR,KLOR-CON) CR tablet 20-40 mEq (has no administration in time range)  docusate sodium (COLACE) capsule 100 mg (has no administration in time range)  ondansetron (ZOFRAN) injection 4 mg (has no administration in time range)  alum & mag hydroxide-simeth (MAALOX/MYLANTA) 200-200-20 MG/5ML suspension 15-30 mL (has no administration in time range)  pantoprazole (PROTONIX) EC tablet 40 mg (40 mg Oral Given 11/04/18 2154)  labetalol (NORMODYNE,TRANDATE) injection 10 mg (has no administration in time range)  hydrALAZINE (APRESOLINE) injection 5 mg (has no administration in time range)  metoprolol tartrate (LOPRESSOR) injection 2-5 mg (has no administration in time range)  guaiFENesin-dextromethorphan (ROBITUSSIN DM) 100-10 MG/5ML syrup 15 mL (has no administration in time range)  phenol (CHLORASEPTIC) mouth spray 1 spray (has no administration in time range)  enoxaparin (LOVENOX) injection  30 mg (30 mg Subcutaneous Given 11/05/18 0820)  0.9 %  sodium chloride infusion ( Intravenous New Bag/Given 11/04/18 2037)  traMADol (ULTRAM) tablet 50 mg (  50 mg Oral Given 11/05/18 0506)  senna-docusate (Senokot-S) tablet 1 tablet (has no administration in time range)  bisacodyl (DULCOLAX) EC tablet 5 mg (has no administration in time range)  HYDROmorphone (DILAUDID) injection 0.25-0.5 mg (has no administration in time range)  fentaNYL (SUBLIMAZE) 100 MCG/2ML injection (has no administration in time range)  predniSONE (DELTASONE) tablet 20 mg (20 mg Oral Given 11/05/18 0819)  ceFAZolin (ANCEF) IVPB 2g/100 mL premix (2 g Intravenous New Bag/Given 11/05/18 0501)     NEW OUTPATIENT MEDICATIONS STARTED DURING THIS VISIT:  Current Discharge Medication List    START taking these medications   Details  traMADol (ULTRAM) 50 MG tablet Take 1 tablet (50 mg total) by mouth every 6 (six) hours as needed for moderate pain. Qty: 20 tablet, Refills: 0        Note:  This note was prepared with assistance of Dragon voice recognition software. Occasional wrong-word or sound-a-like substitutions may have occurred due to the inherent limitations of voice recognition software.   Harrison Paulson, Corene Cornea, MD 11/05/18 0930

## 2018-11-04 NOTE — Telephone Encounter (Signed)
sch appt lvm [facility] ltr 11/23/18 11am LE bypass 12pm ABI 1215 pm f/u MD

## 2018-11-04 NOTE — Telephone Encounter (Signed)
-----   Message from Mena Goes, RN sent at 11/03/2018 10:47 AM EST ----- Regarding: 3-4 weeks with NP and 2 labs   ----- Message ----- From: Serafina Mitchell, MD Sent: 11/03/2018   8:52 AM EST To: [facility] Charge Pool  11/03/2018:  Surgeon:  [name] Procedure Performed:  1.  Ultrasound-guided access, left femoral artery  2.  Abdominal aortogram  3.  Bilateral lower extremity runoff  4.  Angioplasty, right common femoral artery  5.  Conscious sedation (60 minutes)  Have patient follow-up with Vinnie Level in 3-4 weeks with ABIs and bilateral lower extremity duplex.

## 2018-11-04 NOTE — Anesthesia Preprocedure Evaluation (Addendum)
Anesthesia Evaluation  Patient identified by MRN, date of birth, ID band Patient awake    Reviewed: Allergy & Precautions, H&P , NPO status , Patient's Chart, lab work & pertinent test results  Airway Mallampati: III  TM Distance: >3 FB Neck ROM: Full    Dental no notable dental hx. (+) Edentulous Upper, Edentulous Lower, Dental Advisory Given   Pulmonary COPD,  COPD inhaler, former smoker,    Pulmonary exam normal breath sounds clear to auscultation       Cardiovascular hypertension, Pt. on medications + Peripheral Vascular Disease   Rhythm:Regular Rate:Normal     Neuro/Psych negative neurological ROS  negative psych ROS   GI/Hepatic negative GI ROS, Neg liver ROS,   Endo/Other  negative endocrine ROS  Renal/GU negative Renal ROS  negative genitourinary   Musculoskeletal  (+) Arthritis , Osteoarthritis,    Abdominal   Peds  Hematology negative hematology ROS (+)   Anesthesia Other Findings   Reproductive/Obstetrics negative OB ROS                            Anesthesia Physical Anesthesia Plan  ASA: III and emergent  Anesthesia Plan: General   Post-op Pain Management:    Induction: Intravenous, Rapid sequence and Cricoid pressure planned  PONV Risk Score and Plan: 4 or greater and Ondansetron, Dexamethasone and Treatment may vary due to age or medical condition  Airway Management Planned: Oral ETT  Additional Equipment:   Intra-op Plan:   Post-operative Plan: Extubation in OR  Informed Consent: I have reviewed the patients History and Physical, chart, labs and discussed the procedure including the risks, benefits and alternatives for the proposed anesthesia with the patient or authorized representative who has indicated his/her understanding and acceptance.   Dental advisory given  Plan Discussed with: CRNA  Anesthesia Plan Comments:        Anesthesia Quick  Evaluation

## 2018-11-04 NOTE — ED Notes (Signed)
No pulse noted with doppler

## 2018-11-04 NOTE — ED Notes (Signed)
Help get patient undress on the monitor patient is resting with call bell in reach and family at bedside 

## 2018-11-04 NOTE — Op Note (Signed)
Patient name: Carla Dawson MRN: [id number] DOB: [birth year] Sex: female  11/04/2018 Pre-operative Diagnosis: Ischemic right leg Post-operative diagnosis:  Same Surgeon:  [name] Assistants: Laurence Slate Procedure:   #1: Redo right femoral artery exposure   #2: Right iliac artery thrombectomy   #3: Right profunda and femoral tibial bypass graft thrombectomy   #4: Right external iliac and common femoral endarterectomy with bovine pericardial patch angioplasty   #5: Placement of Praveena incisional wound VAC Anesthesia: General Blood Loss: 150 cc Specimens: None  Findings: Dissection was identified in the distal external iliac artery.  I performed a endarterectomy of the common femoral artery extending into the proximal portion of the bypass graft as well as proximal profundofemoral artery and extending up into the proximal external iliac artery.  Once I was able to remove the dissection flap which was associated with significant neointima, there was excellent inflow.  A bovine pericardial patch was placed on the common femoral artery extending down onto the bypass graft for approximately 2 cm.  Indications: The patient has extensive vascular surgery history having undergone bilateral lower extremity bypass with revisions.  She has been having right leg claudication and underwent angiography yesterday.  A external iliac and common femoral artery stenosis was identified and treated with balloon angioplasty.  There was a non-flow-limiting dissection at the end of the case that did not resolve with prolonged balloon inflation.  I did not feel that this would compromise her bypass graft because it was flow-limiting.  She was sent home.  She did well overnight but developed worsening pain this morning and came to the ER.  She did not have Doppler signals in her foot.  She really did not have any neurovascular compromise.  Ultrasound revealed an occluded common femoral artery.  She comes  in today for surgical repair.  Procedure:  The patient was identified in the holding area and taken to [place] [facility]  The patient was then placed supine on the table. general anesthesia was administered.  The patient was prepped and draped in the usual sterile fashion.  A time out was called and antibiotics were administered.  The patient's previous longitudinal incision right groin incision was opened with a 10 blade.  Cautery was used to divide subcutaneous tissue.  There was extensive scarring in the right groin.  With sharp dissection I exposed the common femoral artery above the circumflex branches.  I isolated a single profunda branch as well as the bypass graft.  Once a had everything exposed the patient was fully heparinized.  After vessel occlusion, a #11 blade was used to make a arteriotomy in the previous bovine pericardial patch.  This was opened longitudinally with Potts scissors up to the previous patch and then down onto the bypass graft for about 2 cm.  There was acute thrombus within the artery.  The patient had extensive neointima.  I performed an endarterectomy of the common femoral artery down to the bypass graft as well as into the proximal profundofemoral I then passed a #3 Fogarty which went all the way down to the ankle.  No significant thrombus was evacuated from the bypass graft.  Also passed the Fogarty down into the profunda femoral artery without significant thrombus evacuation.  A #4 Fogarty catheter was then advanced into the aorta.  It passed easily.  Acute thrombus was evacuated.  I then proceeded with endarterectomy up into the distal external iliac artery.  I still was not satisfied with the inflow  and so the balloon on the Foley catheter was used for proximal control.  I used hemostats to remove more of the neointima and ultimately was able to remove the dissection flap which came out with a long 6 cm piece of the distal external iliac artery.  After this was done, there was  excellent inflow.  At this point, I made sure all potential embolic debris was removed and that there were no flaps.  The endarterectomized plane was very smooth.  I selected a new bovine pericardial patch and perform patch angioplasty using the entire 6 cm patch.  This was done with 6-0 Prolene.  Prior to completion, the Fogarty catheter was then passed again down the profunda and the bypass graft as well as up into the aorta with no thrombus evacuated.  The arteriotomy was then closed.  The clamps were released.  2 repair stitches were required for hemostasis.  At this point the patient had a peroneal Doppler signal as well as a palpable pulse within the common femoral arte and brisk signals in the bypass graft and proximal profundofemoral artery.  50 mg of protamine was then administered.  Once hemostasis was satisfactory, the incision was closed with multiple layers of 2-0 and 3-0 Vicryl followed by 3-0 Vicryl on the skin.  A Praveena wound VAC was placed over the incision.  There were no immediate complications.   Disposition: To PACU stable   V. [name], M.D. [facility] Office: [phone] Pager:  [phone]

## 2018-11-04 NOTE — Progress Notes (Signed)
Preliminary results by tech - Right lower arterial duplex completed. Right common femoral artery is thrombosed with patency noted throughout the femoral to tibioperoneal trunk bypass graft. Results given to Dr. Alfonse Alpers Vivan Agostino, BS, RDMS, RVT

## 2018-11-04 NOTE — Anesthesia Procedure Notes (Signed)
Procedure Name: Intubation Date/Time: 11/04/2018 2:55 PM Performed by: Colin Benton, CRNA Pre-anesthesia Checklist: Patient identified, Emergency Drugs available, Suction available and Patient being monitored Patient Re-evaluated:Patient Re-evaluated prior to induction Oxygen Delivery Method: Circle system utilized Preoxygenation: Pre-oxygenation with 100% oxygen Induction Type: IV induction, Rapid sequence and Cricoid Pressure applied Laryngoscope Size: Miller and 2 Grade View: Grade I Tube type: Oral Tube size: 7.0 mm Number of attempts: 1 Airway Equipment and Method: Stylet Placement Confirmation: ETT inserted through vocal cords under direct vision,  positive ETCO2 and breath sounds checked- equal and bilateral Secured at: 19 (At teeth) cm Tube secured with: Tape Dental Injury: Teeth and Oropharynx as per pre-operative assessment

## 2018-11-04 NOTE — Anesthesia Postprocedure Evaluation (Signed)
Anesthesia Post Note  Patient: Carla Dawson  Procedure(s) Performed: ENDARTERECTOMY ILIO-FEMORAL ENDARTECTOMY (Right Groin) PATCH ANGIOPLASTY WITH XENOSURE PATCH (Right Groin) THROMBECTOMY RIGHT ILIO AND FEMORAL-TIBIAL BYPASS (Right Groin)     Patient location during evaluation: PACU Anesthesia Type: General Level of consciousness: awake Pain management: pain level controlled Vital Signs Assessment: post-procedure vital signs reviewed and stable Respiratory status: spontaneous breathing Cardiovascular status: stable Anesthetic complications: no    Last Vitals:  Vitals:   11/04/18 1922 11/04/18 1937  BP: (!) 137/41 (!) 136/57  Pulse: 69 72  Resp: 20 16  Temp:  36.6 C  SpO2: 100% 100%    Last Pain:  Vitals:   11/04/18 1837  TempSrc:   PainSc: 7         RLE Motor Response: Purposeful movement (11/04/18 1937) RLE Sensation: Numbness(normal for patient) (11/04/18 1937)      [place]

## 2018-11-04 NOTE — H&P (Signed)
[facility] [place]  Patient name: Carla Dawson MRN: [id number] DOB: 1931/02/25 Sex: female   REQUESTING PROVIDER:    ER   REASON FOR CONSULT:    Right leg pain  HISTORY OF PRESENT ILLNESS:   Carla Dawson is a 84 y.o. female, who presented to the emergency department earlier today with complaints of right leg pain.  The patient underwent angiography yesterday for right leg pain.  She was found to have a patent right femoral distal bypass graft however there was a high-grade stenosis within the right common femoral artery.  This was treated with balloon angioplasty and subsequent dissection.  The dissection was treated with prolonged balloon inflation.  There was a residual dissection at the end of the case however it appeared to be flow-limiting.  Ultrasound today shows occlusion of the common femoral artery but the bypass graft remains patent.  The patient is status post left femoral endarterectomy and femoral to below-knee popliteal bypass graft with Gore-Tex on 04/12/2018 by Dr. Oneida Alar.  This was done emergently secondary to acute occlusion of her left leg stents.  The patient is also undergone right femoral endarterectomy with patch angioplasty on 04/09/2018 by Dr. Donzetta Matters for acute right lower extremity ischemia.  Previously, in another state, she was dealing with bilateral rest pain. She underwent percutaneous revascularization of her right leg which was complicated by acute occlusion as well as perforation which required cover stenting. This ultimately led to surgical revascularization which appears to be a right femoral to tibioperoneal trunk bypass graft. On the left leg, the patient has undergone multiple percutaneous interventions including atherectomy and angioplasty of the superficial femoral and popliteal artery as well as angioplasty of anterior tibial artery.   Past medical history:  COPD managed with  inhalers, Plavix daily s/p stent placement, and HTN managed with Norvasc.  She states she has no history of CAD or DM.     PAST MEDICAL HISTORY    Past Medical History:  Diagnosis Date  . Allergy    "maybe seasonal allergies" (07/08/2017)  . Arthritis    "hands" (07/08/2017)  . Chicken pox   . COPD (chronic obstructive pulmonary disease) ([place])   . High cholesterol    "took me off pills ~ 1-2 months ago" (07/08/2017)  . History of blood transfusion 2017   "when I had blood clot in my leg"  . Hypertension   . PAD (peripheral artery disease) ([place])    severe/notes 10/14/2016  . Pneumonia ~ 2015  . PVD (peripheral vascular disease) ([place])      FAMILY HISTORY   Family History  Problem Relation Age of Onset  . Arthritis Mother   . Hypertension Mother   . Arthritis Father   . Hypertension Father   . Cancer Brother   . Hypertension Son     SOCIAL HISTORY:   Social History   Socioeconomic History  . Marital status: Divorced    Spouse name: Not on file  . Number of children: Not on file  . Years of education: Not on file  . Highest education level: Not on file  Occupational History  . Not on file  Social Needs  . Financial resource strain: Not on file  . Food insecurity:    Worry: Not on file    Inability: Not on file  . Transportation needs:    Medical: Not on file    Non-medical: Not on file  Tobacco Use  . Smoking status: Former Smoker  Packs/day: 1.50    Years: 52.00    Pack years: 78.00    Start date: 12/09/1953    Last attempt to quit: 12/09/2005    Years since quitting: 12.9  . Smokeless tobacco: Never Used  Substance and Sexual Activity  . Alcohol use: No  . Drug use: No  . Sexual activity: Not Currently  Lifestyle  . Physical activity:    Days per week: Not on file    Minutes per session: Not on file  . Stress: Not on file  Relationships  . Social connections:    Talks on phone: Not on file    Gets together: Not on file    Attends religious  service: Not on file    Active member of club or organization: Not on file    Attends meetings of clubs or organizations: Not on file    Relationship status: Not on file  . Intimate partner violence:    Fear of current or ex partner: Not on file    Emotionally abused: Not on file    Physically abused: Not on file    Forced sexual activity: Not on file  Other Topics Concern  . Not on file  Social History Narrative  . Not on file    ALLERGIES:    Allergies  Allergen Reactions  . Hydrocodone-Acetaminophen Itching and Shortness Of Breath  . Lortab [Hydrocodone-Acetaminophen] Shortness Of Breath and Itching  . Morphine And Related Other (See Comments)    Unknown per pt.    CURRENT MEDICATIONS:    Current Facility-Administered Medications  Medication Dose Route Frequency Provider Last Rate Last Dose  . 0.9 % irrigation (POUR BTL)    PRN Serafina Mitchell, MD   2,000 mL at 11/04/18 1429  . [MAR Hold] fentaNYL (SUBLIMAZE) injection 50 mcg  50 mcg Intravenous Once Mesner, Corene Cornea, MD   Stopped at 11/04/18 1341  . heparin 6,000 Units in sodium chloride 0.9 % 500 mL irrigation    PRN Serafina Mitchell, MD   500 mL at 11/04/18 1430    REVIEW OF SYSTEMS:   [X]  denotes positive finding, [ ]  denotes negative finding Cardiac  Comments:  Chest pain or chest pressure:    Shortness of breath upon exertion:    Short of breath when lying flat:    Irregular heart rhythm: x       Vascular    Pain in calf, thigh, or hip brought on by ambulation: x   Pain in feet at night that wakes you up from your sleep:  x   Blood clot in your veins:    Leg swelling:  x       Pulmonary    Oxygen at home:    Productive cough:     Wheezing:         Neurologic    Sudden weakness in arms or legs:     Sudden numbness in arms or legs:     Sudden onset of difficulty speaking or slurred speech:    Temporary loss of vision in one eye:     Problems with dizziness:         Gastrointestinal    Blood in  stool:      Vomited blood:         Genitourinary    Burning when urinating:     Blood in urine:        Psychiatric    Major depression:         Hematologic  Bleeding problems:    Problems with blood clotting too easily:        Skin    Rashes or ulcers:        Constitutional    Fever or chills:     PHYSICAL EXAM:   Vitals:   11/04/18 1115 11/04/18 1200 11/04/18 1245 11/04/18 1417  BP: (!) 135/42 (!) 119/94 (!) 137/52   Pulse: (!) 32  63   Resp: 20 17 20    Temp:      TempSrc:      SpO2: 95%  96%   Weight:    50 kg  Height:    5\' 1"  (1.549 m)    GENERAL: The patient is a well-nourished female, in no acute distress. The vital signs are documented above. CARDIAC: There is a regular rate and rhythm.  VASCULAR: Nonpalpable pedal pulses bilaterally PULMONARY: Nonlabored respirations ABDOMEN: Soft and non-tender with normal pitched bowel sounds.  MUSCULOSKELETAL: There are no major deformities or cyanosis. NEUROLOGIC: No focal weakness or paresthesias are detected. SKIN: There are no ulcers or rashes noted. PSYCHIATRIC: The patient has a normal affect.  STUDIES:   I was present for ultrasound examination which shows occlusion of the right common femoral artery.  Her femoral tibial bypass graft remains patent.  She also has a high-grade stenosis in the left common femoral artery  ASSESSMENT and PLAN   Acute occlusion of the right common femoral artery: This was secondary to dissection following balloon angioplasty yesterday.  Fortunately her bypass graft remains widely patent.  She needs to go urgently to the operating room for repair.  There is been as the operation discussed with patient and her daughter.  All her questions were answered.   [name], MD [facility] [phone] Pager [phone]

## 2018-11-05 LAB — BASIC METABOLIC PANEL
ANION GAP: 5 (ref 5–15)
BUN: 12 mg/dL (ref 8–23)
CALCIUM: 7.9 mg/dL — AB (ref 8.9–10.3)
CO2: 25 mmol/L (ref 22–32)
Chloride: 106 mmol/L (ref 98–111)
Creatinine, Ser: 0.93 mg/dL (ref 0.44–1.00)
GFR calc Af Amer: >60 mL/min (ref >60)
GFR, EST NON AFRICAN AMERICAN: 56 mL/min — AB (ref >60)
GLUCOSE: 305 mg/dL — AB (ref 70–99)
POTASSIUM: 4.4 mmol/L (ref 3.5–5.1)
Sodium: 136 mmol/L (ref 135–145)

## 2018-11-05 LAB — CBC
HEMATOCRIT: 31.8 % — AB (ref 36.0–46.0)
Hemoglobin: 9.9 g/dL — ABNORMAL LOW (ref 12.0–15.0)
MCH: 28.9 pg (ref 26.0–34.0)
MCHC: 31.1 g/dL (ref 30.0–36.0)
MCV: 93 fL (ref 80.0–100.0)
NRBC: 0 % (ref 0.0–0.2)
PLATELETS: 172 10*3/uL (ref 150–400)
RBC: 3.42 MIL/uL — ABNORMAL LOW (ref 3.87–5.11)
RDW: 13.1 % (ref 11.5–15.5)
WBC: 10.5 10*3/uL (ref 4.0–10.5)

## 2018-11-05 MED ORDER — TRAMADOL HCL 50 MG PO TABS: 50.0000 mg | ORAL_TABLET | Freq: Four times a day (QID) | ORAL | 0 refills | Status: DC | PRN

## 2018-11-05 NOTE — Progress Notes (Signed)
Carla Dawson to be D/C'd Home per MD order. Discussed with the patient and all questions fully answered.    IV catheter discontinued intact. Site without signs and symptoms of complications. Dressing and pressure applied.  An After Visit Summary was printed and given to the patient.  Patient escorted via [place], and D/C home via private auto.  [name]

## 2018-11-05 NOTE — Evaluation (Signed)
Occupational Therapy Evaluation Patient Details Name: Carla Dawson MRN: [id number] DOB: 05/24/1931 Today's Date: 11/05/2018    History of Present Illness 84 y.o. female admitted on 11/04/18 for PVD with claudication.  S/p R iliac thrombectomy, R profunda and femoral tibial bypass graft thrombectomy, R external iliac and common femoral endarterectomy with bovine pericaridal patch angioplasty and wound vac placement.  Pt with significant PMH of PVD, PAD, HTN, COPD, multile LE bypasses and antiographies.     Clinical Impression   Patient evaluated by Occupational Therapy with no further acute OT needs identified. All education has been completed and the patient has no further questions. Pt requires min A for LB ADLs due to groin pain.  Family is supportive and able to assist as needed.  See below for any follow-up Occupational Therapy or equipment needs. OT is signing off. Thank you for this referral.      Follow Up Recommendations  No OT follow up;Supervision/Assistance - 24 hour    Equipment Recommendations  None recommended by OT    Recommendations for Other Services       Precautions / Restrictions Precautions Precautions: Other (comment) Precaution Comments: monitor O2 sats Restrictions Weight Bearing Restrictions: No      Mobility Bed Mobility               General bed mobility comments: Pt sitting up in recliner   Transfers Overall transfer level: Needs assistance Equipment used: Rolling walker (2 wheeled);None Transfers: Sit to/from Stand Sit to Stand: Min guard         General transfer comment: min guard assist for safety     Balance Overall balance assessment: Needs assistance Sitting-balance support: Feet supported;No upper extremity supported Sitting balance-Leahy Scale: Good     Standing balance support: No upper extremity supported Standing balance-Leahy Scale: Fair Standing balance comment: close supervision without UE support.                             ADL either performed or assessed with clinical judgement   ADL Overall ADL's : Needs assistance/impaired Eating/Feeding: Independent   Grooming: Wash/dry hands;Wash/dry face;Oral care;Brushing hair;Supervision/safety;Standing   Upper Body Bathing: Set up;Sitting   Lower Body Bathing: Minimal assistance;Sit to/from stand Lower Body Bathing Details (indicate cue type and reason): assist for Rt foot due to groin pain  Upper Body Dressing : Set up;Sitting   Lower Body Dressing: Minimal assistance;Sit to/from stand Lower Body Dressing Details (indicate cue type and reason): assist for Rt LE secondary to groin pain  Toilet Transfer: Min guard;Ambulation;Comfort height toilet;Grab bars   Toileting- Clothing Manipulation and Hygiene: Min guard;Sit to/from stand       Functional mobility during ADLs: Min guard       Vision         Perception     Praxis      Pertinent Vitals/Pain Pain Assessment: Faces Faces Pain Scale: Hurts little more Pain Location: R groin Pain Descriptors / Indicators: Grimacing;Guarding Pain Intervention(s): Monitored during session     Hand Dominance Right   Extremity/Trunk Assessment Upper Extremity Assessment Upper Extremity Assessment: Overall WFL for tasks assessed   Lower Extremity Assessment Lower Extremity Assessment: Defer to PT evaluation       Communication Communication Communication: HOH   Cognition Arousal/Alertness: Awake/alert Behavior During Therapy: WFL for tasks assessed/performed Overall Cognitive Status: Within Functional Limits for tasks assessed  General Comments  02 sats 86% on RA.  Increased to 92% withinn 1-2 mins with pursed lip breathing. Instructed her to wear 02 at home during activity     Exercises     Shoulder Instructions      Home Living Family/patient expects to be discharged to:: Private residence Living  Arrangements: Children(daughter) Available Help at Discharge: Family;Available 24 hours/day(daughter is retired, son in law is there too) Type of Home: House Home Access: Stairs to enter Technical brewer of Steps: 3-4 Entrance Stairs-Rails: Right Home Layout: One level     Bathroom Shower/Tub: Occupational psychologist: Standard Bathroom Accessibility: No   Home Equipment: Environmental consultant - 4 wheels;Shower seat;Hand held shower head;Wheelchair - manual   Additional Comments: Pt has a finger pulse ox at home      Prior Functioning/Environment Level of Independence: Independent                 OT Problem List: Decreased activity tolerance;Impaired balance (sitting and/or standing);Pain;Cardiopulmonary status limiting activity      OT Treatment/Interventions:      OT Goals(Current goals can be found in the care plan section) Acute Rehab OT Goals Patient Stated Goal: to go home and get stronger  OT Goal Formulation: All assessment and education complete, DC therapy  OT Frequency:     Barriers to D/C:            Co-evaluation              AM-PAC OT "6 Clicks" Daily Activity     Outcome Measure Help from another person eating meals?: None Help from another person taking care of personal grooming?: A Little Help from another person toileting, which includes using toliet, bedpan, or urinal?: A Little Help from another person bathing (including washing, rinsing, drying)?: A Little Help from another person to put on and taking off regular upper body clothing?: None Help from another person to put on and taking off regular lower body clothing?: A Little 6 Click Score: 20   End of Session Equipment Utilized During Treatment: Gait belt Nurse Communication: Mobility status  Activity Tolerance: Patient tolerated treatment well Patient left: in chair;with call bell/phone within reach  OT Visit Diagnosis: Unsteadiness on feet (R26.81)                Time:  1240-1300 OT Time Calculation (min): 20 min Charges:  OT General Charges $OT Visit: 1 Visit OT Evaluation $OT Eval Moderate Complexity: 1 Mod  Lucille Passy, OTR/L Acute Rehabilitation Services Pager [phone] Office [phone]   Lucille Passy M 11/05/2018, 3:09 PM

## 2018-11-05 NOTE — Care Management Note (Signed)
Case Management Note Marvetta Gibbons RN, BSN Transitions of Care Unit 4E- RN Case Manager [phone]  Patient Details  Name: Carla Dawson MRN: [id number] Date of Birth: [birth year]  Subjective/Objective:  Pt admitted s/p redo bypass and thrombectomy,                   Action/Plan: PTA pt lived at home, order placed for home 02- call made to Butch Penny with [facility] for DME- home 02 needs- portable tank to be delivered to room prior to discharge. Pt will go home with prevena wound vac  Expected Discharge Date:  11/05/18               Expected Discharge Plan:  Home/Self Care  In-House Referral:  NA  Discharge planning Services  CM Consult  Post Acute Care Choice:  Durable Medical Equipment Choice offered to:  Patient  DME Arranged:  Oxygen DME Agency:  [place]:  NA [place] Agency:  NA  Status of Service:  Completed, signed off  If discussed at [place] of Stay Meetings, dates discussed:    Discharge Disposition: home/self care   Additional Comments:  [name], RN 11/05/2018, 9:59 AM

## 2018-11-05 NOTE — Progress Notes (Addendum)
  Progress Note    11/05/2018 7:37 AM 1 Day Post-Op  Subjective:  No complaints  Afebrile HR 70's-90's NSR 151'V-616'W systolic 737% [id number]  Vitals:   11/05/18 0400 11/05/18 0427  BP: (!) 125/55   Pulse:    Resp: 17 19  Temp:  98.3 F (36.8 C)  SpO2: 100% 100%    Physical Exam: Cardiac:  Regular Lungs:  Non labored Incisions:  Right groin with Pravena vac in place Extremities:  Bilateral feet warm with right monophasic AT/peroneal doppler signals and left monophasic AT/PT doppler signals.   CBC    Component Value Date/Time   WBC 10.5 11/05/2018 0009   RBC 3.42 (L) 11/05/2018 0009   HGB 9.9 (L) 11/05/2018 0009   HCT 31.8 (L) 11/05/2018 0009   PLT 172 11/05/2018 0009   MCV 93.0 11/05/2018 0009   MCH 28.9 11/05/2018 0009   MCHC 31.1 11/05/2018 0009   RDW 13.1 11/05/2018 0009   LYMPHSABS 1.3 11/04/2018 1159   MONOABS 0.8 11/04/2018 1159   EOSABS 0.2 11/04/2018 1159   BASOSABS 0.0 11/04/2018 1159    BMET    Component Value Date/Time   NA 136 11/05/2018 0009   NA 141 04/23/2013   K 4.4 11/05/2018 0009   CL 106 11/05/2018 0009   CO2 25 11/05/2018 0009   GLUCOSE 305 (H) 11/05/2018 0009   BUN 12 11/05/2018 0009   BUN 13 04/23/2013   CREATININE 0.93 11/05/2018 0009   CREATININE 0.76 06/20/2017 1534   CALCIUM 7.9 (L) 11/05/2018 0009   GFRNONAA 56 (L) 11/05/2018 0009   GFRAA >60 11/05/2018 0009    INR    Component Value Date/Time   INR 1.02 11/04/2018 1159     Intake/Output Summary (Last 24 hours) at 11/05/2018 0737 Last data filed at 11/05/2018 0430 Gross per 24 hour  Intake 1869.87 ml  Output 740 ml  Net 1129.87 ml     Assessment:  84 y.o. female is s/p:  Procedure:   #1: Redo right femoral artery exposure                         #2: Right iliac artery thrombectomy                         #3: Right profunda and femoral tibial bypass graft thrombectomy                         #4: Right external iliac and common femoral endarterectomy with  bovine pericardial patch  1 Day Post-Op  Plan: -pt with + doppler signals right foot -foley was removed about 45 minutes ago -need to mobilize pt out of bed -DVT prophylaxis:  Lovenox -continue plavix/asa   Leontine Locket, PA-C [facility] [phone] 11/05/2018 7:37 AM    I have interviewed and examined patient with PA and agree with assessment and plan above.   Malory Spurr C. Donzetta Matters, MD [facility] [place] Office: [phone] Pager: [phone]

## 2018-11-05 NOTE — Discharge Summary (Signed)
Discharge Summary     Carla Dawson 12/25/1930 84 y.o. female  [id number]  Admission Date: 11/04/2018  Discharge Date: 11/05/18  Physician: Serafina Mitchell, MD  Admission Diagnosis: PAD (peripheral artery disease) ([place]) [I73.9]   HPI:   This is a 84 y.o. female who has extensive vascular surgery history having undergone bilateral lower extremity bypass with revisions.  She has been having right leg claudication and underwent angiography yesterday.  A external iliac and common femoral artery stenosis was identified and treated with balloon angioplasty.  There was a non-flow-limiting dissection at the end of the case that did not resolve with prolonged balloon inflation.  I did not feel that this would compromise her bypass graft because it was flow-limiting.  She was sent home.  She did well overnight but developed worsening pain this morning and came to the ER.  She did not have Doppler signals in her foot.  She really did not have any neurovascular compromise.  Ultrasound revealed an occluded common femoral artery.  She comes in today for surgical repair.  Hospital Course:  The patient was admitted to the hospital and taken to the operating room on 11/04/2018 and underwent: Procedure:   #1: Redo right femoral artery exposure                         #2: Right iliac artery thrombectomy                         #3: Right profunda and femoral tibial bypass graft thrombectomy                         #4: Right external iliac and common femoral endarterectomy with bovine pericardial patch angioplasty                         #5: Placement of Praveena incisional wound VAC    Findings: Dissection was identified in the distal external iliac artery.  I performed a endarterectomy of the common femoral artery extending into the proximal portion of the bypass graft as well as proximal profundofemoral artery and extending up into the proximal external iliac artery.  Once I was able to  remove the dissection flap which was associated with significant neointima, there was excellent inflow.  A bovine pericardial patch was placed on the common femoral artery extending down onto the bypass graft for approximately 2 cm.  The pt tolerated the procedure well and was transported to the PACU in good condition.   By POD 1, pt was doing well with + doppler signals in her right foot.  She mobilized well.  She did have a drop in her oxygen saturation and was evaluated for home oxygen and sent home on 2LO2NC.    The remainder of the hospital course consisted of increasing mobilization and increasing intake of solids without difficulty.  CBC    Component Value Date/Time   WBC 10.5 11/05/2018 0009   RBC 3.42 (L) 11/05/2018 0009   HGB 9.9 (L) 11/05/2018 0009   HCT 31.8 (L) 11/05/2018 0009   PLT 172 11/05/2018 0009   MCV 93.0 11/05/2018 0009   MCH 28.9 11/05/2018 0009   MCHC 31.1 11/05/2018 0009   RDW 13.1 11/05/2018 0009   LYMPHSABS 1.3 11/04/2018 1159   MONOABS 0.8 11/04/2018 1159   EOSABS 0.2 11/04/2018 1159   BASOSABS 0.0 11/04/2018 1159  BMET    Component Value Date/Time   NA 136 11/05/2018 0009   NA 141 04/23/2013   K 4.4 11/05/2018 0009   CL 106 11/05/2018 0009   CO2 25 11/05/2018 0009   GLUCOSE 305 (H) 11/05/2018 0009   BUN 12 11/05/2018 0009   BUN 13 04/23/2013   CREATININE 0.93 11/05/2018 0009   CREATININE 0.76 06/20/2017 1534   CALCIUM 7.9 (L) 11/05/2018 0009   GFRNONAA 56 (L) 11/05/2018 0009   GFRAA >60 11/05/2018 0009       Discharge Diagnosis:  PAD (peripheral artery disease) ([place]) [I73.9]  Secondary Diagnosis: Patient Active Problem List   Diagnosis Date Noted  . Femoral artery occlusion, right ([place]) 11/04/2018  . Senile ecchymosis 07/20/2018  . S/P femoral-popliteal bypass surgery 04/12/2018  . Claudication of right lower extremity ([place]) 04/09/2018  . Ischemia of extremity 04/09/2018  . COPD with acute exacerbation ([place]) 03/23/2018  .  Hyperlipidemia 02/24/2018  . Abnormal CXR 10/26/2016  . COPD  GOLD III  10/07/2016  . Essential hypertension, benign 10/07/2016  . PAD (peripheral artery disease) ([place]) 10/07/2016  . Neuropathy due to peripheral vascular disease ([place]) 10/07/2016   Past Medical History:  Diagnosis Date  . Allergy    "maybe seasonal allergies" (07/08/2017)  . Arthritis    "hands" (07/08/2017)  . Chicken pox   . COPD (chronic obstructive pulmonary disease) ([place])   . High cholesterol    "took me off pills ~ 1-2 months ago" (07/08/2017)  . History of blood transfusion 2017   "when I had blood clot in my leg"  . Hypertension   . PAD (peripheral artery disease) ([place])    severe/notes 10/14/2016  . Pneumonia ~ 2015  . PVD (peripheral vascular disease) ([place])      Allergies as of 11/05/2018      Reactions   Lortab [hydrocodone-acetaminophen] Shortness Of Breath, Itching   Morphine And Related Other (See Comments)   Unknown reaction      Medication List    TAKE these medications   albuterol 108 (90 Base) MCG/ACT inhaler Commonly known as:  PROVENTIL HFA;VENTOLIN HFA INHALE 2 PUFFS BY MOUTH EVERY 4 HOURS AS NEEDED FOR WHEEZING What changed:    how much to take  how to take this  when to take this  reasons to take this  additional instructions   albuterol (2.5 MG/3ML) 0.083% nebulizer solution Commonly known as:  PROVENTIL INHALE 1 NEBULE VIA NEBULIZER EVERY 4 HOURS AS NEEDED FOR WHEEZING OR SHORTNESS OF BREATH What changed:  See the new instructions.   amLODipine 5 MG tablet Commonly known as:  NORVASC Take 1 tablet (5 mg total) by mouth at bedtime.   aspirin EC 81 MG tablet Take 81 mg by mouth daily.   atorvastatin 10 MG tablet Commonly known as:  LIPITOR Take 1 tablet (10 mg total) by mouth daily at 6 PM.   budesonide-formoterol 160-4.5 MCG/ACT inhaler Commonly known as:  SYMBICORT Inhale 2 puffs into the lungs 2 (two) times daily.   cetirizine 10 MG tablet Commonly known as:   ZYRTEC Take 10 mg by mouth at bedtime.   clopidogrel 75 MG tablet Commonly known as:  PLAVIX Take 75 mg by mouth at bedtime.   DELSYM 30 MG/5ML liquid Generic drug:  dextromethorphan Take 30 mg by mouth 2 (two) times daily as needed for cough.   dextromethorphan-guaiFENesin 30-600 MG 12hr tablet Commonly known as:  MUCINEX DM Take 0.5 tablets by mouth 2 (two) times daily.   famotidine  20 MG tablet Commonly known as:  PEPCID Take 20 mg by mouth at bedtime.   gabapentin 100 MG capsule Commonly known as:  NEURONTIN Take 1 capsule (100 mg total) by mouth 3 (three) times daily.   multivitamin with minerals Tabs tablet Take 1 tablet by mouth at bedtime.   omeprazole 20 MG capsule Commonly known as:  PRILOSEC Take 20 mg by mouth daily.   predniSONE 10 MG tablet Commonly known as:  DELTASONE Take 10 mg by mouth See admin instructions. Take one tablet (10 mg) by mouth once daily for 5 days as needed for shortness of breath   SPIRIVA RESPIMAT 2.5 MCG/ACT Aers Generic drug:  Tiotropium Bromide Monohydrate INHALE 2 PUFFS INTO THE LUNGS DAILY What changed:  See the new instructions.   traMADol 50 MG tablet Commonly known as:  ULTRAM Take 1 tablet (50 mg total) by mouth every 6 (six) hours as needed for moderate pain.       Discharge Instructions: [facility] Discharge instructions Lower Extremity Bypass Surgery  Please refer to the following instruction for your post-procedure care. Your surgeon or physician assistant will discuss any changes with you.  Activity  You are encouraged to walk as much as you can. You can slowly return to normal activities during the month after your surgery. Avoid strenuous activity and heavy lifting until your doctor tells you it's OK. Avoid activities such as vacuuming or swinging a golf club. Do not drive until your doctor give the OK and you are no longer taking prescription pain medications. It is also normal  to have difficulty with sleep habits, eating and bowel movement after surgery. These will go away with time.  Bathing/Showering  You may shower after you go home. Do not soak in a bathtub, hot tub, or swim until the incision heals completely.  Incision Care  Clean your incision with mild soap and water. Shower every day. Pat the area dry with a clean towel. You do not need a bandage unless otherwise instructed. Do not apply any ointments or creams to your incision. If you have open wounds you will be instructed how to care for them or a visiting nurse may be arranged for you. If you have staples or sutures along your incision they will be removed at your post-op appointment. You may have skin glue on your incision. Do not peel it off. It will come off on its own in about one week.  Wash the groin wound with soap and water daily and pat dry. (No tub bath-only shower)  Then put a dry gauze or washcloth in the groin to keep this area dry to help prevent wound infection.  Do this daily and as needed.  Do not use Vaseline or neosporin on your incisions.  Only use soap and water on your incisions and then protect and keep dry.  Diet  Resume your normal diet. There are no special food restrictions following this procedure. A low fat/ low cholesterol diet is recommended for all patients with vascular disease. In order to heal from your surgery, it is CRITICAL to get adequate nutrition. Your body requires vitamins, minerals, and protein. Vegetables are the best source of vitamins and minerals. Vegetables also provide the perfect balance of protein. Processed food has little nutritional value, so try to avoid this.  Medications  Resume taking all your medications unless your doctor or Physician Assistant tells you not to. If your incision is causing pain, you may take  over-the-counter pain relievers such as acetaminophen (Tylenol). If you were prescribed a stronger pain medication, please aware these  medication can cause nausea and constipation. Prevent nausea by taking the medication with a snack or meal. Avoid constipation by drinking plenty of fluids and eating foods with high amount of fiber, such as fruits, vegetables, and grains. Take Colace 100 mg (an over-the-counter stool softener) twice a day as needed for constipation.  Do not take Tylenol if you are taking prescription pain medications.  Follow Up  Our office will schedule a follow up appointment 2-3 weeks following discharge.  Please call us immediately for any of the following conditions  .Severe or worsening pain in your legs or feet while at rest or while walking .Increase pain, redness, warmth, or drainage (pus) from your incision site(s) . Fever of 101 degree or higher . The swelling in your leg with the bypass suddenly worsens and becomes more painful than when you were in the hospital . If you have been instructed to feel your graft pulse then you should do so every day. If you can no longer feel this pulse, call the office immediately. Not all patients are given this instruction. .  Leg swelling is common after leg bypass surgery.  The swelling should improve over a few months following surgery. To improve the swelling, you may elevate your legs above the level of your heart while you are sitting or resting. Your surgeon or physician assistant may ask you to apply an ACE wrap or wear compression (TED) stockings to help to reduce swelling.  Reduce your risk of vascular disease  Stop smoking. If you would like help call [facility] at 1-800-QUIT-NOW 813 284 5960) or [place] at [phone].  . Manage your cholesterol . Maintain a desired weight . Control your diabetes weight . Control your diabetes . Keep your blood pressure down .  If you have any questions, please call the office at [phone]   Prescriptions given: 1.  Tramadol 50mg  q6h prn #20 No Refill  Disposition: home  Patient's condition: is  Good  Follow up: 1. Dr. Trula Slade in 2 weeks   Leontine Locket, PA-C [facility] [phone] 11/05/2018  9:29 AM  - For VQI Registry use ---   Post-op:  Wound infection: No  Graft infection: No  Transfusion: No    If yes, n/a units given New Arrhythmia: No Ipsilateral amputation: No, [ ]  Minor, [ ]  BKA, [ ]  AKA Discharge patency: [ ]  Primary, [x ] Primary assisted, [ ]  Secondary, [ ]  Occluded Patency judged by: [x ] Dopper only, [ ]  Palpable graft pulse, []  Palpable distal pulse, [ ]  ABI inc. > 0.15, [ ]  Duplex Discharge ABI: R not done, L not done D/C Ambulatory Status: Ambulatory  Complications: MI: No, [ ]  Troponin only, [ ]  EKG or Clinical CHF: No Resp failure:No, [ ]  Pneumonia, [ ]  Ventilator Chg in renal function: No, [ ]  Inc. Cr > 0.5, [ ]  Temp. Dialysis,  [ ]  Permanent dialysis Stroke: No, [ ]  Minor, [ ]  Major Return to OR: No  Reason for return to OR: [ ]  Bleeding, [ ]  Infection, [ ]  Thrombosis, [ ]  Revision  Discharge medications: Statin use:  yes ASA use:  yes Plavix use:  yes Beta blocker use: no CCB use:  Yes ACEI use:   no ARB use:  no Coumadin use: no

## 2018-11-05 NOTE — Discharge Instructions (Signed)
[facility]  Discharge instructions  Lower Extremity Bypass Surgery  Please refer to the following instruction for your post-procedure care. Your surgeon or physician assistant will discuss any changes with you.  Activity  You are encouraged to walk as much as you can. You can slowly return to normal activities during the month after your surgery. Avoid strenuous activity and heavy lifting until your doctor tells you it's OK. Avoid activities such as vacuuming or swinging a golf club. Do not drive until your doctor give the OK and you are no longer taking prescription pain medications. It is also normal to have difficulty with sleep habits, eating and bowel movement after surgery. These will go away with time.  Bathing/Showering  Shower daily after you go home. Do not soak in a bathtub, hot tub, or swim until the incision heals completely.  Incision Care  Clean your incision with mild soap and water. Shower every day. Pat the area dry with a clean towel. You do not need a bandage unless otherwise instructed. Do not apply any ointments or creams to your incision. If you have open wounds you will be instructed how to care for them or a visiting nurse may be arranged for you. If you have staples or sutures along your incision they will be removed at your post-op appointment. You may have skin glue on your incision. Do not peel it off. It will come off on its own in about one week.  The wound vac will stay on for a week, once it stops working, remove and follow the following instructions:  Wash the groin wound with soap and water daily and pat dry. (No tub bath-only shower)  Then put a dry gauze or washcloth in the groin to keep this area dry to help prevent wound infection.  Do this daily and as needed.  Do not use Vaseline or neosporin on your incisions.  Only use soap and water on your incisions and then protect and keep dry.  Diet  Resume your normal  diet. There are no special food restrictions following this procedure. A low fat/ low cholesterol diet is recommended for all patients with vascular disease. In order to heal from your surgery, it is CRITICAL to get adequate nutrition. Your body requires vitamins, minerals, and protein. Vegetables are the best source of vitamins and minerals. Vegetables also provide the perfect balance of protein. Processed food has little nutritional value, so try to avoid this.  Medications  Resume taking all your medications unless your doctor or physician assistant tells you not to. If your incision is causing pain, you may take over-the-counter pain relievers such as acetaminophen (Tylenol). If you were prescribed a stronger pain medication, please aware these medication can cause nausea and constipation. Prevent nausea by taking the medication with a snack or meal. Avoid constipation by drinking plenty of fluids and eating foods with high amount of fiber, such as fruits, vegetables, and grains. Take Colace 100 mg (an over-the-counter stool softener) twice a day as needed for constipation.  Do not take Tylenol if you are taking prescription pain medications.  Follow Up  Our office will schedule a follow up appointment 2-3 weeks following discharge.  Please call us immediately for any of the following conditions  Severe or worsening pain in your legs or feet while at rest or while walking Increase pain, redness, warmth, or drainage (pus) from your incision site(s) Fever of 101 degree or higher The swelling in your  leg with the bypass suddenly worsens and becomes more painful than when you were in the hospital If you have been instructed to feel your graft pulse then you should do so every day. If you can no longer feel this pulse, call the office immediately. Not all patients are given this instruction.  Leg swelling is common after leg bypass surgery.  The swelling should improve over a few months  following surgery. To improve the swelling, you may elevate your legs above the level of your heart while you are sitting or resting. Your surgeon or physician assistant may ask you to apply an ACE wrap or wear compression (TED) stockings to help to reduce swelling.  Reduce your risk of vascular disease  Stop smoking. If you would like help call [facility] at 1-800-QUIT-NOW 3301513156) or [place] at [phone].  Manage your cholesterol Maintain a desired weight Control your diabetes weight Control your diabetes Keep your blood pressure down  If you have any questions, please call the office at [phone]

## 2018-11-05 NOTE — Evaluation (Signed)
Physical Therapy Evaluation Patient Details Name: Carla Dawson MRN: [id number] DOB: 1931/04/25 Today's Date: 11/05/2018   History of Present Illness  84 y.o. female admitted on 11/04/18 for PVD with claudication.  S/p R iliac thrombectomy, R profunda and femoral tibial bypass graft thrombectomy, R external iliac and common femoral endarterectomy with bovine pericaridal patch angioplasty and wound vac placement.  Pt with significant PMH of PVD, PAD, HTN, COPD, multile LE bypasses and antiographies.    Clinical Impression  Pt was able to walk safely with good speed up to the RN station with RW today.  DOE 2-3/4 with O2 sats dropping to 85% on RA during gait. HR 112 and BP stable prior to mobility with no reports of lightheadedness.  Pt wants to go home today, and has the support of her daughter 24/7 at discharge.  PT to follow acutely for deficits listed below.      Follow Up Recommendations No PT follow up;Supervision for mobility/OOB    Equipment Recommendations  Other (comment)(home O2?)    Recommendations for Other Services   NA    Precautions / Restrictions Precautions Precautions: Other (comment) Precaution Comments: monitor O2 sats Restrictions Weight Bearing Restrictions: No      Mobility  Bed Mobility Overal bed mobility: Needs Assistance Bed Mobility: Supine to Sit     Supine to sit: Modified independent (Device/Increase time)        Transfers Overall transfer level: Needs assistance Equipment used: Rolling walker (2 wheeled) Transfers: Sit to/from Stand Sit to Stand: Min guard         General transfer comment: Min guard assist for safety during transitions.   Ambulation/Gait Ambulation/Gait assistance: Supervision Gait Distance (Feet): 150 Feet Assistive device: Rolling walker (2 wheeled) Gait Pattern/deviations: Step-through pattern     General Gait Details: Pt with good speed and safe RW use.  DOE 2-3/4 with O2 sat drop to 85% on RA  during mobility.          Balance Overall balance assessment: Needs assistance Sitting-balance support: Feet supported;No upper extremity supported Sitting balance-Leahy Scale: Good     Standing balance support: No upper extremity supported Standing balance-Leahy Scale: Fair Standing balance comment: close supervision without UE support.                              Pertinent Vitals/Pain Pain Assessment: Faces Faces Pain Scale: Hurts little more Pain Location: R groin Pain Descriptors / Indicators: Grimacing;Guarding Pain Intervention(s): Limited activity within patient's tolerance;Monitored during session;Repositioned    Home Living Family/patient expects to be discharged to:: Private residence Living Arrangements: Children(daughter) Available Help at Discharge: Family;Available 24 hours/day(daughter is retired, son in law is there too) Type of Home: House Home Access: Stairs to enter Entrance Stairs-Rails: Right Entrance Stairs-Number of Steps: 3-4 Home Layout: One level Home Equipment: [place] - 4 wheels;Shower seat;Hand held shower head;Wheelchair - manual Additional Comments: Pt has a finger pulse ox at home    Prior Function Level of Independence: Independent               Hand Dominance   Dominant Hand: Right    Extremity/Trunk Assessment   Upper Extremity Assessment Upper Extremity Assessment: Overall WFL for tasks assessed    Lower Extremity Assessment Lower Extremity Assessment: RLE deficits/detail RLE Deficits / Details: right leg with some mild impairments due to R groing pain/incision.  Pt able to lift leg against gravity 3-/5 at R hip, knee and  ankle WNL.        Communication   Communication: HOH  Cognition Arousal/Alertness: Awake/alert Behavior During Therapy: WFL for tasks assessed/performed Overall Cognitive Status: Within Functional Limits for tasks assessed                                         General Comments General comments (skin integrity, edema, etc.): HR up to 112 during gait, BP taken prior to mobility and stable.          Assessment/Plan    PT Assessment Patient needs continued PT services  PT Problem List Decreased strength;Decreased activity tolerance;Decreased balance;Decreased mobility;Decreased knowledge of use of DME;Cardiopulmonary status limiting activity;Pain       PT Treatment Interventions DME instruction;Gait training;Stair training;Functional mobility training;Therapeutic activities;Therapeutic exercise;Balance training;Patient/family education    PT Goals (Current goals can be found in the Care Plan section)  Acute Rehab PT Goals Patient Stated Goal: to go home today if they will let her. PT Goal Formulation: With patient Time For Goal Achievement: 11/19/18 Potential to Achieve Goals: Good    Frequency Min 4X/week           AM-PAC PT "6 Clicks" Mobility  Outcome Measure Help needed turning from your back to your side while in a flat bed without using bedrails?: None Help needed moving from lying on your back to sitting on the side of a flat bed without using bedrails?: None Help needed moving to and from a bed to a chair (including a wheelchair)?: A Little Help needed standing up from a chair using your arms (e.g., wheelchair or bedside chair)?: A Little Help needed to walk in hospital room?: A Little Help needed climbing 3-5 steps with a railing? : A Little 6 Click Score: 20    End of Session   Activity Tolerance: Patient tolerated treatment well Patient left: in chair;with call bell/phone within reach;with chair alarm set   PT Visit Diagnosis: Difficulty in walking, not elsewhere classified (R26.2);Pain;Muscle weakness (generalized) (M62.81) Pain - Right/Left: Right Pain - part of body: Leg    Time: 0370-4888 PT Time Calculation (min) (ACUTE ONLY): 28 min   Charges:          Wells Guiles B. [place], PT, DPT  Acute  Rehabilitation [phone] pager #[phone] office   PT Evaluation $PT Eval Moderate Complexity: 1 Mod PT Treatments $Gait Training: 8-22 mins        11/05/2018, 9:02 AM

## 2018-11-05 NOTE — Progress Notes (Deleted)
OT Cancellation Note  Patient Details Name: Carla Dawson MRN: [id number] DOB: Nov 19, 1931   Cancelled Treatment:    Reason Eval/Treat Not Completed: Other (comment). Pt is getting ready for d/c. She lives with daughter who will assist with adls (she does have a vac)  and has BSC, if needed over her toilet. Pt does not feel she has any OT needs at this time  [facility] 11/05/2018, 2:49 PM  Lesle Chris, OTR/L Acute Rehabilitation Services [phone] [facility] pager [phone] office 11/05/2018

## 2018-11-05 NOTE — Progress Notes (Signed)
11/05/2018 SATURATION QUALIFICATIONS: (This note is used to comply with regulatory documentation for home oxygen)  Patient Saturations on Room Air at Rest = 94%  Patient Saturations on Room Air while Ambulating = 85%  Patient Saturations on 2 Liters of oxygen while Ambulating = 95%  Please briefly explain why patient needs home oxygen: Pt desaturates on RA during gait with increased DOE to 3/4.    Barbarann Ehlers Elzina Devera, PT, DPT  Acute Rehabilitation [phone] pager [phone]) [phone] office

## 2018-11-06 ENCOUNTER — Encounter (HOSPITAL_COMMUNITY): Payer: Self-pay | Admitting: Internal Medicine

## 2018-11-06 ENCOUNTER — Emergency Department (HOSPITAL_COMMUNITY)
Admission: EM | Admit: 2018-11-06 | Discharge: 2018-11-06 | Disposition: A | Discharge status: Home / Self Care | Payer: Medicare Other | Attending: Emergency Medicine | Admitting: Emergency Medicine

## 2018-11-06 DIAGNOSIS — Z87891 Personal history of nicotine dependence: Secondary | ICD-10-CM | POA: Insufficient documentation

## 2018-11-06 DIAGNOSIS — M96830 Postprocedural hemorrhage and hematoma of a musculoskeletal structure following a musculoskeletal system procedure: Secondary | ICD-10-CM | POA: Diagnosis not present

## 2018-11-06 DIAGNOSIS — Z79899 Other long term (current) drug therapy: Secondary | ICD-10-CM | POA: Insufficient documentation

## 2018-11-06 DIAGNOSIS — J449 Chronic obstructive pulmonary disease, unspecified: Secondary | ICD-10-CM | POA: Diagnosis not present

## 2018-11-06 DIAGNOSIS — Z7982 Long term (current) use of aspirin: Secondary | ICD-10-CM | POA: Diagnosis not present

## 2018-11-06 DIAGNOSIS — L7622 Postprocedural hemorrhage and hematoma of skin and subcutaneous tissue following other procedure: Secondary | ICD-10-CM | POA: Diagnosis not present

## 2018-11-06 DIAGNOSIS — I10 Essential (primary) hypertension: Secondary | ICD-10-CM | POA: Diagnosis not present

## 2018-11-06 DIAGNOSIS — Z7901 Long term (current) use of anticoagulants: Secondary | ICD-10-CM | POA: Insufficient documentation

## 2018-11-06 DIAGNOSIS — I739 Peripheral vascular disease, unspecified: Secondary | ICD-10-CM | POA: Diagnosis not present

## 2018-11-06 DIAGNOSIS — T148XXA Other injury of unspecified body region, initial encounter: Secondary | ICD-10-CM

## 2018-11-06 NOTE — ED Notes (Signed)
Patient verbalizes understanding of discharge instructions. Opportunity for questioning and answers were provided. Armband removed by staff, pt discharged from ED.  

## 2018-11-06 NOTE — Discharge Instructions (Signed)
Follow the instructions given to you by the vascular surgeon.  Follow-up in the office.

## 2018-11-06 NOTE — ED Notes (Signed)
Vascular at the bedside. 

## 2018-11-06 NOTE — ED Provider Notes (Signed)
[facility] EMERGENCY DEPARTMENT Provider Note   CSN: [id number] Arrival date & time: 11/06/18  5284     History   Chief Complaint Chief Complaint  Patient presents with  . Post-op Problem    HPI Zeinab Rodwell is a 84 y.o. female.  HPI   Selda Jalbert is a 84 y.o. female, with a history of COPD, PAD, HTN, PVD, presenting to the ED with leaking from a wound VAC.  States she had oozing and a small amount of blood into the wound VAC last night.  Woke up this morning with blood soaking her pant leg and the wound VAC reservoir full.  She denies increased pain at the site or in her right leg, fever, abdominal pain, nausea/vomiting, or any other complaints.  Patient is taking aspirin and Plavix daily.  Patient originally had right common femoral artery angioplasty performed November 26 performed by Dr. Trula Slade.  She had pain after discharge and presented to the ED.  She returned to the OR November 27 and underwent right iliac artery thrombectomy, right profunda and femoral tibial bypass graft thrombectomy, right external iliac and common femoral endarterectomy with patch, and placement of Praveena incisional wound VAC.    Past Medical History:  Diagnosis Date  . Allergy    "maybe seasonal allergies" (07/08/2017)  . Arthritis    "hands" (07/08/2017)  . Chicken pox   . COPD (chronic obstructive pulmonary disease) ([place])   . High cholesterol    "took me off pills ~ 1-2 months ago" (07/08/2017)  . History of blood transfusion 2017   "when I had blood clot in my leg"  . Hypertension   . PAD (peripheral artery disease) ([place])    severe/notes 10/14/2016  . Pneumonia ~ 2015  . PVD (peripheral vascular disease) [facility])     Patient Active Problem List   Diagnosis Date Noted  . Femoral artery occlusion, right ([place]) 11/04/2018  . Senile ecchymosis 07/20/2018  . S/P femoral-popliteal bypass surgery 04/12/2018  . Claudication of right lower extremity ([place])  04/09/2018  . Ischemia of extremity 04/09/2018  . COPD with acute exacerbation ([place]) 03/23/2018  . Hyperlipidemia 02/24/2018  . Abnormal CXR 10/26/2016  . COPD  GOLD III  10/07/2016  . Essential hypertension, benign 10/07/2016  . PAD (peripheral artery disease) ([place]) 10/07/2016  . Neuropathy due to peripheral vascular disease ([place]) 10/07/2016    Past Surgical History:  Procedure Laterality Date  . ABDOMINAL AORTOGRAM N/A 04/22/2017   Procedure: Abdominal Aortogram;  Surgeon: Serafina Mitchell, MD;  Location: [facility] CV LAB;  Service: Cardiovascular;  Laterality: N/A;  . ABDOMINAL AORTOGRAM W/LOWER EXTREMITY N/A 03/04/2017   Procedure: Abdominal Aortogram w/Lower Extremity;  Surgeon: Serafina Mitchell, MD;  Location: [place] CV LAB;  Service: Cardiovascular;  Laterality: N/A;  . ABDOMINAL AORTOGRAM W/LOWER EXTREMITY N/A 07/08/2017   Procedure: Abdominal Aortogram w/Lower Extremity;  Surgeon: Serafina Mitchell, MD;  Location: [facility] CV LAB;  Service: Cardiovascular;  Laterality: N/A;  . BIOPSY THYROID    . CATARACT EXTRACTION W/ INTRAOCULAR LENS  IMPLANT, BILATERAL Bilateral   . EMBOLECTOMY Right 04/09/2018   Procedure: RIGHT COMMON FEMORAL ENDARTERECTOMY  WITH BOVINE PATCH ANGIOPLASTY; RIGHT LOWER LEG THROMBOEMBOLECTOMY INTRAOPERATIVE ARTERIOGRAM;  Surgeon: Waynetta Sandy, MD;  Location: [facility];  Service: Vascular;  Laterality: Right;  . ENDARTERECTOMY FEMORAL Right 11/04/2018   Procedure: ENDARTERECTOMY ILIO-FEMORAL ENDARTECTOMY;  Surgeon: Serafina Mitchell, MD;  Location: [facility];  Service: Vascular;  Laterality: Right;  .  FEMORAL-POPLITEAL BYPASS GRAFT Left 04/12/2018   Procedure: Vein Patch Angioplasty Below Knee Popliteal Artery; Left Femoral Endarterectomy with Profundaplasty; Left Femoral-Below Knee Popliteal Bypass Graft using a 4mm by 80cm Thin Wall Propaten Gortex Graft;  Surgeon: Elam Dutch, MD;  Location: [facility];  Service: Vascular;  Laterality: Left;  . HEMATOMA  EVACUATION Right 2017   "S/P procedure; discharged; had to come back for emergency OR"  . LOWER EXTREMITY ANGIOGRAPHY Bilateral 04/22/2017   Procedure: Lower Extremity Angiography;  Surgeon: Serafina Mitchell, MD;  Location: [facility] CV LAB;  Service: Cardiovascular;  Laterality: Bilateral;  . LOWER EXTREMITY ANGIOGRAPHY Bilateral 04/07/2018   Procedure: LOWER EXTREMITY ANGIOGRAPHY;  Surgeon: Serafina Mitchell, MD;  Location: [facility] CV LAB;  Service: Cardiovascular;  Laterality: Bilateral;  . LOWER EXTREMITY ANGIOGRAPHY N/A 11/03/2018   Procedure: LOWER EXTREMITY ANGIOGRAPHY;  Surgeon: Serafina Mitchell, MD;  Location: [place] CV LAB;  Service: Cardiovascular;  Laterality: N/A;  . PATCH ANGIOPLASTY Right 11/04/2018   Procedure: PATCH ANGIOPLASTY WITH Weyman Pedro;  Surgeon: Serafina Mitchell, MD;  [facility] OR;  Service: Vascular;  Laterality: Right;  . PERIPHERAL VASCULAR BALLOON ANGIOPLASTY Left 03/04/2017   Procedure: Peripheral Vascular Balloon Angioplasty;  Surgeon: Serafina Mitchell, MD;  Location: [facility] CV LAB;  Service: Cardiovascular;  Laterality: Left;  SFA and PT  . PERIPHERAL VASCULAR BALLOON ANGIOPLASTY Right 11/03/2018   Procedure: PERIPHERAL VASCULAR BALLOON ANGIOPLASTY;  Surgeon: Serafina Mitchell, MD;  Location: [place] CV LAB;  Service: Cardiovascular;  Laterality: Right;  common femoral  . PERIPHERAL VASCULAR INTERVENTION  07/08/2017  . PERIPHERAL VASCULAR INTERVENTION  07/08/2017   Procedure: Peripheral Vascular Intervention;  Surgeon: Serafina Mitchell, MD;  Location: [place] CV LAB;  Service: Cardiovascular;;  . PERIPHERAL VASCULAR INTERVENTION Left 04/07/2018   Procedure: PERIPHERAL VASCULAR INTERVENTION;  Surgeon: Serafina Mitchell, MD;  Location: [place] CV LAB;  Service: Cardiovascular;  Laterality: Left;  . THROMBECTOMY ILIAC ARTERY Right 11/04/2018   Procedure: THROMBECTOMY RIGHT ILIO AND FEMORAL-TIBIAL BYPASS;  Surgeon: Serafina Mitchell, MD;   Location: [facility] OR;  Service: Vascular;  Laterality: Right;  Marland Kitchen VAGINAL HYSTERECTOMY    . VEIN BYPASS SURGERY       OB History   None      Home Medications    Prior to Admission medications   Medication Sig Start Date End Date Taking? Authorizing Provider  albuterol (PROVENTIL HFA;VENTOLIN HFA) 108 (90 Base) MCG/ACT inhaler INHALE 2 PUFFS BY MOUTH EVERY 4 HOURS AS NEEDED FOR WHEEZING Patient taking differently: Inhale 2 puffs into the lungs every 4 (four) hours as needed for wheezing or shortness of breath.  03/20/18   Tanda Rockers, MD  albuterol (PROVENTIL) (2.5 MG/3ML) 0.083% nebulizer solution INHALE 1 NEBULE VIA NEBULIZER EVERY 4 HOURS AS NEEDED FOR WHEEZING OR SHORTNESS OF BREATH Patient taking differently: Take 2.5 mg by nebulization every 4 (four) hours as needed for wheezing or shortness of breath.  07/01/18   Tanda Rockers, MD  amLODipine (NORVASC) 5 MG tablet Take 1 tablet (5 mg total) by mouth at bedtime. 02/24/18   [place], Betty G, MD  aspirin EC 81 MG tablet Take 81 mg by mouth daily.    [provider]  atorvastatin (LIPITOR) 10 MG tablet Take 1 tablet (10 mg total) by mouth daily at 6 PM. 04/10/18   Rhyne, Hulen Shouts, PA-C  budesonide-formoterol (SYMBICORT) 160-4.5 MCG/ACT inhaler Inhale 2 puffs into the lungs 2 (two) times daily. 05/01/18  Tanda Rockers, MD  cetirizine (ZYRTEC) 10 MG tablet Take 10 mg by mouth at bedtime.     [provider]  clopidogrel (PLAVIX) 75 MG tablet Take 75 mg by mouth at bedtime.  04/10/18   Rhyne, Hulen Shouts, PA-C  dextromethorphan (DELSYM) 30 MG/5ML liquid Take 30 mg by mouth 2 (two) times daily as needed for cough.    [provider]  dextromethorphan-guaiFENesin (MUCINEX DM) 30-600 MG 12hr tablet Take 0.5 tablets by mouth 2 (two) times daily.    [provider]  famotidine (PEPCID) 20 MG tablet Take 20 mg by mouth at bedtime.    [provider]  gabapentin (NEURONTIN) 100 MG capsule Take 1 capsule  (100 mg total) by mouth 3 (three) times daily. 06/09/18   Serafina Mitchell, MD  Multiple Vitamin (MULTIVITAMIN WITH MINERALS) TABS tablet Take 1 tablet by mouth at bedtime.     [provider]  omeprazole (PRILOSEC) 20 MG capsule Take 20 mg by mouth daily.    [provider]  predniSONE (DELTASONE) 10 MG tablet Take 10 mg by mouth See admin instructions. Take one tablet (10 mg) by mouth once daily for 5 days as needed for shortness of breath 10/20/18   [provider]  SPIRIVA RESPIMAT 2.5 MCG/ACT AERS INHALE 2 PUFFS INTO THE LUNGS DAILY Patient taking differently: Inhale 2 puffs into the lungs daily.  07/02/18   Tanda Rockers, MD  traMADol (ULTRAM) 50 MG tablet Take 1 tablet (50 mg total) by mouth every 6 (six) hours as needed for moderate pain. 11/05/18   Gabriel Earing, PA-C    Family History Family History  Problem Relation Age of Onset  . Arthritis Mother   . Hypertension Mother   . Arthritis Father   . Hypertension Father   . Cancer Brother   . Hypertension Son     Social History Social History   Tobacco Use  . Smoking status: Former Smoker    Packs/day: 1.50    Years: 52.00    Pack years: 78.00    Start date: 12/09/1953    Last attempt to quit: 12/09/2005    Years since quitting: 12.9  . Smokeless tobacco: Never Used  Substance Use Topics  . Alcohol use: No  . Drug use: No     Allergies   Lortab [hydrocodone-acetaminophen] and Morphine and related   Review of Systems Review of Systems  Constitutional: Negative for chills and fever.  Respiratory: Negative for shortness of breath.   Cardiovascular: Negative for chest pain.  Gastrointestinal: Negative for abdominal pain, nausea and vomiting.  Musculoskeletal: Negative for arthralgias and myalgias.       Bleeding from around the wound VAC  All other systems reviewed and are negative.    Physical Exam Updated Vital Signs BP (!) 125/58   Pulse 74   Resp 15   Ht 5\' 1"  (1.549 m)   Wt  50.8 kg   SpO2 96%   BMI 21.16 kg/m   Physical Exam  Constitutional: She appears well-developed and well-nourished. No distress.  HENT:  Head: Normocephalic and atraumatic.  Eyes: Conjunctivae are normal.  Neck: Neck supple.  Cardiovascular: Normal rate, regular rhythm, normal heart sounds and intact distal pulses.  Pulses:      Dorsalis pedis pulses are Detected w/ doppler on the right side.       Posterior tibial pulses are Detected w/ doppler on the right side.  Pulmonary/Chest: Effort normal and breath sounds normal. No respiratory  distress.  Abdominal: Soft. There is no tenderness. There is no guarding.  Musculoskeletal: She exhibits no edema.  Patient has a wound VAC in place in the right anterior superior thigh.  Wound VAC sponge appears to be saturated with blood and blood is leaking from around seal.  Wound VAC reservoir appears to be full and a warning alarm is sounding.  Right leg and foot appear to be normal temperature and color.  Lymphadenopathy:    She has no cervical adenopathy.  Neurological: She is alert.  Sensation to light touch grossly intact in the right lower extremity.  Motor function also intact.  Skin: Skin is warm and dry. She is not diaphoretic.  Psychiatric: She has a normal mood and affect. Her behavior is normal.  Nursing note and vitals reviewed.            ED Treatments / Results  Labs (all labs ordered are listed, but only abnormal results are displayed) Labs Reviewed - No data to display  EKG None  Radiology Vas [place] Lower Extremity Arterial Duplex  Result Date: 11/04/2018 LOWER EXTREMITY ARTERIAL DUPLEX STUDY Indications: Pain in right leg following status post right femoral              endarterectomy.               History of known left common femoral high grade stenosis. High Risk Factors: Hypertension, hyperlipidemia, past history of smoking.  Vascular Interventions: 04/12/18 - Left femoral- below the knee popliteal bypass                          graft.                         04/09/18 - Right common femoral endarterectomy with bovine                         patch angioplasty. Current ABI:            N/A Performing Technologist: Oda Cogan RDMS, RVT  Examination Guidelines: A complete evaluation includes B-mode imaging, spectral Doppler, color Doppler, and power Doppler as needed of all accessible portions of each vessel. Bilateral testing is considered an integral part of a complete examination. Limited examinations for reoccurring indications may be performed as noted.  Right Graft #1: +------------------+--------+--------+----------+---------------------+                   PSV cm/sStenosisWaveform  Comments              +------------------+--------+--------+----------+---------------------+ Inflow            0       occluded          prox and mid segments +------------------+--------+--------+----------+---------------------+ Prox Anastomosis  20              monophasic                      +------------------+--------+--------+----------+---------------------+ Proximal Graft                    monophasic                      +------------------+--------+--------+----------+---------------------+ Mid Graft         25              monophasic                      +------------------+--------+--------+----------+---------------------+  Distal Graft      36              monophasic                      +------------------+--------+--------+----------+---------------------+ Distal Anastomosis29              monophasic                      +------------------+--------+--------+----------+---------------------+ Outflow           32              monophasic                      +------------------+--------+--------+----------+---------------------+  Left Graft #1: +--------------------+--------+---------------+----------+--------------+                     PSV cm/sStenosis       Waveform   Comments       +--------------------+--------+---------------+----------+--------------+ Inflow              633     75-99% stenosismonophasicknown stenosis +--------------------+--------+---------------+----------+--------------+ Proximal Anastomosis156                    monophasic               +--------------------+--------+---------------+----------+--------------+ Proximal Graft                                                      +--------------------+--------+---------------+----------+--------------+ Mid Graft                                                           +--------------------+--------+---------------+----------+--------------+ Distal Graft                                                        +--------------------+--------+---------------+----------+--------------+ Distal Anastamosis                                                  +--------------------+--------+---------------+----------+--------------+ Outflow                                                             +--------------------+--------+---------------+----------+--------------+  Summary: Right: The right common femoral artery is thrombosed. The femoral to tibio-peroneal trunk graft is patent with monophasic flow noted throughout the graft. Left: Left common femoral artery is patent with a 75-99% stenosis.  See table(s) above for measurements and observations. Electronically signed by Harold Barban MD on 11/04/2018 at 3:05:58 PM.    Final     Procedures Procedures (including critical care time)  Medications Ordered in ED Medications - No  data to display   Initial Impression / Assessment and Plan / ED Course  I have reviewed the triage vital signs and the nursing notes.  Pertinent labs & imaging results that were available during my care of the patient were reviewed by me and considered in my medical decision making (see chart for details).  Clinical Course as of Nov 07 1147  Fri Nov 06, 2018  1001 Spoke with Dr. Donzetta Matters, vascular surgeon.  States he will come down and assess the patient.   [SJ]    Clinical Course User Index [SJ] Morgaine Kimball C, PA-C    Patient presents with bleeding from around wound VAC seal.  No additional pain or loss of function.  Vascular surgeon replaced the wound VAC, inspected the wound, and cleared the patient for discharge.  Findings and plan of care discussed with Pattricia Boss, MD. Dr. Jeanell Sparrow personally evaluated and examined this patient.  Final Clinical Impressions(s) / ED Diagnoses   Final diagnoses:  Bleeding from wound    ED Discharge Orders    None       Layla Maw 11/06/18 1148    Pattricia Boss, MD 11/07/18 [phone]

## 2018-11-06 NOTE — ED Triage Notes (Signed)
Pt here with leaking wound vac post surgical procedure yesterday. Pt underwent right sided iliac artery thrombectomy and right external iliac/common femoral endarterectomy. Pt states increased bleeding from wound vac this morning. Wound vac appears to be full and bleeding from sign at right groin. Bilateral dorsalis pedis and posterior tibialis pulses heard on doppler US with EDP. VSS at this time.

## 2018-11-09 NOTE — Progress Notes (Signed)
Provena vac has lost its seal and the dressing is filled with blood.  I removed the vac and replaced it.  No active bleeding was identified.  No incisional hematoma.  OK to d/c home   [place]

## 2018-11-16 ENCOUNTER — Ambulatory Visit (INDEPENDENT_AMBULATORY_CARE_PROVIDER_SITE_OTHER): Payer: Medicare Other | Admitting: Cardiology

## 2018-11-16 ENCOUNTER — Other Ambulatory Visit (HOSPITAL_COMMUNITY): Payer: Medicare Other

## 2018-11-16 ENCOUNTER — Encounter: Payer: Self-pay | Admitting: Cardiology

## 2018-11-16 ENCOUNTER — Encounter (HOSPITAL_COMMUNITY): Payer: Medicare Other

## 2018-11-16 ENCOUNTER — Ambulatory Visit: Payer: Medicare Other | Admitting: Surgery

## 2018-11-16 ENCOUNTER — Ambulatory Visit: Payer: Medicare Other | Admitting: Cardiology

## 2018-11-16 VITALS — BP 134/62 | HR 93 | Ht 61.0 in | Wt 115.6 lb

## 2018-11-16 DIAGNOSIS — I10 Essential (primary) hypertension: Secondary | ICD-10-CM | POA: Diagnosis not present

## 2018-11-16 DIAGNOSIS — R002 Palpitations: Secondary | ICD-10-CM | POA: Diagnosis not present

## 2018-11-16 DIAGNOSIS — I491 Atrial premature depolarization: Secondary | ICD-10-CM | POA: Diagnosis not present

## 2018-11-16 DIAGNOSIS — Z95828 Presence of other vascular implants and grafts: Secondary | ICD-10-CM | POA: Diagnosis not present

## 2018-11-16 DIAGNOSIS — I779 Disorder of arteries and arterioles, unspecified: Secondary | ICD-10-CM

## 2018-11-16 DIAGNOSIS — I739 Peripheral vascular disease, unspecified: Secondary | ICD-10-CM

## 2018-11-16 DIAGNOSIS — R0609 Other forms of dyspnea: Secondary | ICD-10-CM

## 2018-11-16 NOTE — Patient Instructions (Signed)
Medication Instructions:  Continue same medications If you need a refill on your cardiac medications before your next appointment, please call your pharmacy.   Lab work: None ordered   Testing/Procedures: Schedule 14 day Zio Monitor Schedule Echo  Follow-Up: At [facility], you and your health needs are our priority.  As part of our continuing mission to provide you with exceptional heart care, we have created designated Provider Care Teams.  These Care Teams include your primary Cardiologist (physician) and Advanced Practice Providers (APPs -  Physician Assistants and Nurse Practitioners) who all work together to provide you with the care you need, when you need it. . Schedule follow up appointment with Dr.Christopher in 3 months

## 2018-11-16 NOTE — Progress Notes (Signed)
Cardiology Office Note:    Date:  11/16/2018   ID:  Carla Dawson, DOB [birth year], MRN [id number]  PCP:  Carla, Betty G, MD  Cardiologist:  Buford Dresser, MD PhD  Referring MD: Carla, Betty G, MD   CC: abnormal heart rhythm  History of Present Illness:    Carla Dawson is a 84 y.o. female with a hx of extensive PAD history s/p multiple procedures, COPD, former tobacco use who is seen as a new consult at the request of Carla, Carla So, MD for the evaluation and management of arrhythmia.  Tachycardia/palpitations: -Initial onset: one episode in [place], [place] and one in the hospital in May. Reports that it has been picked up periodically, most frequent three times in a week. Generally these have been incidental findings, she did feel her heart "racing away from her" during her hospitalization in [place] in 2016.  -Frequency: Unclear, as most events have been asymptomatic -Duration: brief, does not think there have been any prolonged episodes -Associated symptoms: none -Aggravating/alleviating factors: none -Syncope/near syncope: none -Prior cardiac history: no prior cardiac history, but does have extensive vascular history -Prior ECG: sinus with PAC -Prior workup: none -Prior treatment: none -Possible medication interactions:albuterol, prednisone -Alcohol: none -Tobacco: fomer, quit 2007  -Exercise level: minimal with recent procedures -Labs: TSH, kidney function/electrolytes, CBC reviewed. -Cardiac ROS: no chest pain, no PND, no orthopnea  Overall she feels like she is on the upswing from her recent vascular procedures. She is still tired/short of breath with exertion, and with walking a short distance her O2 sat dropped to 88%. Her daughter notes that this has happened in the past after hospitalizations, and she recovers eventually. She has declined home O2.   No chest pain, PND orthopnea. Has chronic trace LE edema in left leg. No syncope.  Past  Medical History:  Diagnosis Date  . Allergy    "maybe seasonal allergies" (07/08/2017)  . Arthritis    "hands" (07/08/2017)  . Chicken pox   . COPD (chronic obstructive pulmonary disease) ([place])   . High cholesterol    "took me off pills ~ 1-2 months ago" (07/08/2017)  . History of blood transfusion 2017   "when I had blood clot in my leg"  . Hypertension   . PAD (peripheral artery disease) ([place])    severe/notes 10/14/2016  . Pneumonia ~ 2015  . PVD (peripheral vascular disease) ([place])     Past Surgical History:  Procedure Laterality Date  . ABDOMINAL AORTOGRAM N/A 04/22/2017   Procedure: Abdominal Aortogram;  Surgeon: Serafina Mitchell, MD;  Location: [place] [facility];  Service: Cardiovascular;  Laterality: N/A;  . ABDOMINAL AORTOGRAM W/LOWER EXTREMITY N/A 03/04/2017   Procedure: Abdominal Aortogram w/Lower Extremity;  Surgeon: Serafina Mitchell, MD;  Location: [place] [facility];  Service: Cardiovascular;  Laterality: N/A;  . ABDOMINAL AORTOGRAM W/LOWER EXTREMITY N/A 07/08/2017   Procedure: Abdominal Aortogram w/Lower Extremity;  Surgeon: Serafina Mitchell, MD;  Location: [place] [facility];  Service: Cardiovascular;  Laterality: N/A;  . BIOPSY THYROID    . CATARACT EXTRACTION W/ INTRAOCULAR LENS  IMPLANT, BILATERAL Bilateral   . EMBOLECTOMY Right 04/09/2018   Procedure: RIGHT COMMON FEMORAL ENDARTERECTOMY  WITH BOVINE PATCH ANGIOPLASTY; RIGHT LOWER LEG THROMBOEMBOLECTOMY INTRAOPERATIVE ARTERIOGRAM;  Surgeon: Waynetta Sandy, MD;  Location: [place];  Service: Vascular;  Laterality: Right;  . ENDARTERECTOMY FEMORAL Right 11/04/2018   Procedure: ENDARTERECTOMY ILIO-FEMORAL ENDARTECTOMY;  Surgeon: Serafina Mitchell, MD;  Location: [facility];  Service: Vascular;  Laterality: Right;  . FEMORAL-POPLITEAL BYPASS GRAFT Left 04/12/2018   Procedure: Vein Patch Angioplasty Below Knee Popliteal Artery; Left Femoral Endarterectomy with Profundaplasty; Left Femoral-Below Knee Popliteal Bypass Graft using a  76mm by 80cm Thin Wall Propaten Gortex Graft;  Surgeon: Elam Dutch, MD;  Location: [facility];  Service: Vascular;  Laterality: Left;  . HEMATOMA EVACUATION Right 2017   "S/P procedure; discharged; had to come back for emergency OR"  . LOWER EXTREMITY ANGIOGRAPHY Bilateral 04/22/2017   Procedure: Lower Extremity Angiography;  Surgeon: Serafina Mitchell, MD;  Location: [place] [facility];  Service: Cardiovascular;  Laterality: Bilateral;  . LOWER EXTREMITY ANGIOGRAPHY Bilateral 04/07/2018   Procedure: LOWER EXTREMITY ANGIOGRAPHY;  Surgeon: Serafina Mitchell, MD;  Location: [place] [facility];  Service: Cardiovascular;  Laterality: Bilateral;  . LOWER EXTREMITY ANGIOGRAPHY N/A 11/03/2018   Procedure: LOWER EXTREMITY ANGIOGRAPHY;  Surgeon: Serafina Mitchell, MD;  Location: [place] [facility];  Service: Cardiovascular;  Laterality: N/A;  . PATCH ANGIOPLASTY Right 11/04/2018   Procedure: PATCH ANGIOPLASTY WITH Weyman Pedro;  Surgeon: Serafina Mitchell, MD;  Location: [facility];  Service: Vascular;  Laterality: Right;  . PERIPHERAL VASCULAR BALLOON ANGIOPLASTY Left 03/04/2017   Procedure: Peripheral Vascular Balloon Angioplasty;  Surgeon: Serafina Mitchell, MD;  Location: [place] [facility];  Service: Cardiovascular;  Laterality: Left;  SFA and PT  . PERIPHERAL VASCULAR BALLOON ANGIOPLASTY Right 11/03/2018   Procedure: PERIPHERAL VASCULAR BALLOON ANGIOPLASTY;  Surgeon: Serafina Mitchell, MD;  Location: [place] [facility];  Service: Cardiovascular;  Laterality: Right;  common femoral  . PERIPHERAL VASCULAR INTERVENTION  07/08/2017  . PERIPHERAL VASCULAR INTERVENTION  07/08/2017   Procedure: Peripheral Vascular Intervention;  Surgeon: Serafina Mitchell, MD;  Location: [place] [facility];  Service: Cardiovascular;;  . PERIPHERAL VASCULAR INTERVENTION Left 04/07/2018   Procedure: PERIPHERAL VASCULAR INTERVENTION;  Surgeon: Serafina Mitchell, MD;  Location: [place] [facility];  Service: Cardiovascular;  Laterality: Left;    . THROMBECTOMY ILIAC ARTERY Right 11/04/2018   Procedure: THROMBECTOMY RIGHT ILIO AND FEMORAL-TIBIAL BYPASS;  Surgeon: Serafina Mitchell, MD;  [facility];  Service: Vascular;  Laterality: Right;  Marland Kitchen VAGINAL HYSTERECTOMY    . VEIN BYPASS SURGERY      Current Medications: Current Outpatient Medications on File Prior to Visit  Medication Sig  . albuterol (PROVENTIL HFA;VENTOLIN HFA) 108 (90 Base) MCG/ACT inhaler INHALE 2 PUFFS BY MOUTH EVERY 4 HOURS AS NEEDED FOR WHEEZING (Patient taking differently: Inhale 2 puffs into the lungs every 4 (four) hours as needed for wheezing or shortness of breath. )  . albuterol (PROVENTIL) (2.5 MG/3ML) 0.083% nebulizer solution INHALE 1 NEBULE VIA NEBULIZER EVERY 4 HOURS AS NEEDED FOR WHEEZING OR SHORTNESS OF BREATH (Patient taking differently: Take 2.5 mg by nebulization every 4 (four) hours as needed for wheezing or shortness of breath. )  . amLODipine (NORVASC) 5 MG tablet Take 1 tablet (5 mg total) by mouth at bedtime.  Marland Kitchen aspirin EC 81 MG tablet Take 81 mg by mouth daily.  Marland Kitchen atorvastatin (LIPITOR) 10 MG tablet Take 1 tablet (10 mg total) by mouth daily at 6 PM.  . budesonide-formoterol (SYMBICORT) 160-4.5 MCG/ACT inhaler Inhale 2 puffs into the lungs 2 (two) times daily.  . cetirizine (ZYRTEC) 10 MG tablet Take 10 mg by mouth at bedtime.   . clopidogrel (PLAVIX) 75 MG tablet Take 75 mg by mouth at bedtime.   Marland Kitchen dextromethorphan (DELSYM) 30 MG/5ML liquid Take 30 mg by mouth 2 (  two) times daily as needed for cough.  . dextromethorphan-guaiFENesin (MUCINEX DM) 30-600 MG 12hr tablet Take 0.5 tablets by mouth 2 (two) times daily.  . famotidine (PEPCID) 20 MG tablet Take 20 mg by mouth at bedtime.  . gabapentin (NEURONTIN) 100 MG capsule Take 1 capsule (100 mg total) by mouth 3 (three) times daily.  . Multiple Vitamin (MULTIVITAMIN WITH MINERALS) TABS tablet Take 1 tablet by mouth at bedtime.   Marland Kitchen omeprazole (PRILOSEC) 20 MG capsule Take 20 mg by mouth daily.  .  predniSONE (DELTASONE) 10 MG tablet Take 10 mg by mouth See admin instructions. Take one tablet (10 mg) by mouth once daily for 5 days as needed for shortness of breath  . SPIRIVA RESPIMAT 2.5 MCG/ACT AERS INHALE 2 PUFFS INTO THE LUNGS DAILY (Patient taking differently: Inhale 2 puffs into the lungs daily. )  . traMADol (ULTRAM) 50 MG tablet Take 1 tablet (50 mg total) by mouth every 6 (six) hours as needed for moderate pain.   No current facility-administered medications on file prior to visit.      Allergies:   Lortab [hydrocodone-acetaminophen] and Morphine and related   Social History   Socioeconomic History  . Marital status: Divorced    Spouse name: Not on file  . Number of children: Not on file  . Years of education: Not on file  . Highest education level: Not on file  Occupational History  . Not on file  Social Needs  . Financial resource strain: Not on file  . Food insecurity:    Worry: Not on file    Inability: Not on file  . Transportation needs:    Medical: Not on file    Non-medical: Not on file  Tobacco Use  . Smoking status: Former Smoker    Packs/day: 1.50    Years: 52.00    Pack years: 78.00    Start date: 12/09/1953    Last attempt to quit: 12/09/2005    Years since quitting: 12.9  . Smokeless tobacco: Never Used  Substance and Sexual Activity  . Alcohol use: No  . Drug use: No  . Sexual activity: Not Currently  Lifestyle  . Physical activity:    Days per week: Not on file    Minutes per session: Not on file  . Stress: Not on file  Relationships  . Social connections:    Talks on phone: Not on file    Gets together: Not on file    Attends religious service: Not on file    Active member of club or organization: Not on file    Attends meetings of clubs or organizations: Not on file    Relationship status: Not on file  Other Topics Concern  . Not on file  Social History Narrative  . Not on file     Family History: The patient's family history  includes Arthritis in her father and mother; Cancer in her brother; Hypertension in her father, mother, and son.  ROS:   Please see the history of present illness.  Additional pertinent ROS:  Constitutional: Negative for chills, fever, night sweats, unintentional weight loss  HENT: Negative for ear pain and hearing loss.   Eyes: Negative for loss of vision and eye pain.  Respiratory: Positive for dyspnea on exertion, cough. Negative for sputum, wheezing.   Cardiovascular: Negative for chest pain, palpitations, PND, orthopnea, and claudication currently.  Gastrointestinal: Negative for abdominal pain, melena, and hematochezia.  Genitourinary: Negative for dysuria and hematuria.  Musculoskeletal: Negative  for falls and myalgias.  Skin: Negative for itching and rash.  Neurological: Negative for focal weakness, focal sensory changes and loss of consciousness.  Endo/Heme/Allergies: Does bruise/bleed easily.    EKGs/Labs/Other Studies Reviewed:    The following studies were reviewed today:  Scanned echo report from 2016: Mild cLVH, EF >55%, small pericardial effusion, mild MR/TR/PR, normal right sided pressures  EKG:  EKG is personally reviewed.  The ekg ordered today demonstrates sinus rhythm with PACs, possible prior septal infarct  Recent Labs: 11/04/2018: ALT 11 11/05/2018: BUN 12; Creatinine, Ser 0.93; Hemoglobin 9.9; Platelets 172; Potassium 4.4; Sodium 136  Recent Lipid Panel    Component Value Date/Time   CHOL 148 02/27/2018 0809   TRIG 77.0 02/27/2018 0809   HDL 59.80 02/27/2018 0809   CHOLHDL 2 02/27/2018 0809   VLDL 15.4 02/27/2018 0809   LDLCALC 73 02/27/2018 0809    Physical Exam:    VS:  BP 134/62 (BP Location: Left Arm, Patient Position: Sitting, Cuff Size: Normal)   Pulse 93   Ht 5\' 1"  (1.549 m)   Wt 115 lb 9.6 oz (52.4 kg)   BMI 21.84 kg/m     Wt Readings from Last 3 Encounters:  11/16/18 115 lb 9.6 oz (52.4 kg)  11/06/18 112 lb (50.8 kg)  11/04/18 112  lb (50.8 kg)     GEN: Well nourished, well developed in no acute distress HEENT: Normal NECK: No JVD, supple LYMPHATICS: No lymphadenopathy CARDIAC: regular with periodic early beats, normal S1 and S2, no murmurs, rubs, gallops. Radial pulses 2+ bilaterally. DP pulses trace-1+ bilaterally. RESPIRATORY:  Distant but clear to auscultation without rales, wheezing or rhonchi  ABDOMEN: Soft, non-tender, non-distended MUSCULOSKELETAL:  Trace LLE edema, no RLE edema; No deformity  SKIN: Warm and dry NEUROLOGIC:  Alert and oriented x 3 PSYCHIATRIC:  Normal affect   ASSESSMENT:    1. Palpitations   2. Essential hypertension, benign   3. PAD (peripheral artery disease) ([place])   4. S/P femoral-popliteal bypass surgery   5. PAC (premature atrial contraction)   6. Dyspnea on exertion    PLAN:    1. Palpitations, PACs: has not been fully evaluated. Given her lung disease, suspect atrial arrhythmia. If it is intermittent PACs, may not require treatment, but MAT/other SVT would require rate control, whereas atrial fib/flutter would also need anticoagulation. Largely asymptomatic, seem to be triggered by stress/surgery -14 day Zio patch -echocardiogram  2. PAD, s/p multiple procedures including bypass and endarterectomy: recovering from recent procedure, with plans for left sided procedure in the near future -former smoker -on aspirin and clopidogrel -on atorvastatin, last LDL 73  3. Hypertension: reasonable control given her age. On amlodipine  4. Dyspnea on exertion: she does have COPD and sees Dr. Melvyn Novas tomorrow. She dropped her O2 sats with minimal ambulation but declines further evaluation today. States that it always improves a week or two after she is out of the hospital. It is concerning for anginal equivalent given her known PAD  -echocardiogram  Plan for follow up: 3 mos or sooner based on results of testing.  TIME SPENT WITH PATIENT: >45 minutes of direct patient care. More than  50% of that time was spent on coordination of care and counseling regarding arrhythmia, shortness of breath, workup and management.  Buford Dresser, MD, PhD [place]  [facility]   Medication Adjustments/Labs and Tests Ordered: Current medicines are reviewed at length with the patient today.  Concerns regarding medicines are outlined above.  Orders Placed  This Encounter  Procedures  . LONG TERM MONITOR (3-14 DAYS)  . EKG 12-Lead  . ECHOCARDIOGRAM COMPLETE   No orders of the defined types were placed in this encounter.   Patient Instructions  Medication Instructions:  Continue same medications If you need a refill on your cardiac medications before your next appointment, please call your pharmacy.   Lab work: None ordered   Testing/Procedures: Schedule 14 day Zio Monitor Schedule Echo  Follow-Up: At [facility], you and your health needs are our priority.  As part of our continuing mission to provide you with exceptional heart care, we have created designated Provider Care Teams.  These Care Teams include your primary Cardiologist (physician) and Advanced Practice Providers (APPs -  Physician Assistants and Nurse Practitioners) who all work together to provide you with the care you need, when you need it. . Schedule follow up appointment with Dr.Rakeem Colley in 3 months      Signed, Buford Dresser, MD PhD 11/16/2018 2:57 PM    [place]

## 2018-11-17 ENCOUNTER — Encounter: Payer: Self-pay | Admitting: Internal Medicine

## 2018-11-17 ENCOUNTER — Ambulatory Visit (INDEPENDENT_AMBULATORY_CARE_PROVIDER_SITE_OTHER): Payer: Medicare Other | Admitting: Internal Medicine

## 2018-11-17 ENCOUNTER — Telehealth: Payer: Self-pay | Admitting: Internal Medicine

## 2018-11-17 VITALS — BP 130/58 | HR 62 | Ht 61.0 in | Wt 115.0 lb

## 2018-11-17 DIAGNOSIS — Z23 Encounter for immunization: Secondary | ICD-10-CM

## 2018-11-17 DIAGNOSIS — J449 Chronic obstructive pulmonary disease, unspecified: Secondary | ICD-10-CM | POA: Diagnosis not present

## 2018-11-17 DIAGNOSIS — I779 Disorder of arteries and arterioles, unspecified: Secondary | ICD-10-CM | POA: Diagnosis not present

## 2018-11-17 MED ORDER — BUDESONIDE-FORMOTEROL FUMARATE 160-4.5 MCG/ACT IN AERO: 2.0000 | INHALATION_SPRAY | Freq: Two times a day (BID) | RESPIRATORY_TRACT | 0 refills | Status: DC

## 2018-11-17 MED ORDER — PREDNISONE 10 MG PO TABS: ORAL_TABLET | ORAL | 0 refills | Status: DC

## 2018-11-17 NOTE — Telephone Encounter (Signed)
Called spoke to Pharmacy they wanted clarification if MD meant to order 100 tablets, MW did want to order that many. Pharmacy states they will put in the order and let the patient know when ready.  Nothing further is needed

## 2018-11-17 NOTE — Patient Instructions (Signed)
Plan A = Automatic = continue  symbicort 160 x 2 puffs then spiriva 2 pffs and 12 hours later just the symbicort 160 x 2 pffs    Plan B = Backup Only use your albuterol as a rescue medication to be used if you can't catch your breath by resting or doing a relaxed purse lip breathing pattern.  - The less you use it, the better it will work when you need it. - Ok to use the inhaler up to 2 puffs  every 4 hours if you must but call for appointment if use goes up over your usual need - Don't leave home without it !!  (think of it like the spare tire for your car)   Plan C = Crisis - only use your albuterol nebulizer if you first try Plan B and it fails to help > ok to use the nebulizer up to every 4 hours but if start needing it regularly call for immediate appointment   Plan D = Deltasone = prednisone 10 mg 2 daily until all better then 1 daily x 5 days and stop    For cough mucinex dm 1200 mg every 12 hours with the flutter valve  Try prilosec otc 20mg   Take 30-60 min before first meal of the day and Pepcid ac (famotidine) 20 mg one @  bedtime until cough is completely gone for at least a week without the need for cough suppression     Please schedule a follow up office visit in 6  weeks, call sooner if needed with all medications /inhalers/ solutions in hand so we can verify exactly what you are taking. This includes all medications from all doctors and over the [place] separate them into two bags:  the ones you take automatically, no matter what, vs the ones you take just when you feel you need them "BAG #2 is UP TO YOU"  - this will really help [place] help you take your medications more effectively.  Also bring the flutter valve.

## 2018-11-17 NOTE — Progress Notes (Signed)
Subjective:   Patient ID: Carla Dawson, female    DOB: September 12, 1931,    MRN: [id number]     Brief patient profile:  60 yowf  Quit smoking 2007 with dx of copd in 2002 while living in [place] near [place] rx with Advair then Breo since quit smoking referred to pulmonary clinic 10/25/2016 by Dr   Betty Martinique   History of Present Illness  10/25/2016 1st [facility] office visit/ [facility]   Chief Complaint  Patient presents with  . Pulmonary Consult    Referred by Dr. Betty Martinique.  Pt here to est care for COPD. She c/o SOB with walking approx 800 ft on a flat surface, eating and walking up an incline. She has some PND but not coughing much.    MMRC2 = can't walk a nl pace on a flat grade s sob but does fine slow and flat eg walking up to 800 ft - she measures the distances herself to keep tack as also limited by claudication - rarely feels need for saba  rec Work on inhaler technique:  relax and gently blow all the way out then take a nice smooth deep breath back in, triggering the inhaler at same time you start breathing in.  Hold for up to 5 seconds if you can. Blow out thru nose. Rinse and gargle with water when done Plan A = Automatic = Symbicort 160 Take 2 puffs first thing in am and then another 2 puffs about 12 hours later.  Plan B = Backup Only use your albuterol (proair) as a rescue medication Plan C = Crisis - only use your albuterol nebulizer if you first try Plan B and it fails to help > ok to use the nebulizer up to every 4 hours but if start needing it regularly call for immediate appointment   02/27/2017  f/u ov/Wert re:  Copd / 02 prn maint on advair 250 bid and prn saba  Chief Complaint  Patient presents with  . Follow-up    Pt states was hospitalized while in [place] early March 2018. She states she was told that she may have had the beginning of PNA. She states today her breathing is "great". She has occ non prod cough. She was sent home with o2, but has  not used in the past several days.   breathing worse suddenly while in [place] not responsive  To albuterol or 2lpm > admit x 4 days and back to baseline  MMRC2 = can't walk a nl pace on a flat grade s sob but does fine slow and flat  rec Plan A = Automatic = Breo one click each am > take two good deep drags off it but click it just once  Plan B = Backup Only use your albuterol (PROAIR) as a rescue medication Plan C = Crisis - only use your albuterol nebulizer if you first try Plan B         10/14/2017  f/u ov/Wert re:   GOLD III / maint on BREO daily / has 02 / not using  Chief Complaint  Patient presents with  . Follow-up    Pt plans to go to [place] soon for a month and wants to discuss plan to "keep from having to take prednisone".  She states she is using her proair rarely, but has been using her neb recently due to chest tightness.   doe =  MMRC3 = can't walk 100 yards even at a slow pace at  a flat grade s stopping due to sob  Can do HT but not sam's   Very hoarse on breo 100/ poor hfa  Sleeps ok  rec Plan A = Automatic = dulera 100 Take 2 puffs first thing in am and then another 2 puffs about 12 hours later.  Work on inhaler technique: Plan B = Backup Only use your albuterol [employer])  as a rescue medication  Plan C = Crisis - only use your albuterol nebulizer if you first try Plan B and it fails to help > ok to use the nebulizer up to every 4 hours but if start needing it regularly call for immediate appointment Plan D = Deltasone take 6 days if you are having to use plan c and not working great Plan E  = ER, go there if all elso fails      02/04/2018  f/u ov/Wert re:  COPD III /one flare in Dec 2018 / BREO maint but req one course pred in last 3 m for aecopd  Chief Complaint  Patient presents with  . Follow-up    Last night she was eating and aspirated hamburger. She states that she feels pain in her throat when she takes a deep breath.  Her breathing is unchanged. She  is using her proair 3-4 x per wk on average and rarely uses neb.   Dyspnea:  MMRC3 = can't walk 100 yards even at a slow pace at a flat grade s stopping due to sob   Cough: not normally/  Sleep: ok/ no 02  SABA use:   No more than once a day/ maybe p shower  rec Plan A = Automatic = Breo or Trelegy daily  Work on inhaler technique:   Plan B = Backup Only use your albuterol (Proair)  as a rescue medication  Plan C = Crisis - only use your albuterol nebulizer if you first try Plan B Plan D = Deltasone take 6 days if you are having to use plan c and not working great Plan E  = ER, go there if all elso fails        03/23/2018 acute extended ov/Wert re:  aecopd x 3 days/ did not start Plan D/ tol neb poorly due to tremors Chief Complaint  Patient presents with  . Acute Visit    productive cough yellow mucus, sob ,cough since 4/12   was doing about the same prior to flare 03/20/18 with cough/ congestion/ thick mucus / no fever c/p and comfortable at rest but trouble sleeping due to cough / chest tight  rec zpak  Take your refillable prednisone and remember it is plan = D = Prednisone 10 mg take  4 each am x 2 days,   2 each am x 2 days,  1 each am x 2 days and stop.       04/03/2018 acute extended ov/Wert re:  aecopd  Chief Complaint  Patient presents with  . Acute Visit    Improved slightly after visit 03/23/18 and then worsened again 2-3 days ago- cough with light yellow sputum and increased SOB. She is using her albuterol inhaler 4-5 x per day and neb at least 3 x per day.    on trelegy p zpak / prednisone was only a little  better as it turns out = ok only at rest whereas baseline = walking house x 500 steps at a time s stopping - did not less saba need p prednisone but concerned getting worse  x 3 d prior to OV  And needs vascular sugery 04/07/18 and doesn't want to be turned down  Mucus turned clear on zpak, then slt yellow and thick since despite rx with mucinex rec Depomedrol 120  mg today  Plan A = Automatic = stop trelegy for now and start symbicort 160 x 2 puffs then spiriva 2 pffs and 12 hours later just the symbicort 160 x 2 pffs Work on inhaler technique:  Plan B = Backup Only use your albuterol as a rescue medication Plan C = Crisis - only use your albuterol nebulizer if you first try Plan B and it fails to help > ok to use the nebulizer up to every 4 hours but if start needing it regularly call for immediate appointment Plan D = Deltasone = prednisone 10 mg 2 daily until all better then 1 daily x 3 days and stop  For cough mucinex dm 1200 mg every 12 hours  Try prilosec otc 20mg   Take 30-60 min before first meal of the day and Pepcid ac (famotidine) 20 mg one @  bedtime until cough is completely gone for at least a week without the need for cough suppression Return here when you come back for [place]     06/29/2018 acute  ov/Wert re: sob/cough c/w aecopd in pt with GOLD III criteria  Chief Complaint  Patient presents with  . Acute Visit    increased SOB, cough with yellow sputum and wheezing for the past 5-6 days. She has started using her neb with albuterol 3 x daily.   did fine in June and most of July 2019 on Symb / spiriva then acutely ill x about a week and did not follow written action plan but started using neb as above with some relief and typically comfortable at rest p neb but doe x across the room assoc the purulent sputum,subj wheezing worse at hs rec Depomedrol 120 mg today  Zpak  Plan A = Automatic = continue  symbicort 160 x 2 puffs then spiriva 2 pffs and 12 hours later just the symbicort 160 x 2 pffs Work on inhaler technique:  relax and gently blow all the way out then take a nice smooth deep breath back in, triggering the inhaler at same time you start breathing in.  Hold for up to 5 seconds if you can. Blow out thru nose. Rinse and gargle with water when done Plan B = Backup Only use your albuterol as a rescue medication  Plan C =  Crisis - only use your albuterol nebulizer if you first try Plan B and it fails to help > ok to use the nebulizer up to every 4 hours but if start needing it regularly call for immediate appointment Plan D = Deltasone = prednisone 10 mg 2 daily until all better then 1 daily x 5 days and stop  For cough mucinex dm 1200 mg every 12 hours with the flutter valve Try prilosec otc 20mg   Take 30-60 min before first meal of the day and Pepcid ac (famotidine) 20 mg one @  bedtime until cough is completely gone for at least a week without the need for cough suppression    07/29/2018  f/u ov/Wert re:  GOLD III copd/ intol of dpi  Chief Complaint  Patient presents with  . Follow-up    pt states she is doing well, wants to discuss meds.  pt in [place].    Dyspnea:  Walks slow anywhere/ no steps =  MMRC2 = can't walk a nl pace on a flat grade s sob but does fine slow and flat  Cough: min / much worse with dpi to point of wheeze > neb > pred now resolved  Sleeping: flat one pillow SABA use: rare 02: no   rec Work on inhaler   11/17/2018  f/u ov/Wert re: GOLD III  Chief Complaint  Patient presents with  . Follow-up    breathing worse since 11/04/18 when she had procedure done on her leg.  She is using her albuterol inhaler once daily on average and neb 3 x daily.   Dyspnea:  50 ft and limited by  R leg claudication f/u [place] Cough: white mucus worse in am / thick  Sleeping: 30 degrees SABA use: praoair not working / neb needed and on pred taper though not using the way it was last rec = 2 until better then 1 daily x 5 days and stop   02: none  No obvious day to day or daytime variability or assoc  purulent sputum or mucus plugs or hemoptysis or cp or chest tightness, subjective wheeze or overt sinus or hb symptoms.   Sleeping ok with hob elevated  without nocturnal   exacerbation  of respiratory  c/o's or need for noct saba. Also denies any obvious fluctuation of symptoms with weather or  environmental changes or other aggravating or alleviating factors except as outlined above   No unusual exposure hx or h/o childhood pna/ asthma or knowledge of premature birth.  Current Allergies, Complete Past Medical History, Past Surgical History, Family History, and Social History were reviewed in [employer] record.  ROS  The following are not active complaints unless bolded Hoarseness, sore throat, dysphagia, dental problems, itching, sneezing,  nasal congestion or discharge of excess mucus or purulent secretions, ear ache,   fever, chills, sweats, unintended wt loss or wt gain, classically pleuritic or exertional cp,  orthopnea pnd or arm/hand swelling  or leg swelling, presyncope, palpitations, abdominal pain, anorexia, nausea, vomiting, diarrhea  or change in bowel habits or change in bladder habits, change in stools or change in urine, dysuria, hematuria,  rash, arthralgias, visual complaints, headache, numbness, weakness or ataxia or problems with walking or coordination,  change in mood or  memory.        Current Meds  Medication Sig  . albuterol (PROVENTIL HFA;VENTOLIN HFA) 108 (90 Base) MCG/ACT inhaler INHALE 2 PUFFS BY MOUTH EVERY 4 HOURS AS NEEDED FOR WHEEZING (Patient taking differently: Inhale 2 puffs into the lungs every 4 (four) hours as needed for wheezing or shortness of breath. )  . albuterol (PROVENTIL) (2.5 MG/3ML) 0.083% nebulizer solution INHALE 1 NEBULE VIA NEBULIZER EVERY 4 HOURS AS NEEDED FOR WHEEZING OR SHORTNESS OF BREATH (Patient taking differently: Take 2.5 mg by nebulization every 4 (four) hours as needed for wheezing or shortness of breath. )  . amLODipine (NORVASC) 5 MG tablet Take 1 tablet (5 mg total) by mouth at bedtime.  Marland Kitchen aspirin EC 81 MG tablet Take 81 mg by mouth daily.  Marland Kitchen atorvastatin (LIPITOR) 10 MG tablet Take 1 tablet (10 mg total) by mouth daily at 6 PM.  . budesonide-formoterol (SYMBICORT) 160-4.5 MCG/ACT inhaler Inhale 2  puffs into the lungs 2 (two) times daily.  . cetirizine (ZYRTEC) 10 MG tablet Take 10 mg by mouth at bedtime.   . clopidogrel (PLAVIX) 75 MG tablet Take 75 mg by mouth at bedtime.   Marland Kitchen dextromethorphan (DELSYM) 30 MG/5ML liquid  Take 30 mg by mouth 2 (two) times daily as needed for cough.  . dextromethorphan-guaiFENesin (MUCINEX DM) 30-600 MG 12hr tablet Take 0.5 tablets by mouth 2 (two) times daily.  . famotidine (PEPCID) 20 MG tablet Take 20 mg by mouth at bedtime.  . gabapentin (NEURONTIN) 100 MG capsule Take 1 capsule (100 mg total) by mouth 3 (three) times daily.  . Multiple Vitamin (MULTIVITAMIN WITH MINERALS) TABS tablet Take 1 tablet by mouth at bedtime.   Marland Kitchen omeprazole (PRILOSEC) 20 MG capsule Take 20 mg by mouth daily.  . predniSONE (DELTASONE) 10 MG tablet 2 daily until  Bette then one daily x 5 days and stop  . SPIRIVA RESPIMAT 2.5 MCG/ACT AERS INHALE 2 PUFFS INTO THE LUNGS DAILY (Patient taking differently: Inhale 2 puffs into the lungs daily. )  . traMADol (ULTRAM) 50 MG tablet Take 1 tablet (50 mg total) by mouth every 6 (six) hours as needed for moderate pain.  . [DISCONTINUED] predniSONE (DELTASONE) 10 MG tablet Take 10 mg by mouth See admin instructions. Take one tablet (10 mg) by mouth once daily for 5 days as needed for shortness of breath                Objective:   Physical Exam  amb thin wf nad   11/17/2018    115  07/29/2018      112  06/29/2018      113  04/03/2018       110  03/23/2018       111  02/04/2018       109  10/14/2017       108  04/03/2017       100   02/27/2017      96  10/25/16 92 lb 9.6 oz (42 kg)  10/15/16 93 lb 9 oz (42.4 kg)  10/14/16 93 lb (42.2 kg)      Vital signs reviewed - Note on arrival 02 sats  100% on RA   HEENT: Full dentures/ nl oropharynx. Nl external ear canals without cough reflex -  Mild bilateral non-specific turbinate edema     NECK :  without JVD/Nodes/TM/ nl carotid upstrokes bilaterally   LUNGS: no acc muscle use,  Mod  barrel  contour chest wall with bilateral  Distant bs s audible wheeze and  without cough on insp or exp maneuver and mod  Hyperresonant  to  percussion bilaterally     CV:  RRR  no s3 or murmur or increase in P2, and no edema   ABD:  soft and nontender with pos mid insp Hoover's  in the supine position. No bruits or organomegaly appreciated, bowel sounds nl  MS:   Nl gait/  ext warm without deformities, calf tenderness, cyanosis or clubbing No obvious joint restrictions   SKIN: warm and dry without lesions    NEURO:  alert, approp, nl sensorium with  no motor or cerebellar deficits apparent.         Assessment & Plan:

## 2018-11-18 ENCOUNTER — Encounter: Payer: Self-pay | Admitting: Internal Medicine

## 2018-11-18 NOTE — Assessment & Plan Note (Addendum)
10/25/2016  try symbicort 160 2bid instead  of breo > preferred breo - 02/27/2017  After extensive coaching HFA effectiveness =    25% with hfa/ 75% with dpi so try back to BREO - PFT's  04/03/2017  FEV1 0.59 (37 % ) ratio 34  p 12  % improvement from saba p BREO  prior to study with DLCO  35/35 % corrects to 49  % for alv volume   - 10/14/2017    > try dulera 100 2bid due to hoarseness on breo   - 02/04/18 >  Added prednisone as Plan D - 02/04/2018 trial of trelegy>  Refractory aecopd 04/03/2018 > changed to symb 160/ spiriva 2.5 and pred 20 until better than taper off as Plan D  - 11/17/2018  After extensive coaching inhaler device,  effectiveness =    90% though Ti still a bit short at baseline     With such good hfa she should be able to maintain at a higher level but needs to follow recs rec  1) pred rx = 20 mg daily until better and need for saba drops back to baseline 2) mucinex dm and flutter valve 3) rx for gerd whenever cough is an issues should be max  4) return ins 6 weeks  with all meds in hand using a trust but verify approach to confirm accurate Medication  Reconciliation The principal here is that until we are certain that the  patients are doing what we've asked, it makes no sense to ask them to do more.    I had an extended discussion with the patient reviewing all relevant studies completed to date and  lasting 15 to 20 minutes of a 25 minute visit    See device teaching which extended face to face time for this visit.  Each maintenance medication was reviewed in detail including emphasizing most importantly the difference between maintenance and prns and under what circumstances the prns are to be triggered using an action plan format that is not reflected in the computer generated alphabetically organized AVS which I have not found useful in most complex patients, especially with respiratory illnesses  Please see AVS for specific instructions unique to this visit that I  personally wrote and verbalized to the the pt in detail and then reviewed with pt  by my nurse highlighting any  changes in therapy recommended at today's visit to their plan of care.

## 2018-11-23 ENCOUNTER — Ambulatory Visit (INDEPENDENT_AMBULATORY_CARE_PROVIDER_SITE_OTHER)
Admit: 2018-11-23 | Discharge: 2018-11-23 | Disposition: A | Discharge status: Home / Self Care | Payer: Medicare Other | Attending: Vascular Surgery | Admitting: Vascular Surgery

## 2018-11-23 ENCOUNTER — Ambulatory Visit (HOSPITAL_COMMUNITY)
Admission: RE | Admit: 2018-11-23 | Discharge: 2018-11-23 | Disposition: A | Discharge status: Home / Self Care | Payer: Medicare Other | Source: Ambulatory Visit | Attending: Vascular Surgery | Admitting: Vascular Surgery

## 2018-11-23 ENCOUNTER — Ambulatory Visit (INDEPENDENT_AMBULATORY_CARE_PROVIDER_SITE_OTHER): Payer: Medicare Other | Admitting: Family

## 2018-11-23 ENCOUNTER — Other Ambulatory Visit: Payer: Self-pay

## 2018-11-23 ENCOUNTER — Encounter: Payer: Self-pay | Admitting: Family

## 2018-11-23 VITALS — BP 133/60 | HR 70 | Temp 97.2°F | Resp 16 | Ht 61.0 in | Wt 115.0 lb

## 2018-11-23 DIAGNOSIS — Z95828 Presence of other vascular implants and grafts: Secondary | ICD-10-CM

## 2018-11-23 DIAGNOSIS — I739 Peripheral vascular disease, unspecified: Secondary | ICD-10-CM | POA: Diagnosis not present

## 2018-11-23 DIAGNOSIS — I70213 Atherosclerosis of native arteries of extremities with intermittent claudication, bilateral legs: Secondary | ICD-10-CM | POA: Insufficient documentation

## 2018-11-23 DIAGNOSIS — Z9862 Peripheral vascular angioplasty status: Secondary | ICD-10-CM

## 2018-11-23 DIAGNOSIS — Z87891 Personal history of nicotine dependence: Secondary | ICD-10-CM

## 2018-11-23 DIAGNOSIS — I779 Disorder of arteries and arterioles, unspecified: Secondary | ICD-10-CM

## 2018-11-23 NOTE — Progress Notes (Signed)
[facility] [place]   CC: Follow up peripheral artery occlusive disease  History of Present Illness Carla Dawson is a 84 y.o. female who is s/p:                         #1. Redo right femoral artery exposure                         #2: Right iliac artery thrombectomy                         #3: Right profunda and femoral tibial bypass graft thrombectomy                         #4: Right external iliac and common femoral endarterectomy with bovine pericardial patch angioplasty                         #5: Placement of Praveena incisional wound VAC On 11-04-18 by Dr. Trula Slade for ischemic right leg.   She no longer has the entire right leg pain after walking about 100 steps. She denies any problems with either leg when walking. Her dyspnea limits her walking.  She states that Dr. Trula Slade told her that she will need to have the left leg bypass inflow opened up at some point.  She is also s/p left CFA endarterectomy and femoral to BK popliteal bypass with PTFE by Dr. Oneida Alar on 04/12/18.This case was done emergently due to acute ischemia of LLE. Surgical history also signficant for R fem-pop bypass.   She states the swelling in her left leg has decreased from what it was, she no longer has a problem putting on her left shoe.   [place]  Past medical history: COPD managed with inhalers, Plavix daily s/p stent placement, and HTN managed with Norvasc. She states she has no history of CAD or DM.   She denies any known history of stroke.  Diabetic: No Tobacco use: former smoker, quit in 2007, smoked x 52years  Pt meds include: Statin : yes Betablocker: No ASA: yes Other anticoagulants/antiplatelets: Plavix     Past Medical History:  Diagnosis Date  . Allergy    "maybe seasonal allergies" (07/08/2017)  . Arthritis    "hands" (07/08/2017)  . Chicken pox   . COPD (chronic obstructive pulmonary disease)  ([place])   . High cholesterol    "took me off pills ~ 1-2 months ago" (07/08/2017)  . History of blood transfusion 2017   "when I had blood clot in my leg"  . Hypertension   . PAD (peripheral artery disease) ([place])    severe/notes 10/14/2016  . Pneumonia ~ 2015  . PVD (peripheral vascular disease) ([place])     Social History Social History   Tobacco Use  . Smoking status: Former Smoker    Packs/day: 1.50    Years: 52.00    Pack years: 78.00    Start date: 12/09/1953    Last attempt to quit: 12/09/2005    Years since quitting: 12.9  . Smokeless tobacco: Never Used  Substance Use Topics  . Alcohol use: No  . Drug use: No    Family History Family History  Problem Relation Age of Onset  . Arthritis Mother   . Hypertension Mother   . Arthritis Father   .  Hypertension Father   . Cancer Brother   . Hypertension Son     Past Surgical History:  Procedure Laterality Date  . ABDOMINAL AORTOGRAM N/A 04/22/2017   Procedure: Abdominal Aortogram;  Surgeon: Serafina Mitchell, MD;  Location: [facility];  Service: Cardiovascular;  Laterality: N/A;  . ABDOMINAL AORTOGRAM W/LOWER EXTREMITY N/A 03/04/2017   Procedure: Abdominal Aortogram w/Lower Extremity;  Surgeon: Serafina Mitchell, MD;  Location: [facility];  Service: Cardiovascular;  Laterality: N/A;  . ABDOMINAL AORTOGRAM W/LOWER EXTREMITY N/A 07/08/2017   Procedure: Abdominal Aortogram w/Lower Extremity;  Surgeon: Serafina Mitchell, MD;  Location: [facility];  Service: Cardiovascular;  Laterality: N/A;  . BIOPSY THYROID    . CATARACT EXTRACTION W/ INTRAOCULAR LENS  IMPLANT, BILATERAL Bilateral   . EMBOLECTOMY Right 04/09/2018   Procedure: RIGHT COMMON FEMORAL ENDARTERECTOMY  WITH BOVINE PATCH ANGIOPLASTY; RIGHT LOWER LEG THROMBOEMBOLECTOMY INTRAOPERATIVE ARTERIOGRAM;  Surgeon: Waynetta Sandy, MD;  Location: [place];  Service: Vascular;  Laterality: Right;  . ENDARTERECTOMY FEMORAL Right 11/04/2018   Procedure:  ENDARTERECTOMY ILIO-FEMORAL ENDARTECTOMY;  Surgeon: Serafina Mitchell, MD;  Location: [facility];  Service: Vascular;  Laterality: Right;  . FEMORAL-POPLITEAL BYPASS GRAFT Left 04/12/2018   Procedure: Vein Patch Angioplasty Below Knee Popliteal Artery; Left Femoral Endarterectomy with Profundaplasty; Left Femoral-Below Knee Popliteal Bypass Graft using a 15mm by 80cm Thin Wall Propaten Gortex Graft;  Surgeon: Elam Dutch, MD;  Location: [place];  Service: Vascular;  Laterality: Left;  . HEMATOMA EVACUATION Right 2017   "S/P procedure; discharged; had to come back for emergency OR"  . LOWER EXTREMITY ANGIOGRAPHY Bilateral 04/22/2017   Procedure: Lower Extremity Angiography;  Surgeon: Serafina Mitchell, MD;  Location: [facility];  Service: Cardiovascular;  Laterality: Bilateral;  . LOWER EXTREMITY ANGIOGRAPHY Bilateral 04/07/2018   Procedure: LOWER EXTREMITY ANGIOGRAPHY;  Surgeon: Serafina Mitchell, MD;  Location: [facility] [place] [facility];  Service: Cardiovascular;  Laterality: Bilateral;  . LOWER EXTREMITY ANGIOGRAPHY N/A 11/03/2018   Procedure: LOWER EXTREMITY ANGIOGRAPHY;  Surgeon: Serafina Mitchell, MD;  Location: [place];  Service: Cardiovascular;  Laterality: N/A;  . PATCH ANGIOPLASTY Right 11/04/2018   Procedure: PATCH ANGIOPLASTY WITH Weyman Pedro;  Surgeon: Serafina Mitchell, MD;  [facility];  Service: Vascular;  Laterality: Right;  . PERIPHERAL VASCULAR BALLOON ANGIOPLASTY Left 03/04/2017   Procedure: Peripheral Vascular Balloon Angioplasty;  Surgeon: Serafina Mitchell, MD;  Location: [place] [facility];  Service: Cardiovascular;  Laterality: Left;  SFA and PT  . PERIPHERAL VASCULAR BALLOON ANGIOPLASTY Right 11/03/2018   Procedure: PERIPHERAL VASCULAR BALLOON ANGIOPLASTY;  Surgeon: Serafina Mitchell, MD;  Location: [place] [facility];  Service: Cardiovascular;  Laterality: Right;  common femoral  . PERIPHERAL VASCULAR INTERVENTION  07/08/2017  . PERIPHERAL VASCULAR INTERVENTION   07/08/2017   Procedure: Peripheral Vascular Intervention;  Surgeon: Serafina Mitchell, MD;  Location: [place] [facility];  Service: Cardiovascular;;  . PERIPHERAL VASCULAR INTERVENTION Left 04/07/2018   Procedure: PERIPHERAL VASCULAR INTERVENTION;  Surgeon: Serafina Mitchell, MD;  Location: [place] [facility];  Service: Cardiovascular;  Laterality: Left;  . THROMBECTOMY ILIAC ARTERY Right 11/04/2018   Procedure: THROMBECTOMY RIGHT ILIO AND FEMORAL-TIBIAL BYPASS;  Surgeon: Serafina Mitchell, MD;  Location: [facility];  Service: Vascular;  Laterality: Right;  Marland Kitchen VAGINAL HYSTERECTOMY    . VEIN BYPASS SURGERY      Allergies  Allergen Reactions  . Lortab [Hydrocodone-Acetaminophen] Shortness Of Breath and Itching  .  Morphine And Related Other (See Comments)    Unknown reaction    Current Outpatient Medications  Medication Sig Dispense Refill  . albuterol (PROVENTIL HFA;VENTOLIN HFA) 108 (90 Base) MCG/ACT inhaler INHALE 2 PUFFS BY MOUTH EVERY 4 HOURS AS NEEDED FOR WHEEZING (Patient taking differently: Inhale 2 puffs into the lungs every 4 (four) hours as needed for wheezing or shortness of breath. ) 8.5 g 0  . albuterol (PROVENTIL) (2.5 MG/3ML) 0.083% nebulizer solution INHALE 1 NEBULE VIA NEBULIZER EVERY 4 HOURS AS NEEDED FOR WHEEZING OR SHORTNESS OF BREATH (Patient taking differently: Take 2.5 mg by nebulization every 4 (four) hours as needed for wheezing or shortness of breath. ) 300 mL 2  . amLODipine (NORVASC) 5 MG tablet Take 1 tablet (5 mg total) by mouth at bedtime. 90 tablet 3  . aspirin EC 81 MG tablet Take 81 mg by mouth daily.    Marland Kitchen atorvastatin (LIPITOR) 10 MG tablet Take 1 tablet (10 mg total) by mouth daily at 6 PM.    . budesonide-formoterol (SYMBICORT) 160-4.5 MCG/ACT inhaler Inhale 2 puffs into the lungs 2 (two) times daily. 1 Inhaler 11  . budesonide-formoterol (SYMBICORT) 160-4.5 MCG/ACT inhaler Inhale 2 puffs into the lungs 2 (two) times daily. 1 Inhaler 0  . cetirizine (ZYRTEC) 10 MG  tablet Take 10 mg by mouth at bedtime.     . clopidogrel (PLAVIX) 75 MG tablet Take 75 mg by mouth at bedtime.     Marland Kitchen dextromethorphan (DELSYM) 30 MG/5ML liquid Take 30 mg by mouth 2 (two) times daily as needed for cough.    . dextromethorphan-guaiFENesin (MUCINEX DM) 30-600 MG 12hr tablet Take 0.5 tablets by mouth 2 (two) times daily.    . famotidine (PEPCID) 20 MG tablet Take 20 mg by mouth at bedtime.    . gabapentin (NEURONTIN) 100 MG capsule Take 1 capsule (100 mg total) by mouth 3 (three) times daily. 90 capsule 2  . Multiple Vitamin (MULTIVITAMIN WITH MINERALS) TABS tablet Take 1 tablet by mouth at bedtime.     Marland Kitchen omeprazole (PRILOSEC) 20 MG capsule Take 20 mg by mouth daily.    . predniSONE (DELTASONE) 10 MG tablet 2 daily until  Bette then one daily x 5 days and stop 100 tablet 0  . SPIRIVA RESPIMAT 2.5 MCG/ACT AERS INHALE 2 PUFFS INTO THE LUNGS DAILY (Patient taking differently: Inhale 2 puffs into the lungs daily. ) 1 Inhaler 11  . traMADol (ULTRAM) 50 MG tablet Take 1 tablet (50 mg total) by mouth every 6 (six) hours as needed for moderate pain. 20 tablet 0   No current facility-administered medications for this visit.     ROS: See HPI for pertinent positives and negatives.   Physical Examination  Vitals:   11/23/18 1211  BP: 133/60  Pulse: 70  Resp: 16  Temp: (!) 97.2 F (36.2 C)  TempSrc: Oral  SpO2: 97%  Weight: 115 lb (52.2 kg)  Height: 5\' 1"  (1.549 m)   Body mass index is 21.73 kg/m.  General: A&O x 3, WDWN, elderly, chronically ill appearing female. Gait: normal HENT: No gross abnormalities.  Eyes: PERRLA. Pulmonary: Respirations are mildly labored at rest, limited air movement in all fields, no rales, rhonchi, or wheezes. Cardiac: regular rhythm, no detected murmur.         Carotid Bruits Right Left   Negative Negative   Radial pulses are 1+ palpable bilaterally   Adominal aortic pulse is not palpable  VASCULAR  EXAM: Extremities without ischemic changes, without Gangrene; without open wounds. There are 2 skin fissures at her left heel, no drainage, no erythema.  Right groin incision is well healed.                                                                                                           LE Pulses Right Left       FEMORAL  3+ palpable  2+ palpable        POPLITEAL  not palpable   not palpable       POSTERIOR TIBIAL  not palpable   not palpable        DORSALIS PEDIS      ANTERIOR TIBIAL 1+ palpable  faintly palpable    Abdomen: soft, NT, no palpable masses. Skin: no rashes, no cellulitis, see Extremities.  Musculoskeletal: no muscle wasting or atrophy. Mild thoracic and cervical kyphosis.  Neurologic: A&O X 3; appropriate affect, Sensation is normal; MOTOR FUNCTION:  moving all extremities equally, motor strength 5/5 throughout. Speech is fluent/normal. CN 2-12 intact. Psychiatric: Thought content is normal, mood appropriate for clinical situation.     ASSESSMENT: Carla Dawson is a 84 y.o. female who is s/p:                         #1. Redo right femoral artery exposure                         #2: Right iliac artery thrombectomy                         #3: Right profunda and femoral tibial bypass graft thrombectomy                         #4: Right external iliac and common femoral endarterectomy with bovine pericardial patch angioplasty                         #5: Placement of Praveena incisional wound VAC On 11-04-18 by Dr. Trula Slade for ischemic right leg.   Pt left leg was studied with duplex today, not the right leg.  Left leg inflow to bypass graft with 508 cm/s velocity.   Her dyspnea secondary to her COPD limits her walking, she no longer has claudication in her right leg; also no claudication in the left leg.    DATA  11-23-18:   Left Graft #1: +--------------------+--------+---------------+----------+--------+            PSV  cm/sStenosis    Waveform Comments  +--------------------+--------+---------------+----------+--------+  Inflow       508   75-99% stenosismonophasic      +--------------------+--------+---------------+----------+--------+  Proximal Anastomosis224           monophasic      +--------------------+--------+---------------+----------+--------+  Proximal Graft   73           monophasic      +--------------------+--------+---------------+----------+--------+  Mid Graft      33           monophasic      +--------------------+--------+---------------+----------+--------+  Distal Graft    27           monophasic      +--------------------+--------+---------------+----------+--------+  Distal Anastamosis 64           monophasic      +--------------------+--------+---------------+----------+--------+  Outflow       82           monophasic      +--------------------+--------+---------------+----------+--------+    Summary:  Left: No evidence of arterial occlusive disease within the bypass graft; however, inflow disease is observed (75-99%).    11-23-18 ABI Findings:  +---------+------------------+-----+----------+--------+  Right  Rt Pressure (mmHg)IndexWaveform Comment   +---------+------------------+-----+----------+--------+  Brachial 168                      +---------+------------------+-----+----------+--------+  PTA   144        0.86 monophasic      +---------+------------------+-----+----------+--------+  DP    156        0.93 biphasic       +---------+------------------+-----+----------+--------+  Great Toe57        0.34            +---------+------------------+-----+----------+--------+     +---------+------------------+-----+--------+-------+  Left   Lt Pressure (mmHg)IndexWaveformComment  +---------+------------------+-----+--------+-------+  Brachial 165                     +---------+------------------+-----+--------+-------+  PTA   127        0.76 biphasic      +---------+------------------+-----+--------+-------+  DP    129        0.77 biphasic      +---------+------------------+-----+--------+-------+  Great Toe61        0.36           +---------+------------------+-----+--------+-------+    +-------+-----------+-----------+------------+------------+  ABI/TBIToday's ABIToday's TBIPrevious ABIPrevious TBI  +-------+-----------+-----------+------------+------------+  Right 0.93    0.34    0.52    0.72      +-------+-----------+-----------+------------+------------+  Left  0.77    0.36    0.86    0.64      +-------+-----------+-----------+------------+------------+    Summary:  Right: Resting right ankle-brachial index indicates mild right lower extremity arterial disease. The right toe-brachial index is abnormal.    Left: Resting left ankle-brachial index indicates moderate left lower extremity arterial disease. The left toe-brachial index is abnormal   Significant improvement in the right LE arterial perfusion, mild disease with mono and biphasic waveforms, compared to the exam on 10-27-18.   Slight decline in the left ABI, moderate disease. With biphasic waveforms.     PLAN:  Based on the patient's vascular studies and examination, pt will return to clinic in 3 months with ABI's and right LE arterial duplex, see Dr. Trula Slade afterward, not NP, as pt indicates that inflow procedure for left LE is contemplated by Dr. Trula Slade.  However, pt daughter is adamant that Dr. Trula Slade told her that pt needs intervention in the left leg  ASAP; therefore will schedule with Dr. Trula Slade on 12-23 or 12-03-18 in office to discuss left LE intervention.  Review of Dr. Trula Slade notes does not mention this, except for "she also has a high-grade stenosis in the left common femoral artery" on his H&P dated 11-04-18.   I discussed in depth with the patient the nature of atherosclerosis, and emphasized the importance of maximal medical management including  strict control of blood pressure, blood glucose, and lipid levels, obtaining regular exercise, and continued cessation of smoking.  The patient is aware that without maximal medical management the underlying atherosclerotic disease process will progress, limiting the benefit of any interventions.  The patient was given information about PAD including signs, symptoms, treatment, what symptoms should prompt the patient to seek immediate medical care, and risk reduction measures to take.  Clemon Chambers, RN, MSN, FNP-C [facility] Phone: [phone]  Clinic MD: Carlis Abbott on call  11/23/18 12:27 PM

## 2018-11-24 ENCOUNTER — Other Ambulatory Visit: Payer: Self-pay

## 2018-11-24 ENCOUNTER — Ambulatory Visit (INDEPENDENT_AMBULATORY_CARE_PROVIDER_SITE_OTHER): Payer: Medicare Other

## 2018-11-24 ENCOUNTER — Other Ambulatory Visit: Payer: Self-pay | Admitting: Internal Medicine

## 2018-11-24 ENCOUNTER — Ambulatory Visit (HOSPITAL_COMMUNITY): Payer: Medicare Other | Attending: Cardiovascular Disease

## 2018-11-24 DIAGNOSIS — I739 Peripheral vascular disease, unspecified: Secondary | ICD-10-CM | POA: Diagnosis not present

## 2018-11-24 DIAGNOSIS — I10 Essential (primary) hypertension: Secondary | ICD-10-CM | POA: Diagnosis not present

## 2018-11-24 DIAGNOSIS — J449 Chronic obstructive pulmonary disease, unspecified: Secondary | ICD-10-CM | POA: Diagnosis not present

## 2018-11-24 DIAGNOSIS — Z87891 Personal history of nicotine dependence: Secondary | ICD-10-CM | POA: Insufficient documentation

## 2018-11-24 DIAGNOSIS — R002 Palpitations: Secondary | ICD-10-CM | POA: Insufficient documentation

## 2018-11-24 DIAGNOSIS — E785 Hyperlipidemia, unspecified: Secondary | ICD-10-CM | POA: Diagnosis not present

## 2018-11-25 ENCOUNTER — Ambulatory Visit (INDEPENDENT_AMBULATORY_CARE_PROVIDER_SITE_OTHER)
Admission: RE | Admit: 2018-11-25 | Discharge: 2018-11-25 | Disposition: A | Discharge status: Home / Self Care | Payer: Medicare Other | Source: Ambulatory Visit | Attending: Internal Medicine | Admitting: Internal Medicine

## 2018-11-25 ENCOUNTER — Ambulatory Visit (INDEPENDENT_AMBULATORY_CARE_PROVIDER_SITE_OTHER): Payer: Medicare Other | Admitting: Internal Medicine

## 2018-11-25 ENCOUNTER — Telehealth: Payer: Self-pay | Admitting: Internal Medicine

## 2018-11-25 ENCOUNTER — Encounter: Payer: Self-pay | Admitting: Internal Medicine

## 2018-11-25 VITALS — BP 112/50 | HR 91 | Ht 61.0 in | Wt 112.0 lb

## 2018-11-25 DIAGNOSIS — R0602 Shortness of breath: Secondary | ICD-10-CM | POA: Diagnosis not present

## 2018-11-25 DIAGNOSIS — J449 Chronic obstructive pulmonary disease, unspecified: Secondary | ICD-10-CM

## 2018-11-25 DIAGNOSIS — D638 Anemia in other chronic diseases classified elsewhere: Secondary | ICD-10-CM | POA: Diagnosis not present

## 2018-11-25 DIAGNOSIS — R05 Cough: Secondary | ICD-10-CM | POA: Diagnosis not present

## 2018-11-25 DIAGNOSIS — I779 Disorder of arteries and arterioles, unspecified: Secondary | ICD-10-CM

## 2018-11-25 DIAGNOSIS — R0609 Other forms of dyspnea: Secondary | ICD-10-CM | POA: Insufficient documentation

## 2018-11-25 LAB — CBC WITH DIFFERENTIAL/PLATELET
Basophils Absolute: 0 10*3/uL (ref 0.0–0.1)
Basophils Relative: 0.1 % (ref 0.0–3.0)
Eosinophils Absolute: 0 10*3/uL (ref 0.0–0.7)
Eosinophils Relative: 0 % (ref 0.0–5.0)
HCT: 29.7 % — ABNORMAL LOW (ref 36.0–46.0)
Hemoglobin: 9.8 g/dL — ABNORMAL LOW (ref 12.0–15.0)
Lymphocytes Relative: 3.4 % — ABNORMAL LOW (ref 12.0–46.0)
Lymphs Abs: 0.6 10*3/uL — ABNORMAL LOW (ref 0.7–4.0)
MCHC: 32.8 g/dL (ref 30.0–36.0)
MCV: 85.9 fl (ref 78.0–100.0)
Monocytes Absolute: 0.7 10*3/uL (ref 0.1–1.0)
Monocytes Relative: 4.2 % (ref 3.0–12.0)
Neutro Abs: 15.7 10*3/uL — ABNORMAL HIGH (ref 1.4–7.7)
Neutrophils Relative %: 92.3 % — ABNORMAL HIGH (ref 43.0–77.0)
Platelets: 416 10*3/uL — ABNORMAL HIGH (ref 150.0–400.0)
RBC: 3.46 Mil/uL — ABNORMAL LOW (ref 3.87–5.11)
RDW: 15.1 % (ref 11.5–15.5)
WBC: 17 10*3/uL — ABNORMAL HIGH (ref 4.0–10.5)

## 2018-11-25 LAB — BRAIN NATRIURETIC PEPTIDE: Pro B Natriuretic peptide (BNP): 282 pg/mL — ABNORMAL HIGH (ref 0.0–100.0)

## 2018-11-25 NOTE — Progress Notes (Addendum)
Subjective:   Patient ID: Carla Dawson, female    DOB: 1931/06/18,    MRN: [id number]     Brief patient profile:  62 yowf  Quit smoking 2007 with dx of copd in 2002 while living in [place] rx with Advair then Breo since quit smoking referred to pulmonary clinic 10/25/2016 by Dr   Betty Martinique   History of Present Illness  10/25/2016 1st [facility] office visit/ Carla Dawson   Chief Complaint  Patient presents with  . Pulmonary Consult    Referred by Dr. Betty Martinique.  Pt here to est care for COPD. She c/o SOB with walking approx 800 ft on a flat surface, eating and walking up an incline. She has some PND but not coughing much.    MMRC2 = can't walk a nl pace on a flat grade s sob but does fine slow and flat eg walking up to 800 ft - she measures the distances herself to keep tack as also limited by claudication - rarely feels need for saba  rec Work on inhaler technique:  relax and gently blow all the way out then take a nice smooth deep breath back in, triggering the inhaler at same time you start breathing in.  Hold for up to 5 seconds if you can. Blow out thru nose. Rinse and gargle with water when done Plan A = Automatic = Symbicort 160 Take 2 puffs first thing in am and then another 2 puffs about 12 hours later.  Plan B = Backup Only use your albuterol (proair) as a rescue medication Plan C = Crisis - only use your albuterol nebulizer if you first try Plan B and it fails to help > ok to use the nebulizer up to every 4 hours but if start needing it regularly call for immediate appointment   02/27/2017  f/u ov/Carla Dawson re:  Copd / 02 prn maint on advair 250 bid and prn saba  Chief Complaint  Patient presents with  . Follow-up    Pt states was hospitalized while in [place] early March 2018. She states she was told that she may have had the beginning of PNA. She states today her breathing is "great". She has occ non prod cough. She was sent home with o2, but has  not used in the past several days.   breathing worse suddenly while in [place] not responsive  To albuterol or 2lpm > admit x 4 days and back to baseline  MMRC2 = can't walk a nl pace on a flat grade s sob but does fine slow and flat  rec Plan A = Automatic = Breo one click each am > take two good deep drags off it but click it just once  Plan B = Backup Only use your albuterol (PROAIR) as a rescue medication Plan C = Crisis - only use your albuterol nebulizer if you first try Plan B         10/14/2017  f/u ov/Carla Dawson re:   GOLD III / maint on BREO daily / has 02 / not using  Chief Complaint  Patient presents with  . Follow-up    Pt plans to go to [place] soon for a month and wants to discuss plan to "keep from having to take prednisone".  She states she is using her proair rarely, but has been using her neb recently due to chest tightness.   doe =  MMRC3 = can't walk 100 yards even at a slow pace at  a flat grade s stopping due to sob  Can do HT but not sam's   Very hoarse on breo 100/ poor hfa  Sleeps ok  rec Plan A = Automatic = dulera 100 Take 2 puffs first thing in am and then another 2 puffs about 12 hours later.  Work on inhaler technique: Plan B = Backup Only use your albuterol [employer])  as a rescue medication  Plan C = Crisis - only use your albuterol nebulizer if you first try Plan B and it fails to help > ok to use the nebulizer up to every 4 hours but if start needing it regularly call for immediate appointment Plan D = Deltasone take 6 days if you are having to use plan c and not working great Plan E  = ER, go there if all elso fails      02/04/2018  f/u ov/Carla Dawson re:  COPD III /one flare in Dec 2018 / BREO maint but req one course pred in last 3 m for aecopd  Chief Complaint  Patient presents with  . Follow-up    Last night she was eating and aspirated hamburger. She states that she feels pain in her throat when she takes a deep breath.  Her breathing is unchanged. She  is using her proair 3-4 x per wk on average and rarely uses neb.   Dyspnea:  MMRC3 = can't walk 100 yards even at a slow pace at a flat grade s stopping due to sob   Cough: not normally/  Sleep: ok/ no 02  SABA use:   No more than once a day/ maybe p shower  rec Plan A = Automatic = Breo or Trelegy daily  Work on inhaler technique:   Plan B = Backup Only use your albuterol (Proair)  as a rescue medication  Plan C = Crisis - only use your albuterol nebulizer if you first try Plan B Plan D = Deltasone take 6 days if you are having to use plan c and not working great Plan E  = ER, go there if all elso fails        03/23/2018 acute extended ov/Carla Dawson re:  aecopd x 3 days/ did not start Plan D/ tol neb poorly due to tremors Chief Complaint  Patient presents with  . Acute Visit    productive cough yellow mucus, sob ,cough since 4/12   was doing about the same prior to flare 03/20/18 with cough/ congestion/ thick mucus / no fever c/p and comfortable at rest but trouble sleeping due to cough / chest tight  rec zpak  Take your refillable prednisone and remember it is plan = D = Prednisone 10 mg take  4 each am x 2 days,   2 each am x 2 days,  1 each am x 2 days and stop.       04/03/2018 acute extended ov/Carla Dawson re:  aecopd  Chief Complaint  Patient presents with  . Acute Visit    Improved slightly after visit 03/23/18 and then worsened again 2-3 days ago- cough with light yellow sputum and increased SOB. She is using her albuterol inhaler 4-5 x per day and neb at least 3 x per day.    on trelegy p zpak / prednisone was only a little  better as it turns out = ok only at rest whereas baseline = walking house x 500 steps at a time s stopping - did not less saba need p prednisone but concerned getting worse  x 3 d prior to OV  And needs vascular sugery 04/07/18 and doesn't want to be turned down  Mucus turned clear on zpak, then slt yellow and thick since despite rx with mucinex rec Depomedrol 120  mg today  Plan A = Automatic = stop trelegy for now and start symbicort 160 x 2 puffs then spiriva 2 pffs and 12 hours later just the symbicort 160 x 2 pffs Work on inhaler technique:  Plan B = Backup Only use your albuterol as a rescue medication Plan C = Crisis - only use your albuterol nebulizer if you first try Plan B and it fails to help > ok to use the nebulizer up to every 4 hours but if start needing it regularly call for immediate appointment Plan D = Deltasone = prednisone 10 mg 2 daily until all better then 1 daily x 3 days and stop  For cough mucinex dm 1200 mg every 12 hours  Try prilosec otc 20mg   Take 30-60 min before first meal of the day and Pepcid ac (famotidine) 20 mg one @  bedtime until cough is completely gone for at least a week without the need for cough suppression Return here when you come back for [place]     06/29/2018 acute  ov/Carla Dawson re: sob/cough c/w aecopd in pt with GOLD III criteria  Chief Complaint  Patient presents with  . Acute Visit    increased SOB, cough with yellow sputum and wheezing for the past 5-6 days. She has started using her neb with albuterol 3 x daily.   did fine in June and most of Carla 2019 on Symb / spiriva then acutely ill x about a week and did not follow written action plan but started using neb as above with some relief and typically comfortable at rest p neb but doe x across the room assoc the purulent sputum,subj wheezing worse at hs rec Depomedrol 120 mg today  Zpak  Plan A = Automatic = continue  symbicort 160 x 2 puffs then spiriva 2 pffs and 12 hours later just the symbicort 160 x 2 pffs Work on inhaler technique:  relax and gently blow all the way out then take a nice smooth deep breath back in, triggering the inhaler at same time you start breathing in.  Hold for up to 5 seconds if you can. Blow out thru nose. Rinse and gargle with water when done Plan B = Backup Only use your albuterol as a rescue medication  Plan C =  Crisis - only use your albuterol nebulizer if you first try Plan B and it fails to help > ok to use the nebulizer up to every 4 hours but if start needing it regularly call for immediate appointment Plan D = Deltasone = prednisone 10 mg 2 daily until all better then 1 daily x 5 days and stop  For cough mucinex dm 1200 mg every 12 hours with the flutter valve Try prilosec otc 20mg   Take 30-60 min before first meal of the day and Pepcid ac (famotidine) 20 mg one @  bedtime until cough is completely gone for at least a week without the need for cough suppression        11/17/2018  f/u ov/Carla Dawson re: GOLD III  Chief Complaint  Patient presents with  . Follow-up    breathing worse since 11/04/18 when she had procedure done on her leg.  She is using her albuterol inhaler once daily on average and neb 3 x  daily.   Dyspnea:  50 ft and limited by  R leg claudication f/u [place] Cough: white mucus worse in am / thick  Sleeping: 30 degrees SABA use: praoair not working / neb needed and on pred taper though not using the way it was last rec = 2 until better then 1 daily x 5 days and stop   02: none rec Plan A = Automatic = continue  symbicort 160 x 2 puffs then spiriva 2 pffs and 12 hours later just the symbicort 160 x 2 pffs Plan B = Backup Only use your albuterol as a rescue medication Plan C = Crisis - only use your albuterol nebulizer if you first try Plan B and it fails to help > ok to use the nebulizer up to every 4 hours but if start needing it regularly call for immediate appointment Plan D = Deltasone = prednisone 10 mg 2 daily until all better then 1 daily x 5 days and stop  For cough mucinex dm 1200 mg every 12 hours with the flutter valve Try prilosec otc 20mg   Take 30-60 min before first meal of the day and Pepcid ac (famotidine) 20 mg one @  bedtime until cough is completely gone for at least a week without the need for cough suppression Please schedule a follow up office visit in 6   weeks, call sooner if needed with all medications /inhalers/ solutions    11/25/2018 acute extended ov/Carla Dawson re: aecopd  Still on prednisone 20 mg daily  Since 11/18/18 Chief Complaint  Patient presents with  . Acute Visit    Increased SOB, rattling in chest and anxiety since the last visit on 11/17/18. She is using her proair 1-2 x daily and is using her neb 3 x daily.    sleeps at 30 degrees ok until night prior to ov now needing noct saba  No cough / no cp   No obvious day to day or daytime variability or assoc excess/ purulent sputum or mucus plugs or hemoptysis or cp or chest tightness, subjective wheeze or overt sinus or hb symptoms.     Also denies any obvious fluctuation of symptoms with weather or environmental changes or other aggravating or alleviating factors except as outlined above   No unusual exposure hx or h/o childhood pna/ asthma or knowledge of premature birth.  Current Allergies, Complete Past Medical History, Past Surgical History, Family History, and Social History were reviewed in [employer] record.  ROS  The following are not active complaints unless bolded Hoarseness, sore throat, dysphagia, dental problems, itching, sneezing,  nasal congestion or discharge of excess mucus or purulent secretions, ear ache,   fever, chills, sweats, unintended wt loss or wt gain, classically pleuritic or exertional cp,  orthopnea pnd or arm/hand swelling  or leg swelling, presyncope, palpitations, abdominal pain, anorexia, nausea, vomiting, diarrhea  or change in bowel habits or change in bladder habits, change in stools or change in urine, dysuria, hematuria,  rash, arthralgias, visual complaints, headache, numbness, weakness or ataxia or problems with walking or coordination,  change in mood or  memory.        Current Meds  Medication Sig  . albuterol (PROAIR HFA) 108 (90 Base) MCG/ACT inhaler Inhale 2 puffs into the lungs every 4 (four) hours as needed for  wheezing or shortness of breath.  Marland Kitchen albuterol (PROVENTIL) (2.5 MG/3ML) 0.083% nebulizer solution INHALE 1 NEBULE VIA NEBULIZER EVERY 4 HOURS AS NEEDED FOR WHEEZING OR SHORTNESS OF BREATH (  Patient taking differently: Take 2.5 mg by nebulization every 4 (four) hours as needed for wheezing or shortness of breath. )  . amLODipine (NORVASC) 5 MG tablet Take 1 tablet (5 mg total) by mouth at bedtime.  Marland Kitchen aspirin EC 81 MG tablet Take 81 mg by mouth daily.  Marland Kitchen atorvastatin (LIPITOR) 10 MG tablet Take 1 tablet (10 mg total) by mouth daily at 6 PM.  . budesonide-formoterol (SYMBICORT) 160-4.5 MCG/ACT inhaler Inhale 2 puffs into the lungs 2 (two) times daily.  . cetirizine (ZYRTEC) 10 MG tablet Take 10 mg by mouth at bedtime.   . clopidogrel (PLAVIX) 75 MG tablet Take 75 mg by mouth at bedtime.   Marland Kitchen dextromethorphan (DELSYM) 30 MG/5ML liquid Take 30 mg by mouth 2 (two) times daily as needed for cough.  . dextromethorphan-guaiFENesin (MUCINEX DM) 30-600 MG 12hr tablet Take 0.5 tablets by mouth 2 (two) times daily.  . famotidine (PEPCID) 20 MG tablet Take 20 mg by mouth at bedtime.  . gabapentin (NEURONTIN) 100 MG capsule Take 1 capsule (100 mg total) by mouth 3 (three) times daily.  . Multiple Vitamin (MULTIVITAMIN WITH MINERALS) TABS tablet Take 1 tablet by mouth at bedtime.   Marland Kitchen omeprazole (PRILOSEC) 20 MG capsule Take 20 mg by mouth daily.  . predniSONE (DELTASONE) 10 MG tablet 2 daily until  Bette then one daily x 5 days and stop  . SPIRIVA RESPIMAT 2.5 MCG/ACT AERS INHALE 2 PUFFS INTO THE LUNGS DAILY (Patient taking differently: Inhale 2 puffs into the lungs daily. )  . traMADol (ULTRAM) 50 MG tablet Take 1 tablet (50 mg total) by mouth every 6 (six) hours as needed for moderate pain.                 Objective:   Physical Exam  amb wf nad  11/25/2018   112  11/17/2018    115  07/29/2018      112  06/29/2018      113  04/03/2018       110  03/23/2018       111  02/04/2018       109  10/14/2017        108  04/03/2017       100   02/27/2017      96  10/25/16 92 lb 9.6 oz (42 kg)  10/15/16 93 lb 9 oz (42.4 kg)  10/14/16 93 lb (42.2 kg)      Vital signs reviewed - Note on arrival 02 sats  94% on RA       HEENT: Full dentures, nl oropharynx. Nl external ear canals without cough reflex -  Mild bilateral non-specific turbinate edema     NECK :  without JVD/Nodes/TM/ nl carotid upstrokes bilaterally   LUNGS: no acc muscle use,  Mild barrel  contour chest wall with bilateral  Distant bs s audible wheeze and  without cough on insp or exp maneuver and mild  Hyperresonant  to  percussion bilaterally     CV:  RRR  no s3 or murmur or increase in P2, and no edema   ABD:  soft and nontender with pos late  insp Hoover's  in the supine position. No bruits or organomegaly appreciated, bowel sounds nl  MS:   Nl gait/  ext warm without deformities, calf tenderness, cyanosis or clubbing No obvious joint restrictions   SKIN: warm and dry without lesions    NEURO:  alert, approp, nl sensorium with  no motor or cerebellar  deficits apparent.        Labs ordered/ reviewed:      Chemistry      Component Value Date/Time   NA 138 11/25/2018 1522   NA 141 04/23/2013   K 4.0 11/25/2018 1522   CL 104 11/25/2018 1522   CO2 27 11/25/2018 1522   BUN 13 11/25/2018 1522   BUN 13 04/23/2013   CREATININE 0.82 11/25/2018 1522   CREATININE 0.76 06/20/2017 1534   GLU 77 04/23/2013      Component Value Date/Time   CALCIUM 9.1 11/25/2018 1522   ALKPHOS 62 11/04/2018 1159   AST 24 11/04/2018 1159   ALT 11 11/04/2018 1159   BILITOT 0.7 11/04/2018 1159        Lab Results  Component Value Date   WBC 17.0 (H) 11/25/2018   HGB 9.8 (L) 11/25/2018   HCT 29.7 (L) 11/25/2018   MCV 85.9 11/25/2018   PLT 416.0 (H) 11/25/2018       Lab Results  Component Value Date   TSH 0.68 11/25/2018     Lab Results  Component Value Date   PROBNP 282.0 (H) 11/25/2018                   Assessment  & Plan:

## 2018-11-25 NOTE — Patient Instructions (Addendum)
Please remember to go to the lab department   for your tests - we will call you with the results when they are available.      Please remember to go to the  x-ray department at the [facility] building (directly across from [facility])  @ [place]  in the basement  for your tests - we will call you with the results when they are available   amlopidine 5 mg take one half each am   Keep prior appt - call sooner if needed   - late add recheck cbc with fe studies/ consider change norvasc to cardizem next ov

## 2018-11-25 NOTE — Telephone Encounter (Signed)
Spoke with the pt's daughter  After last visit pt's breathing got worse and she is coughing more and "rattling" She has tried her albuterol inhaler, neb, pred 10 mg and is not improving  OV with MW at 2:30  Advised to bring all meds

## 2018-11-26 LAB — BASIC METABOLIC PANEL
BUN: 13 mg/dL (ref 6–23)
CO2: 27 mEq/L (ref 19–32)
Calcium: 9.1 mg/dL (ref 8.4–10.5)
Chloride: 104 mEq/L (ref 96–112)
Creatinine, Ser: 0.82 mg/dL (ref 0.40–1.20)
GFR: 70.42 mL/min (ref >60.00)
Glucose, Bld: 136 mg/dL — ABNORMAL HIGH (ref 70–99)
Potassium: 4 mEq/L (ref 3.5–5.1)
Sodium: 138 mEq/L (ref 135–145)

## 2018-11-26 LAB — TSH: TSH: 0.68 u[IU]/mL (ref 0.35–4.50)

## 2018-11-26 NOTE — Progress Notes (Signed)
Spoke with pt and notified of results per Dr. Wert. Pt verbalized understanding and denied any questions. 

## 2018-11-27 ENCOUNTER — Ambulatory Visit: Payer: Medicare Other | Admitting: Internal Medicine

## 2018-11-27 ENCOUNTER — Encounter: Payer: Self-pay | Admitting: Internal Medicine

## 2018-11-27 DIAGNOSIS — D638 Anemia in other chronic diseases classified elsewhere: Secondary | ICD-10-CM | POA: Insufficient documentation

## 2018-11-27 NOTE — Progress Notes (Signed)
Spoke with pt and notified of results per Dr. Wert. Pt verbalized understanding and denied any questions. 

## 2018-11-27 NOTE — Assessment & Plan Note (Signed)
Echo 11/25/18 Left ventricle: The cavity size was normal. Wall thickness was   normal. Systolic function was vigorous. The estimated ejection   fraction was in the range of 65% to 70%. Wall motion was normal;   there were no regional wall motion abnormalities. Doppler   parameters are consistent with abnormal left ventricular   relaxation (grade 1 diastolic dysfunction). - Pericardium, extracardiac: A trivial pericardial effusion was   identified.     Lab Results  Component Value Date   HGB 9.8 (L) 11/25/2018   HGB 9.9 (L) 11/05/2018   HGB 11.7 (L) 11/04/2018    Carla Dawson is anemic and her bnp is up a bit and Carla Dawson could have diastolic dysfunction causing some of her orthopnea and ? If having tachycardia from use of norvasc in this setting so rec lower amlodipine to 2.5 mg daily and consider changing to cardizem at next ov

## 2018-11-27 NOTE — Assessment & Plan Note (Addendum)
  Lab Results  Component Value Date   HGB 9.8 (L) 11/25/2018   HGB 9.9 (L) 11/05/2018   HGB 11.7 (L) 11/04/2018     Trending down slowly / normocytic with recent vascular procedure with bleeding into wound reported at ER eval 12/06/18 so had an acute blood loss component she should be recovering from if not chronically ill  - will monitor and recheck with Fe levels next ov     I had an extended discussion with the patient and daughter reviewing all relevant studies completed to date and  lasting 15 to 20 minutes of a 25 minute acute visit    See device teaching which extended face to face time for this visit.  Each maintenance medication was reviewed in detail including emphasizing most importantly the difference between maintenance and prns and under what circumstances the prns are to be triggered using an action plan format that is not reflected in the computer generated alphabetically organized AVS which I have not found useful in most complex patients, especially with respiratory illnesses  Please see AVS for specific instructions unique to this visit that I personally wrote and verbalized to the the pt in detail and then reviewed with pt  by my nurse highlighting any  changes in therapy recommended at today's visit to their plan of care.

## 2018-11-27 NOTE — Assessment & Plan Note (Signed)
10/25/2016  try symbicort 160 2bid instead  of breo > preferred breo - 02/27/2017  After extensive coaching HFA effectiveness =    25% with hfa/ 75% with dpi so try back to BREO - PFT's  04/03/2017  FEV1 0.59 (37 % ) ratio 34  p 12  % improvement from saba p BREO  prior to study with DLCO  35/35 % corrects to 49  % for alv volume   - 10/14/2017    > try dulera 100 2bid due to hoarseness on breo   - 02/04/18 >  Added prednisone as Plan D - 02/04/2018 trial of trelegy>  Refractory aecopd 04/03/2018 > changed to symb 160/ spiriva 2.5 and pred 20 until better than taper off as Plan D   - 11/25/2018  After extensive coaching inhaler device,  effectiveness =    75% (short Ti)  Not clear that her present symptoms represent aecopd at this point as could be all anxiety; however, dyspnea and anxiety are chicken and egg issues with pts with copd due to worse airtapping with copd leading to worse anxiety which raises RR and causes worse air trapping  No need for change at this time however with ABC plan as outlined last ov with pred as Plan D

## 2018-11-30 ENCOUNTER — Encounter: Payer: Self-pay | Admitting: *Deleted

## 2018-11-30 ENCOUNTER — Encounter: Payer: Self-pay | Admitting: Surgery

## 2018-11-30 ENCOUNTER — Other Ambulatory Visit: Payer: Self-pay | Admitting: *Deleted

## 2018-11-30 ENCOUNTER — Ambulatory Visit (INDEPENDENT_AMBULATORY_CARE_PROVIDER_SITE_OTHER): Payer: Medicare Other | Admitting: Surgery

## 2018-11-30 ENCOUNTER — Telehealth: Payer: Self-pay | Admitting: *Deleted

## 2018-11-30 VITALS — BP 141/67 | HR 77 | Temp 97.0°F | Resp 16 | Ht 61.0 in | Wt 112.0 lb

## 2018-11-30 DIAGNOSIS — I779 Disorder of arteries and arterioles, unspecified: Secondary | ICD-10-CM

## 2018-11-30 DIAGNOSIS — I70213 Atherosclerosis of native arteries of extremities with intermittent claudication, bilateral legs: Secondary | ICD-10-CM

## 2018-11-30 NOTE — Telephone Encounter (Signed)
Request for Surgical Clearance  1. What type of surgery is being performed?  REDO LEFT FEMORAL ENCARTERECTOMY    2. When is this surgery scheduled?   3. What type of clearance is required (medical clearance vs. Pharmacy clearance to hold med vs. Both)? CARDIAC CLEARANCE    4. Are there any medications that need to be held prior to surgery and how long?      5. Practice name and name of physician performing surgery?   DR. Trula Slade  [facility]    6.  What is your office phone number? [phone]    7. What is your office fax number? (Be sure to include anyone who it needs to go Attn to) [phone] [email or web address]. [email or web address]    8. Anesthesia type (None, local, MAC, general)? GENERAL    REMINDER TO USER: Remember to please route this message to P CV DIV PREOP in a phone note.

## 2018-11-30 NOTE — Progress Notes (Signed)
[facility] Specialist of [place]  Patient name: Carla Dawson MRN: [id number] DOB: 06/19/1931 Sex: female   REASON FOR VISIT:    Follow up  [place]:    This is an 84 year old female whom I met in 2017 for peripheral vascular disease. Previously, [place] state, she was dealing with bilateral rest pain. She underwent percutaneous revascularization of her right leg which was complicated by acute occlusion as well as perforation which required cover stenting. This ultimately led to surgical revascularization which appears to be a right femoral to tibioperoneal trunk bypass graft. On the left leg, the patient has undergone multiple percutaneous interventions including atherectomy and angioplasty of the superficial femoral and popliteal artery as well as angioplasty of anterior tibial artery.On 04-12-2018, she underwent emergent left femoral to popliteal bypass with gortex with VPA of the CFA and Popliteal.  She developed worsening claudication symptoms and on 11-03-2018 she had angioplasty of a CFA lesion.  She had a dissection and went to the OR the next day for occlusion where endarterectomy was performed.  She has a known stenosis in her left groin and is now having wound issues on her left heel  She continues to be medically managed for hypertension.  She takes a statin for hypercholesterolemia.  She is on dual anti-platelet therapy   PAST MEDICAL HISTORY:   Past Medical History:  Diagnosis Date  . Allergy    "maybe seasonal allergies" (07/08/2017)  . Arthritis    "hands" (07/08/2017)  . Chicken pox   . COPD (chronic obstructive pulmonary disease) ([place])   . High cholesterol    "took me off pills ~ 1-2 months ago" (07/08/2017)  . History of blood transfusion 2017   "when I had blood clot in my leg"  . Hypertension   . PAD (peripheral artery disease) ([place])    severe/notes 10/14/2016  . Pneumonia ~ 2015  . PVD  (peripheral vascular disease) ([place])      FAMILY HISTORY:   Family History  Problem Relation Age of Onset  . Arthritis Mother   . Hypertension Mother   . Arthritis Father   . Hypertension Father   . Cancer Brother   . Hypertension Son     SOCIAL HISTORY:   Social History   Tobacco Use  . Smoking status: Former Smoker    Packs/day: 1.50    Years: 52.00    Pack years: 78.00    Start date: 12/09/1953    Last attempt to quit: 12/09/2005    Years since quitting: 12.9  . Smokeless tobacco: Never Used  Substance Use Topics  . Alcohol use: No     ALLERGIES:   Allergies  Allergen Reactions  . Lortab [Hydrocodone-Acetaminophen] Shortness Of Breath and Itching  . Morphine And Related Other (See Comments)    Unknown reaction     CURRENT MEDICATIONS:   Current Outpatient Medications  Medication Sig Dispense Refill  . albuterol (PROAIR HFA) 108 (90 Base) MCG/ACT inhaler Inhale 2 puffs into the lungs every 4 (four) hours as needed for wheezing or shortness of breath. 1 Inhaler 2  . albuterol (PROVENTIL) (2.5 MG/3ML) 0.083% nebulizer solution INHALE 1 NEBULE VIA NEBULIZER EVERY 4 HOURS AS NEEDED FOR WHEEZING OR SHORTNESS OF BREATH (Patient taking differently: Take 2.5 mg by nebulization every 4 (four) hours as needed for wheezing or shortness of breath. ) 300 mL 2  . amLODipine (NORVASC) 5 MG tablet Take 1 tablet (5 mg total) by mouth at bedtime. (Patient  taking differently: Take 5 mg by mouth at bedtime. Taking 1/2 tablet daily) 90 tablet 3  . aspirin EC 81 MG tablet Take 81 mg by mouth daily.    Marland Kitchen atorvastatin (LIPITOR) 10 MG tablet Take 1 tablet (10 mg total) by mouth daily at 6 PM.    . budesonide-formoterol (SYMBICORT) 160-4.5 MCG/ACT inhaler Inhale 2 puffs into the lungs 2 (two) times daily. 1 Inhaler 0  . cetirizine (ZYRTEC) 10 MG tablet Take 10 mg by mouth at bedtime.     . clopidogrel (PLAVIX) 75 MG tablet Take 75 mg by mouth at bedtime.     Marland Kitchen dextromethorphan (DELSYM) 30  MG/5ML liquid Take 30 mg by mouth 2 (two) times daily as needed for cough.    . dextromethorphan-guaiFENesin (MUCINEX DM) 30-600 MG 12hr tablet Take 0.5 tablets by mouth 2 (two) times daily.    . famotidine (PEPCID) 20 MG tablet Take 20 mg by mouth at bedtime.    . gabapentin (NEURONTIN) 100 MG capsule Take 1 capsule (100 mg total) by mouth 3 (three) times daily. 90 capsule 2  . Multiple Vitamin (MULTIVITAMIN WITH MINERALS) TABS tablet Take 1 tablet by mouth at bedtime.     Marland Kitchen omeprazole (PRILOSEC) 20 MG capsule Take 20 mg by mouth daily.    . predniSONE (DELTASONE) 10 MG tablet 2 daily until  Bette then one daily x 5 days and stop 100 tablet 0  . SPIRIVA RESPIMAT 2.5 MCG/ACT AERS INHALE 2 PUFFS INTO THE LUNGS DAILY (Patient taking differently: Inhale 2 puffs into the lungs daily. ) 1 Inhaler 11  . traMADol (ULTRAM) 50 MG tablet Take 1 tablet (50 mg total) by mouth every 6 (six) hours as needed for moderate pain. 20 tablet 0   No current facility-administered medications for this visit.     REVIEW OF SYSTEMS:   [X]  denotes positive finding, [ ]  denotes negative finding Cardiac  Comments:  Chest pain or chest pressure:    Shortness of breath upon exertion:    Short of breath when lying flat:    Irregular heart rhythm:        Vascular    Pain in calf, thigh, or hip brought on by ambulation:    Pain in feet at night that wakes you up from your sleep:     Blood clot in your veins:    Leg swelling:         Pulmonary    Oxygen at home:    Productive cough:     Wheezing:         Neurologic    Sudden weakness in arms or legs:     Sudden numbness in arms or legs:     Sudden onset of difficulty speaking or slurred speech:    Temporary loss of vision in one eye:     Problems with dizziness:         Gastrointestinal    Blood in stool:     Vomited blood:         Genitourinary    Burning when urinating:     Blood in urine:        Psychiatric    Major depression:           Hematologic    Bleeding problems:    Problems with blood clotting too easily:        Skin    Rashes or ulcers:        Constitutional    Fever or chills:  PHYSICAL EXAM:   Vitals:   11/30/18 0918  BP: (!) 141/67  Pulse: 77  Resp: 16  Temp: (!) 97 F (36.1 C)  SpO2: 96%  Weight: 112 lb (50.8 kg)  Height: 5' 1"  (1.549 m)    GENERAL: The patient is a well-nourished female, in no acute distress. The vital signs are documented above. CARDIAC: There is a regular rate and rhythm.  VASCULAR: non palpable pedal pulses PULMONARY: Non-labored respirations ABDOMEN: Soft and non-tender with normal pitched bowel sounds. Right groin incision healing nicely MUSCULOSKELETAL: There are no major deformities or cyanosis. NEUROLOGIC: No focal weakness or paresthesias are detected. SKIN: There are no ulcers or rashes noted. PSYCHIATRIC: The patient has a normal affect.  STUDIES:   Elevated velocities on left groin  MEDICAL ISSUES:   #1:  Post-op follow up of right femoral endarterecomy:  inciiosn healing nicely.  Will repeat u/s in 3 months  #2:  New problem:  Left heel wound with recent finding of left femoral stenosis: At her last arteriogram, a significant left femoral stenosis was identified.  Because the patient has had limited durability with percutaneous intervention, we have discussed proceeding with left femoral endarterectomy and revision of her patch angioplasty.  This will be scheduled for January 22.  She will stop her Plavix 5 days prior to her procedure.  The risks and benefits of procedure discussed with patient all questions were answered.    [name], MD [facility] [phone] Pager [phone]

## 2018-11-30 NOTE — Progress Notes (Signed)
Phone call to daughter and reminded to hold PLAVIX for 5 days prior to surgery.

## 2018-12-01 NOTE — Telephone Encounter (Signed)
Routing to Dr. Harrell Gave - recently seen. Echo looks ok. Monitor in process.   Dr. Harrell Gave - ok to clear without monitor results or would you prefer waiting?  Please route response back to [facility] [place], RN, [place] [address] [place], [place]  [phone]

## 2018-12-06 ENCOUNTER — Other Ambulatory Visit: Payer: Self-pay

## 2018-12-06 ENCOUNTER — Inpatient Hospital Stay (HOSPITAL_COMMUNITY)
Admission: EM | Admit: 2018-12-06 | Discharge: 2018-12-11 | DRG: 190 | Disposition: A | Discharge status: Home Health | Payer: Medicare Other | Attending: Internal Medicine | Admitting: Internal Medicine

## 2018-12-06 ENCOUNTER — Emergency Department (HOSPITAL_COMMUNITY): Payer: Medicare Other

## 2018-12-06 ENCOUNTER — Encounter (HOSPITAL_COMMUNITY): Payer: Self-pay | Admitting: Surgery

## 2018-12-06 DIAGNOSIS — D638 Anemia in other chronic diseases classified elsewhere: Secondary | ICD-10-CM | POA: Diagnosis not present

## 2018-12-06 DIAGNOSIS — R0902 Hypoxemia: Secondary | ICD-10-CM | POA: Diagnosis not present

## 2018-12-06 DIAGNOSIS — Z7982 Long term (current) use of aspirin: Secondary | ICD-10-CM

## 2018-12-06 DIAGNOSIS — E785 Hyperlipidemia, unspecified: Secondary | ICD-10-CM | POA: Diagnosis present

## 2018-12-06 DIAGNOSIS — J441 Chronic obstructive pulmonary disease with (acute) exacerbation: Secondary | ICD-10-CM | POA: Diagnosis not present

## 2018-12-06 DIAGNOSIS — R6510 Systemic inflammatory response syndrome (SIRS) of non-infectious origin without acute organ dysfunction: Secondary | ICD-10-CM | POA: Diagnosis present

## 2018-12-06 DIAGNOSIS — B370 Candidal stomatitis: Secondary | ICD-10-CM | POA: Diagnosis not present

## 2018-12-06 DIAGNOSIS — G629 Polyneuropathy, unspecified: Secondary | ICD-10-CM | POA: Diagnosis present

## 2018-12-06 DIAGNOSIS — G63 Polyneuropathy in diseases classified elsewhere: Secondary | ICD-10-CM | POA: Diagnosis present

## 2018-12-06 DIAGNOSIS — I4891 Unspecified atrial fibrillation: Secondary | ICD-10-CM | POA: Diagnosis not present

## 2018-12-06 DIAGNOSIS — J9621 Acute and chronic respiratory failure with hypoxia: Secondary | ICD-10-CM | POA: Diagnosis not present

## 2018-12-06 DIAGNOSIS — R05 Cough: Secondary | ICD-10-CM | POA: Diagnosis not present

## 2018-12-06 DIAGNOSIS — Z8249 Family history of ischemic heart disease and other diseases of the circulatory system: Secondary | ICD-10-CM

## 2018-12-06 DIAGNOSIS — I10 Essential (primary) hypertension: Secondary | ICD-10-CM | POA: Diagnosis present

## 2018-12-06 DIAGNOSIS — Z87891 Personal history of nicotine dependence: Secondary | ICD-10-CM

## 2018-12-06 DIAGNOSIS — Z79899 Other long term (current) drug therapy: Secondary | ICD-10-CM

## 2018-12-06 DIAGNOSIS — Z7902 Long term (current) use of antithrombotics/antiplatelets: Secondary | ICD-10-CM

## 2018-12-06 DIAGNOSIS — F419 Anxiety disorder, unspecified: Secondary | ICD-10-CM | POA: Diagnosis present

## 2018-12-06 DIAGNOSIS — I739 Peripheral vascular disease, unspecified: Secondary | ICD-10-CM | POA: Diagnosis present

## 2018-12-06 LAB — BASIC METABOLIC PANEL
Anion gap: 10 (ref 5–15)
BUN: 13 mg/dL (ref 8–23)
CO2: 27 mmol/L (ref 22–32)
Calcium: 9.5 mg/dL (ref 8.9–10.3)
Chloride: 102 mmol/L (ref 98–111)
Creatinine, Ser: 0.9 mg/dL (ref 0.44–1.00)
GFR calc Af Amer: >60 mL/min (ref >60)
GFR calc non Af Amer: 58 mL/min — ABNORMAL LOW (ref >60)
Glucose, Bld: 127 mg/dL — ABNORMAL HIGH (ref 70–99)
Potassium: 3.8 mmol/L (ref 3.5–5.1)
Sodium: 139 mmol/L (ref 135–145)

## 2018-12-06 LAB — CBC
HCT: 33.5 % — ABNORMAL LOW (ref 36.0–46.0)
HCT: 29.3 % — ABNORMAL LOW (ref 36.0–46.0)
Hemoglobin: 9.9 g/dL — ABNORMAL LOW (ref 12.0–15.0)
Hemoglobin: 8.9 g/dL — ABNORMAL LOW (ref 12.0–15.0)
MCH: 26.3 pg (ref 26.0–34.0)
MCH: 27.1 pg (ref 26.0–34.0)
MCHC: 29.6 g/dL — ABNORMAL LOW (ref 30.0–36.0)
MCHC: 30.4 g/dL (ref 30.0–36.0)
MCV: 88.9 fL (ref 80.0–100.0)
MCV: 89.3 fL (ref 80.0–100.0)
Platelets: 284 10*3/uL (ref 150–400)
Platelets: 251 10*3/uL (ref 150–400)
RBC: 3.77 MIL/uL — ABNORMAL LOW (ref 3.87–5.11)
RBC: 3.28 MIL/uL — ABNORMAL LOW (ref 3.87–5.11)
RDW: 14.3 % (ref 11.5–15.5)
RDW: 14.6 % (ref 11.5–15.5)
WBC: 27 10*3/uL — ABNORMAL HIGH (ref 4.0–10.5)
WBC: 28.5 10*3/uL — ABNORMAL HIGH (ref 4.0–10.5)
nRBC: 0 % (ref 0.0–0.2)
nRBC: 0 % (ref 0.0–0.2)

## 2018-12-06 LAB — CREATININE, SERUM
Creatinine, Ser: 0.9 mg/dL (ref 0.44–1.00)
GFR calc Af Amer: >60 mL/min (ref >60)
GFR calc non Af Amer: 58 mL/min — ABNORMAL LOW (ref >60)

## 2018-12-06 LAB — I-STAT TROPONIN, ED: Troponin i, poc: 0.01 ng/mL (ref 0.00–0.08)

## 2018-12-06 MED ORDER — SENNOSIDES-DOCUSATE SODIUM 8.6-50 MG PO TABS: 1.0000 | ORAL_TABLET | Freq: Every evening | ORAL | Status: DC | PRN

## 2018-12-06 MED ORDER — ASPIRIN EC 81 MG PO TBEC
81.0000 mg | DELAYED_RELEASE_TABLET | Freq: Every day | ORAL | Status: DC
  Administered 2018-12-06 – 2018-12-11 (×6): 81 mg via ORAL
  Filled 2018-12-06 (×6): qty 1

## 2018-12-06 MED ORDER — FLUTICASONE FUROATE-VILANTEROL 200-25 MCG/INH IN AEPB
1.0000 | INHALATION_SPRAY | Freq: Every day | RESPIRATORY_TRACT | Status: DC
  Administered 2018-12-06 – 2018-12-07 (×2): 1 via RESPIRATORY_TRACT
  Filled 2018-12-06: qty 28

## 2018-12-06 MED ORDER — ENOXAPARIN SODIUM 40 MG/0.4ML ~~LOC~~ SOLN
40.0000 mg | SUBCUTANEOUS | Status: DC
  Administered 2018-12-06 – 2018-12-10 (×5): 40 mg via SUBCUTANEOUS
  Filled 2018-12-06 (×5): qty 0.4

## 2018-12-06 MED ORDER — IPRATROPIUM-ALBUTEROL 0.5-2.5 (3) MG/3ML IN SOLN
3.0000 mL | RESPIRATORY_TRACT | Status: DC
  Administered 2018-12-06: 3 mL via RESPIRATORY_TRACT
  Filled 2018-12-06: qty 3

## 2018-12-06 MED ORDER — FAMOTIDINE 20 MG PO TABS
20.0000 mg | ORAL_TABLET | Freq: Every day | ORAL | Status: DC
  Administered 2018-12-06 – 2018-12-10 (×5): 20 mg via ORAL
  Filled 2018-12-06 (×5): qty 1

## 2018-12-06 MED ORDER — IPRATROPIUM-ALBUTEROL 0.5-2.5 (3) MG/3ML IN SOLN
3.0000 mL | Freq: Once | RESPIRATORY_TRACT | Status: AC
  Administered 2018-12-06: 3 mL via RESPIRATORY_TRACT
  Filled 2018-12-06: qty 3

## 2018-12-06 MED ORDER — GABAPENTIN 100 MG PO CAPS: 100.0000 mg | ORAL_CAPSULE | Freq: Three times a day (TID) | ORAL | Status: DC | PRN

## 2018-12-06 MED ORDER — ALBUTEROL SULFATE (2.5 MG/3ML) 0.083% IN NEBU: 5.0000 mg | INHALATION_SOLUTION | Freq: Once | RESPIRATORY_TRACT | Status: DC

## 2018-12-06 MED ORDER — CLOPIDOGREL BISULFATE 75 MG PO TABS
75.0000 mg | ORAL_TABLET | Freq: Every day | ORAL | Status: DC
  Administered 2018-12-06 – 2018-12-10 (×5): 75 mg via ORAL
  Filled 2018-12-06 (×5): qty 1

## 2018-12-06 MED ORDER — AMLODIPINE BESYLATE 5 MG PO TABS
5.0000 mg | ORAL_TABLET | Freq: Every day | ORAL | Status: DC
  Administered 2018-12-06 – 2018-12-08 (×3): 5 mg via ORAL
  Filled 2018-12-06 (×3): qty 1

## 2018-12-06 MED ORDER — ATORVASTATIN CALCIUM 10 MG PO TABS
10.0000 mg | ORAL_TABLET | Freq: Every day | ORAL | Status: DC
  Administered 2018-12-06 – 2018-12-10 (×5): 10 mg via ORAL
  Filled 2018-12-06 (×5): qty 1

## 2018-12-06 MED ORDER — PREDNISONE 20 MG PO TABS
60.0000 mg | ORAL_TABLET | Freq: Once | ORAL | Status: AC
  Administered 2018-12-06: 60 mg via ORAL
  Filled 2018-12-06: qty 3

## 2018-12-06 MED ORDER — PREDNISONE 20 MG PO TABS: 60.0000 mg | ORAL_TABLET | Freq: Every day | ORAL | Status: DC

## 2018-12-06 MED ORDER — UMECLIDINIUM BROMIDE 62.5 MCG/INH IN AEPB
2.0000 | INHALATION_SPRAY | Freq: Every day | RESPIRATORY_TRACT | Status: DC
  Administered 2018-12-06 – 2018-12-08 (×3): 2 via RESPIRATORY_TRACT
  Filled 2018-12-06: qty 7

## 2018-12-06 MED ORDER — IPRATROPIUM-ALBUTEROL 0.5-2.5 (3) MG/3ML IN SOLN
3.0000 mL | RESPIRATORY_TRACT | Status: DC
  Administered 2018-12-06 – 2018-12-07 (×6): 3 mL via RESPIRATORY_TRACT
  Filled 2018-12-06 (×5): qty 3

## 2018-12-06 MED ORDER — AZITHROMYCIN 250 MG PO TABS
500.0000 mg | ORAL_TABLET | Freq: Once | ORAL | Status: AC
  Administered 2018-12-06: 500 mg via ORAL
  Filled 2018-12-06: qty 2

## 2018-12-06 MED ORDER — LORATADINE 10 MG PO TABS
10.0000 mg | ORAL_TABLET | Freq: Every day | ORAL | Status: DC
  Administered 2018-12-06 – 2018-12-11 (×6): 10 mg via ORAL
  Filled 2018-12-06 (×6): qty 1

## 2018-12-06 MED ORDER — ALBUTEROL SULFATE (2.5 MG/3ML) 0.083% IN NEBU: 2.5000 mg | INHALATION_SOLUTION | RESPIRATORY_TRACT | Status: DC | PRN

## 2018-12-06 MED ORDER — ONDANSETRON HCL 4 MG PO TABS: 4.0000 mg | ORAL_TABLET | Freq: Four times a day (QID) | ORAL | Status: DC | PRN

## 2018-12-06 NOTE — ED Notes (Signed)
Ambulated pt w pulse ox.   Resting 02 sat= 99% on RA Ambulating sats= dropped to 89% on RA.   Pt back in room after ambulating requesting oxygen. Pt placed on 3lpm [place] and sats back up to 100% on same.

## 2018-12-06 NOTE — ED Notes (Signed)
Patient transported to X-ray 

## 2018-12-06 NOTE — ED Notes (Signed)
Pt requested humidified o2.  Pt placed on same and oxygen placed on 2lpm [place]. Pt resting in rest with family and lights out.

## 2018-12-06 NOTE — ED Triage Notes (Signed)
Pt c/o of shortness of breath, states she has hx of COPD. Son in law states the shortness of breath woke her up, and she used rescue inhaler. 1L O2 applied in triage. BP elevated 135/102

## 2018-12-06 NOTE — ED Notes (Signed)
Admitting at bedside 

## 2018-12-06 NOTE — ED Notes (Signed)
Reg tray ordered for pt.

## 2018-12-06 NOTE — ED Provider Notes (Signed)
Emergency Department Provider Note   I have reviewed the triage vital signs and the nursing notes.   HISTORY  Chief Complaint Shortness of Breath   HPI Carla Dawson is a 84 y.o. female who presents to the emergency department today secondary to shortness of breath.  Patient has history of COPD and often has COPD exacerbations.  She woke up this morning walked to the bathroom which is only a few feet she got significantly dyspneic and checked her oxygen was 85%.  She normally has 90 to 91% after she walks.  She took some deep breaths and came back up but that she walked and got low again.  She took a bunch of breathing treatments over the course of a few hours and subsequently had one episode of nonbloody non-bilious vomiting (4 times) but none since then.  At this time patient only complaint of shortness of breath.  She had no chest pain, back pain.  She has some lower extremity swelling but this is her baseline.  She is had multiple stents and procedures done on her legs. No other associated or modifying symptoms.    Past Medical History:  Diagnosis Date  . Allergy    "maybe seasonal allergies" (07/08/2017)  . Arthritis    "hands" (07/08/2017)  . Chicken pox   . COPD (chronic obstructive pulmonary disease) ([place])   . High cholesterol    "took me off pills ~ 1-2 months ago" (07/08/2017)  . History of blood transfusion 2017   "when I had blood clot in my leg"  . Hypertension   . PAD (peripheral artery disease) ([place])    severe/notes 10/14/2016  . Pneumonia ~ 2015  . PVD (peripheral vascular disease) [facility])     Patient Active Problem List   Diagnosis Date Noted  . SIRS (systemic inflammatory response syndrome) ([place]) 12/06/2018  . Anemia of chronic disease 11/27/2018  . DOE (dyspnea on exertion) 11/25/2018  . PAC (premature atrial contraction) 11/16/2018  . Femoral artery occlusion, right ([place]) 11/04/2018  . Senile ecchymosis 07/20/2018  . S/P femoral-popliteal bypass  surgery 04/12/2018  . Claudication of right lower extremity ([place]) 04/09/2018  . Ischemia of extremity 04/09/2018  . COPD exacerbation ([place]) 03/23/2018  . Hyperlipidemia 02/24/2018  . Abnormal CXR 10/26/2016  . COPD  GOLD III  10/07/2016  . Essential hypertension, benign 10/07/2016  . PAD (peripheral artery disease) ([place]) 10/07/2016  . Neuropathy due to peripheral vascular disease ([place]) 10/07/2016    Past Surgical History:  Procedure Laterality Date  . ABDOMINAL AORTOGRAM N/A 04/22/2017   Procedure: Abdominal Aortogram;  Surgeon: Serafina Mitchell, MD;  Location: [place] [facility] CV [facility];  Service: Cardiovascular;  Laterality: N/A;  . ABDOMINAL AORTOGRAM W/LOWER EXTREMITY N/A 03/04/2017   Procedure: Abdominal Aortogram w/Lower Extremity;  Surgeon: Serafina Mitchell, MD;  Location: [facility] [place] [facility];  Service: Cardiovascular;  Laterality: N/A;  . ABDOMINAL AORTOGRAM W/LOWER EXTREMITY N/A 07/08/2017   Procedure: Abdominal Aortogram w/Lower Extremity;  Surgeon: Serafina Mitchell, MD;  Location: [place] CV LAB;  Service: Cardiovascular;  Laterality: N/A;  . BIOPSY THYROID    . CATARACT EXTRACTION W/ INTRAOCULAR LENS  IMPLANT, BILATERAL Bilateral   . EMBOLECTOMY Right 04/09/2018   Procedure: RIGHT COMMON FEMORAL ENDARTERECTOMY  WITH BOVINE PATCH ANGIOPLASTY; RIGHT LOWER LEG THROMBOEMBOLECTOMY INTRAOPERATIVE ARTERIOGRAM;  Surgeon: Waynetta Sandy, MD;  Location: [facility];  Service: Vascular;  Laterality: Right;  . ENDARTERECTOMY FEMORAL Right 11/04/2018   Procedure: ENDARTERECTOMY ILIO-FEMORAL ENDARTECTOMY;  Surgeon: Harold Barban  W, MD;  Location: [facility];  Service: Vascular;  Laterality: Right;  . FEMORAL-POPLITEAL BYPASS GRAFT Left 04/12/2018   Procedure: Vein Patch Angioplasty Below Knee Popliteal Artery; Left Femoral Endarterectomy with Profundaplasty; Left Femoral-Below Knee Popliteal Bypass Graft using a 89mm by 80cm Thin Wall Propaten Gortex Graft;  Surgeon: Elam Dutch, MD;  Location: [facility];  Service: Vascular;  Laterality: Left;  . HEMATOMA EVACUATION Right 2017   "S/P procedure; discharged; had to come back for emergency OR"  . LOWER EXTREMITY ANGIOGRAPHY Bilateral 04/22/2017   Procedure: Lower Extremity Angiography;  Surgeon: Serafina Mitchell, MD;  Location: [facility] CV LAB;  Service: Cardiovascular;  Laterality: Bilateral;  . LOWER EXTREMITY ANGIOGRAPHY Bilateral 04/07/2018   Procedure: LOWER EXTREMITY ANGIOGRAPHY;  Surgeon: Serafina Mitchell, MD;  Location: [facility] CV LAB;  Service: Cardiovascular;  Laterality: Bilateral;  . LOWER EXTREMITY ANGIOGRAPHY N/A 11/03/2018   Procedure: LOWER EXTREMITY ANGIOGRAPHY;  Surgeon: Serafina Mitchell, MD;  Location: [facility] CV LAB;  Service: Cardiovascular;  Laterality: N/A;  . PATCH ANGIOPLASTY Right 11/04/2018   Procedure: PATCH ANGIOPLASTY WITH Weyman Pedro;  Surgeon: Serafina Mitchell, MD;  Location[facility] OR;  Service: Vascular;  Laterality: Right;  . PERIPHERAL VASCULAR BALLOON ANGIOPLASTY Left 03/04/2017   Procedure: Peripheral Vascular Balloon Angioplasty;  Surgeon: Serafina Mitchell, MD;  Location: [facility] CV LAB;  Service: Cardiovascular;  Laterality: Left;  SFA and PT  . PERIPHERAL VASCULAR BALLOON ANGIOPLASTY Right 11/03/2018   Procedure: PERIPHERAL VASCULAR BALLOON ANGIOPLASTY;  Surgeon: Serafina Mitchell, MD;  Location: [facility] [place] CV LAB;  Service: Cardiovascular;  Laterality: Right;  common femoral  . PERIPHERAL VASCULAR INTERVENTION  07/08/2017  . PERIPHERAL VASCULAR INTERVENTION  07/08/2017   Procedure: Peripheral Vascular Intervention;  Surgeon: Serafina Mitchell, MD;  Location: [place] [facility] LAB;  Service: Cardiovascular;;  . PERIPHERAL VASCULAR INTERVENTION Left 04/07/2018   Procedure: PERIPHERAL VASCULAR INTERVENTION;  Surgeon: Serafina Mitchell, MD;  Location: [place] [facility];  Service: Cardiovascular;  Laterality: Left;  . THROMBECTOMY ILIAC ARTERY Right 11/04/2018   Procedure: THROMBECTOMY RIGHT ILIO AND  FEMORAL-TIBIAL BYPASS;  Surgeon: Serafina Mitchell, MD;  Location: [facility] OR;  Service: Vascular;  Laterality: Right;  Marland Kitchen VAGINAL HYSTERECTOMY    . VEIN BYPASS SURGERY      Current Outpatient Rx  . Order #: [id number] Class: Normal  . Order #: [id number] Class: Normal  . Order #: [id number] Class: Normal  . Order #: [id number] Class: Historical Med  . Order #: [id number] Class: Historical Med  . Order #: [id number] Class: Sample  . Order #: [id number] Class: Historical Med  . Order #: [id number] Class: Historical Med  . Order #: [id number] Class: Historical Med  . Order #: [id number] Class: Historical Med  . Order #: [id number] Class: Historical Med  . Order #: [id number] Class: Normal  . Order #: [id number] Class: Historical Med  . Order #: [id number] Class: Historical Med  . Order #: [id number] Class: Normal    Allergies Lortab [hydrocodone-acetaminophen] and Morphine and related  Family History  Problem Relation Age of Onset  . Arthritis Mother   . Hypertension Mother   . Arthritis Father   . Hypertension Father   . Cancer Brother   . Hypertension Son     Social History Social History   Tobacco Use  . Smoking status: Former Smoker    Packs/day: 1.50    Years: 52.00    Pack years: 78.00    Start date: 12/09/1953    Last attempt to quit: 12/09/2005    Years since quitting:  13.0  . Smokeless tobacco: Never Used  Substance Use Topics  . Alcohol use: No  . Drug use: No    Review of Systems  All other systems negative except as documented in the HPI. All pertinent positives and negatives as reviewed in the HPI. ____________________________________________   PHYSICAL EXAM:  VITAL SIGNS: ED Triage Vitals  Enc Vitals Group     BP 12/06/18 0702 (!) 135/102     Pulse Rate 12/06/18 0702 (!) 110     Resp 12/06/18 0702 20     Temp 12/06/18 0702 97.8 F (36.6 C)     Temp Source 12/06/18 0702 Oral     SpO2 12/06/18 0702 99 %     Weight 12/06/18 0703 112 lb (50.8 kg)     Height 12/06/18 0703 5\' 1"   (1.549 m)    Constitutional: Alert and oriented. Well appearing and in no acute distress. Eyes: Conjunctivae are normal. PERRL. EOMI. Head: Atraumatic. Nose: No congestion/rhinnorhea. Mouth/Throat: Mucous membranes are moist.  Oropharynx non-erythematous. Neck: No stridor.  No meningeal signs.   Cardiovascular: Normal rate, regular rhythm. Good peripheral circulation. Grossly normal heart sounds.   Respiratory: Normal respiratory effort.  No retractions. Lungs diminished with mild wheezing. Gastrointestinal: Soft and nontender. No distention.  Musculoskeletal: No lower extremity tenderness nor edema. No gross deformities of extremities. Neurologic:  Normal speech and language. No gross focal neurologic deficits are appreciated.  Skin:  Skin is warm, dry and intact. No rash noted.  ____________________________________________   LABS (all labs ordered are listed, but only abnormal results are displayed)  Labs Reviewed  BASIC METABOLIC PANEL - Abnormal; Notable for the following components:      Result Value   Glucose, Bld 127 (*)    GFR calc non Af Amer 58 (*)    All other components within normal limits  CBC - Abnormal; Notable for the following components:   WBC 27.0 (*)    RBC 3.77 (*)    Hemoglobin 9.9 (*)    HCT 33.5 (*)    MCHC 29.6 (*)    All other components within normal limits  CREATININE, SERUM - Abnormal; Notable for the following components:   GFR calc non Af Amer 58 (*)    All other components within normal limits  CBC  I-STAT TROPONIN, ED   ____________________________________________  EKG   EKG Interpretation  Date/Time:  Sunday December 06 2018 07:02:26 EST Ventricular Rate:  121 PR Interval:  124 QRS Duration: 62 QT Interval:  320 QTC Calculation: 454 R Axis:   78 Text Interpretation:  Sinus tachycardia with Premature supraventricular complexes and Fusion complexes Septal infarct , age undetermined Abnormal ECG possibly  Multifocal atrial  tachycardia vs Afib   Confirmed by Merrily Pew [phone]) on 12/06/2018 7:15:40 AM       ____________________________________________  RADIOLOGY  Dg Chest 2 View  Result Date: 12/06/2018 CLINICAL DATA:  84 year old female with history of cough since yesterday evening and this morning. EXAM: CHEST - 2 VIEW COMPARISON:  Chest x-ray 11/25/2018. FINDINGS: Lung volumes are slightly increased with emphysematous changes. No consolidative airspace disease. No pleural effusions. No pneumothorax. No pulmonary nodule or mass noted. Pulmonary vasculature and the cardiomediastinal silhouette are within normal limits. Atherosclerosis in the thoracic aorta. Electronic device projecting over the upper left hemithorax again noted. IMPRESSION: 1. No radiographic evidence of acute cardiopulmonary disease. 2. Aortic atherosclerosis. 3. Emphysema. Electronically Signed   By: Vinnie Langton M.D.   On: 12/06/2018 08:56    ____________________________________________  PROCEDURES  Procedure(s) performed:   Procedures  CRITICAL CARE Performed by: Merrily Pew Total critical care time: 35 minutes Critical care time was exclusive of separately billable procedures and treating other patients. Critical care was necessary to treat or prevent imminent or life-threatening deterioration. Critical care was time spent personally by me on the following activities: development of treatment plan with patient and/or surrogate as well as nursing, discussions with consultants, evaluation of patient's response to treatment, examination of patient, obtaining history from patient or surrogate, ordering and performing treatments and interventions, ordering and review of laboratory studies, ordering and review of radiographic studies, pulse oximetry and re-evaluation of patient's condition.  ____________________________________________   INITIAL IMPRESSION / ASSESSMENT AND PLAN / ED COURSE  COPD exacerbation.  She has a  significant elevated white blood cell count.  She is on steroids for last few weeks however this is still higher than I would expect with that so will cover with antibiotics.  We will continue breathing treatments to see if we get her doing better and do ambulation on pulse ox and decide disposition after that.  Patient not really improvign much. Still with tight lung sounds. Ambulates with pulse ox in upper 80's, frequent breaks to gasp for air. Will d/w medicine for admission for further management.    Pertinent labs & imaging results that were available during my care of the patient were reviewed by me and considered in my medical decision making (see chart for details).  ____________________________________________  FINAL CLINICAL IMPRESSION(S) / ED DIAGNOSES  Final diagnoses:  COPD exacerbation ([place])  Hypoxia     MEDICATIONS GIVEN DURING THIS VISIT:  Medications  enoxaparin (LOVENOX) injection 40 mg (has no administration in time range)  senna-docusate (Senokot-S) tablet 1 tablet (has no administration in time range)  ondansetron (ZOFRAN) tablet 4 mg (has no administration in time range)  albuterol (PROVENTIL) (2.5 MG/3ML) 0.083% nebulizer solution 2.5 mg (has no administration in time range)  ipratropium-albuterol (DUONEB) 0.5-2.5 (3) MG/3ML nebulizer solution 3 mL (3 mLs Nebulization Given 12/06/18 1239)  predniSONE (DELTASONE) tablet 60 mg (has no administration in time range)  amLODipine (NORVASC) tablet 5 mg (has no administration in time range)  aspirin EC tablet 81 mg (has no administration in time range)  atorvastatin (LIPITOR) tablet 10 mg (has no administration in time range)  fluticasone furoate-vilanterol (BREO ELLIPTA) 200-25 MCG/INH 1 puff (has no administration in time range)  loratadine (CLARITIN) tablet 10 mg (has no administration in time range)  clopidogrel (PLAVIX) tablet 75 mg (has no administration in time range)  gabapentin (NEURONTIN) capsule 100 mg (has  no administration in time range)  famotidine (PEPCID) tablet 20 mg (has no administration in time range)  Tiotropium Bromide Monohydrate AERS 2 puff (has no administration in time range)  predniSONE (DELTASONE) tablet 60 mg (60 mg Oral Given 12/06/18 0753)  ipratropium-albuterol (DUONEB) 0.5-2.5 (3) MG/3ML nebulizer solution 3 mL (3 mLs Nebulization Given 12/06/18 1024)  azithromycin (ZITHROMAX) tablet 500 mg (500 mg Oral Given 12/06/18 1023)     NEW OUTPATIENT MEDICATIONS STARTED DURING THIS VISIT:  New Prescriptions   No medications on file    Note:  This note was prepared with assistance of Dragon voice recognition software. Occasional wrong-word or sound-a-like substitutions may have occurred due to the inherent limitations of voice recognition software.   Merrily Pew, MD 12/06/18 1340

## 2018-12-06 NOTE — H&P (Signed)
History and Physical    Carla Dawson JGO:[id number] DOB: [birth year] DOA: 12/06/2018  PCP: Martinique, Betty G, MD  Patient coming from: Home  Chief Complaint: sob  HPI: Carla Dawson is a 84 y.o. female with medical history significant of COPD. OA, hyperlipidemia, anemia of chronic disease, HTN, PVD/PAD.  Here with her daughter at bedside.  Over the past week, the patient has been having worsening shortness of breath.  This morning, she woke up and her oxygen saturation dropped to the mid 80s.  This worked her up and she can never quite fully settle down.  She has been breathing rapidly despite normal saturations at home.  Nebulization treatments did not work.  She had an episode where she spit up some mucus but still felt her breathing was labored.  She is compliant with home Spiriva and Symbicort.  She has been using her albuterol frequently at home over the past week.  The patient is having some cough and slight wheezing.  She denies any fevers, recent illnesses, chest pain.  ED Course: X-ray unremarkable, white blood count notable at 27.  Has reportedly been on prednisone.  Hemoglobin near baseline with known diagnosis of anemia of chronic disease.  Azithromycin and prednisone started.  Review of Systems: As per HPI otherwise 10 point review of systems negative.   Past Medical History:  Diagnosis Date  . Allergy    "maybe seasonal allergies" (07/08/2017)  . Arthritis    "hands" (07/08/2017)  . Chicken pox   . COPD (chronic obstructive pulmonary disease) ([place])   . High cholesterol    "took me off pills ~ 1-2 months ago" (07/08/2017)  . History of blood transfusion 2017   "when I had blood clot in my leg"  . Hypertension   . PAD (peripheral artery disease) ([place])    severe/notes 10/14/2016  . Pneumonia ~ 2015  . PVD (peripheral vascular disease) ([place])     Past Surgical History:  Procedure Laterality Date  . ABDOMINAL AORTOGRAM N/A 04/22/2017   Procedure: Abdominal  Aortogram;  Surgeon: Serafina Mitchell, MD;  Location: [facility];  Service: Cardiovascular;  Laterality: N/A;  . ABDOMINAL AORTOGRAM W/LOWER EXTREMITY N/A 03/04/2017   Procedure: Abdominal Aortogram w/Lower Extremity;  Surgeon: Serafina Mitchell, MD;  Location: [facility];  Service: Cardiovascular;  Laterality: N/A;  . ABDOMINAL AORTOGRAM W/LOWER EXTREMITY N/A 07/08/2017   Procedure: Abdominal Aortogram w/Lower Extremity;  Surgeon: Serafina Mitchell, MD;  Location: [facility];  Service: Cardiovascular;  Laterality: N/A;  . BIOPSY THYROID    . CATARACT EXTRACTION W/ INTRAOCULAR LENS  IMPLANT, BILATERAL Bilateral   . EMBOLECTOMY Right 04/09/2018   Procedure: RIGHT COMMON FEMORAL ENDARTERECTOMY  WITH BOVINE PATCH ANGIOPLASTY; RIGHT LOWER LEG THROMBOEMBOLECTOMY INTRAOPERATIVE ARTERIOGRAM;  Surgeon: Waynetta Sandy, MD;  Location: [facility];  Service: Vascular;  Laterality: Right;  . ENDARTERECTOMY FEMORAL Right 11/04/2018   Procedure: ENDARTERECTOMY ILIO-FEMORAL ENDARTECTOMY;  Surgeon: Serafina Mitchell, MD;  Location: [facility];  Service: Vascular;  Laterality: Right;  . FEMORAL-POPLITEAL BYPASS GRAFT Left 04/12/2018   Procedure: Vein Patch Angioplasty Below Knee Popliteal Artery; Left Femoral Endarterectomy with Profundaplasty; Left Femoral-Below Knee Popliteal Bypass Graft using a 55mm by 80cm Thin Wall Propaten Gortex Graft;  Surgeon: Elam Dutch, MD;  Location: [facility];  Service: Vascular;  Laterality: Left;  . HEMATOMA EVACUATION Right 2017   "S/P procedure; discharged; had to come back for emergency OR"  . LOWER EXTREMITY ANGIOGRAPHY Bilateral 04/22/2017  Procedure: Lower Extremity Angiography;  Surgeon: Serafina Mitchell, MD;  Location: [facility];  Service: Cardiovascular;  Laterality: Bilateral;  . LOWER EXTREMITY ANGIOGRAPHY Bilateral 04/07/2018   Procedure: LOWER EXTREMITY ANGIOGRAPHY;  Surgeon: Serafina Mitchell, MD;  Location: [facility];  Service:  Cardiovascular;  Laterality: Bilateral;  . LOWER EXTREMITY ANGIOGRAPHY N/A 11/03/2018   Procedure: LOWER EXTREMITY ANGIOGRAPHY;  Surgeon: Serafina Mitchell, MD;  Location: [facility];  Service: Cardiovascular;  Laterality: N/A;  . PATCH ANGIOPLASTY Right 11/04/2018   Procedure: PATCH ANGIOPLASTY WITH Weyman Pedro;  Surgeon: Serafina Mitchell, MD;  Location: [facility];  Service: Vascular;  Laterality: Right;  . PERIPHERAL VASCULAR BALLOON ANGIOPLASTY Left 03/04/2017   Procedure: Peripheral Vascular Balloon Angioplasty;  Surgeon: Serafina Mitchell, MD;  Location: [facility];  Service: Cardiovascular;  Laterality: Left;  SFA and PT  . PERIPHERAL VASCULAR BALLOON ANGIOPLASTY Right 11/03/2018   Procedure: PERIPHERAL VASCULAR BALLOON ANGIOPLASTY;  Surgeon: Serafina Mitchell, MD;  Location: [facility];  Service: Cardiovascular;  Laterality: Right;  common femoral  . PERIPHERAL VASCULAR INTERVENTION  07/08/2017  . PERIPHERAL VASCULAR INTERVENTION  07/08/2017   Procedure: Peripheral Vascular Intervention;  Surgeon: Serafina Mitchell, MD;  Location: [facility];  Service: Cardiovascular;;  . PERIPHERAL VASCULAR INTERVENTION Left 04/07/2018   Procedure: PERIPHERAL VASCULAR INTERVENTION;  Surgeon: Serafina Mitchell, MD;  Location: [facility];  Service: Cardiovascular;  Laterality: Left;  . THROMBECTOMY ILIAC ARTERY Right 11/04/2018   Procedure: THROMBECTOMY RIGHT ILIO AND FEMORAL-TIBIAL BYPASS;  Surgeon: Serafina Mitchell, MD;  Location: [facility];  Service: Vascular;  Laterality: Right;  Marland Kitchen VAGINAL HYSTERECTOMY    . VEIN BYPASS SURGERY       reports that she quit smoking about 13 years ago. She started smoking about 65 years ago. She has a 78.00 pack-year smoking history. She has never used smokeless tobacco. She reports that she does not drink alcohol or use drugs.  Allergies  Allergen Reactions  . Lortab [Hydrocodone-Acetaminophen] Shortness Of Breath and Itching  . Morphine And  Related Other (See Comments)    Unknown reaction    Family History  Problem Relation Age of Onset  . Arthritis Mother   . Hypertension Mother   . Arthritis Father   . Hypertension Father   . Cancer Brother   . Hypertension Son    Prior to Admission medications   Medication Sig Start Date End Date Taking? Authorizing Provider  albuterol (PROAIR HFA) 108 (90 Base) MCG/ACT inhaler Inhale 2 puffs into the lungs every 4 (four) hours as needed for wheezing or shortness of breath. 11/24/18  Yes Tanda Rockers, MD  albuterol (PROVENTIL) (2.5 MG/3ML) 0.083% nebulizer solution INHALE 1 NEBULE VIA NEBULIZER EVERY 4 HOURS AS NEEDED FOR WHEEZING OR SHORTNESS OF BREATH Patient taking differently: Take 2.5 mg by nebulization every 4 (four) hours as needed for wheezing or shortness of breath.  07/01/18  Yes Tanda Rockers, MD  amLODipine (NORVASC) 5 MG tablet Take 1 tablet (5 mg total) by mouth at bedtime. 02/24/18  Yes Martinique, Betty G, MD  aspirin EC 81 MG tablet Take 81 mg by mouth daily.   Yes [provider]  atorvastatin (LIPITOR) 10 MG tablet Take 1 tablet (10 mg total) by mouth daily at 6 PM. 04/10/18  Yes Rhyne, Samantha J, PA-C  budesonide-formoterol (SYMBICORT) 160-4.5 MCG/ACT inhaler Inhale 2 puffs into the lungs 2 (two) times daily. 11/17/18  Yes  Tanda Rockers, MD  cetirizine (ZYRTEC) 10 MG tablet Take 10 mg by mouth at bedtime.    Yes [provider]  clopidogrel (PLAVIX) 75 MG tablet Take 75 mg by mouth at bedtime.  04/10/18  Yes Rhyne, Hulen Shouts, PA-C  dextromethorphan (DELSYM) 30 MG/5ML liquid Take 30 mg by mouth 2 (two) times daily as needed for cough.   Yes [provider]  dextromethorphan-guaiFENesin (MUCINEX DM) 30-600 MG 12hr tablet Take 0.5 tablets by mouth 2 (two) times daily.   Yes [provider]  famotidine (PEPCID) 20 MG tablet Take 20 mg by mouth at bedtime.   Yes [provider]  gabapentin (NEURONTIN) 100 MG capsule Take 1 capsule  (100 mg total) by mouth 3 (three) times daily. Patient taking differently: Take 100 mg by mouth 3 (three) times daily as needed (for legs pain).  06/09/18  Yes Serafina Mitchell, MD  Multiple Vitamin (MULTIVITAMIN WITH MINERALS) TABS tablet Take 1 tablet by mouth at bedtime.    Yes [provider]  omeprazole (PRILOSEC) 20 MG capsule Take 20 mg by mouth daily.   Yes [provider]  SPIRIVA RESPIMAT 2.5 MCG/ACT AERS INHALE 2 PUFFS INTO THE LUNGS DAILY Patient taking differently: Inhale 2 puffs into the lungs daily.  07/02/18  Yes Tanda Rockers, MD  traMADol (ULTRAM) 50 MG tablet Take 1 tablet (50 mg total) by mouth every 6 (six) hours as needed for moderate pain. 11/05/18  Yes Gabriel Earing, PA-C    Physical Exam: Vitals:   12/06/18 0900 12/06/18 0930 12/06/18 1000 12/06/18 1100  BP: (!) 143/54 (!) 159/58 140/67 (!) 154/61  Pulse: 85 (!) 104 78 (!) 116  Resp: (!) 27 (!) 21 (!) 26 (!) 25  Temp:      TempSrc:      SpO2: 100% 100% 100% 99%  Weight:      Height:       Constitutional: NAD, calm, comfortable Eyes: PERRL, lids and conjunctivae normal ENMT: Mucous membranes are moist. Posterior pharynx clear of any exudate or lesions. Edentulous. Neck: normal, supple, no masses, no thyromegaly Respiratory: clear to auscultation bilaterally, + faint and diffuse wheezing, no crackles. Normal respiratory effort. No accessory muscle use.  Cardiovascular: Regular rate and rhythm, no murmurs / rubs / gallops. No extremity edema. 2+ pedal pulses. No carotid bruits.  Abdomen: no tenderness, no masses palpated. No hepatosplenomegaly. Bowel sounds positive.  Musculoskeletal: no clubbing / cyanosis. No joint deformity upper and lower extremities. Good ROM, no contractures. Normal muscle tone.  Skin: no rashes, lesions, ulcers.  Neurologic: CN 2-12 grossly intact. Sensation intact, DTR normal. Strength 5/5 in all 4.  Psychiatric: Normal judgment and insight. Alert and oriented x 3.  Normal mood.   Labs on Admission: I have personally reviewed following labs and imaging studies  CBC: Recent Labs  Lab 12/06/18 0750  WBC 27.0*  HGB 9.9*  HCT 33.5*  MCV 88.9  PLT 846   Basic Metabolic Panel: Recent Labs  Lab 12/06/18 0750  NA 139  K 3.8  CL 102  CO2 27  GLUCOSE 127*  BUN 13  CREATININE 0.90  CALCIUM 9.5   GFR: Estimated Creatinine Clearance: 34.5 mL/min (by C-G formula based on SCr of 0.9 mg/dL).   Radiological Exams on Admission: Dg Chest 2 View  Result Date: 12/06/2018 CLINICAL DATA:  84 year old female with history of cough since yesterday evening and this morning. EXAM: CHEST - 2 VIEW COMPARISON:  Chest x-ray 11/25/2018. FINDINGS: Lung  volumes are slightly increased with emphysematous changes. No consolidative airspace disease. No pleural effusions. No pneumothorax. No pulmonary nodule or mass noted. Pulmonary vasculature and the cardiomediastinal silhouette are within normal limits. Atherosclerosis in the thoracic aorta. Electronic device projecting over the upper left hemithorax again noted. IMPRESSION: 1. No radiographic evidence of acute cardiopulmonary disease. 2. Aortic atherosclerosis. 3. Emphysema. Electronically Signed   By: Vinnie Langton M.D.   On: 12/06/2018 08:56   EKG: Independently reviewed.  Assessment/Plan Principal Problem:   COPD exacerbation (HCC)  Supplemental O2, wean as able, maintain sats >89%  Prednisone 60 mg/d  Azithromycin 500 mg/d  Cont home Symbicort and Spiriva  Duoneb q 4 hrs  Albuterol neb q 2 hrs prn  Active Problems:   Essential hypertension, benign  Continue Norvasc 5 mg daily    PAD (peripheral artery disease) (HCC)  Continue Plavix 75 mg daily  Continue aspirin 81 mg daily    Neuropathy due to peripheral vascular disease (HCC)  Continue gabapentin 100 mg 3 times daily prn    Hyperlipidemia  Continue Lipitor 10 mg daily    Anemia of chronic disease  Monitor CBC    SIRS (systemic inflammatory  response syndrome) ([place])  Patient clinically well, will monitor  DVT prophylaxis: Lovenox Code Status: Full Family Communication: Self Disposition Plan: Home Consults called: None Admission status: Observation  Severity of Illness: The appropriate patient status for this patient is OBSERVATION. Observation status is judged to be reasonable and necessary in order to provide the required intensity of service to ensure the patient's safety. The patient's presenting symptoms, physical exam findings, and initial radiographic and laboratory data in the context of their medical condition is felt to place them at decreased risk for further clinical deterioration. Furthermore, it is anticipated that the patient will be medically stable for discharge from the hospital within 2 midnights of admission. The following factors support the patient status of observation.   " The patient's presenting symptoms include dyspnea. " The physical exam findings include wheezing. " The initial radiographic and laboratory data are unremarkable CXR and elevated WBC.    Shelda Pal, DO [facility] [email or web address] 12/06/2018, 12:08 PM

## 2018-12-06 NOTE — ED Notes (Signed)
Pts daughter [name] advised she may leave and she would like to be contacted if anything changes.  Cell: [phone]

## 2018-12-07 ENCOUNTER — Encounter (HOSPITAL_COMMUNITY): Payer: Self-pay

## 2018-12-07 DIAGNOSIS — I10 Essential (primary) hypertension: Secondary | ICD-10-CM | POA: Diagnosis present

## 2018-12-07 DIAGNOSIS — Z79899 Other long term (current) drug therapy: Secondary | ICD-10-CM | POA: Diagnosis not present

## 2018-12-07 DIAGNOSIS — Z8249 Family history of ischemic heart disease and other diseases of the circulatory system: Secondary | ICD-10-CM | POA: Diagnosis not present

## 2018-12-07 DIAGNOSIS — J441 Chronic obstructive pulmonary disease with (acute) exacerbation: Secondary | ICD-10-CM | POA: Diagnosis present

## 2018-12-07 DIAGNOSIS — D638 Anemia in other chronic diseases classified elsewhere: Secondary | ICD-10-CM | POA: Diagnosis present

## 2018-12-07 DIAGNOSIS — Z7982 Long term (current) use of aspirin: Secondary | ICD-10-CM | POA: Diagnosis not present

## 2018-12-07 DIAGNOSIS — R0902 Hypoxemia: Secondary | ICD-10-CM | POA: Diagnosis not present

## 2018-12-07 DIAGNOSIS — E785 Hyperlipidemia, unspecified: Secondary | ICD-10-CM | POA: Diagnosis present

## 2018-12-07 DIAGNOSIS — I739 Peripheral vascular disease, unspecified: Secondary | ICD-10-CM | POA: Diagnosis present

## 2018-12-07 DIAGNOSIS — Z7902 Long term (current) use of antithrombotics/antiplatelets: Secondary | ICD-10-CM | POA: Diagnosis not present

## 2018-12-07 DIAGNOSIS — F419 Anxiety disorder, unspecified: Secondary | ICD-10-CM | POA: Diagnosis present

## 2018-12-07 DIAGNOSIS — J9621 Acute and chronic respiratory failure with hypoxia: Secondary | ICD-10-CM | POA: Diagnosis present

## 2018-12-07 DIAGNOSIS — B370 Candidal stomatitis: Secondary | ICD-10-CM | POA: Diagnosis not present

## 2018-12-07 DIAGNOSIS — Z87891 Personal history of nicotine dependence: Secondary | ICD-10-CM | POA: Diagnosis not present

## 2018-12-07 DIAGNOSIS — G629 Polyneuropathy, unspecified: Secondary | ICD-10-CM | POA: Diagnosis present

## 2018-12-07 DIAGNOSIS — I4891 Unspecified atrial fibrillation: Secondary | ICD-10-CM | POA: Diagnosis not present

## 2018-12-07 LAB — BASIC METABOLIC PANEL
Anion gap: 9 (ref 5–15)
BUN: 12 mg/dL (ref 8–23)
CO2: 28 mmol/L (ref 22–32)
Calcium: 9.2 mg/dL (ref 8.9–10.3)
Chloride: 103 mmol/L (ref 98–111)
Creatinine, Ser: 0.74 mg/dL (ref 0.44–1.00)
Glucose, Bld: 102 mg/dL — ABNORMAL HIGH (ref 70–99)
Potassium: 4 mmol/L (ref 3.5–5.1)
SODIUM: 140 mmol/L (ref 135–145)

## 2018-12-07 LAB — CBC
HCT: 28 % — ABNORMAL LOW (ref 36.0–46.0)
Hemoglobin: 8.6 g/dL — ABNORMAL LOW (ref 12.0–15.0)
MCH: 27 pg (ref 26.0–34.0)
MCHC: 30.7 g/dL (ref 30.0–36.0)
MCV: 88.1 fL (ref 80.0–100.0)
Platelets: 258 10*3/uL (ref 150–400)
RBC: 3.18 MIL/uL — ABNORMAL LOW (ref 3.87–5.11)
RDW: 14.3 % (ref 11.5–15.5)
WBC: 23.4 10*3/uL — AB (ref 4.0–10.5)
nRBC: 0 % (ref 0.0–0.2)

## 2018-12-07 LAB — INFLUENZA PANEL BY PCR (TYPE A & B)
Influenza A By PCR: NEGATIVE
Influenza B By PCR: NEGATIVE

## 2018-12-07 MED ORDER — BUDESONIDE 0.25 MG/2ML IN SUSP
0.2500 mg | Freq: Two times a day (BID) | RESPIRATORY_TRACT | Status: DC
  Administered 2018-12-07 – 2018-12-09 (×6): 0.25 mg via RESPIRATORY_TRACT
  Filled 2018-12-07 (×7): qty 2

## 2018-12-07 MED ORDER — IBUPROFEN 200 MG PO TABS
200.0000 mg | ORAL_TABLET | ORAL | Status: DC | PRN
  Administered 2018-12-07: 200 mg via ORAL
  Filled 2018-12-07: qty 1

## 2018-12-07 MED ORDER — DOXYCYCLINE HYCLATE 100 MG PO TABS
100.0000 mg | ORAL_TABLET | Freq: Two times a day (BID) | ORAL | Status: AC
  Administered 2018-12-07 – 2018-12-09 (×6): 100 mg via ORAL
  Filled 2018-12-07 (×6): qty 1

## 2018-12-07 MED ORDER — IPRATROPIUM-ALBUTEROL 0.5-2.5 (3) MG/3ML IN SOLN
3.0000 mL | Freq: Three times a day (TID) | RESPIRATORY_TRACT | Status: DC
  Administered 2018-12-07 – 2018-12-09 (×5): 3 mL via RESPIRATORY_TRACT
  Filled 2018-12-07 (×4): qty 3

## 2018-12-07 MED ORDER — METHYLPREDNISOLONE SODIUM SUCC 40 MG IJ SOLR
40.0000 mg | Freq: Two times a day (BID) | INTRAMUSCULAR | Status: DC
  Administered 2018-12-07 – 2018-12-09 (×4): 40 mg via INTRAVENOUS
  Filled 2018-12-07 (×4): qty 1

## 2018-12-07 NOTE — Telephone Encounter (Addendum)
   Primary Cardiologist: Dr Harrell Gave  Chart reviewed as part of pre-operative protocol coverage. Given past medical history and time since last visit, based on ACC/AHA guidelines, Carla Dawson would be at acceptable risk for the planned procedure without further cardiovascular testing.   I will route this recommendation to the requesting party via Epic fax function and remove from pre-op pool.  Please call with questions.  Kerin Ransom, PA-C 12/07/2018, 2:30 PM   Addendum: It appears the patient was admitted to the hospital yesterday with COPD exacerbation.  She may need to be re evaluated before clearance is granted.   Kerin Ransom PA-C 12/07/2018 2:34 PM

## 2018-12-07 NOTE — Progress Notes (Signed)
PROGRESS NOTE  Carla Dawson BJS:[id number] DOB: [birth year] DOA: 12/06/2018 PCP: Martinique, Betty G, MD   LOS: 0 days   Brief Narrative / Interim history: 84 year old female with history of COPD, hyperlipidemia, hypertension, peripheral arterial disease who presents to the hospital on 12/29 with progressive shortness of breath as well as worsening cough along with noted hypoxia into the mid 80s at home.  She was admitted with COPD exacerbation.  Subjective: Feels a little bit better however still short of breath this morning, has not been up.  Denies any chest pain, denies any abdominal pain, nausea or vomiting.  Assessment & Plan: Principal Problem:   COPD exacerbation ([place]) Active Problems:   Essential hypertension, benign   PAD (peripheral artery disease) (HCC)   Neuropathy due to peripheral vascular disease (HCC)   Hyperlipidemia   Anemia of chronic disease   SIRS (systemic inflammatory response syndrome) (HCC)   Principal Problem Acute on chronic hypoxic respiratory failure due to COPD exacerbation -Patient was admitted to the hospital with respiratory failure, was found to be wheezing and with COPD exacerbation.  Still has some wheezing and "tight" sounding this morning, start Solu-Medrol and discontinue prednisone -Added doxycycline given productive cough -She has been feeling short of breath for a while at home suspect a degree of chronic hypoxia, will likely need home oxygen on discharge  Additional Problems Hypertension -Continue Norvasc, blood pressure stable  PAD -Continue aspirin and Plavix, no concerning symptoms at this point  Neuropathy due to peripheral vascular disease -Continue gabapentin  Hyperlipidemia -Continue Lipitor  Anemia of chronic disease -Hemoglobin stable, no evidence of bleeding  Scheduled Meds: . amLODipine  5 mg Oral QHS  . aspirin EC  81 mg Oral Daily  . atorvastatin  10 mg Oral q1800  . budesonide (PULMICORT) nebulizer  solution  0.25 mg Nebulization BID  . clopidogrel  75 mg Oral QHS  . doxycycline  100 mg Oral Q12H  . enoxaparin (LOVENOX) injection  40 mg Subcutaneous Q24H  . famotidine  20 mg Oral QHS  . ipratropium-albuterol  3 mL Nebulization Q4H  . loratadine  10 mg Oral Daily  . methylPREDNISolone (SOLU-MEDROL) injection  40 mg Intravenous Q12H  . umeclidinium bromide  2 puff Inhalation Daily   Continuous Infusions: PRN Meds:.albuterol, gabapentin, ondansetron, senna-docusate  DVT prophylaxis: Lovenox Code Status: Full code Family Communication: No family present at bedside Disposition Plan: Home when ready  Consultants:   None  Procedures:   None   Antimicrobials:  Doxycycline 12/29 >>  Objective: Vitals:   12/07/18 0405 12/07/18 0722 12/07/18 0747 12/07/18 1122  BP:   (!) 141/53   Pulse: 77  88   Resp: 16  18   Temp:   98.2 F (36.8 C)   TempSrc:   Oral   SpO2: 99% 99% 99% 98%  Weight:      Height:       No intake or output data in the 24 hours ending 12/07/18 1215 Filed Weights   12/06/18 0703  Weight: 50.8 kg    Examination:  Constitutional: Pursed lip breathing but no significant distress Eyes: No scleral icterus ENMT: Mucous membranes are moist. No oropharyngeal exudates Neck: normal, supple, no masses, no thyromegaly Respiratory: Diminished/distant breath sounds, end expiratory wheezing bilateral lung fields, no crackles heard.  Increased work of breathing Cardiovascular: Regular rate and rhythm, no peripheral edema Abdomen: no tenderness. Bowel sounds positive.  Musculoskeletal: no clubbing / cyanosis.  Skin: no rashes seen Neurologic: CN 2-12 grossly intact. Strength  5/5 in all 4.  Psychiatric: Normal judgment and insight. Alert and oriented x 3. Normal mood.    Data Reviewed: I have independently reviewed following labs and imaging studies   CBC: Recent Labs  Lab 12/06/18 0750 12/06/18 1237 12/07/18 0640  WBC 27.0* 28.5* 23.4*  HGB 9.9* 8.9*  8.6*  HCT 33.5* 29.3* 28.0*  MCV 88.9 89.3 88.1  PLT 284 251 546   Basic Metabolic Panel: Recent Labs  Lab 12/06/18 0750 12/06/18 1237 12/07/18 0640  NA 139  --  140  K 3.8  --  4.0  CL 102  --  103  CO2 27  --  28  GLUCOSE 127*  --  102*  BUN 13  --  12  CREATININE 0.90 0.90 0.74  CALCIUM 9.5  --  9.2   GFR: Estimated Creatinine Clearance: 38.8 mL/min (by C-G formula based on SCr of 0.74 mg/dL). Liver Function Tests: No results for input(s): AST, ALT, ALKPHOS, BILITOT, PROT, ALBUMIN in the last 168 hours. No results for input(s): LIPASE, AMYLASE in the last 168 hours. No results for input(s): AMMONIA in the last 168 hours. Coagulation Profile: No results for input(s): INR, PROTIME in the last 168 hours. Cardiac Enzymes: No results for input(s): CKTOTAL, CKMB, CKMBINDEX, TROPONINI in the last 168 hours. BNP (last 3 results) Recent Labs    11/25/18 1522  PROBNP 282.0*   HbA1C: No results for input(s): HGBA1C in the last 72 hours. CBG: No results for input(s): GLUCAP in the last 168 hours. Lipid Profile: No results for input(s): CHOL, HDL, LDLCALC, TRIG, CHOLHDL, LDLDIRECT in the last 72 hours. Thyroid Function Tests: No results for input(s): TSH, T4TOTAL, FREET4, T3FREE, THYROIDAB in the last 72 hours. Anemia Panel: No results for input(s): VITAMINB12, FOLATE, FERRITIN, TIBC, IRON, RETICCTPCT in the last 72 hours. Urine analysis: No results found for: COLORURINE, APPEARANCEUR, LABSPEC, PHURINE, GLUCOSEU, HGBUR, BILIRUBINUR, KETONESUR, PROTEINUR, UROBILINOGEN, NITRITE, LEUKOCYTESUR Sepsis Labs: Invalid input(s): PROCALCITONIN, LACTICIDVEN  No results found for this or any previous visit (from the past 240 hour(s)).    Radiology Studies: Dg Chest 2 View  Result Date: 12/06/2018 CLINICAL DATA:  84 year old female with history of cough since yesterday evening and this morning. EXAM: CHEST - 2 VIEW COMPARISON:  Chest x-ray 11/25/2018. FINDINGS: Lung volumes are  slightly increased with emphysematous changes. No consolidative airspace disease. No pleural effusions. No pneumothorax. No pulmonary nodule or mass noted. Pulmonary vasculature and the cardiomediastinal silhouette are within normal limits. Atherosclerosis in the thoracic aorta. Electronic device projecting over the upper left hemithorax again noted. IMPRESSION: 1. No radiographic evidence of acute cardiopulmonary disease. 2. Aortic atherosclerosis. 3. Emphysema. Electronically Signed   By: Vinnie Langton M.D.   On: 12/06/2018 08:56    Marzetta Board, MD, PhD [facility] Pager [phone]  If 7PM-7AM, please contact night-coverage [email or web address] Password [email or web address] 12/07/2018, 12:15 PM

## 2018-12-07 NOTE — Telephone Encounter (Signed)
I am ok not waiting for monitor results, as her symptoms are intermittent. Echo looked fine. Thank you.

## 2018-12-08 DIAGNOSIS — D638 Anemia in other chronic diseases classified elsewhere: Secondary | ICD-10-CM

## 2018-12-08 DIAGNOSIS — I10 Essential (primary) hypertension: Secondary | ICD-10-CM

## 2018-12-08 DIAGNOSIS — E785 Hyperlipidemia, unspecified: Secondary | ICD-10-CM

## 2018-12-08 LAB — CBC
HCT: 30.4 % — ABNORMAL LOW (ref 36.0–46.0)
Hemoglobin: 9.3 g/dL — ABNORMAL LOW (ref 12.0–15.0)
MCH: 26.7 pg (ref 26.0–34.0)
MCHC: 30.6 g/dL (ref 30.0–36.0)
MCV: 87.4 fL (ref 80.0–100.0)
PLATELETS: 289 10*3/uL (ref 150–400)
RBC: 3.48 MIL/uL — ABNORMAL LOW (ref 3.87–5.11)
RDW: 14.6 % (ref 11.5–15.5)
WBC: 18.3 10*3/uL — ABNORMAL HIGH (ref 4.0–10.5)
nRBC: 0 % (ref 0.0–0.2)

## 2018-12-08 LAB — BASIC METABOLIC PANEL
Anion gap: 9 (ref 5–15)
BUN: 9 mg/dL (ref 8–23)
CALCIUM: 9.1 mg/dL (ref 8.9–10.3)
CO2: 27 mmol/L (ref 22–32)
Chloride: 100 mmol/L (ref 98–111)
Creatinine, Ser: 0.72 mg/dL (ref 0.44–1.00)
GFR calc Af Amer: >60 mL/min (ref >60)
GFR calc non Af Amer: >60 mL/min (ref >60)
Glucose, Bld: 114 mg/dL — ABNORMAL HIGH (ref 70–99)
Potassium: 3.9 mmol/L (ref 3.5–5.1)
SODIUM: 136 mmol/L (ref 135–145)

## 2018-12-08 NOTE — Progress Notes (Signed)
PT Cancellation Note  Patient Details Name: Carla Dawson MRN: [id number] DOB: [birth year]   Cancelled Treatment:    Reason Eval/Treat Not Completed: Medical issues which prohibited therapy.  Pulse at rest was up to 155, down to 130 then returning to higher values.  Talked with nursing about the trends and will try again in the AM to evaluate.   Ramond Dial 12/08/2018, 3:35 PM   Mee Hives, PT MS Acute Rehab Dept. Number: [place]

## 2018-12-08 NOTE — Progress Notes (Addendum)
PROGRESS NOTE  Carla Dawson [id number]:[id number] DOB: 04-15-1931 DOA: 12/06/2018 PCP: Martinique, Betty G, MD   LOS: 1 day   Brief Narrative / Interim history: 84 year old female with history of COPD, hyperlipidemia, hypertension, peripheral arterial disease who presents to the hospital on 12/29 with progressive shortness of breath as well as worsening cough along with noted hypoxia into the mid 80s at home.  She was admitted with COPD exacerbation.  Subjective: Does not feel to go this morning, she is wheezing more.  No chest pain, no palpitations  Assessment & Plan: Principal Problem:   COPD exacerbation ([place]) Active Problems:   Essential hypertension, benign   PAD (peripheral artery disease) (HCC)   Neuropathy due to peripheral vascular disease (HCC)   Hyperlipidemia   Anemia of chronic disease   SIRS (systemic inflammatory response syndrome) (HCC)   Principal Problem Acute on chronic hypoxic respiratory failure due to COPD exacerbation -Continues to have persistent wheezing, continue current regimen with nebulizers, steroids, doxycycline.   -She has been feeling short of breath for a while at home suspect a degree of chronic hypoxia, will likely need home oxygen on discharge  Additional Problems Fever -Onset last night, possibly due to bronchitis versus nonspecific viral illness, her influenza is negative.  Blood cultures negative  Palpitations -Has a monitor in place as an outpatient, will place on telemetry here  Hypertension -Continue current regimen, blood pressure is stable  PAD -Continue aspirin and Plavix, no concerning symptoms at this point  Neuropathy due to peripheral vascular disease -Continue gabapentin  Hyperlipidemia -Continue Lipitor  Anemia of chronic disease -Hemoglobin stable, no evidence of bleeding  Scheduled Meds: . amLODipine  5 mg Oral QHS  . aspirin EC  81 mg Oral Daily  . atorvastatin  10 mg Oral q1800  . budesonide (PULMICORT)  nebulizer solution  0.25 mg Nebulization BID  . clopidogrel  75 mg Oral QHS  . doxycycline  100 mg Oral Q12H  . enoxaparin (LOVENOX) injection  40 mg Subcutaneous Q24H  . famotidine  20 mg Oral QHS  . ipratropium-albuterol  3 mL Nebulization TID  . loratadine  10 mg Oral Daily  . methylPREDNISolone (SOLU-MEDROL) injection  40 mg Intravenous Q12H  . umeclidinium bromide  2 puff Inhalation Daily   Continuous Infusions: PRN Meds:.albuterol, gabapentin, ibuprofen, ondansetron, senna-docusate  DVT prophylaxis: Lovenox Code Status: Full code Family Communication: No family present at bedside, discussed with daughter last night Disposition Plan: Home when ready  Consultants:   None  Procedures:   None   Antimicrobials:  Doxycycline 12/29 >>  Objective: Vitals:   12/08/18 0743 12/08/18 0826 12/08/18 0830 12/08/18 0831  BP: 140/68     Pulse: 100     Resp:      Temp: 98.9 F (37.2 C)     TempSrc: Oral     SpO2: 98% 98% 98% 98%  Weight:      Height:       No intake or output data in the 24 hours ending 12/08/18 1134 Filed Weights   12/06/18 0703  Weight: 50.8 kg    Examination:  Constitutional: Pursed lip breathing, audible wheezing Eyes: No icterus seen ENMT: Moist mucous membranes Neck: No masses Respiratory: End expiratory wheezing bilaterally, no crackles heard.  Tachypneic Cardiovascular: Regular rate and rhythm, SEM heard, no edema Abdomen: Soft, nontender, nondistended, positive bowel sounds Musculoskeletal: no clubbing / cyanosis.  Skin: No new rashes Neurologic: No focal deficits Psychiatric: Normal judgment and insight. Alert and oriented x 3. Normal  mood.    Data Reviewed: I have independently reviewed following labs and imaging studies   CBC: Recent Labs  Lab 12/06/18 0750 12/06/18 1237 12/07/18 0640 12/08/18 0739  WBC 27.0* 28.5* 23.4* 18.3*  HGB 9.9* 8.9* 8.6* 9.3*  HCT 33.5* 29.3* 28.0* 30.4*  MCV 88.9 89.3 88.1 87.4  PLT 284 251 258  035   Basic Metabolic Panel: Recent Labs  Lab 12/06/18 0750 12/06/18 1237 12/07/18 0640 12/08/18 0739  NA 139  --  140 136  K 3.8  --  4.0 3.9  CL 102  --  103 100  CO2 27  --  28 27  GLUCOSE 127*  --  102* 114*  BUN 13  --  12 9  CREATININE 0.90 0.90 0.74 0.72  CALCIUM 9.5  --  9.2 9.1   GFR: Estimated Creatinine Clearance: 38.8 mL/min (by C-G formula based on SCr of 0.72 mg/dL). Liver Function Tests: No results for input(s): AST, ALT, ALKPHOS, BILITOT, PROT, ALBUMIN in the last 168 hours. No results for input(s): LIPASE, AMYLASE in the last 168 hours. No results for input(s): AMMONIA in the last 168 hours. Coagulation Profile: No results for input(s): INR, PROTIME in the last 168 hours. Cardiac Enzymes: No results for input(s): CKTOTAL, CKMB, CKMBINDEX, TROPONINI in the last 168 hours. BNP (last 3 results) Recent Labs    11/25/18 1522  PROBNP 282.0*   HbA1C: No results for input(s): HGBA1C in the last 72 hours. CBG: No results for input(s): GLUCAP in the last 168 hours. Lipid Profile: No results for input(s): CHOL, HDL, LDLCALC, TRIG, CHOLHDL, LDLDIRECT in the last 72 hours. Thyroid Function Tests: No results for input(s): TSH, T4TOTAL, FREET4, T3FREE, THYROIDAB in the last 72 hours. Anemia Panel: No results for input(s): VITAMINB12, FOLATE, FERRITIN, TIBC, IRON, RETICCTPCT in the last 72 hours. Urine analysis: No results found for: COLORURINE, APPEARANCEUR, LABSPEC, PHURINE, GLUCOSEU, HGBUR, BILIRUBINUR, KETONESUR, PROTEINUR, UROBILINOGEN, NITRITE, LEUKOCYTESUR Sepsis Labs: Invalid input(s): PROCALCITONIN, LACTICIDVEN  Recent Results (from the past 240 hour(s))  Culture, blood (Routine X 2) w Reflex to ID Panel     Status: None (Preliminary result)   Collection Time: 12/07/18  5:05 PM  Result Value Ref Range Status   Specimen Description BLOOD LEFT HAND  Final   Special Requests   Final    BOTTLES DRAWN AEROBIC ONLY Blood Culture adequate volume   Culture    Final    NO GROWTH < 12 HOURS Performed at [facility], [address] [place], [place] [address]    Report Status PENDING  Incomplete  Culture, blood (Routine X 2) w Reflex to ID Panel     Status: None (Preliminary result)   Collection Time: 12/07/18  5:05 PM  Result Value Ref Range Status   Specimen Description BLOOD RIGHT ANTECUBITAL  Final   Special Requests   Final    BOTTLES DRAWN AEROBIC AND ANAEROBIC Blood Culture adequate volume   Culture   Final    NO GROWTH < 12 HOURS Performed at [facility], [place] [address] [place], [place] [address]    Report Status PENDING  Incomplete      Radiology Studies: No results found.  Marzetta Board, MD, PhD [facility] Pager [phone]  If 7PM-7AM, please contact night-coverage [email or web address] Password [email or web address] 12/08/2018, 11:34 AM

## 2018-12-09 LAB — CBC
HCT: 30.2 % — ABNORMAL LOW (ref 36.0–46.0)
Hemoglobin: 9.2 g/dL — ABNORMAL LOW (ref 12.0–15.0)
MCH: 25.9 pg — ABNORMAL LOW (ref 26.0–34.0)
MCHC: 30.5 g/dL (ref 30.0–36.0)
MCV: 85.1 fL (ref 80.0–100.0)
Platelets: 284 10*3/uL (ref 150–400)
RBC: 3.55 MIL/uL — ABNORMAL LOW (ref 3.87–5.11)
RDW: 13.9 % (ref 11.5–15.5)
WBC: 15.8 10*3/uL — ABNORMAL HIGH (ref 4.0–10.5)
nRBC: 0 % (ref 0.0–0.2)

## 2018-12-09 MED ORDER — DILTIAZEM HCL-DEXTROSE 100-5 MG/100ML-% IV SOLN (PREMIX): 5.0000 mg/h | INTRAVENOUS | Status: DC

## 2018-12-09 MED ORDER — IPRATROPIUM BROMIDE 0.02 % IN SOLN
0.5000 mg | Freq: Three times a day (TID) | RESPIRATORY_TRACT | Status: DC
  Filled 2018-12-09: qty 2.5

## 2018-12-09 MED ORDER — LEVALBUTEROL HCL 0.63 MG/3ML IN NEBU
0.6300 mg | INHALATION_SOLUTION | Freq: Three times a day (TID) | RESPIRATORY_TRACT | Status: DC
  Filled 2018-12-09: qty 3

## 2018-12-09 MED ORDER — DILTIAZEM HCL 25 MG/5ML IV SOLN
10.0000 mg | Freq: Once | INTRAVENOUS | Status: AC
  Administered 2018-12-09: 10 mg via INTRAVENOUS
  Filled 2018-12-09: qty 5

## 2018-12-09 MED ORDER — IPRATROPIUM BROMIDE 0.02 % IN SOLN
0.5000 mg | Freq: Three times a day (TID) | RESPIRATORY_TRACT | Status: DC
  Administered 2018-12-09 (×2): 0.5 mg via RESPIRATORY_TRACT
  Filled 2018-12-09 (×2): qty 2.5

## 2018-12-09 MED ORDER — LEVALBUTEROL HCL 0.63 MG/3ML IN NEBU
0.6300 mg | INHALATION_SOLUTION | Freq: Three times a day (TID) | RESPIRATORY_TRACT | Status: DC
  Administered 2018-12-09 (×2): 0.63 mg via RESPIRATORY_TRACT
  Filled 2018-12-09 (×2): qty 3

## 2018-12-09 MED ORDER — DILTIAZEM HCL 30 MG PO TABS
30.0000 mg | ORAL_TABLET | Freq: Three times a day (TID) | ORAL | Status: DC
  Administered 2018-12-09 – 2018-12-10 (×5): 30 mg via ORAL
  Filled 2018-12-09 (×5): qty 1

## 2018-12-09 MED ORDER — SODIUM CHLORIDE 0.9 % IV BOLUS: 500.0000 mL | Freq: Once | INTRAVENOUS | Status: DC

## 2018-12-09 MED ORDER — LEVALBUTEROL HCL 0.63 MG/3ML IN NEBU: 0.6300 mg | INHALATION_SOLUTION | Freq: Four times a day (QID) | RESPIRATORY_TRACT | Status: DC | PRN

## 2018-12-09 MED ORDER — PREDNISONE 20 MG PO TABS
50.0000 mg | ORAL_TABLET | Freq: Every day | ORAL | Status: DC
  Administered 2018-12-10 – 2018-12-11 (×2): 50 mg via ORAL
  Filled 2018-12-09 (×2): qty 2

## 2018-12-09 MED ORDER — SODIUM CHLORIDE 0.9 % IV BOLUS
250.0000 mL | Freq: Once | INTRAVENOUS | Status: AC
  Administered 2018-12-09: 250 mL via INTRAVENOUS

## 2018-12-09 NOTE — Progress Notes (Signed)
PROGRESS NOTE  Carla Dawson [id number] DOB: 05/05/1931 DOA: 12/06/2018 PCP: Martin[place], Betty G, MD   LOS: 2 days   Brief Narrative / Interim history: 85 year old female with history of COPD, hyperlipidemia, hypertension, peripheral arterial disease who presents to the hospital on 12/29 with progressive shortness of breath as well as worsening cough along with noted hypoxia into the mid 80s at home.  She was admitted with COPD exacerbation.  Subjective: Had A. fib with RVR this morning.  No nausea no vomiting.  Paroxysmal.  Now back in sinus rhythm.  No chest pain or palpitation no fever no chills.  Breathing is about the same.  Slept good last night.  Still has a lot of cough.  Does not like the feeling that she gets after breathing treatments.  Assessment & Plan: Principal Problem:   COPD exacerbation ([place]) Active Problems:   Essential hypertension, benign   PAD (peripheral artery disease) (HCC)   Neuropathy due to peripheral vascular disease (HCC)   Hyperlipidemia   Anemia of chronic disease   SIRS (systemic inflammatory response syndrome) (HCC)   Principal Problem Acute on chronic hypoxic respiratory failure due to COPD exacerbation -Continues to have persistent wheezing, continue current regimen with nebulizers, steroids, doxycycline.   -She has been feeling short of breath for a while at home suspect a degree of chronic hypoxia, will likely need home oxygen on discharge  Additional Problems Fever possibly due to bronchitis versus nonspecific viral illness, her influenza is negative.  Blood cultures negative  A. fib with RVR. Paroxysmal. Now normal sinus rhythm. -Change Norvasc to Cardizem.  Patient was given Cardizem bolus. We will also change her nebulizing therapy to Xopenex. We will discuss anticoagulation with the patient.  Hypertension -Continue current regimen, blood pressure is stable  PAD -Continue aspirin and Plavix, no concerning symptoms at  this point  Neuropathy due to peripheral vascular disease -Continue gabapentin  Hyperlipidemia -Continue Lipitor  Anemia of chronic disease -Hemoglobin stable, no evidence of bleeding  Scheduled Meds: . aspirin EC  81 mg Oral Daily  . atorvastatin  10 mg Oral q1800  . budesonide (PULMICORT) nebulizer solution  0.25 mg Nebulization BID  . clopidogrel  75 mg Oral QHS  . diltiazem  30 mg Oral TID PC & HS  . doxycycline  100 mg Oral Q12H  . enoxaparin (LOVENOX) injection  40 mg Subcutaneous Q24H  . famotidine  20 mg Oral QHS  . ipratropium  0.5 mg Nebulization Q8H  . levalbuterol  0.63 mg Nebulization Q8H  . loratadine  10 mg Oral Daily  . [START ON 12/10/2018] predniSONE  50 mg Oral Q breakfast  . umeclidinium bromide  2 puff Inhalation Daily   Continuous Infusions: PRN Meds:.gabapentin, ondansetron, senna-docusate  DVT prophylaxis: Lovenox Code Status: Full code Family Communication: No family present at bedside, discussed with daughter last night Disposition Plan: Home when ready  Consultants:   None  Procedures:   None   Antimicrobials:  Doxycycline 12/29 >>  Objective: Vitals:   12/09/18 0852 12/09/18 1021 12/09/18 1328 12/09/18 1410  BP: (!) 142/78 136/65  135/71  Pulse:  (!) 102 98   Resp:   18   Temp:      TempSrc:      SpO2:  100% 97%   Weight:      Height:        Intake/Output Summary (Last 24 hours) at 12/09/2018 1605 Last data filed at 12/09/2018 1200 Gross per 24 hour  Intake 480 ml  Output -  Net 480 ml   Filed Weights   12/06/18 0703  Weight: 50.8 kg    Examination:  Constitutional: Pursed lip breathing, audible wheezing Eyes: No icterus seen ENMT: Moist mucous membranes Neck: No masses Respiratory: End expiratory wheezing bilaterally, no crackles heard.  Tachypneic Cardiovascular: Regular rate and rhythm, SEM heard, no edema Abdomen: Soft, nontender, nondistended, positive bowel sounds Musculoskeletal: no clubbing / cyanosis.    Skin: No new rashes Neurologic: No focal deficits Psychiatric: Normal judgment and insight. Alert and oriented x 3. Normal mood.    Data Reviewed: I have independently reviewed following labs and imaging studies   CBC: Recent Labs  Lab 12/06/18 0750 12/06/18 1237 12/07/18 0640 12/08/18 0739 12/09/18 0303  WBC 27.0* 28.5* 23.4* 18.3* 15.8*  HGB 9.9* 8.9* 8.6* 9.3* 9.2*  HCT 33.5* 29.3* 28.0* 30.4* 30.2*  MCV 88.9 89.3 88.1 87.4 85.1  PLT 284 251 258 289 474   Basic Metabolic Panel: Recent Labs  Lab 12/06/18 0750 12/06/18 1237 12/07/18 0640 12/08/18 0739  NA 139  --  140 136  K 3.8  --  4.0 3.9  CL 102  --  103 100  CO2 27  --  28 27  GLUCOSE 127*  --  102* 114*  BUN 13  --  12 9  CREATININE 0.90 0.90 0.74 0.72  CALCIUM 9.5  --  9.2 9.1   GFR: Estimated Creatinine Clearance: 38.8 mL/min (by C-G formula based on SCr of 0.72 mg/dL). Liver Function Tests: No results for input(s): AST, ALT, ALKPHOS, BILITOT, PROT, ALBUMIN in the last 168 hours. No results for input(s): LIPASE, AMYLASE in the last 168 hours. No results for input(s): AMMONIA in the last 168 hours. Coagulation Profile: No results for input(s): INR, PROTIME in the last 168 hours. Cardiac Enzymes: No results for input(s): CKTOTAL, CKMB, CKMBINDEX, TROPONINI in the last 168 hours. BNP (last 3 results) Recent Labs    11/25/18 1522  PROBNP 282.0*   HbA1C: No results for input(s): HGBA1C in the last 72 hours. CBG: No results for input(s): GLUCAP in the last 168 hours. Lipid Profile: No results for input(s): CHOL, HDL, LDLCALC, TRIG, CHOLHDL, LDLDIRECT in the last 72 hours. Thyroid Function Tests: No results for input(s): TSH, T4TOTAL, FREET4, T3FREE, THYROIDAB in the last 72 hours. Anemia Panel: No results for input(s): VITAMINB12, FOLATE, FERRITIN, TIBC, IRON, RETICCTPCT in the last 72 hours. Urine analysis: No results found for: COLORURINE, APPEARANCEUR, LABSPEC, PHURINE, GLUCOSEU, HGBUR,  BILIRUBINUR, KETONESUR, PROTEINUR, UROBILINOGEN, NITRITE, LEUKOCYTESUR Sepsis Labs: Invalid input(s): PROCALCITONIN, LACTICIDVEN  Recent Results (from the past 240 hour(s))  Culture, blood (Routine X 2) w Reflex to ID Panel     Status: None (Preliminary result)   Collection Time: 12/07/18  5:05 PM  Result Value Ref Range Status   Specimen Description BLOOD LEFT HAND  Final   Special Requests   Final    BOTTLES DRAWN AEROBIC ONLY Blood Culture adequate volume   Culture   Final    NO GROWTH 2 DAYS Performed at [facility], [address], [place], [place] [address]    Report Status PENDING  Incomplete  Culture, blood (Routine X 2) w Reflex to ID Panel     Status: None (Preliminary result)   Collection Time: 12/07/18  5:05 PM  Result Value Ref Range Status   Specimen Description BLOOD RIGHT ANTECUBITAL  Final   Special Requests   Final    BOTTLES DRAWN AEROBIC AND ANAEROBIC Blood Culture adequate volume  Culture   Final    NO GROWTH 2 DAYS Performed at [facility], [place] [address], [place], [place] [address]    Report Status PENDING  Incomplete      Radiology Studies: No results found.  Author:  Berle Mull, MD [facility] Pager: [phone] 12/09/2018 4:10 PM     If 7PM-7AM, please contact night-coverage [email or web address] Password [email or web address] 12/09/2018, 4:05 PM

## 2018-12-09 NOTE — Progress Notes (Addendum)
0830 Per tele call pt HR>160. Pt asymptomatic except for "fluttering in chest." EKG performed stating A-Fib w/ rvr. BP 155/74. O2 sats 100%. Pt did have albuterol tx at 0729. Patel MD paged.  0845 BP now 114/62. HR remains 160. No orders received at this point. Patel re-paged with new BP.

## 2018-12-09 NOTE — Evaluation (Signed)
Physical Therapy Evaluation Patient Details Name: Carla Dawson MRN: [id number] DOB: [birth year] Today's Date: 12/09/2018   History of Present Illness  85 year old female with history of COPD, hyperlipidemia, hypertension, peripheral arterial disease who presents to the hospital on 12/29 with progressive shortness of breath as well as worsening cough along with noted hypoxia into the mid 80s at home.  She was admitted with COPD exacerbation     Clinical Impression  Pt admitted with above diagnosis. Pt currently with functional limitations due to the deficits listed below (see PT Problem List). PTA, pt living with daughter and independent with mobility/ADLs, no assistive device or home O2. Today, patient supervision level for transfer, min guard for ambulation short distances with HR 85-115 during visit. Spo2 88% on RA with light activity, satting well at rest.  Pt will benefit from skilled PT to increase their independence and safety with mobility to allow discharge to the venue listed below.       Follow Up Recommendations Home health PT;Supervision for mobility/OOB    Equipment Recommendations  None recommended by PT    Recommendations for Other Services       Precautions / Restrictions Precautions Precautions: Fall      Mobility  Bed Mobility Overal bed mobility: Modified Independent                Transfers Overall transfer level: Needs assistance Equipment used: None Transfers: Sit to/from Stand Sit to Stand: Supervision            Ambulation/Gait Ambulation/Gait assistance: Min guard Gait Distance (Feet): 20 Feet Assistive device: None;Rolling walker (2 wheeled) Gait Pattern/deviations: Step-to pattern;Step-through pattern Gait velocity: decreased   General Gait Details: pt ambulating with unsteadiness, reports she is not normally imbalanced at baseline. HR 85-115 with walking to door and back x2, safer with RW at this time.   Stairs             Wheelchair Mobility    Modified Rankin (Stroke Patients Only)       Balance Overall balance assessment: Needs assistance   Sitting balance-Leahy Scale: Good       Standing balance-Leahy Scale: Fair                               Pertinent Vitals/Pain Pain Assessment: No/denies pain    Home Living Family/patient expects to be discharged to:: Private residence Living Arrangements: Children Available Help at Discharge: Family;Available 24 hours/day Type of Home: House Home Access: Stairs to enter Entrance Stairs-Rails: Right Entrance Stairs-Number of Steps: 3-4 Home Layout: One level Home Equipment: Walker - 4 wheels;Shower seat;Hand held shower head;Wheelchair - manual      Prior Function Level of Independence: Independent               Hand Dominance   Dominant Hand: Right    Extremity/Trunk Assessment   Upper Extremity Assessment Upper Extremity Assessment: Overall WFL for tasks assessed    Lower Extremity Assessment Lower Extremity Assessment: Overall WFL for tasks assessed       Communication   Communication: HOH  Cognition Arousal/Alertness: Awake/alert Behavior During Therapy: WFL for tasks assessed/performed                                          General Comments      Exercises  Assessment/Plan    PT Assessment Patient needs continued PT services  PT Problem List Decreased strength       PT Treatment Interventions DME instruction;Gait training;Stair training;Functional mobility training;Therapeutic activities;Therapeutic exercise    PT Goals (Current goals can be found in the Care Plan section)  Acute Rehab PT Goals Patient Stated Goal: go home when ready PT Goal Formulation: With patient Potential to Achieve Goals: Good    Frequency Min 3X/week   Barriers to discharge        Co-evaluation               AM-PAC PT "6 Clicks" Mobility  Outcome Measure Help needed turning  from your back to your side while in a flat bed without using bedrails?: None Help needed moving from lying on your back to sitting on the side of a flat bed without using bedrails?: None Help needed moving to and from a bed to a chair (including a wheelchair)?: A Little Help needed standing up from a chair using your arms (e.g., wheelchair or bedside chair)?: A Little Help needed to walk in hospital room?: A Little Help needed climbing 3-5 steps with a railing? : A Lot 6 Click Score: 19    End of Session Equipment Utilized During Treatment: Gait belt Activity Tolerance: Patient tolerated treatment well Patient left: in bed;with call bell/phone within reach Nurse Communication: Mobility status PT Visit Diagnosis: Unsteadiness on feet (R26.81)    Time: 4920-1007 PT Time Calculation (min) (ACUTE ONLY): 25 min   Charges:   PT Evaluation $PT Eval Low Complexity: 1 Low PT Treatments $Gait Training: 8-22 mins       Reinaldo Berber, PT, DPT Acute Rehabilitation Services Pager: [phone] Office: [place] 12/09/2018, 1:05 PM

## 2018-12-09 NOTE — Plan of Care (Signed)
Reviewed with pt and family new heart complications (a fib, RVR), medication changes made by provider, and plan.  Pt verbalized understanding.

## 2018-12-10 LAB — BASIC METABOLIC PANEL
Anion gap: 7 (ref 5–15)
BUN: 17 mg/dL (ref 8–23)
CHLORIDE: 102 mmol/L (ref 98–111)
CO2: 31 mmol/L (ref 22–32)
Calcium: 9.4 mg/dL (ref 8.9–10.3)
Creatinine, Ser: 0.88 mg/dL (ref 0.44–1.00)
GFR calc Af Amer: >60 mL/min (ref >60)
GFR calc non Af Amer: 60 mL/min — ABNORMAL LOW (ref >60)
Glucose, Bld: 93 mg/dL (ref 70–99)
Potassium: 4 mmol/L (ref 3.5–5.1)
Sodium: 140 mmol/L (ref 135–145)

## 2018-12-10 LAB — CBC
HCT: 29.6 % — ABNORMAL LOW (ref 36.0–46.0)
Hemoglobin: 9.2 g/dL — ABNORMAL LOW (ref 12.0–15.0)
MCH: 27 pg (ref 26.0–34.0)
MCHC: 31.1 g/dL (ref 30.0–36.0)
MCV: 86.8 fL (ref 80.0–100.0)
Platelets: 378 10*3/uL (ref 150–400)
RBC: 3.41 MIL/uL — ABNORMAL LOW (ref 3.87–5.11)
RDW: 14 % (ref 11.5–15.5)
WBC: 17.1 10*3/uL — ABNORMAL HIGH (ref 4.0–10.5)
nRBC: 0 % (ref 0.0–0.2)

## 2018-12-10 LAB — MAGNESIUM: MAGNESIUM: 2.2 mg/dL (ref 1.7–2.4)

## 2018-12-10 MED ORDER — SODIUM CHLORIDE 0.9% FLUSH
3.0000 mL | Freq: Two times a day (BID) | INTRAVENOUS | Status: DC
  Administered 2018-12-10 – 2018-12-11 (×3): 3 mL via INTRAVENOUS

## 2018-12-10 MED ORDER — DILTIAZEM HCL ER 90 MG PO CP12
90.0000 mg | ORAL_CAPSULE | Freq: Two times a day (BID) | ORAL | Status: DC
  Administered 2018-12-10 (×2): 90 mg via ORAL
  Filled 2018-12-10 (×3): qty 1

## 2018-12-10 MED ORDER — SODIUM CHLORIDE 0.9% FLUSH: 3.0000 mL | INTRAVENOUS | Status: DC | PRN

## 2018-12-10 MED ORDER — DEXTROMETHORPHAN POLISTIREX ER 30 MG/5ML PO SUER
30.0000 mg | Freq: Two times a day (BID) | ORAL | Status: DC
  Administered 2018-12-10 – 2018-12-11 (×3): 30 mg via ORAL
  Filled 2018-12-10 (×4): qty 5

## 2018-12-10 MED ORDER — MOMETASONE FURO-FORMOTEROL FUM 100-5 MCG/ACT IN AERO
2.0000 | INHALATION_SPRAY | Freq: Two times a day (BID) | RESPIRATORY_TRACT | Status: DC
  Administered 2018-12-11: 2 via RESPIRATORY_TRACT
  Filled 2018-12-10: qty 8.8

## 2018-12-10 MED ORDER — DILTIAZEM HCL ER 90 MG PO CP12
90.0000 mg | ORAL_CAPSULE | Freq: Two times a day (BID) | ORAL | Status: DC
  Filled 2018-12-10: qty 1

## 2018-12-10 MED ORDER — SALINE SPRAY 0.65 % NA SOLN
1.0000 | NASAL | Status: DC | PRN
  Administered 2018-12-10: 1 via NASAL
  Filled 2018-12-10 (×2): qty 44

## 2018-12-10 NOTE — Care Management Note (Addendum)
Case Management Note  Patient Details  Name: Alicianna Litchford MRN: [id number] Date of Birth: June 09, 1931  Subjective/Objective:   Pt admitted with COPD excerbation                Action/Plan:  PTA independent from home with daughter.  Pt has a walker and wheelchair in the home when needed.  Pt informed CM that she has been prescribed oxygen in the past but was not requiring it PTA - no oxygen equipment in the home currently.  Pt has PCP.  Pt interested in [place] provided medicare.gov list for pt/daughter to review.  CM will follow up   Expected Discharge Date:                  Expected Discharge Plan:  [place]  In-House Referral:     Discharge planning Services  CM Consult  Post Acute Care Choice:    Choice offered to:     DME Arranged:    DME Agency:     HH Arranged:    [place] Agency:     Status of Service:  In process, will continue to follow  If discussed at Long Length of Stay Meetings, dates discussed:    Additional Comments:  Maryclare Labrador, RN 12/10/2018, 12:51 PM

## 2018-12-10 NOTE — Care Management Important Message (Signed)
Important Message  Patient Details  Name: Carla Dawson MRN: [id number] Date of Birth: 10-Jul-1931   Medicare Important Message Given:  Yes    Orbie Pyo 12/10/2018, 2:56 PM

## 2018-12-10 NOTE — Progress Notes (Signed)
PROGRESS NOTE  Akirah Storck FXT:[id number] DOB: 10-10-1931 DOA: 12/06/2018 PCP: [place], Betty G, MD   LOS: 3 days   Brief Narrative / Interim history: 85 year old female with history of COPD, hyperlipidemia, hypertension, peripheral arterial disease who presents to the hospital on 12/29 with progressive shortness of breath as well as worsening cough along with noted hypoxia into the mid 80s at home.  She was admitted with COPD exacerbation.  Subjective: Reports that she has thick nasal secretions.  Cough is still present.  Breathing is still heavy.  No nausea no vomiting.  Oral intake is improving.  Anxiety is improving as well.  Assessment & Plan: Principal Problem:   COPD exacerbation ([place]) Active Problems:   Essential hypertension, benign   PAD (peripheral artery disease) (HCC)   Neuropathy due to peripheral vascular disease (HCC)   Hyperlipidemia   Anemia of chronic disease   SIRS (systemic inflammatory response syndrome) (HCC)   Principal Problem Acute on chronic hypoxic respiratory failure due to COPD exacerbation -Continues to have persistent wheezing, continue current regimen with nebulizers, steroids, doxycycline.   -She has been feeling short of breath for a while at home suspect a degree of chronic hypoxia, will likely need home oxygen on discharge  A. fib with RVR. Paroxysmal. Now normal sinus rhythm. -Change Norvasc to Cardizem.  Patient was given Cardizem bolus. Currently the patient is in sinus rhythm. Suspect yesterday's event was secondary to respiratory distress as well as nebulizing therapy. Patient has just submitted her Holter monitoring. No CV strips are saved from yesterday's event for A. fib. We will continue with aspirin and Plavix.  Patient actually has a surgery scheduled later this month. Recommend outpatient follow-up with cardiology.  Hypertension -Continue current regimen, blood pressure is stable  PAD -Continue aspirin and  Plavix, no concerning symptoms at this point  Neuropathy due to peripheral vascular disease -Continue gabapentin  Hyperlipidemia -Continue Lipitor  Anemia of chronic disease -Hemoglobin stable, no evidence of bleeding  Scheduled Meds: . aspirin EC  81 mg Oral Daily  . atorvastatin  10 mg Oral q1800  . clopidogrel  75 mg Oral QHS  . dextromethorphan  30 mg Oral BID  . diltiazem  90 mg Oral Q12H  . enoxaparin (LOVENOX) injection  40 mg Subcutaneous Q24H  . famotidine  20 mg Oral QHS  . loratadine  10 mg Oral Daily  . mometasone-formoterol  2 puff Inhalation BID  . predniSONE  50 mg Oral Q breakfast  . sodium chloride flush  3 mL Intravenous Q12H  . umeclidinium bromide  2 puff Inhalation Daily   Continuous Infusions: PRN Meds:.gabapentin, levalbuterol, ondansetron, senna-docusate, sodium chloride, sodium chloride flush  DVT prophylaxis: Lovenox Code Status: Full code Family Communication: No family present at bedside, discussed with daughter last night Disposition Plan: Home when ready  Consultants:   None  Procedures:   None   Antimicrobials:  Doxycycline 12/29 >>  Objective: Vitals:   12/09/18 1935 12/09/18 2101 12/10/18 0353 12/10/18 0749  BP:  136/80 (!) 144/59 137/69  Pulse: 94 80 66 89  Resp: 18  14   Temp:  98.4 F (36.9 C) 98.2 F (36.8 C) 97.9 F (36.6 C)  TempSrc:  Oral Oral Oral  SpO2: 100% 94% 100% 100%  Weight:      Height:        Intake/Output Summary (Last 24 hours) at 12/10/2018 1315 Last data filed at 12/10/2018 0500 Gross per 24 hour  Intake -  Output 950 ml  Net -  950 ml   Filed Weights   12/06/18 0703  Weight: 50.8 kg    Examination:  Constitutional: Pursed lip breathing, audible wheezing Eyes: No icterus seen ENMT: Moist mucous membranes Neck: No masses Respiratory: End expiratory wheezing bilaterally, no crackles heard.  Tachypneic Cardiovascular: Regular rate and rhythm, SEM heard, no edema Abdomen: Soft, nontender,  nondistended, positive bowel sounds Musculoskeletal: no clubbing / cyanosis.  Skin: No new rashes Neurologic: No focal deficits Psychiatric: Normal judgment and insight. Alert and oriented x 3. Normal mood.    Data Reviewed: I have independently reviewed following labs and imaging studies   CBC: Recent Labs  Lab 12/06/18 1237 12/07/18 0640 12/08/18 0739 12/09/18 0303 12/10/18 0226  WBC 28.5* 23.4* 18.3* 15.8* 17.1*  HGB 8.9* 8.6* 9.3* 9.2* 9.2*  HCT 29.3* 28.0* 30.4* 30.2* 29.6*  MCV 89.3 88.1 87.4 85.1 86.8  PLT 251 258 289 284 412   Basic Metabolic Panel: Recent Labs  Lab 12/06/18 0750 12/06/18 1237 12/07/18 0640 12/08/18 0739 12/10/18 0226  NA 139  --  140 136 140  K 3.8  --  4.0 3.9 4.0  CL 102  --  103 100 102  CO2 27  --  28 27 31   GLUCOSE 127*  --  102* 114* 93  BUN 13  --  12 9 17   CREATININE 0.90 0.90 0.74 0.72 0.88  CALCIUM 9.5  --  9.2 9.1 9.4  MG  --   --   --   --  2.2   GFR: Estimated Creatinine Clearance: 35.3 mL/min (by C-G formula based on SCr of 0.88 mg/dL). Liver Function Tests: No results for input(s): AST, ALT, ALKPHOS, BILITOT, PROT, ALBUMIN in the last 168 hours. No results for input(s): LIPASE, AMYLASE in the last 168 hours. No results for input(s): AMMONIA in the last 168 hours. Coagulation Profile: No results for input(s): INR, PROTIME in the last 168 hours. Cardiac Enzymes: No results for input(s): CKTOTAL, CKMB, CKMBINDEX, TROPONINI in the last 168 hours. BNP (last 3 results) Recent Labs    11/25/18 1522  PROBNP 282.0*   HbA1C: No results for input(s): HGBA1C in the last 72 hours. CBG: No results for input(s): GLUCAP in the last 168 hours. Lipid Profile: No results for input(s): CHOL, HDL, LDLCALC, TRIG, CHOLHDL, LDLDIRECT in the last 72 hours. Thyroid Function Tests: No results for input(s): TSH, T4TOTAL, FREET4, T3FREE, THYROIDAB in the last 72 hours. Anemia Panel: No results for input(s): VITAMINB12, FOLATE, FERRITIN,  TIBC, IRON, RETICCTPCT in the last 72 hours. Urine analysis: No results found for: COLORURINE, APPEARANCEUR, LABSPEC, PHURINE, GLUCOSEU, HGBUR, BILIRUBINUR, KETONESUR, PROTEINUR, UROBILINOGEN, NITRITE, LEUKOCYTESUR Sepsis Labs: Invalid input(s): PROCALCITONIN, LACTICIDVEN  Recent Results (from the past 240 hour(s))  Culture, blood (Routine X 2) w Reflex to ID Panel     Status: None (Preliminary result)   Collection Time: 12/07/18  5:05 PM  Result Value Ref Range Status   Specimen Description BLOOD LEFT HAND  Final   Special Requests   Final    BOTTLES DRAWN AEROBIC ONLY Blood Culture adequate volume   Culture   Final    NO GROWTH 2 DAYS Performed at [facility], [address], [place], [place] [address]    Report Status PENDING  Incomplete  Culture, blood (Routine X 2) w Reflex to ID Panel     Status: None (Preliminary result)   Collection Time: 12/07/18  5:05 PM  Result Value Ref Range Status   Specimen Description BLOOD RIGHT ANTECUBITAL  Final   Special Requests   Final    BOTTLES DRAWN AEROBIC AND ANAEROBIC Blood Culture adequate volume   Culture   Final    NO GROWTH 2 DAYS Performed at [facility], [address] [place], [place] [address]    Report Status PENDING  Incomplete      Radiology Studies: No results found.  Author:  Berle Mull, MD [facility] Pager: [phone] 12/10/2018 1:15 PM     If 7PM-7AM, please contact night-coverage [email or web address] Password [email or web address] 12/10/2018, 1:15 PM

## 2018-12-11 LAB — CBC
HCT: 33.1 % — ABNORMAL LOW (ref 36.0–46.0)
HEMOGLOBIN: 10.1 g/dL — AB (ref 12.0–15.0)
MCH: 26.4 pg (ref 26.0–34.0)
MCHC: 30.5 g/dL (ref 30.0–36.0)
MCV: 86.4 fL (ref 80.0–100.0)
Platelets: 441 10*3/uL — ABNORMAL HIGH (ref 150–400)
RBC: 3.83 MIL/uL — ABNORMAL LOW (ref 3.87–5.11)
RDW: 13.9 % (ref 11.5–15.5)
WBC: 12.7 10*3/uL — ABNORMAL HIGH (ref 4.0–10.5)
nRBC: 0 % (ref 0.0–0.2)

## 2018-12-11 LAB — BASIC METABOLIC PANEL
Anion gap: 6 (ref 5–15)
BUN: 16 mg/dL (ref 8–23)
CO2: 34 mmol/L — ABNORMAL HIGH (ref 22–32)
CREATININE: 0.71 mg/dL (ref 0.44–1.00)
Calcium: 9.8 mg/dL (ref 8.9–10.3)
Chloride: 101 mmol/L (ref 98–111)
GFR calc Af Amer: >60 mL/min (ref >60)
GFR calc non Af Amer: >60 mL/min (ref >60)
Glucose, Bld: 107 mg/dL — ABNORMAL HIGH (ref 70–99)
Potassium: 4.8 mmol/L (ref 3.5–5.1)
Sodium: 141 mmol/L (ref 135–145)

## 2018-12-11 MED ORDER — SALINE SPRAY 0.65 % NA SOLN: 1.0000 | NASAL | 0 refills | Status: AC | PRN

## 2018-12-11 MED ORDER — LEVALBUTEROL TARTRATE 45 MCG/ACT IN AERO: 1.0000 | INHALATION_SPRAY | RESPIRATORY_TRACT | 0 refills | Status: DC | PRN

## 2018-12-11 MED ORDER — DILTIAZEM HCL ER COATED BEADS 180 MG PO CP24: 180.0000 mg | ORAL_CAPSULE | Freq: Every day | ORAL | 0 refills | Status: DC

## 2018-12-11 MED ORDER — PREDNISONE 10 MG PO TABS: ORAL_TABLET | ORAL | 0 refills | Status: DC

## 2018-12-11 MED ORDER — DILTIAZEM HCL ER COATED BEADS 180 MG PO CP24
180.0000 mg | ORAL_CAPSULE | Freq: Every day | ORAL | Status: DC
  Administered 2018-12-11: 180 mg via ORAL
  Filled 2018-12-11: qty 1

## 2018-12-11 MED ORDER — NYSTATIN 100000 UNIT/ML MT SUSP
5.0000 mL | Freq: Four times a day (QID) | OROMUCOSAL | Status: DC
  Administered 2018-12-11: 500000 [IU] via ORAL
  Filled 2018-12-11: qty 5

## 2018-12-11 MED ORDER — NYSTATIN 100000 UNIT/ML MT SUSP: 5.0000 mL | Freq: Four times a day (QID) | OROMUCOSAL | 0 refills | Status: AC

## 2018-12-11 NOTE — Progress Notes (Signed)
SATURATION QUALIFICATIONS: (This note is used to comply with regulatory documentation for home oxygen)  Patient Saturations on Room Air at Rest = 97%  Patient Saturations on Room Air while Ambulating = 88 %  Patient Saturations on 3L Liters of oxygen while Ambulating = 92%  Please briefly explain why patient needs home oxygen: Patient saturates well at rests but desats very quickly with exertion.

## 2018-12-11 NOTE — Progress Notes (Signed)
Physical Therapy Treatment Patient Details Name: Carla Dawson MRN: [id number] DOB: September 11, 1931 Today's Date: 12/11/2018    History of Present Illness 85 year old female with history of COPD, hyperlipidemia, hypertension, peripheral arterial disease who presents to the hospital on 12/29 with progressive shortness of breath as well as worsening cough along with noted hypoxia into the mid 80s at home.  She was admitted with COPD exacerbation    PT Comments    Pt admitted with above diagnosis. Pt currently with functional limitations due to the deficits listed below (see PT Problem List). Pt was able to ambulate with RW with good stability and was able to perform ADLs without assist.  Pt does have DOE 3/4 in which we discussed energy conservation and pursed lip breathing.  Daughter present and pt,PT and daughter discussed home with max HH services as daughter will be going out of town in a week.  Daughter and pt feel that pt will be able to stay alone for a few days in a week.  Daughter to ask MD about Pulmonary Rehab and if this would benefit pt.   Pt will benefit from skilled PT to increase their independence and safety with mobility to allow discharge to the venue listed below.     Follow Up Recommendations  Home health PT;Supervision for mobility/OOB(HHOT, HHaide, HHRN, HHSW)Pulmonary Rehab c/s.     Equipment Recommendations  Other (comment)(home O2)    Recommendations for Other Services       Precautions / Restrictions Precautions Precautions: Fall Restrictions Weight Bearing Restrictions: No    Mobility  Bed Mobility Overal bed mobility: Modified Independent                Transfers Overall transfer level: Needs assistance Equipment used: Rolling walker (2 wheeled) Transfers: Sit to/from Stand Sit to Stand: Supervision            Ambulation/Gait Ambulation/Gait assistance: Min guard Gait Distance (Feet): 40 Feet(20 feet x 2) Assistive device: Rolling  walker (2 wheeled) Gait Pattern/deviations: Step-to pattern;Step-through pattern;Trunk flexed Gait velocity: decreased Gait velocity interpretation: <1.31 ft/sec, indicative of household ambulator General Gait Details: pt ambulating to bathroom and back to bed with good steady gait with RW.  Pt did rush back to bed when she was getting fatigued but still with good safety.  Uses RW at home.  Pt sats on 2L 95% and greater.  DOE 3/4 at end of walk.  Talked about energy conservation.     Stairs             Wheelchair Mobility    Modified Rankin (Stroke Patients Only)       Balance Overall balance assessment: Needs assistance Sitting-balance support: No upper extremity supported;Feet supported Sitting balance-Leahy Scale: Good     Standing balance support: During functional activity;Bilateral upper extremity supported Standing balance-Leahy Scale: Poor Standing balance comment: Pt can stand statically without device to pull down panties and pull them up.  She can also stand to wash hands at sink.                             Cognition Arousal/Alertness: Awake/alert Behavior During Therapy: WFL for tasks assessed/performed Overall Cognitive Status: Within Functional Limits for tasks assessed                                        Exercises  General Comments        Pertinent Vitals/Pain Pain Assessment: No/denies pain    Home Living                      Prior Function            PT Goals (current goals can now be found in the care plan section) Acute Rehab PT Goals Patient Stated Goal: go home when ready Progress towards PT goals: Progressing toward goals    Frequency    Min 3X/week      PT Plan Current plan remains appropriate    Co-evaluation              AM-PAC PT "6 Clicks" Mobility   Outcome Measure  Help needed turning from your back to your side while in a flat bed without using bedrails?:  None Help needed moving from lying on your back to sitting on the side of a flat bed without using bedrails?: None Help needed moving to and from a bed to a chair (including a wheelchair)?: A Little Help needed standing up from a chair using your arms (e.g., wheelchair or bedside chair)?: A Little Help needed to walk in hospital room?: A Little Help needed climbing 3-5 steps with a railing? : A Lot 6 Click Score: 19    End of Session Equipment Utilized During Treatment: Gait belt;Oxygen Activity Tolerance: Patient tolerated treatment well Patient left: in bed;with call bell/phone within reach;with family/visitor present Nurse Communication: Mobility status PT Visit Diagnosis: Unsteadiness on feet (R26.81)     Time: 4503-8882 PT Time Calculation (min) (ACUTE ONLY): 36 min  Charges:  $Gait Training: 23-37 mins                     Ashley Pager:  [phone]  Office:  [place] 12/11/2018, 4:04 PM

## 2018-12-11 NOTE — Care Management Note (Signed)
Case Management Note  Patient Details  Name: Carla Dawson MRN: [id number] Date of Birth: Sep 26, 1931  Subjective/Objective:      For dc today, will need HHRN, HHPT, HHOT, HHAIDE and social work at [employer]. NCM offered choice, daughter chose [facility] , referral given to Butch Penny with [facility] . Soc will begin 24-48 hrs post dc.  Referral made to [facility] for home oxygen.                               Action/Plan: DC home when oxygen delivered to room.  Expected Discharge Date:  12/11/18               Expected Discharge Plan:  [place]  In-House Referral:     Discharge planning Services  CM Consult  Post Acute Care Choice:  Home Health Choice offered to:  Adult Children  DME Arranged:  Oxygen DME Agency:  [place] Arranged:  RN, Disease Management, PT, OT, Nurse's Aide, Social Work [employer] Agency:  [facility]  Status of Service:  Completed, signed off  If discussed at [employer], dates discussed:    Additional Comments:  Zenon Mayo, RN 12/11/2018, 3:00 PM

## 2018-12-12 LAB — CULTURE, BLOOD (ROUTINE X 2)
Culture: NO GROWTH
Culture: NO GROWTH
SPECIAL REQUESTS: ADEQUATE
Special Requests: ADEQUATE

## 2018-12-13 DIAGNOSIS — I480 Paroxysmal atrial fibrillation: Secondary | ICD-10-CM | POA: Diagnosis not present

## 2018-12-13 DIAGNOSIS — Z7902 Long term (current) use of antithrombotics/antiplatelets: Secondary | ICD-10-CM | POA: Diagnosis not present

## 2018-12-13 DIAGNOSIS — Z7982 Long term (current) use of aspirin: Secondary | ICD-10-CM | POA: Diagnosis not present

## 2018-12-13 DIAGNOSIS — I10 Essential (primary) hypertension: Secondary | ICD-10-CM | POA: Diagnosis not present

## 2018-12-13 DIAGNOSIS — J9621 Acute and chronic respiratory failure with hypoxia: Secondary | ICD-10-CM | POA: Diagnosis not present

## 2018-12-13 DIAGNOSIS — Z87891 Personal history of nicotine dependence: Secondary | ICD-10-CM | POA: Diagnosis not present

## 2018-12-13 DIAGNOSIS — Z7951 Long term (current) use of inhaled steroids: Secondary | ICD-10-CM | POA: Diagnosis not present

## 2018-12-13 DIAGNOSIS — G63 Polyneuropathy in diseases classified elsewhere: Secondary | ICD-10-CM | POA: Diagnosis not present

## 2018-12-13 DIAGNOSIS — I739 Peripheral vascular disease, unspecified: Secondary | ICD-10-CM | POA: Diagnosis not present

## 2018-12-13 DIAGNOSIS — D638 Anemia in other chronic diseases classified elsewhere: Secondary | ICD-10-CM | POA: Diagnosis not present

## 2018-12-13 DIAGNOSIS — J441 Chronic obstructive pulmonary disease with (acute) exacerbation: Secondary | ICD-10-CM | POA: Diagnosis not present

## 2018-12-13 DIAGNOSIS — Z9981 Dependence on supplemental oxygen: Secondary | ICD-10-CM | POA: Diagnosis not present

## 2018-12-14 ENCOUNTER — Telehealth: Payer: Self-pay

## 2018-12-14 DIAGNOSIS — D638 Anemia in other chronic diseases classified elsewhere: Secondary | ICD-10-CM | POA: Diagnosis not present

## 2018-12-14 DIAGNOSIS — J9621 Acute and chronic respiratory failure with hypoxia: Secondary | ICD-10-CM | POA: Diagnosis not present

## 2018-12-14 DIAGNOSIS — J441 Chronic obstructive pulmonary disease with (acute) exacerbation: Secondary | ICD-10-CM | POA: Diagnosis not present

## 2018-12-14 DIAGNOSIS — G63 Polyneuropathy in diseases classified elsewhere: Secondary | ICD-10-CM | POA: Diagnosis not present

## 2018-12-14 DIAGNOSIS — R002 Palpitations: Secondary | ICD-10-CM | POA: Diagnosis not present

## 2018-12-14 DIAGNOSIS — I480 Paroxysmal atrial fibrillation: Secondary | ICD-10-CM | POA: Diagnosis not present

## 2018-12-14 DIAGNOSIS — I739 Peripheral vascular disease, unspecified: Secondary | ICD-10-CM | POA: Diagnosis not present

## 2018-12-14 NOTE — Telephone Encounter (Signed)
Unable to reach patient at time of TCM Call.  Left message for patient to return call when available.  Appointment scheduled for 12/21/17

## 2018-12-14 NOTE — Discharge Summary (Signed)
[facility] Discharge Summary   Patient: Carla Chismar ENI:[id number]   PCP: Martinique, Betty G, MD DOB: 1931/04/10   Date of admission: 12/06/2018   Date of discharge: 12/11/2018    Discharge Diagnoses:   Principal Problem:   COPD exacerbation ([place]) Active Problems:   Essential hypertension, benign   PAD (peripheral artery disease) ([facility])   Neuropathy due to peripheral vascular disease ([place])   Hyperlipidemia   Anemia of chronic disease   SIRS (systemic inflammatory response syndrome) ([place])   Admitted From: home Disposition:  home  Recommendations for Outpatient Follow-up:  1. Please follow up with PCP in 1 week   Follow-up Information    Martinique, Betty G, MD. Schedule an appointment as soon as possible for a visit in 1 week(s).   Specialty:  Family Medicine Contact information: [address] [address] [place] [facility].   Why:  oxygen Contact information: [place] [address] [facility].   Specialty:  Home Health Services Why:  HHRN, HHPT, HHOT, HHAIDE, [place] social worker. Contact information: [address] [place] [address] [phone]          Diet recommendation: cardiac diet  Activity: The patient is advised to gradually reintroduce usual activities.  Discharge Condition: stable  Code Status: full code  History of present illness: As per the H and P dictated on admission, "Carla Dawson is a 85 y.o. female with medical history significant of COPD. OA, hyperlipidemia, anemia of chronic disease, HTN, PVD/PAD.  Here with her daughter at bedside.  Over the past week, the patient has been having worsening shortness of breath.  This morning, she woke up and her oxygen saturation dropped to the mid 80s.  This worked her up and she can never quite fully settle down.  She has been breathing  rapidly despite normal saturations at home.  Nebulization treatments did not work.  She had an episode where she spit up some mucus but still felt her breathing was labored.  She is compliant with home Spiriva and Symbicort.  She has been using her albuterol frequently at home over the past week.  The patient is having some cough and slight wheezing.  She denies any fevers, recent illnesses, chest pain."  Hospital Course:  Summary of her active problems in the hospital is as following. Acute on chronic hypoxic respiratory failure due to COPD exacerbation -She has been feeling short of breath for a while at home suspect a degree of chronic hypoxia, will likely need home oxygen on discharge  A. fib with RVR. Paroxysmal. Now normal sinus rhythm. -Change Norvasc to Cardizem.  Patient was given Cardizem bolus. Currently the patient is in sinus rhythm. RVR event was secondary to respiratory distress as well as nebulizing therapy. Patient has just submitted her Holter monitoring. No CV strips are saved from event for A. fib. We will continue with aspirin and Plavix.  Patient actually has a surgery scheduled later this month. Recommend outpatient follow-up with cardiology.  Hypertension -Continue current regimen, blood pressure is stable  PAD -Continue aspirin and Plavix, no concerning symptoms at this point  Neuropathy due to peripheral vascular disease -Continue gabapentin  Hyperlipidemia -Continue Lipitor  Anemia of chronic disease -Hemoglobin stable, no evidence of bleeding  Oral thrush. Continue nystatin on discharge.  Patient was seen by physical  therapy, who recommended home health, which was arranged by case manager. On the day of the discharge the patient's vitals were stable , and no other acute medical condition were reported by patient. the patient was felt safe to be discharge at home with home health.  Consultants: none Procedures: noen  DISCHARGE  MEDICATION: Allergies as of 12/11/2018      Reactions   Lortab [hydrocodone-acetaminophen] Shortness Of Breath, Itching   Morphine And Related Other (See Comments)   Unknown reaction      Medication List    STOP taking these medications   albuterol (2.5 MG/3ML) 0.083% nebulizer solution Commonly known as:  PROVENTIL   albuterol 108 (90 Base) MCG/ACT inhaler Commonly known as:  PROAIR HFA   amLODipine 5 MG tablet Commonly known as:  NORVASC     TAKE these medications   aspirin EC 81 MG tablet Take 81 mg by mouth daily.   atorvastatin 10 MG tablet Commonly known as:  LIPITOR Take 1 tablet (10 mg total) by mouth daily at 6 PM.   budesonide-formoterol 160-4.5 MCG/ACT inhaler Commonly known as:  SYMBICORT Inhale 2 puffs into the lungs 2 (two) times daily.   cetirizine 10 MG tablet Commonly known as:  ZYRTEC Take 10 mg by mouth at bedtime.   clopidogrel 75 MG tablet Commonly known as:  PLAVIX Take 75 mg by mouth at bedtime.   DELSYM 30 MG/5ML liquid Generic drug:  dextromethorphan Take 30 mg by mouth 2 (two) times daily as needed for cough.   dextromethorphan-guaiFENesin 30-600 MG 12hr tablet Commonly known as:  MUCINEX DM Take 0.5 tablets by mouth 2 (two) times daily.   diltiazem 180 MG 24 hr capsule Commonly known as:  CARDIZEM CD Take 1 capsule (180 mg total) by mouth daily.   famotidine 20 MG tablet Commonly known as:  PEPCID Take 20 mg by mouth at bedtime.   gabapentin 100 MG capsule Commonly known as:  NEURONTIN Take 1 capsule (100 mg total) by mouth 3 (three) times daily. What changed:    when to take this  reasons to take this   levalbuterol 45 MCG/ACT inhaler Commonly known as:  XOPENEX HFA Inhale 1 puff into the lungs every 4 (four) hours as needed for wheezing. For COPD ICD 10: J44. 9 For A fib ICD: 10 I48. 9   multivitamin with minerals Tabs tablet Take 1 tablet by mouth at bedtime.   nystatin 100000 UNIT/ML suspension Commonly known as:   MYCOSTATIN Take 5 mLs (500,000 Units total) by mouth 4 (four) times daily for 14 days.   omeprazole 20 MG capsule Commonly known as:  PRILOSEC Take 20 mg by mouth daily.   predniSONE 10 MG tablet Commonly known as:  DELTASONE Take 40mg  daily for 3days,Take 30mg  daily for 3days,Take 20mg  daily for 3days,Take 10mg  daily for 3days, then stop   sodium chloride 0.65 % Soln nasal spray Commonly known as:  OCEAN Place 1 spray into both nostrils as needed for congestion.   SPIRIVA RESPIMAT 2.5 MCG/ACT Aers Generic drug:  Tiotropium Bromide Monohydrate INHALE 2 PUFFS INTO THE LUNGS DAILY What changed:  See the new instructions.      Allergies  Allergen Reactions  . Lortab [Hydrocodone-Acetaminophen] Shortness Of Breath and Itching  . Morphine And Related Other (See Comments)    Unknown reaction   Discharge Instructions    Diet - low sodium heart healthy   Complete by:  As directed    Discharge instructions   Complete by:  As  directed    It is important that you read following instructions as well as go over your medication list with RN to help you understand your care after this hospitalization.  Discharge Instructions: Please follow-up with PCP in one week  Please request your primary care physician to go over all Hospital Tests and Procedure/Radiological results at the follow up,  Please get all Hospital records sent to your PCP by signing hospital release before you go home.   Do not take more than prescribed Pain, Sleep and Anxiety Medications. You were cared for by a hospitalist during your hospital stay. If you have any questions about your discharge medications or the care you received while you were in the hospital after you are discharged, you can call the unit you were admitted to and ask to speak with the hospitalist on call if the hospitalist that took care of you is not available.  Once you are discharged, your primary care physician will handle any further medical  issues. Please note that NO REFILLS for any discharge medications will be authorized once you are discharged, as it is imperative that you return to your primary care physician (or establish a relationship with a primary care physician if you do not have one) for your aftercare needs so that they can reassess your need for medications and monitor your lab values. You Must read complete instructions/literature along with all the possible adverse reactions/side effects for all the Medicines you take and that have been prescribed to you. Take any new Medicines after you have completely understood and accept all the possible adverse reactions/side effects. Wear Seat belts while driving. If you have smoked or chewed Tobacco in the last 2 yrs please stop smoking and/or stop any Recreational drug use.   Increase activity slowly   Complete by:  As directed      Discharge Exam: Filed Weights   12/06/18 0703 12/11/18 0626  Weight: 50.8 kg 48.6 kg   Vitals:   12/10/18 2329 12/11/18 0749  BP: (!) 159/75 (!) 151/60  Pulse: 70 72  Resp:    Temp: 98 F (36.7 C) 97.9 F (36.6 C)  SpO2: 99% 99%   General: Appear in mild distress, no Rash; Oral Mucosa shows thrush  Cardiovascular: S1 and S2 Present, no Murmur, no JVD Respiratory: Bilateral Air entry present and no Crackles, Occasional  wheezes Abdomen: Bowel Sound present, Soft and no tenderness Extremities: n Pedal edema, ono calf tenderness Neurology: Grossly no focal neuro deficit.  The results of significant diagnostics from this hospitalization (including imaging, microbiology, ancillary and laboratory) are listed below for reference.    Significant Diagnostic Studies: Dg Chest 2 View  Result Date: 12/06/2018 CLINICAL DATA:  85 year old female with history of cough since yesterday evening and this morning. EXAM: CHEST - 2 VIEW COMPARISON:  Chest x-ray 11/25/2018. FINDINGS: Lung volumes are slightly increased with emphysematous changes. No  consolidative airspace disease. No pleural effusions. No pneumothorax. No pulmonary nodule or mass noted. Pulmonary vasculature and the cardiomediastinal silhouette are within normal limits. Atherosclerosis in the thoracic aorta. Electronic device projecting over the upper left hemithorax again noted. IMPRESSION: 1. No radiographic evidence of acute cardiopulmonary disease. 2. Aortic atherosclerosis. 3. Emphysema. Electronically Signed   By: Vinnie Langton M.D.   On: 12/06/2018 08:56   Dg Chest 2 View  Result Date: 11/26/2018 CLINICAL DATA:  Cough, shortness of Breath EXAM: CHEST - 2 VIEW COMPARISON:  02/04/2018 FINDINGS: There is hyperinflation of the lungs compatible with COPD. Right  apical calcified granuloma again noted, unchanged. Heart and mediastinal contours are within normal limits. No focal opacities or effusions. No acute bony abnormality. IMPRESSION: COPD/chronic changes.  No active disease. Electronically Signed   By: Rolm Baptise M.D.   On: 11/26/2018 08:29   Vas [place] Burnard Bunting With/wo Tbi  Result Date: 11/24/2018 LOWER EXTREMITY DOPPLER STUDY Indications: Claudication, and peripheral artery disease. High Risk Factors: Hypertension, hyperlipidemia.  Vascular Interventions: Vein patch angioplasty of the left below knee pop, left                         CFA endarterectomy with profundoplasty and vein patch,                         left BK BPG with propaten 04/12/2018. Performing Technologist: Ronal Fear RVS, RCS  Examination Guidelines: A complete evaluation includes at minimum, Doppler waveform signals and systolic blood pressure reading at the level of bilateral brachial, anterior tibial, and posterior tibial arteries, when vessel segments are accessible. Bilateral testing is considered an integral part of a complete examination. Photoelectric Plethysmograph (PPG) waveforms and toe systolic pressure readings are included as required and additional duplex testing as needed. Limited examinations  for reoccurring indications may be performed as noted.  ABI Findings: +---------+------------------+-----+----------+--------+ Right    Rt Pressure (mmHg)IndexWaveform  Comment  +---------+------------------+-----+----------+--------+ Brachial 168                                       +---------+------------------+-----+----------+--------+ PTA      144               0.86 monophasic         +---------+------------------+-----+----------+--------+ DP       156               0.93 biphasic           +---------+------------------+-----+----------+--------+ Great Toe57                0.34                    +---------+------------------+-----+----------+--------+ +---------+------------------+-----+--------+-------+ Left     Lt Pressure (mmHg)IndexWaveformComment +---------+------------------+-----+--------+-------+ Brachial 165                                    +---------+------------------+-----+--------+-------+ PTA      127               0.76 biphasic        +---------+------------------+-----+--------+-------+ DP       129               0.77 biphasic        +---------+------------------+-----+--------+-------+ Great Toe61                0.36                 +---------+------------------+-----+--------+-------+ +-------+-----------+-----------+------------+------------+ ABI/TBIToday's ABIToday's TBIPrevious ABIPrevious TBI +-------+-----------+-----------+------------+------------+ Right  0.93       0.34       0.52        0.72         +-------+-----------+-----------+------------+------------+ Left   0.77       0.36       0.86        0.64         +-------+-----------+-----------+------------+------------+  Summary: Right: Resting right ankle-brachial index indicates mild right lower extremity arterial disease. The right toe-brachial index is abnormal. Left: Resting left ankle-brachial index indicates moderate left lower extremity arterial  disease. The left toe-brachial index is abnormal.  *See table(s) above for measurements and observations.  Electronically signed by Monica Martinez MD on 11/24/2018 at 9:38:04 AM.    Final    Vas [place] Lower Extremity Bypass Graft Duplex  Result Date: 11/24/2018 LOWER EXTREMITY ARTERIAL DUPLEX STUDY Indications: Claudication, and peripheral artery disease. High Risk Factors: Hypertension, hyperlipidemia.  Vascular Interventions: Vein patch angioplasty of the left below knee pop, left                         CFA endarterectomy with profundoplasty and vein patch,                         left BK BPG with propaten 04/12/2018. Current ABI:            R=0.93, L=0.77  Examination Guidelines: A complete evaluation includes B-mode imaging, spectral Doppler, color Doppler, and power Doppler as needed of all accessible portions of each vessel. Bilateral testing is considered an integral part of a complete examination. Limited examinations for reoccurring indications may be performed as noted.  Left Graft #1: +--------------------+--------+---------------+----------+--------+                     PSV cm/sStenosis       Waveform  Comments +--------------------+--------+---------------+----------+--------+ Inflow              508     75-99% stenosismonophasic         +--------------------+--------+---------------+----------+--------+ Proximal Anastomosis224                    monophasic         +--------------------+--------+---------------+----------+--------+ Proximal Graft      73                     monophasic         +--------------------+--------+---------------+----------+--------+ Mid Graft           33                     monophasic         +--------------------+--------+---------------+----------+--------+ Distal Graft        27                     monophasic         +--------------------+--------+---------------+----------+--------+ Distal Anastamosis  64                      monophasic         +--------------------+--------+---------------+----------+--------+ Outflow             82                     monophasic         +--------------------+--------+---------------+----------+--------+  Summary: Left: No evidence of arterial occlusive disease within the bypass graft; however, inflow disease is observed (75-99%).  See table(s) above for measurements and observations. Electronically signed by Monica Martinez MD on 11/24/2018 at 12:51:07 PM.    Final     Microbiology: Recent Results (from the past 240 hour(s))  Culture, blood (Routine X 2) w Reflex to ID Panel     Status: None   Collection Time:  12/07/18  5:05 PM  Result Value Ref Range Status   Specimen Description BLOOD LEFT HAND  Final   Special Requests   Final    BOTTLES DRAWN AEROBIC ONLY Blood Culture adequate volume   Culture NO GROWTH 5 DAYS  Final   Report Status 12/12/2018 FINAL  Final  Culture, blood (Routine X 2) w Reflex to ID Panel     Status: None   Collection Time: 12/07/18  5:05 PM  Result Value Ref Range Status   Specimen Description BLOOD RIGHT ANTECUBITAL  Final   Special Requests   Final    BOTTLES DRAWN AEROBIC AND ANAEROBIC Blood Culture adequate volume   Culture NO GROWTH 5 DAYS  Final   Report Status 12/12/2018 FINAL  Final     Labs: CBC: Recent Labs  Lab 12/08/18 0739 12/09/18 0303 12/10/18 0226 12/11/18 0339  WBC 18.3* 15.8* 17.1* 12.7*  HGB 9.3* 9.2* 9.2* 10.1*  HCT 30.4* 30.2* 29.6* 33.1*  MCV 87.4 85.1 86.8 86.4  PLT 289 284 378 161*   Basic Metabolic Panel: Recent Labs  Lab 12/08/18 0739 12/10/18 0226 12/11/18 0339  NA 136 140 141  K 3.9 4.0 4.8  CL 100 102 101  CO2 27 31 34*  GLUCOSE 114* 93 107*  BUN 9 17 16   CREATININE 0.72 0.88 0.71  CALCIUM 9.1 9.4 9.8  MG  --  2.2  --    Time spent: 35 minutes  Signed:  Berle Mull  [facility] 12/11/2018 , 3:07 PM

## 2018-12-14 NOTE — Telephone Encounter (Signed)
Transition Care Management Follow-up Telephone Call   Date discharged? 12/11/18   How have you been since you were released from the hospital? "when she left she still had a little congestion, saw the nurse yesterday and she said she could here a little rattle but not bad. She is off oxygen. She is doing better than I thought she would."   Do you understand why you were in the hospital? yes   Do you understand the discharge instructions? yes   Where were you discharged to? Home   Items Reviewed:  Medications reviewed: yes  Allergies reviewed: yes  Dietary changes reviewed: yes  Referrals reviewed: yes   Functional Questionnaire:   Activities of Daily Living (ADLs):   She states they are independent in the following: ambulation, feeding, continence, grooming, toileting and dressing States they require assistance with the following: bathing and hygiene "she's afraid to take a shower because of the steam. She thinks it will bother her breathing."   Any transportation issues/concerns?: yes, Daughter drives   Any patient concerns? yes, patient would like to know if she should be on ibuprofen of tylenol with her medication.   Confirmed importance and date/time of follow-up visits scheduled yes  Provider Appointment booked with Dr. Martinique on 12/21/17  Confirmed with patient if condition begins to worsen call PCP or go to the ER.  Patient was given the office number and encouraged to call back with question or concerns.  : yes

## 2018-12-15 ENCOUNTER — Telehealth: Payer: Self-pay

## 2018-12-15 ENCOUNTER — Telehealth: Payer: Self-pay | Admitting: Family Medicine

## 2018-12-15 DIAGNOSIS — J9621 Acute and chronic respiratory failure with hypoxia: Secondary | ICD-10-CM | POA: Diagnosis not present

## 2018-12-15 DIAGNOSIS — D638 Anemia in other chronic diseases classified elsewhere: Secondary | ICD-10-CM | POA: Diagnosis not present

## 2018-12-15 DIAGNOSIS — J441 Chronic obstructive pulmonary disease with (acute) exacerbation: Secondary | ICD-10-CM | POA: Diagnosis not present

## 2018-12-15 DIAGNOSIS — I480 Paroxysmal atrial fibrillation: Secondary | ICD-10-CM | POA: Diagnosis not present

## 2018-12-15 DIAGNOSIS — I739 Peripheral vascular disease, unspecified: Secondary | ICD-10-CM | POA: Diagnosis not present

## 2018-12-15 DIAGNOSIS — G63 Polyneuropathy in diseases classified elsewhere: Secondary | ICD-10-CM | POA: Diagnosis not present

## 2018-12-15 NOTE — Telephone Encounter (Signed)
Pt's daughter updated with test results along with Dr. Judeth Cornfield recommendation. Daughter verbalized understanding and states pt was just recently admitted in the hospital on 12/29 and started on Diltiazem 180 mg daily. Will make MD aware.

## 2018-12-15 NOTE — Telephone Encounter (Signed)
Okay to give verbal orders? Please advise. 

## 2018-12-15 NOTE — Telephone Encounter (Signed)
-----   Message from Buford Dresser, MD sent at 12/15/2018  9:06 AM EST ----- There were a large number of episodes of SVT, which are fast rhythms that come from the top chamber of the heart. They typically don't last long, just a few beats, but the longest lasted almost a minute. There are a few options for this. We often start with a medication to see if we can make them less frequent. Given your lung disease, I would stay away from medications called beta blockers and instead use a medication called diltiazem. It's safe overall, but you may notice a little more leg swelling. It may also help with your blood pressure too. If you are ok with it, I will send in a script for diltiazem 120 mg long acting daily and see if that helps. You have an appointment with me in March, but if you want to be seen sooner just let us know.

## 2018-12-15 NOTE — Telephone Encounter (Signed)
Copied from [place] [id number]. Topic: Quick Communication - Home Health Verbal Orders >> Dec 15, 2018  2:19 PM Margot Ables wrote: Caller/Agency: Jeanmarie Hubert RN with [facility] Callback Number: [phone], secure VM if not available Requesting OT/PT/Skilled Nursing/Social Work: SN, PT, and HH aide Frequency: SN 2x week 2 weeks, 1 week 1, 1 every other week 6 HH aide 2x week 3 weeks And HH PT

## 2018-12-16 DIAGNOSIS — J9621 Acute and chronic respiratory failure with hypoxia: Secondary | ICD-10-CM | POA: Diagnosis not present

## 2018-12-16 DIAGNOSIS — I480 Paroxysmal atrial fibrillation: Secondary | ICD-10-CM | POA: Diagnosis not present

## 2018-12-16 DIAGNOSIS — I739 Peripheral vascular disease, unspecified: Secondary | ICD-10-CM | POA: Diagnosis not present

## 2018-12-16 DIAGNOSIS — G63 Polyneuropathy in diseases classified elsewhere: Secondary | ICD-10-CM | POA: Diagnosis not present

## 2018-12-16 DIAGNOSIS — J441 Chronic obstructive pulmonary disease with (acute) exacerbation: Secondary | ICD-10-CM | POA: Diagnosis not present

## 2018-12-16 DIAGNOSIS — D638 Anemia in other chronic diseases classified elsewhere: Secondary | ICD-10-CM | POA: Diagnosis not present

## 2018-12-16 NOTE — Telephone Encounter (Signed)
It is okay to give verbal orders for OT as requested. Thanks, BJ

## 2018-12-16 NOTE — Telephone Encounter (Signed)
Spoke with Angie from [facility] and gave verbal orders for OT as requested.

## 2018-12-16 NOTE — Telephone Encounter (Signed)
Verbal orders given to Angie as requested.

## 2018-12-17 ENCOUNTER — Ambulatory Visit: Payer: Medicare Other

## 2018-12-17 ENCOUNTER — Telehealth: Payer: Self-pay | Admitting: *Deleted

## 2018-12-17 DIAGNOSIS — D638 Anemia in other chronic diseases classified elsewhere: Secondary | ICD-10-CM | POA: Diagnosis not present

## 2018-12-17 DIAGNOSIS — I739 Peripheral vascular disease, unspecified: Secondary | ICD-10-CM | POA: Diagnosis not present

## 2018-12-17 DIAGNOSIS — G63 Polyneuropathy in diseases classified elsewhere: Secondary | ICD-10-CM | POA: Diagnosis not present

## 2018-12-17 DIAGNOSIS — I480 Paroxysmal atrial fibrillation: Secondary | ICD-10-CM | POA: Diagnosis not present

## 2018-12-17 DIAGNOSIS — J9621 Acute and chronic respiratory failure with hypoxia: Secondary | ICD-10-CM | POA: Diagnosis not present

## 2018-12-17 DIAGNOSIS — J441 Chronic obstructive pulmonary disease with (acute) exacerbation: Secondary | ICD-10-CM | POA: Diagnosis not present

## 2018-12-17 NOTE — Telephone Encounter (Signed)
Patient's daughter called and requested surgery be postpone x 1 week due to Carla Dawson's recent hospitalization and give her more time to recover before a surgery. Surgery moved to 01/06/2019 as requested. To  Arrive at hospital admitting department  at 5:30 am. All other pre-op instructions unchanged and hold Plavix x 5 days pre-op. Verbalize understanding.

## 2018-12-17 NOTE — Progress Notes (Signed)
Subjective:   Carla Dawson is a 85 y.o. female who presents for Medicare Annual (Subsequent) preventive examination with her daughter, [name].  Review of Systems:  No ROS.  Medicare Wellness Visit. Additional risk factors are reflected in the social history.  Cardiac Risk Factors include: advanced age (>59men, >77 women);dyslipidemia;hypertension Sleep patterns: Does not sleep well, waking up in the middle of the night sometimes feeling SOB. Now that pt. Is on oxygen ATC, symptoms may improve.     Home Safety/Smoke Alarms: Feels safe in home. Smoke alarms in place.  Living environment; residence and Firearm Safety: Has tub bench. Lives with daughter who assists with her care as much as possible involving set-up mostly. Throw rugs present, and author encouraged patient and daughter to remove, along with other fall precautions. Pt. has rolling walker "but doesn't really use it" per daughter.   Seat Belt Safety/Bike Helmet: Wears seat belt. No longer drives.  Female:   Dexa scan- Last on record 04/2013.           Objective:     Vitals: SpO2 98% Comment: 3L N/C. Pt. states she feels at ease  There is no height or weight on file to calculate BMI.  Advanced Directives 12/21/2018 12/06/2018 11/06/2018 11/04/2018 11/04/2018 11/03/2018 10/27/2018  Does Patient Have a Medical Advance Directive? No No No No No No No  Type of Advance Directive - - - - - - -  Does patient want to make changes to medical advance directive? - - - - - - -  Copy of [place] in Chart? - - - - - - -  Would patient like information on creating a medical advance directive? Yes (MAU/Ambulatory/Procedural Areas - Information given) No - Patient declined No - Patient declined No - Patient declined No - Patient declined No - Patient declined -    Tobacco Social History   Tobacco Use  Smoking Status Former Smoker  . Packs/day: 1.50  . Years: 52.00  . Pack years: 78.00  . Start date:  12/09/1953  . Last attempt to quit: 12/09/2005  . Years since quitting: 13.0  Smokeless Tobacco Never Used     Counseling given: Not Answered   Past Medical History:  Diagnosis Date  . Allergy    "maybe seasonal allergies" (07/08/2017)  . Arthritis    "hands" (07/08/2017)  . Chicken pox   . COPD (chronic obstructive pulmonary disease) ([place])   . High cholesterol    "took me off pills ~ 1-2 months ago" (07/08/2017)  . History of blood transfusion 2017   "when I had blood clot in my leg"  . Hypertension   . PAD (peripheral artery disease) ([place])    severe/notes 10/14/2016  . Pneumonia ~ 2015  . PVD (peripheral vascular disease) ([place])    Past Surgical History:  Procedure Laterality Date  . ABDOMINAL AORTOGRAM N/A 04/22/2017   Procedure: Abdominal Aortogram;  Surgeon: Serafina Mitchell, MD;  Location: [facility];  Service: Cardiovascular;  Laterality: N/A;  . ABDOMINAL AORTOGRAM W/LOWER EXTREMITY N/A 03/04/2017   Procedure: Abdominal Aortogram w/Lower Extremity;  Surgeon: Serafina Mitchell, MD;  Location: [facility];  Service: Cardiovascular;  Laterality: N/A;  . ABDOMINAL AORTOGRAM W/LOWER EXTREMITY N/A 07/08/2017   Procedure: Abdominal Aortogram w/Lower Extremity;  Surgeon: Serafina Mitchell, MD;  Location: [facility];  Service: Cardiovascular;  Laterality: N/A;  . BIOPSY THYROID    . CATARACT EXTRACTION W/ INTRAOCULAR LENS  IMPLANT, BILATERAL  Bilateral   . EMBOLECTOMY Right 04/09/2018   Procedure: RIGHT COMMON FEMORAL ENDARTERECTOMY  WITH BOVINE PATCH ANGIOPLASTY; RIGHT LOWER LEG THROMBOEMBOLECTOMY INTRAOPERATIVE ARTERIOGRAM;  Surgeon: Waynetta Sandy, MD;  Location: [facility];  Service: Vascular;  Laterality: Right;  . ENDARTERECTOMY FEMORAL Right 11/04/2018   Procedure: ENDARTERECTOMY ILIO-FEMORAL ENDARTECTOMY;  Surgeon: Serafina Mitchell, MD;  Location: [facility];  Service: Vascular;  Laterality: Right;  . FEMORAL-POPLITEAL BYPASS GRAFT Left 04/12/2018   Procedure: Vein  Patch Angioplasty Below Knee Popliteal Artery; Left Femoral Endarterectomy with Profundaplasty; Left Femoral-Below Knee Popliteal Bypass Graft using a 28mm by 80cm Thin Wall Propaten Gortex Graft;  Surgeon: Elam Dutch, MD;  Location: [facility];  Service: Vascular;  Laterality: Left;  . HEMATOMA EVACUATION Right 2017   "S/P procedure; discharged; had to come back for emergency OR"  . LOWER EXTREMITY ANGIOGRAPHY Bilateral 04/22/2017   Procedure: Lower Extremity Angiography;  Surgeon: Serafina Mitchell, MD;  Location: [facility];  Service: Cardiovascular;  Laterality: Bilateral;  . LOWER EXTREMITY ANGIOGRAPHY Bilateral 04/07/2018   Procedure: LOWER EXTREMITY ANGIOGRAPHY;  Surgeon: Serafina Mitchell, MD;  Location: [facility];  Service: Cardiovascular;  Laterality: Bilateral;  . LOWER EXTREMITY ANGIOGRAPHY N/A 11/03/2018   Procedure: LOWER EXTREMITY ANGIOGRAPHY;  Surgeon: Serafina Mitchell, MD;  Location: [facility];  Service: Cardiovascular;  Laterality: N/A;  . PATCH ANGIOPLASTY Right 11/04/2018   Procedure: PATCH ANGIOPLASTY WITH Weyman Pedro;  Surgeon: Serafina Mitchell, MD;  [facility];  Service: Vascular;  Laterality: Right;  . PERIPHERAL VASCULAR BALLOON ANGIOPLASTY Left 03/04/2017   Procedure: Peripheral Vascular Balloon Angioplasty;  Surgeon: Serafina Mitchell, MD;  Location: [facility];  Service: Cardiovascular;  Laterality: Left;  SFA and PT  . PERIPHERAL VASCULAR BALLOON ANGIOPLASTY Right 11/03/2018   Procedure: PERIPHERAL VASCULAR BALLOON ANGIOPLASTY;  Surgeon: Serafina Mitchell, MD;  Location: [facility];  Service: Cardiovascular;  Laterality: Right;  common femoral  . PERIPHERAL VASCULAR INTERVENTION  07/08/2017  . PERIPHERAL VASCULAR INTERVENTION  07/08/2017   Procedure: Peripheral Vascular Intervention;  Surgeon: Serafina Mitchell, MD;  Location: [facility];  Service: Cardiovascular;;  . PERIPHERAL VASCULAR INTERVENTION Left 04/07/2018    Procedure: PERIPHERAL VASCULAR INTERVENTION;  Surgeon: Serafina Mitchell, MD;  Location: [facility];  Service: Cardiovascular;  Laterality: Left;  . THROMBECTOMY ILIAC ARTERY Right 11/04/2018   Procedure: THROMBECTOMY RIGHT ILIO AND FEMORAL-TIBIAL BYPASS;  Surgeon: Serafina Mitchell, MD;  Location[facility];  Service: Vascular;  Laterality: Right;  Marland Kitchen VAGINAL HYSTERECTOMY    . VEIN BYPASS SURGERY     Family History  Problem Relation Age of Onset  . Arthritis Mother   . Hypertension Mother   . Arthritis Father   . Hypertension Father   . Cancer Brother   . Hypertension Son    Social History   Socioeconomic History  . Marital status: Widowed    Spouse name: Not on file  . Number of children: Not on file  . Years of education: Not on file  . Highest education level: Not on file  Occupational History  . Not on file  Social Needs  . Financial resource strain: Not on file  . Food insecurity:    Worry: Patient refused    Inability: Patient refused  . Transportation needs:    Medical: Patient refused    Non-medical: Patient refused  Tobacco Use  . Smoking status: Former Smoker    Packs/day: 1.50  Years: 52.00    Pack years: 78.00    Start date: 12/09/1953    Last attempt to quit: 12/09/2005    Years since quitting: 13.0  . Smokeless tobacco: Never Used  Substance and Sexual Activity  . Alcohol use: No  . Drug use: No  . Sexual activity: Not Currently  Lifestyle  . Physical activity:    Days per week: Patient refused    Minutes per session: Patient refused  . Stress: Patient refused  Relationships  . Social connections:    Talks on phone: Patient refused    Gets together: Patient refused    Attends religious service: Patient refused    Active member of club or organization: Patient refused    Attends meetings of clubs or organizations: Patient refused    Relationship status: Patient refused  Other Topics Concern  . Not on file  Social History Narrative   n/a     Outpatient Encounter Medications as of 12/21/2018  Medication Sig  . aspirin EC 81 MG tablet Take 81 mg by mouth daily.  Marland Kitchen atorvastatin (LIPITOR) 10 MG tablet Take 1 tablet (10 mg total) by mouth daily at 6 PM.  . budesonide-formoterol (SYMBICORT) 160-4.5 MCG/ACT inhaler Inhale 2 puffs into the lungs 2 (two) times daily.  . cetirizine (ZYRTEC) 10 MG tablet Take 10 mg by mouth at bedtime.   . clopidogrel (PLAVIX) 75 MG tablet Take 75 mg by mouth at bedtime.   Marland Kitchen dextromethorphan (DELSYM) 30 MG/5ML liquid Take 30 mg by mouth 2 (two) times daily as needed for cough.  . dextromethorphan-guaiFENesin (MUCINEX DM) 30-600 MG 12hr tablet Take 0.5 tablets by mouth 2 (two) times daily.  . famotidine (PEPCID) 20 MG tablet Take 20 mg by mouth at bedtime.  . gabapentin (NEURONTIN) 100 MG capsule Take 1 capsule (100 mg total) by mouth 3 (three) times daily. (Patient taking differently: Take 100 mg by mouth 3 (three) times daily as needed (for legs pain). )  . levalbuterol (XOPENEX HFA) 45 MCG/ACT inhaler Inhale 1 puff into the lungs every 4 (four) hours as needed for wheezing. For COPD ICD 10: J44. 9 For A fib ICD: 10 I48. 9  . Multiple Vitamin (MULTIVITAMIN WITH MINERALS) TABS tablet Take 1 tablet by mouth at bedtime.   Marland Kitchen nystatin (MYCOSTATIN) 100000 UNIT/ML suspension Take 5 mLs (500,000 Units total) by mouth 4 (four) times daily for 14 days.  Marland Kitchen omeprazole (PRILOSEC) 20 MG capsule Take 20 mg by mouth daily.  . predniSONE (DELTASONE) 10 MG tablet Take 40mg  daily for 3days,Take 30mg  daily for 3days,Take 20mg  daily for 3days,Take 10mg  daily for 3days, then stop  . sodium chloride (OCEAN) 0.65 % SOLN nasal spray Place 1 spray into both nostrils as needed for congestion.  Marland Kitchen SPIRIVA RESPIMAT 2.5 MCG/ACT AERS INHALE 2 PUFFS INTO THE LUNGS DAILY (Patient taking differently: Inhale 2 puffs into the lungs daily. )  . [DISCONTINUED] diltiazem (CARDIZEM CD) 180 MG 24 hr capsule Take 1 capsule (180 mg total) by mouth  daily.   No facility-administered encounter medications on file as of 12/21/2018.     Activities of Daily Living In your present state of health, do you have any difficulty performing the following activities: 12/21/2018 12/06/2018  Hearing? Tempie Donning  Vision? N N  Difficulty concentrating or making decisions? Y N  Walking or climbing stairs? Y N  Dressing or bathing? Aggie Moats  Comment daughter working on getting in-home care services -  Doing errands, shopping? Tempie Donning  Preparing  Food and eating ? Y -  Comment daughter provides home-cooked ready to eat meals -  Using the Toilet? Y -  In the past six months, have you accidently leaked urine? Y -  Do you have problems with loss of bowel control? N -  Managing your Medications? Y -  Managing your Finances? N -  Housekeeping or managing your Housekeeping? Y -  Some recent data might be hidden    Patient Care Team: Martinique, Betty G, MD as PCP - General (Family Medicine) Buford Dresser, MD as PCP - Cardiology (Cardiology) Rutherford Guys, MD as Consulting Physician (Ophthalmology) Druscilla Brownie, MD as Consulting Physician (Dermatology) Tanda Rockers, MD as Consulting Physician (Pulmonary Disease) Serafina Mitchell, MD as Consulting Physician (Vascular Surgery)    Assessment:   This is a routine wellness examination for Arely. Physical assessment deferred to PCP.   Exercise Activities and Dietary recommendations Current Exercise Habits: The patient does not participate in regular exercise at present, Exercise limited by: cardiac condition(s);orthopedic condition(s);respiratory conditions(s)  Low-intensity walking encouraged.  Diet (meal preparation, eat out, water intake, caffeinated beverages, dairy products, fruits and vegetables): in general, a "healthy" diet  . Daughter prepares meals for patient. Ensure offered to patient and patient enjoyed.   Goals    . Advanced Care Planning complete by 01/07/2019     Review documentation  and call us if any questions    . patient     Would like information on activities  [facility]  Address: [address] [place], [place] [address]  Hours:  Open today  8AM-8PM Phone: [phone]   [facility]; [phone] [address] [place]; [phone] Get resource to get information on any and all community programs for Seniors    Adult center for Enrichment;  [employer]; [phone]  Adult day services include Adult Day Care, [place], [facility], Education and [place] group and information regarding [facility] is at the; [employer] Address: [address], [place], [place] [address]  Phone: [phone]  [facility] [place] - can assist with hearing aid x 1  No reviews  [employer] Office  [place] [address]  [phone]      . Patient Stated     Continue to live as independently as possible.       Fall Risk Fall Risk  12/21/2018 10/15/2016  Falls in the past year? 1 No  Injury with Fall? 0 -  Risk for fall due to : History of fall(s);Impaired balance/gait;Impaired mobility;Impaired vision;Medication side effect -    Depression Screen PHQ 2/9 Scores 12/21/2018 02/28/2018 10/15/2016  PHQ - 2 Score 2 0 0  PHQ- 9 Score 4 - -     Cognitive Function       Ad8 score reviewed for issues:  Issues making decisions: no  Less interest in hobbies / activities: no  Repeats questions, stories (family complaining): no  Trouble using ordinary gadgets (microwave, computer, phone):no  Forgets the month or year: no  Mismanaging finances: no  Remembering appts: no  Daily problems with thinking and/or memory: yes Ad8 score is= 1    Immunization History  Administered Date(s) Administered  . Influenza, High Dose Seasonal PF 10/07/2016, 10/14/2017, 11/17/2018  . Pneumococcal Conjugate-13 04/03/2017  .  Pneumococcal Polysaccharide-23 12/09/1998  . Tdap 12/09/2013    Screening Tests Health Maintenance  Topic Date Due  .  TETANUS/TDAP  12/10/2023  . INFLUENZA VACCINE  Completed  . DEXA SCAN  Completed  . PNA vac Low Risk Adult  Completed        Plan:    Provider-signed handicap placard application given.  Senior resources provided on adult day centers and in home health care.   Care connection brochure provided to daughter per Dr. Doug Sou request.  Local caregiver support group information given.   Continue to wear oxygen as prescribed by MD, keeping 02 above 89%. Be aware of over-oxygenation as a person with COPD.   Keep fall precautions in mind, especially with new oxygen cannula and tank supplies. But don't let the tank keep you from moving!! The oxygen is your friend, not your barrier to living as well and as independently as you can!  Review healthcare advance directive information provided, and bring it with you at your next visit. Keep the original, and make copies for you/your family.   I have personally reviewed and noted the following in the patient's chart:   . Medical and social history . Use of alcohol, tobacco or illicit drugs  . Current medications and supplements . Functional ability and status . Nutritional status . Physical activity . Advanced directives . List of other physicians . Vitals . Screenings to include cognitive, depression, and falls . Referrals and appointments  In addition, I have reviewed and discussed with patient certain preventive protocols, quality metrics, and best practice recommendations. A written personalized care plan for preventive services as well as general preventive health recommendations were provided to patient.     Alphia Moh, RN  12/21/2018

## 2018-12-18 ENCOUNTER — Telehealth: Payer: Self-pay | Admitting: Family Medicine

## 2018-12-18 DIAGNOSIS — I739 Peripheral vascular disease, unspecified: Secondary | ICD-10-CM | POA: Diagnosis not present

## 2018-12-18 DIAGNOSIS — G63 Polyneuropathy in diseases classified elsewhere: Secondary | ICD-10-CM | POA: Diagnosis not present

## 2018-12-18 DIAGNOSIS — J9621 Acute and chronic respiratory failure with hypoxia: Secondary | ICD-10-CM | POA: Diagnosis not present

## 2018-12-18 DIAGNOSIS — I480 Paroxysmal atrial fibrillation: Secondary | ICD-10-CM | POA: Diagnosis not present

## 2018-12-18 DIAGNOSIS — D638 Anemia in other chronic diseases classified elsewhere: Secondary | ICD-10-CM | POA: Diagnosis not present

## 2018-12-18 DIAGNOSIS — J441 Chronic obstructive pulmonary disease with (acute) exacerbation: Secondary | ICD-10-CM | POA: Diagnosis not present

## 2018-12-18 NOTE — Telephone Encounter (Signed)
Copied from [place] [id number]. Topic: Quick Communication - Home Health Verbal Orders >> Dec 18, 2018 10:37 AM Margot Ables wrote: Caller/Agency: [facility] w/[facility] Callback Number: [phone], secure VM if not available Requesting OT/PT/Skilled Nursing/Social Work: OT Frequency: requesting VO for OT 2x week for 3 weeks

## 2018-12-18 NOTE — Telephone Encounter (Signed)
Left voicemail for Carla Dawson with verbal orders for OT, advised to call office with any questions.

## 2018-12-21 ENCOUNTER — Ambulatory Visit (INDEPENDENT_AMBULATORY_CARE_PROVIDER_SITE_OTHER): Payer: Medicare Other | Admitting: Family Medicine

## 2018-12-21 ENCOUNTER — Encounter: Payer: Self-pay | Admitting: Family Medicine

## 2018-12-21 ENCOUNTER — Ambulatory Visit (INDEPENDENT_AMBULATORY_CARE_PROVIDER_SITE_OTHER): Payer: Medicare Other

## 2018-12-21 VITALS — BP 136/50 | HR 63 | Temp 98.2°F | Resp 16 | Ht 61.0 in

## 2018-12-21 DIAGNOSIS — I480 Paroxysmal atrial fibrillation: Secondary | ICD-10-CM | POA: Diagnosis not present

## 2018-12-21 DIAGNOSIS — Z0000 Encounter for general adult medical examination without abnormal findings: Secondary | ICD-10-CM

## 2018-12-21 DIAGNOSIS — I10 Essential (primary) hypertension: Secondary | ICD-10-CM

## 2018-12-21 DIAGNOSIS — G63 Polyneuropathy in diseases classified elsewhere: Secondary | ICD-10-CM | POA: Diagnosis not present

## 2018-12-21 DIAGNOSIS — I739 Peripheral vascular disease, unspecified: Secondary | ICD-10-CM

## 2018-12-21 DIAGNOSIS — D638 Anemia in other chronic diseases classified elsewhere: Secondary | ICD-10-CM | POA: Diagnosis not present

## 2018-12-21 DIAGNOSIS — J9621 Acute and chronic respiratory failure with hypoxia: Secondary | ICD-10-CM | POA: Diagnosis not present

## 2018-12-21 DIAGNOSIS — J441 Chronic obstructive pulmonary disease with (acute) exacerbation: Secondary | ICD-10-CM

## 2018-12-21 DIAGNOSIS — R0609 Other forms of dyspnea: Secondary | ICD-10-CM | POA: Diagnosis not present

## 2018-12-21 MED ORDER — DILTIAZEM HCL ER COATED BEADS 180 MG PO CP24: 180.0000 mg | ORAL_CAPSULE | Freq: Every day | ORAL | 2 refills | Status: AC

## 2018-12-21 NOTE — Patient Instructions (Addendum)
A few things to remember from today's visit:   COPD exacerbation ([place])  Paroxysmal atrial fibrillation ([place])  DOE (dyspnea on exertion)  PAD (peripheral artery disease) ([place])  Keep appointment with pulmonologist.  You can stop Oxygen during the day n and use it as needed. Goal of oxygen in your blood 89 and higher.   Please be sure medication list is accurate. If a new problem present, please set up appointment sooner than planned today.

## 2018-12-21 NOTE — Assessment & Plan Note (Signed)
She is planning on vascular procedure at the end of this month. We discussed some increased of general anesthesia, given the fact she was just released from hospital dn still on supplemental O2. She will keep appointment with Dr. Melvyn Novas, pulmonologist, and discuss pre operatory risks.

## 2018-12-21 NOTE — Assessment & Plan Note (Signed)
Improved. Continue supplemental oxygen at 2 LPM, she can use it during the day as needed but recommend having it through the night. No changes in Symbicort 160-4.5 mcg or Xopenex inhaler, we discussed some side effects. Keep appointment with her pulmonologist on 01/04/2019.

## 2018-12-21 NOTE — Patient Instructions (Addendum)
Handicap placard application given.  Senior resources provided on adult day centers and in home health care.   Local caregiver support group information given.   Continue to wear oxygen as prescribed by MD, keeping 02 above 89%. Be aware of over-oxygenation as a person with COPD.   Keep fall precautions in mind, especially with new oxygen cannula and tank supplies. But don't let the tank keep you from moving!! The oxygen is your friend, not your barrier to living as well and as independently as you can!  Review healthcare advance directive information provided, and bring it with you at your next visit. Keep the original, and make copies for you/your family.    Fall Prevention in the Home, Adult Falls can cause injuries. They can happen to people of all ages. There are many things you can do to make your home safe and to help prevent falls. Ask for help when making these changes, if needed. What actions can I take to prevent falls? General Instructions  Use good lighting in all rooms. Replace any light bulbs that burn out.  Turn on the lights when you go into a dark area. Use night-lights.  Keep items that you use often in easy-to-reach places. Lower the shelves around your home if necessary.  Set up your furniture so you have a clear path. Avoid moving your furniture around.  Do not have throw rugs and other things on the floor that can make you trip.  Avoid walking on wet floors.  If any of your floors are uneven, fix them.  Add color or contrast paint or tape to clearly mark and help you see: ? Any grab bars or handrails. ? First and last steps of stairways. ? Where the edge of each step is.  If you use a stepladder: ? Make sure that it is fully opened. Do not climb a closed stepladder. ? Make sure that both sides of the stepladder are locked into place. ? Ask someone to hold the stepladder for you while you use it.  If there are any pets around you, be aware of where they  are. What can I do in the bathroom?      Keep the floor dry. Clean up any water that spills onto the floor as soon as it happens.  Remove soap buildup in the tub or shower regularly.  Use non-skid mats or decals on the floor of the tub or shower.  Attach bath mats securely with double-sided, non-slip rug tape.  If you need to sit down in the shower, use a plastic, non-slip stool.  Install grab bars by the toilet and in the tub and shower. Do not use towel bars as grab bars. What can I do in the bedroom?  Make sure that you have a light by your bed that is easy to reach.  Do not use any sheets or blankets that are too big for your bed. They should not hang down onto the floor.  Have a firm chair that has side arms. You can use this for support while you get dressed. What can I do in the kitchen?  Clean up any spills right away.  If you need to reach something above you, use a strong step stool that has a grab bar.  Keep electrical cords out of the way.  Do not use floor polish or wax that makes floors slippery. If you must use wax, use non-skid floor wax. What can I do with my stairs?  Do  not leave any items on the stairs.  Make sure that you have a light switch at the top of the stairs and the bottom of the stairs. If you do not have them, ask someone to add them for you.  Make sure that there are handrails on both sides of the stairs, and use them. Fix handrails that are broken or loose. Make sure that handrails are as long as the stairways.  Install non-slip stair treads on all stairs in your home.  Avoid having throw rugs at the top or bottom of the stairs. If you do have throw rugs, attach them to the floor with carpet tape.  Choose a carpet that does not hide the edge of the steps on the stairway.  Check any carpeting to make sure that it is firmly attached to the stairs. Fix any carpet that is loose or worn. What can I do on the outside of my home?  Use bright  outdoor lighting.  Regularly fix the edges of walkways and driveways and fix any cracks.  Remove anything that might make you trip as you walk through a door, such as a raised step or threshold.  Trim any bushes or trees on the path to your home.  Regularly check to see if handrails are loose or broken. Make sure that both sides of any steps have handrails.  Install guardrails along the edges of any raised decks and porches.  Clear walking paths of anything that might make someone trip, such as tools or rocks.  Have any leaves, snow, or ice cleared regularly.  Use sand or salt on walking paths during winter.  Clean up any spills in your garage right away. This includes grease or oil spills. What other actions can I take?  Wear shoes that: ? Have a low heel. Do not wear high heels. ? Have rubber bottoms. ? Are comfortable and fit you well. ? Are closed at the toe. Do not wear open-toe sandals.  Use tools that help you move around (mobility aids) if they are needed. These include: ? Canes. ? Walkers. ? Scooters. ? Crutches.  Review your medicines with your doctor. Some medicines can make you feel dizzy. This can increase your chance of falling. Ask your doctor what other things you can do to help prevent falls. Where to find more information  Centers for Disease Control and Prevention, [facility]: https://garcia.biz/  [employer] on Aging: BrainJudge.co.uk Contact a doctor if:  You are afraid of falling at home.  You feel weak, drowsy, or dizzy at home.  You fall at home. Summary  There are many simple things that you can do to make your home safe and to help prevent falls.  Ways to make your home safe include removing tripping hazards and installing grab bars in the bathroom.  Ask for help when making these changes in your home. This information is not intended to replace advice given to you by your health care provider. Make sure you discuss any  questions you have with your health care provider. Document Released: 09/21/2009 Document Revised: 07/10/2017 Document Reviewed: 07/10/2017 Elsevier Interactive Patient Education  2019 [place] Maintenance, Female Adopting a healthy lifestyle and getting preventive care can go a long way to promote health and wellness. Talk with your health care provider about what schedule of regular examinations is right for you. This is a good chance for you to check in with your provider about disease prevention and staying healthy. In between checkups,  there are plenty of things you can do on your own. Experts have done a lot of research about which lifestyle changes and preventive measures are most likely to keep you healthy. Ask your health care provider for more information. Weight and diet Eat a healthy diet  Be sure to include plenty of vegetables, fruits, low-fat dairy products, and lean protein.  Do not eat a lot of foods high in solid fats, added sugars, or salt.  Get regular exercise. This is one of the most important things you can do for your health. ? Most adults should exercise for at least 150 minutes each week. The exercise should increase your heart rate and make you sweat (moderate-intensity exercise). ? Most adults should also do strengthening exercises at least twice a week. This is in addition to the moderate-intensity exercise. Maintain a healthy weight  Body mass index (BMI) is a measurement that can be used to identify possible weight problems. It estimates body fat based on height and weight. Your health care provider can help determine your BMI and help you achieve or maintain a healthy weight.  For females 63 years of age and older: ? A BMI below 18.5 is considered underweight. ? A BMI of 18.5 to 24.9 is normal. ? A BMI of 25 to 29.9 is considered overweight. ? A BMI of 30 and above is considered obese. Watch levels of cholesterol and blood lipids  You should  start having your blood tested for lipids and cholesterol at 83 years of age, then have this test every 5 years.  You may need to have your cholesterol levels checked more often if: ? Your lipid or cholesterol levels are high. ? You are older than 83 years of age. ? You are at high risk for heart disease. Cancer screening Lung Cancer  Lung cancer screening is recommended for adults 13-62 years old who are at high risk for lung cancer because of a history of smoking.  A yearly low-dose CT scan of the lungs is recommended for people who: ? Currently smoke. ? Have quit within the past 15 years. ? Have at least a 30-pack-year history of smoking. A pack year is smoking an average of one pack of cigarettes a day for 1 year.  Yearly screening should continue until it has been 15 years since you quit.  Yearly screening should stop if you develop a health problem that would prevent you from having lung cancer treatment. Breast Cancer  Practice breast self-awareness. This means understanding how your breasts normally appear and feel.  It also means doing regular breast self-exams. Let your health care provider know about any changes, no matter how small.  If you are in your 20s or 30s, you should have a clinical breast exam (CBE) by a health care provider every 1-3 years as part of a regular health exam.  If you are 40 or older, have a CBE every year. Also consider having a breast X-ray (mammogram) every year.  If you have a family history of breast cancer, talk to your health care provider about genetic screening.  If you are at high risk for breast cancer, talk to your health care provider about having an MRI and a mammogram every year.  Breast cancer gene (BRCA) assessment is recommended for women who have family members with BRCA-related cancers. BRCA-related cancers include: ? Breast. ? Ovarian. ? Tubal. ? Peritoneal cancers.  Results of the assessment will determine the need for  genetic counseling and BRCA1 and  BRCA2 testing. Cervical Cancer Your health care provider may recommend that you be screened regularly for cancer of the pelvic organs (ovaries, uterus, and vagina). This screening involves a pelvic examination, including checking for microscopic changes to the surface of your cervix (Pap test). You may be encouraged to have this screening done every 3 years, beginning at age 64.  For women ages 52-65, health care providers may recommend pelvic exams and Pap testing every 3 years, or they may recommend the Pap and pelvic exam, combined with testing for human papilloma virus (HPV), every 5 years. Some types of HPV increase your risk of cervical cancer. Testing for HPV may also be done on women of any age with unclear Pap test results.  Other health care providers may not recommend any screening for nonpregnant women who are considered low risk for pelvic cancer and who do not have symptoms. Ask your health care provider if a screening pelvic exam is right for you.  If you have had past treatment for cervical cancer or a condition that could lead to cancer, you need Pap tests and screening for cancer for at least 20 years after your treatment. If Pap tests have been discontinued, your risk factors (such as having a new sexual partner) need to be reassessed to determine if screening should resume. Some women have medical problems that increase the chance of getting cervical cancer. In these cases, your health care provider may recommend more frequent screening and Pap tests. Colorectal Cancer  This type of cancer can be detected and often prevented.  Routine colorectal cancer screening usually begins at 83 years of age and continues through 83 years of age.  Your health care provider may recommend screening at an earlier age if you have risk factors for colon cancer.  Your health care provider may also recommend using home test kits to check for hidden blood in the  stool.  A small camera at the end of a tube can be used to examine your colon directly (sigmoidoscopy or colonoscopy). This is done to check for the earliest forms of colorectal cancer.  Routine screening usually begins at age 71.  Direct examination of the colon should be repeated every 5-10 years through 83 years of age. However, you may need to be screened more often if early forms of precancerous polyps or small growths are found. Skin Cancer  Check your skin from head to toe regularly.  Tell your health care provider about any new moles or changes in moles, especially if there is a change in a mole's shape or color.  Also tell your health care provider if you have a mole that is larger than the size of a pencil eraser.  Always use sunscreen. Apply sunscreen liberally and repeatedly throughout the day.  Protect yourself by wearing long sleeves, pants, a wide-brimmed hat, and sunglasses whenever you are outside. Heart disease, diabetes, and high blood pressure  High blood pressure causes heart disease and increases the risk of stroke. High blood pressure is more likely to develop in: ? People who have blood pressure in the high end of the normal range (130-139/85-89 mm Hg). ? People who are overweight or obese. ? People who are African American.  If you are 73-31 years of age, have your blood pressure checked every 3-5 years. If you are 31 years of age or older, have your blood pressure checked every year. You should have your blood pressure measured twice-once when you are at a hospital or clinic,  and once when you are not at a hospital or clinic. Record the average of the two measurements. To check your blood pressure when you are not at a hospital or clinic, you can use: ? An automated blood pressure machine at a pharmacy. ? A home blood pressure monitor.  If you are between 83 years and 79 years old, ask your health care provider if you should take aspirin to prevent  strokes.  Have regular diabetes screenings. This involves taking a blood sample to check your fasting blood sugar level. ? If you are at a normal weight and have a low risk for diabetes, have this test once every three years after 83 years of age. ? If you are overweight and have a high risk for diabetes, consider being tested at a younger age or more often. Preventing infection Hepatitis B  If you have a higher risk for hepatitis B, you should be screened for this virus. You are considered at high risk for hepatitis B if: ? You were born in a country where hepatitis B is common. Ask your health care provider which countries are considered high risk. ? Your parents were born in a high-risk country, and you have not been immunized against hepatitis B (hepatitis B vaccine). ? You have HIV or AIDS. ? You use needles to inject street drugs. ? You live with someone who has hepatitis B. ? You have had sex with someone who has hepatitis B. ? You get hemodialysis treatment. ? You take certain medicines for conditions, including cancer, organ transplantation, and autoimmune conditions. Hepatitis C  Blood testing is recommended for: ? Everyone born from [birth year] through 1963. ? Anyone with known risk factors for hepatitis C. Sexually transmitted infections (STIs)  You should be screened for sexually transmitted infections (STIs) including gonorrhea and chlamydia if: ? You are sexually active and are younger than 83 years of age. ? You are older than 83 years of age and your health care provider tells you that you are at risk for this type of infection. ? Your sexual activity has changed since you were last screened and you are at an increased risk for chlamydia or gonorrhea. Ask your health care provider if you are at risk.  If you do not have HIV, but are at risk, it may be recommended that you take a prescription medicine daily to prevent HIV infection. This is called pre-exposure prophylaxis  (PrEP). You are considered at risk if: ? You are sexually active and do not regularly use condoms or know the HIV status of your partner(s). ? You take drugs by injection. ? You are sexually active with a partner who has HIV. Talk with your health care provider about whether you are at high risk of being infected with HIV. If you choose to begin PrEP, you should first be tested for HIV. You should then be tested every 3 months for as long as you are taking PrEP. Pregnancy  If you are premenopausal and you may become pregnant, ask your health care provider about preconception counseling.  If you may become pregnant, take 400 to 800 micrograms (mcg) of folic acid every day.  If you want to prevent pregnancy, talk to your health care provider about birth control (contraception). Osteoporosis and menopause  Osteoporosis is a disease in which the bones lose minerals and strength with aging. This can result in serious bone fractures. Your risk for osteoporosis can be identified using a bone density scan.  If you are  41 years of age or older, or if you are at risk for osteoporosis and fractures, ask your health care provider if you should be screened.  Ask your health care provider whether you should take a calcium or vitamin D supplement to lower your risk for osteoporosis.  Menopause may have certain physical symptoms and risks.  Hormone replacement therapy may reduce some of these symptoms and risks. Talk to your health care provider about whether hormone replacement therapy is right for you. Follow these instructions at home:  Schedule regular health, dental, and eye exams.  Stay current with your immunizations.  Do not use any tobacco products including cigarettes, chewing tobacco, or electronic cigarettes.  If you are pregnant, do not drink alcohol.  If you are breastfeeding, limit how much and how often you drink alcohol.  Limit alcohol intake to no more than 1 drink per day for  nonpregnant women. One drink equals 12 ounces of beer, 5 ounces of wine, or 1 ounces of hard liquor.  Do not use street drugs.  Do not share needles.  Ask your health care provider for help if you need support or information about quitting drugs.  Tell your health care provider if you often feel depressed.  Tell your health care provider if you have ever been abused or do not feel safe at home. This information is not intended to replace advice given to you by your health care provider. Make sure you discuss any questions you have with your health care provider. Document Released: 06/10/2011 Document Revised: 05/02/2016 Document Reviewed: 08/29/2015 Elsevier Interactive Patient Education  2019 [employer].

## 2018-12-21 NOTE — Progress Notes (Signed)
HPI:   Carla Dawson is a 85 y.o. female, who is here with her daughter today to follow on recent hospitalization.  She was admitted on 12/06/2017 and discharged on 12/11/2018. COPD exacerbation.  She presented to the hospital on date of admission due to worsening shortness of breath. O2 sat at home dropped to the mid 80s.  During hospitalization Norvasc was changed to Cardizem due to atrial fibrillation with RVR during hospitalization. She states that she was asymptomatic. She is not checking BP at home.  At the time of discharge she was in sinus rhythm.  She is on Prednisone taper,taking 30 mg daily. Albuterol neb was discontinued and started on Xopenex inh. Still on Symbicort 160-4.5 mcg bid and spiriva. On supplemental O2 3 LPM, gradually she has decreased O2 to 2 LPM. She has tried to sleep without O2 supplementation, O2 sat has dropped to 85% in the middle of the night.  Exertional dyspnea, she feels comfortable at rest. Still having productive cough with clear sputum and occasionally yellowish.  Negative for fever,chills, or fatigue.  Next appt with Dr Melvyn Novas on 01/04/19.   PAD, she is on Plavix and Aspirin. Planning on vascular procedure later this month.   She has had mild nose bleed and crusty area in nasal mucosa, attributed to O2 pressure going into her nose.  Negative for gum bleeding,blood in stool, or gross hematuria.   Lab Results  Component Value Date   CREATININE 0.71 12/11/2018   BUN 16 12/11/2018   NA 141 12/11/2018   K 4.8 12/11/2018   CL 101 12/11/2018   CO2 34 (H) 12/11/2018   Lab Results  Component Value Date   WBC 12.7 (H) 12/11/2018   HGB 10.1 (L) 12/11/2018   HCT 33.1 (L) 12/11/2018   MCV 86.4 12/11/2018   PLT 441 (H) 12/11/2018    Blood culture x2 no growth in 5 days. Influenza test negative.    She has [place] arrangements , planning on PT 2 x week, nurse visits 1-2 x week, and OT 1-2 per week. She has been in a  wheel chair for a few months now, usually when she goes out of the house. At home she uses her walker.  She lives with her daughter,who will be out of town in a few weeks.Carla Dawson will stay by herself, she feels comfortable doing so.   Review of Systems  Constitutional: Positive for activity change and fatigue. Negative for appetite change and fever.  HENT: Negative for mouth sores, nosebleeds and trouble swallowing.   Eyes: Negative for redness and visual disturbance.  Respiratory: Positive for shortness of breath. Negative for cough and wheezing.   Cardiovascular: Negative for chest pain, palpitations and leg swelling.  Gastrointestinal: Negative for abdominal pain, nausea and vomiting.       Negative for changes in bowel habits.  Genitourinary: Negative for decreased urine volume, dysuria and hematuria.  Musculoskeletal: Positive for arthralgias, gait problem and myalgias.  Skin: Negative for rash and wound.  Neurological: Negative for syncope, weakness and headaches.  Psychiatric/Behavioral: Negative for confusion. The patient is not nervous/anxious.       Current Outpatient Medications on File Prior to Visit  Medication Sig Dispense Refill  . aspirin EC 81 MG tablet Take 81 mg by mouth daily.    Marland Kitchen atorvastatin (LIPITOR) 10 MG tablet Take 1 tablet (10 mg total) by mouth daily at 6 PM.    . budesonide-formoterol (SYMBICORT) 160-4.5 MCG/ACT inhaler Inhale 2 puffs  into the lungs 2 (two) times daily. 1 Inhaler 0  . cetirizine (ZYRTEC) 10 MG tablet Take 10 mg by mouth at bedtime.     . clopidogrel (PLAVIX) 75 MG tablet Take 75 mg by mouth at bedtime.     Marland Kitchen dextromethorphan (DELSYM) 30 MG/5ML liquid Take 30 mg by mouth 2 (two) times daily as needed for cough.    . dextromethorphan-guaiFENesin (MUCINEX DM) 30-600 MG 12hr tablet Take 0.5 tablets by mouth 2 (two) times daily.    . famotidine (PEPCID) 20 MG tablet Take 20 mg by mouth at bedtime.    . gabapentin (NEURONTIN) 100 MG  capsule Take 1 capsule (100 mg total) by mouth 3 (three) times daily. (Patient taking differently: Take 100 mg by mouth 3 (three) times daily as needed (for legs pain). ) 90 capsule 2  . levalbuterol (XOPENEX HFA) 45 MCG/ACT inhaler Inhale 1 puff into the lungs every 4 (four) hours as needed for wheezing. For COPD ICD 10: J44. 9 For A fib ICD: 10 I48. 9 1 Inhaler 0  . Multiple Vitamin (MULTIVITAMIN WITH MINERALS) TABS tablet Take 1 tablet by mouth at bedtime.     Marland Kitchen nystatin (MYCOSTATIN) 100000 UNIT/ML suspension Take 5 mLs (500,000 Units total) by mouth 4 (four) times daily for 14 days. 300 mL 0  . omeprazole (PRILOSEC) 20 MG capsule Take 20 mg by mouth daily.    . predniSONE (DELTASONE) 10 MG tablet Take 40mg  daily for 3days,Take 30mg  daily for 3days,Take 20mg  daily for 3days,Take 10mg  daily for 3days, then stop 30 tablet 0  . sodium chloride (OCEAN) 0.65 % SOLN nasal spray Place 1 spray into both nostrils as needed for congestion. 1 Bottle 0  . SPIRIVA RESPIMAT 2.5 MCG/ACT AERS INHALE 2 PUFFS INTO THE LUNGS DAILY (Patient taking differently: Inhale 2 puffs into the lungs daily. ) 1 Inhaler 11   No current facility-administered medications on file prior to visit.      Past Medical History:  Diagnosis Date  . Allergy    "maybe seasonal allergies" (07/08/2017)  . Arthritis    "hands" (07/08/2017)  . Chicken pox   . COPD (chronic obstructive pulmonary disease) ([place])   . High cholesterol    "took me off pills ~ 1-2 months ago" (07/08/2017)  . History of blood transfusion 2017   "when I had blood clot in my leg"  . Hypertension   . PAD (peripheral artery disease) ([place])    severe/notes 10/14/2016  . Pneumonia ~ 2015  . PVD (peripheral vascular disease) (HCC)    Allergies  Allergen Reactions  . Lortab [Hydrocodone-Acetaminophen] Shortness Of Breath and Itching  . Morphine And Related Other (See Comments)    Unknown reaction    Social History   Socioeconomic History  . Marital status:  Widowed    Spouse name: Not on file  . Number of children: Not on file  . Years of education: Not on file  . Highest education level: Not on file  Occupational History  . Not on file  Social Needs  . Financial resource strain: Not on file  . Food insecurity:    Worry: Patient refused    Inability: Patient refused  . Transportation needs:    Medical: Patient refused    Non-medical: Patient refused  Tobacco Use  . Smoking status: Former Smoker    Packs/day: 1.50    Years: 52.00    Pack years: 78.00    Start date: 12/09/1953    Last attempt to quit:  12/09/2005    Years since quitting: 13.0  . Smokeless tobacco: Never Used  Substance and Sexual Activity  . Alcohol use: No  . Drug use: No  . Sexual activity: Not Currently  Lifestyle  . Physical activity:    Days per week: Patient refused    Minutes per session: Patient refused  . Stress: Patient refused  Relationships  . Social connections:    Talks on phone: Patient refused    Gets together: Patient refused    Attends religious service: Patient refused    Active member of club or organization: Patient refused    Attends meetings of clubs or organizations: Patient refused    Relationship status: Patient refused  Other Topics Concern  . Not on file  Social History Narrative   n/a    Vitals:   12/21/18 1039  BP: (!) 136/50  Pulse: 63  Resp: 16  Temp: 98.2 F (36.8 C)  SpO2: 91%   Body mass index is 20.24 kg/m.   Physical Exam  Nursing note and vitals reviewed. Constitutional: She is oriented to person, place, and time. She appears well-developed and well-nourished. No distress.  HENT:  Head: Normocephalic and atraumatic.  Mouth/Throat: Oropharynx is clear and moist and mucous membranes are normal.  Eyes: Pupils are equal, round, and reactive to light. Conjunctivae are normal.  Cardiovascular: Normal rate. An irregular rhythm present.  No murmur heard. Respiratory: Effort normal. No respiratory distress. She  has decreased breath sounds. She has no wheezes. She has no rales.  GI: Soft. She exhibits no mass. There is no abdominal tenderness.  Musculoskeletal:        General: No edema.  Lymphadenopathy:    She has no cervical adenopathy.  Neurological: She is alert and oriented to person, place, and time. She has normal strength. No cranial nerve deficit. Gait abnormal.  I a wheel chair.  Skin: Skin is warm. No rash noted. No erythema.  Psychiatric: She has a normal mood and affect.  Well groomed, good eye contact.    ASSESSMENT AND PLAN:  Carla. Carla Dawson was seen today for hospitalization follow-up.  Orders Placed This Encounter  Procedures  . Ambulatory referral to [place] (dyspnea on exertion) Improving. Recommend continuing supplemental O2 as needed during the day to keep O2 sat >= 89% but continues through the night. Instructed about warning signs. After discussing benefits of palliative care she agrees with referral.  -     Ambulatory referral to [place] hypertension, benign BP adequately controlled. No changes in current management. Continue low salt diet. Monitor BP at home. F/U in 4 months.   PAD (peripheral artery disease) ([place]) She is planning on vascular procedure at the end of this month. We discussed some increased of general anesthesia, given the fact she was just released from hospital dn still on supplemental O2. She will keep appointment with Dr. Melvyn Novas, pulmonologist, and discuss pre operatory risks.   Paroxysmal atrial fibrillation (HCC) Asymptomatic. Rate control today. Instructed about warning signs. Continue diltiazem CD 180 mg daily.  COPD exacerbation ([place]) Improved. Continue supplemental oxygen at 2 LPM, she can use it during the day as needed but recommend having it through the night. No changes in Symbicort 160-4.5 mcg or Xopenex inhaler, we discussed some side effects. Keep appointment with her pulmonologist on  01/04/2019.    Return in about 4 months (around 04/21/2019) for HTN.     G. Martinique, MD  [facility]. [place] office.

## 2018-12-21 NOTE — Assessment & Plan Note (Signed)
Asymptomatic. Rate control today. Instructed about warning signs. Continue diltiazem CD 180 mg daily.

## 2018-12-22 ENCOUNTER — Encounter: Payer: Self-pay | Admitting: *Deleted

## 2018-12-22 ENCOUNTER — Telehealth: Payer: Self-pay | Admitting: Family Medicine

## 2018-12-22 DIAGNOSIS — D638 Anemia in other chronic diseases classified elsewhere: Secondary | ICD-10-CM | POA: Diagnosis not present

## 2018-12-22 DIAGNOSIS — I480 Paroxysmal atrial fibrillation: Secondary | ICD-10-CM | POA: Diagnosis not present

## 2018-12-22 DIAGNOSIS — I739 Peripheral vascular disease, unspecified: Secondary | ICD-10-CM | POA: Diagnosis not present

## 2018-12-22 DIAGNOSIS — G63 Polyneuropathy in diseases classified elsewhere: Secondary | ICD-10-CM | POA: Diagnosis not present

## 2018-12-22 DIAGNOSIS — J9621 Acute and chronic respiratory failure with hypoxia: Secondary | ICD-10-CM | POA: Diagnosis not present

## 2018-12-22 DIAGNOSIS — J441 Chronic obstructive pulmonary disease with (acute) exacerbation: Secondary | ICD-10-CM | POA: Diagnosis not present

## 2018-12-22 NOTE — Telephone Encounter (Signed)
Message sent to Dr. Jordan for review. 

## 2018-12-22 NOTE — Telephone Encounter (Signed)
Copied from [place] [id number]. Topic: Quick Communication - Home Health Verbal Orders >> Dec 22, 2018 11:06 AM Scherrie Gerlach wrote: Caller/Agency: [facility]//[place] Callback Number: [phone] Would like verbal to enroll pt in palliative care

## 2018-12-22 NOTE — Telephone Encounter (Signed)
Message sent to Dr. Jordan for review and approval. 

## 2018-12-22 NOTE — Telephone Encounter (Signed)
I did place referral for palliative care, so verbalization can be given. Thanks, BJ

## 2018-12-22 NOTE — Telephone Encounter (Signed)
This encounter was created in error - please disregard.

## 2018-12-22 NOTE — Telephone Encounter (Signed)
Continue monitoring BP. Thanks, BJ

## 2018-12-22 NOTE — Telephone Encounter (Signed)
'  Angie' RN with [facility] calling to report pt's BP 148/60 this AM. States no symptoms.  Other values.. 126/68 on 12/17/2018  136/66 on 12/13/2018  Pt has been taking all meds as ordered.  Informed HHRN [place] would route information to practice, any further instructions she would hear from office.

## 2018-12-23 DIAGNOSIS — I480 Paroxysmal atrial fibrillation: Secondary | ICD-10-CM | POA: Diagnosis not present

## 2018-12-23 DIAGNOSIS — J441 Chronic obstructive pulmonary disease with (acute) exacerbation: Secondary | ICD-10-CM | POA: Diagnosis not present

## 2018-12-23 DIAGNOSIS — J9621 Acute and chronic respiratory failure with hypoxia: Secondary | ICD-10-CM | POA: Diagnosis not present

## 2018-12-23 DIAGNOSIS — D638 Anemia in other chronic diseases classified elsewhere: Secondary | ICD-10-CM | POA: Diagnosis not present

## 2018-12-23 DIAGNOSIS — I739 Peripheral vascular disease, unspecified: Secondary | ICD-10-CM | POA: Diagnosis not present

## 2018-12-23 DIAGNOSIS — G63 Polyneuropathy in diseases classified elsewhere: Secondary | ICD-10-CM | POA: Diagnosis not present

## 2018-12-23 NOTE — Telephone Encounter (Signed)
Verbal orders given to Manus Gunning as requested for palliative care.

## 2018-12-23 NOTE — Telephone Encounter (Signed)
Left detailed message with instructions per Dr. Martinique. Advised to call office with any questions.

## 2018-12-24 ENCOUNTER — Other Ambulatory Visit (HOSPITAL_COMMUNITY): Payer: Medicare Other

## 2018-12-24 DIAGNOSIS — J441 Chronic obstructive pulmonary disease with (acute) exacerbation: Secondary | ICD-10-CM | POA: Diagnosis not present

## 2018-12-24 DIAGNOSIS — J9621 Acute and chronic respiratory failure with hypoxia: Secondary | ICD-10-CM | POA: Diagnosis not present

## 2018-12-24 DIAGNOSIS — I480 Paroxysmal atrial fibrillation: Secondary | ICD-10-CM | POA: Diagnosis not present

## 2018-12-24 DIAGNOSIS — D638 Anemia in other chronic diseases classified elsewhere: Secondary | ICD-10-CM | POA: Diagnosis not present

## 2018-12-24 DIAGNOSIS — I739 Peripheral vascular disease, unspecified: Secondary | ICD-10-CM | POA: Diagnosis not present

## 2018-12-24 DIAGNOSIS — G63 Polyneuropathy in diseases classified elsewhere: Secondary | ICD-10-CM | POA: Diagnosis not present

## 2018-12-25 DIAGNOSIS — J9621 Acute and chronic respiratory failure with hypoxia: Secondary | ICD-10-CM | POA: Diagnosis not present

## 2018-12-25 DIAGNOSIS — J441 Chronic obstructive pulmonary disease with (acute) exacerbation: Secondary | ICD-10-CM | POA: Diagnosis not present

## 2018-12-25 DIAGNOSIS — I480 Paroxysmal atrial fibrillation: Secondary | ICD-10-CM | POA: Diagnosis not present

## 2018-12-25 DIAGNOSIS — G63 Polyneuropathy in diseases classified elsewhere: Secondary | ICD-10-CM | POA: Diagnosis not present

## 2018-12-25 DIAGNOSIS — D638 Anemia in other chronic diseases classified elsewhere: Secondary | ICD-10-CM | POA: Diagnosis not present

## 2018-12-25 DIAGNOSIS — I739 Peripheral vascular disease, unspecified: Secondary | ICD-10-CM | POA: Diagnosis not present

## 2018-12-27 DIAGNOSIS — D638 Anemia in other chronic diseases classified elsewhere: Secondary | ICD-10-CM | POA: Diagnosis not present

## 2018-12-27 DIAGNOSIS — J9621 Acute and chronic respiratory failure with hypoxia: Secondary | ICD-10-CM | POA: Diagnosis not present

## 2018-12-27 DIAGNOSIS — I739 Peripheral vascular disease, unspecified: Secondary | ICD-10-CM | POA: Diagnosis not present

## 2018-12-27 DIAGNOSIS — G63 Polyneuropathy in diseases classified elsewhere: Secondary | ICD-10-CM | POA: Diagnosis not present

## 2018-12-27 DIAGNOSIS — I480 Paroxysmal atrial fibrillation: Secondary | ICD-10-CM | POA: Diagnosis not present

## 2018-12-27 DIAGNOSIS — J441 Chronic obstructive pulmonary disease with (acute) exacerbation: Secondary | ICD-10-CM | POA: Diagnosis not present

## 2018-12-28 DIAGNOSIS — J441 Chronic obstructive pulmonary disease with (acute) exacerbation: Secondary | ICD-10-CM | POA: Diagnosis not present

## 2018-12-28 DIAGNOSIS — I739 Peripheral vascular disease, unspecified: Secondary | ICD-10-CM | POA: Diagnosis not present

## 2018-12-28 DIAGNOSIS — D638 Anemia in other chronic diseases classified elsewhere: Secondary | ICD-10-CM | POA: Diagnosis not present

## 2018-12-28 DIAGNOSIS — J9621 Acute and chronic respiratory failure with hypoxia: Secondary | ICD-10-CM | POA: Diagnosis not present

## 2018-12-28 DIAGNOSIS — G63 Polyneuropathy in diseases classified elsewhere: Secondary | ICD-10-CM | POA: Diagnosis not present

## 2018-12-28 DIAGNOSIS — I480 Paroxysmal atrial fibrillation: Secondary | ICD-10-CM | POA: Diagnosis not present

## 2018-12-29 DIAGNOSIS — D638 Anemia in other chronic diseases classified elsewhere: Secondary | ICD-10-CM | POA: Diagnosis not present

## 2018-12-29 DIAGNOSIS — G63 Polyneuropathy in diseases classified elsewhere: Secondary | ICD-10-CM | POA: Diagnosis not present

## 2018-12-29 DIAGNOSIS — I739 Peripheral vascular disease, unspecified: Secondary | ICD-10-CM | POA: Diagnosis not present

## 2018-12-29 DIAGNOSIS — J441 Chronic obstructive pulmonary disease with (acute) exacerbation: Secondary | ICD-10-CM | POA: Diagnosis not present

## 2018-12-29 DIAGNOSIS — I480 Paroxysmal atrial fibrillation: Secondary | ICD-10-CM | POA: Diagnosis not present

## 2018-12-29 DIAGNOSIS — J9621 Acute and chronic respiratory failure with hypoxia: Secondary | ICD-10-CM | POA: Diagnosis not present

## 2018-12-30 NOTE — Pre-Procedure Instructions (Signed)
Carla Dawson  12/30/2018      [facility] #[id number] - [place], [place] - [address] [place] [address] Phone: [phone] Fax: [phone]    Your procedure is scheduled on January 29  Report to [place] at [place].M.  Call this number if you have problems the morning of surgery:  [phone]   Remember:  Do not eat or drink after midnight.    Take these medicines the morning of surgery with A SIP OF WATER  Aspirin budesonide-formoterol (SYMBICORT) diltiazem (CARDIZEM CD)  gabapentin (NEURONTIN)  levalbuterol (XOPENEX HFA) omeprazole (PRILOSEC)  SPIRIVA RESPIMAT predniSONE (DELTASONE)   Bring all inhalers with you the day of surgery  7 days prior to surgery STOP taking any Aspirin (unless otherwise instructed by your surgeon), Aleve, Naproxen, Ibuprofen, Motrin, Advil, Goody's, BC's, all herbal medications, fish oil, and all vitamins.  Follow instructions from your physicians about when to stop your plavix    Do not wear jewelry, make-up or nail polish.  Do not wear lotions, powders, or perfumes, or deodorant.  Do not shave 48 hours prior to surgery.   Do not bring valuables to the hospital.  [facility] is not responsible for any belongings or valuables.  Contacts, dentures or bridgework may not be worn into surgery.  Leave your suitcase in the car.  After surgery it may be brought to your room.  For patients admitted to the hospital, discharge time will be determined by your treatment team.  Patients discharged the day of surgery will not be allowed to drive home.    Special instructions:   [place] [facility]- Preparing For Surgery  Before surgery, you can play an important role. Because skin is not sterile, your skin needs to be as free of germs as possible. You can reduce the number of germs on your skin by washing with CHG (chlorahexidine gluconate) Soap  before surgery.  CHG is an antiseptic cleaner which kills germs and bonds with the skin to continue killing germs even after washing.    Oral Hygiene is also important to reduce your risk of infection.  Remember - BRUSH YOUR TEETH THE MORNING OF SURGERY WITH YOUR REGULAR TOOTHPASTE  Please do not use if you have an allergy to CHG or antibacterial soaps. If your skin becomes reddened/irritated stop using the CHG.  Do not shave (including legs and underarms) for at least 48 hours prior to first CHG shower. It is OK to shave your face.  Please follow these instructions carefully.   1. Shower the NIGHT BEFORE SURGERY and the MORNING OF SURGERY with CHG.   2. If you chose to wash your hair, wash your hair first as usual with your normal shampoo.  3. After you shampoo, rinse your hair and body thoroughly to remove the shampoo.  4. Use CHG as you would any other liquid soap. You can apply CHG directly to the skin and wash gently with a scrungie or a clean washcloth.   5. Apply the CHG Soap to your body ONLY FROM THE NECK DOWN.  Do not use on open wounds or open sores. Avoid contact with your eyes, ears, mouth and genitals (private parts). Wash Face and genitals (private parts)  with your normal soap.  6. Wash thoroughly, paying special attention to the area where your surgery will be performed.  7. Thoroughly rinse your body with warm  water from the neck down.  8. DO NOT shower/wash with your normal soap after using and rinsing off the CHG Soap.  9. Pat yourself dry with a CLEAN TOWEL.  10. Wear CLEAN PAJAMAS to bed the night before surgery, wear comfortable clothes the morning of surgery  11. Place CLEAN SHEETS on your bed the night of your first shower and DO NOT SLEEP WITH PETS.    Day of Surgery:  Do not apply any deodorants/lotions.  Please wear clean clothes to the hospital/surgery center.   Remember to brush your teeth WITH YOUR REGULAR TOOTHPASTE.    Please read over the  following fact sheets that you were given.

## 2018-12-30 NOTE — Progress Notes (Signed)
I have reviewed available documentation from this visit and I agree with recommendations given.  Dollye Glasser G. Marcellina Jonsson, MD  [facility]. [place] office.  

## 2018-12-31 ENCOUNTER — Encounter (HOSPITAL_COMMUNITY)
Admission: RE | Admit: 2018-12-31 | Discharge: 2018-12-31 | Disposition: A | Discharge status: Home / Self Care | Payer: Medicare Other | Source: Ambulatory Visit | Attending: Surgery | Admitting: Surgery

## 2018-12-31 ENCOUNTER — Encounter (HOSPITAL_COMMUNITY): Payer: Self-pay

## 2018-12-31 ENCOUNTER — Other Ambulatory Visit: Payer: Self-pay

## 2018-12-31 DIAGNOSIS — J9621 Acute and chronic respiratory failure with hypoxia: Secondary | ICD-10-CM | POA: Diagnosis not present

## 2018-12-31 DIAGNOSIS — Z01812 Encounter for preprocedural laboratory examination: Secondary | ICD-10-CM | POA: Diagnosis not present

## 2018-12-31 DIAGNOSIS — J441 Chronic obstructive pulmonary disease with (acute) exacerbation: Secondary | ICD-10-CM | POA: Diagnosis not present

## 2018-12-31 DIAGNOSIS — D638 Anemia in other chronic diseases classified elsewhere: Secondary | ICD-10-CM | POA: Diagnosis not present

## 2018-12-31 DIAGNOSIS — G63 Polyneuropathy in diseases classified elsewhere: Secondary | ICD-10-CM | POA: Diagnosis not present

## 2018-12-31 DIAGNOSIS — I739 Peripheral vascular disease, unspecified: Secondary | ICD-10-CM | POA: Diagnosis not present

## 2018-12-31 DIAGNOSIS — I480 Paroxysmal atrial fibrillation: Secondary | ICD-10-CM | POA: Diagnosis not present

## 2018-12-31 HISTORY — DX: Cardiac arrhythmia, unspecified: I49.9

## 2018-12-31 HISTORY — DX: Gastro-esophageal reflux disease without esophagitis: K21.9

## 2018-12-31 HISTORY — DX: Dyspnea, unspecified: R06.00

## 2018-12-31 LAB — COMPREHENSIVE METABOLIC PANEL
ALT: 14 U/L (ref 0–44)
ANION GAP: 9 (ref 5–15)
AST: 28 U/L (ref 15–41)
Albumin: 3.2 g/dL — ABNORMAL LOW (ref 3.5–5.0)
Alkaline Phosphatase: 88 U/L (ref 38–126)
BUN: 8 mg/dL (ref 8–23)
CO2: 26 mmol/L (ref 22–32)
Calcium: 8.9 mg/dL (ref 8.9–10.3)
Chloride: 103 mmol/L (ref 98–111)
Creatinine, Ser: 0.79 mg/dL (ref 0.44–1.00)
GFR calc Af Amer: >60 mL/min (ref >60)
GFR calc non Af Amer: >60 mL/min (ref >60)
Glucose, Bld: 97 mg/dL (ref 70–99)
POTASSIUM: 3.8 mmol/L (ref 3.5–5.1)
Sodium: 138 mmol/L (ref 135–145)
Total Bilirubin: 0.4 mg/dL (ref 0.3–1.2)
Total Protein: 6.6 g/dL (ref 6.5–8.1)

## 2018-12-31 LAB — CBC
HCT: 32.7 % — ABNORMAL LOW (ref 36.0–46.0)
Hemoglobin: 9.7 g/dL — ABNORMAL LOW (ref 12.0–15.0)
MCH: 25.9 pg — ABNORMAL LOW (ref 26.0–34.0)
MCHC: 29.7 g/dL — ABNORMAL LOW (ref 30.0–36.0)
MCV: 87.2 fL (ref 80.0–100.0)
Platelets: 322 10*3/uL (ref 150–400)
RBC: 3.75 MIL/uL — ABNORMAL LOW (ref 3.87–5.11)
RDW: 16.2 % — AB (ref 11.5–15.5)
WBC: 9.4 10*3/uL (ref 4.0–10.5)
nRBC: 0 % (ref 0.0–0.2)

## 2018-12-31 LAB — URINALYSIS, ROUTINE W REFLEX MICROSCOPIC
Bacteria, UA: NONE SEEN
Bilirubin Urine: NEGATIVE
Glucose, UA: NEGATIVE mg/dL
Ketones, ur: NEGATIVE mg/dL
Leukocytes, UA: NEGATIVE
Nitrite: NEGATIVE
Protein, ur: NEGATIVE mg/dL
Specific Gravity, Urine: 1.005 (ref 1.005–1.030)
pH: 6 (ref 5.0–8.0)

## 2018-12-31 LAB — SURGICAL PCR SCREEN
MRSA, PCR: NEGATIVE
Staphylococcus aureus: NEGATIVE

## 2018-12-31 LAB — PROTIME-INR
INR: 1.05
Prothrombin Time: 13.6 seconds (ref 11.4–15.2)

## 2018-12-31 LAB — APTT: aPTT: 32 seconds (ref 24–36)

## 2018-12-31 NOTE — Pre-Procedure Instructions (Signed)
Carla Dawson  12/31/2018      [facility] #[id number] - [place], [place] - [address] [place] [address] Phone: [phone] Fax: [phone]    Your procedure is scheduled on Wednesday,  January 29   Report to [place] at [place].M.             (posted surgery time 7:30a - 10:10a)   Call this number if you have problems the morning of surgery:  [phone]   Remember:  Do not eat any foods or drink any liquids after midnight, Tuesday.    Take these medicines the morning of surgery with A SIP OF WATER  Aspirin budesonide-formoterol (SYMBICORT) diltiazem (CARDIZEM CD)  gabapentin (NEURONTIN)  levalbuterol (XOPENEX HFA) omeprazole (PRILOSEC)  SPIRIVA RESPIMAT predniSONE (DELTASONE)   Bring all inhalers with you the day of surgery  7 days prior to surgery STOP taking any Aspirin (unless otherwise instructed by your surgeon), Aleve, Naproxen, Ibuprofen, Motrin, Advil, Goody's, BC's, all herbal medications, fish oil, and all vitamins.  Follow instructions from your physicians about when to stop your plavix    Do not wear jewelry, make-up or nail polish.  Do not wear lotions, powders, or perfumes, or deodorant.  Do not shave 48 hours prior to surgery.   Do not bring valuables to the hospital.  [facility] is not responsible for any belongings or valuables.  Contacts, dentures or bridgework may not be worn into surgery.  Leave your suitcase in the car.  After surgery it may be brought to your room.  For patients admitted to the hospital, discharge time will be determined by your treatment team.      Special instructions:   [facility]- Preparing For Surgery  Before surgery, you can play an important role. Because skin is not sterile, your skin needs to be as free of germs as possible. You can reduce the number of germs on your skin by washing with CHG  (chlorahexidine gluconate) Soap before surgery.  CHG is an antiseptic cleaner which kills germs and bonds with the skin to continue killing germs even after washing.    Oral Hygiene is also important to reduce your risk of infection.    Remember - BRUSH YOUR TEETH THE MORNING OF SURGERY WITH YOUR REGULAR TOOTHPASTE  Please do not use if you have an allergy to CHG or antibacterial soaps. If your skin becomes reddened/irritated stop using the CHG.  Do not shave (including legs and underarms) for at least 48 hours prior to first CHG shower. It is OK to shave your face.  Please follow these instructions carefully.   1. Shower the NIGHT BEFORE SURGERY and the MORNING OF SURGERY with CHG.   2. If you chose to wash your hair, wash your hair first as usual with your normal shampoo.  3. After you shampoo, rinse your hair and body thoroughly to remove the shampoo.  4. Use CHG as you would any other liquid soap. You can apply CHG directly to the skin and wash gently with a scrungie or a clean washcloth.   5. Apply the CHG Soap to your body ONLY FROM THE NECK DOWN.  Do not use on open wounds or open sores. Avoid contact with your eyes, ears, mouth and genitals (private parts). Wash Face and genitals (private parts)  with your normal soap.  6. Wash thoroughly, paying special attention  to the area where your surgery will be performed.  7. Thoroughly rinse your body with warm water from the neck down.  8. DO NOT shower/wash with your normal soap after using and rinsing off the CHG Soap.  9. Pat yourself dry with a CLEAN TOWEL.  10. Wear CLEAN PAJAMAS to bed the night before surgery, wear comfortable clothes the morning of surgery  11. Place CLEAN SHEETS on your bed the night of your first shower and DO NOT SLEEP WITH PETS.    Day of Surgery:  Do not apply any deodorants/lotions.  Please wear clean clothes to the hospital/surgery center.   Remember to brush your teeth WITH YOUR REGULAR  TOOTHPASTE.    Please read over the following fact sheets that you were given.

## 2018-12-31 NOTE — Progress Notes (Addendum)
PCP - Dr. B Martinique  LOV 12/2018  Cardiologist -  Dr. Venida Jarvis  lov 11/2018  Chest x-ray - 11/2018   EKG - 12/09/2018  Stress Test -  N/a   ECHO - [id number]  Cardiac Cath - n/a  Sleep Study - n/a CPAP -   Fasting Blood Sugar - n/a Checks Blood Sugar _____ times a day  Blood Thinner Instructions: her last dose of plavix will be 01/01/2019 Was initially started on plavix by a Dr. Abbie Sons in [place], [place]  Aspirin Instructions: Continue aspirin per Dr. Trula Slade  Anesthesia review:   Patient denies shortness of breath, fever, cough and chest pain at PAT appointment   Patient verbalized understanding of instructions that were given to them at the PAT appointment. Patient was also instructed that they will need to review over the PAT instructions again at home before surgery.

## 2019-01-01 DIAGNOSIS — D638 Anemia in other chronic diseases classified elsewhere: Secondary | ICD-10-CM | POA: Diagnosis not present

## 2019-01-01 DIAGNOSIS — J9621 Acute and chronic respiratory failure with hypoxia: Secondary | ICD-10-CM | POA: Diagnosis not present

## 2019-01-01 DIAGNOSIS — J441 Chronic obstructive pulmonary disease with (acute) exacerbation: Secondary | ICD-10-CM | POA: Diagnosis not present

## 2019-01-01 DIAGNOSIS — I480 Paroxysmal atrial fibrillation: Secondary | ICD-10-CM | POA: Diagnosis not present

## 2019-01-01 DIAGNOSIS — I739 Peripheral vascular disease, unspecified: Secondary | ICD-10-CM | POA: Diagnosis not present

## 2019-01-01 DIAGNOSIS — G63 Polyneuropathy in diseases classified elsewhere: Secondary | ICD-10-CM | POA: Diagnosis not present

## 2019-01-01 NOTE — Progress Notes (Signed)
Anesthesia Chart Review:  Case:  [id number] Date/Time:  01/06/19 0715   Procedure:  REDO LEFT FEMORAL ENDARTERECTOMY (Left )   Anesthesia type:  General   Pre-op diagnosis:  PERIPHERAL VASCULAR DISEASE WITH ULCER TO LEFT HEEL   Location:  [facility] OR ROOM 11 / [place] [facility]   Surgeon:  Serafina Mitchell, MD      DISCUSSION:85 yo female former smoker. Pertinent hx includes COPD Gold III, HTN, PVD, GERD, PAD, DOE, pAfib, Anemia.  Seen 11/16/2018 by Dr. Harrell Gave to eval palpitations. Echo was ordered and showed EF 65-70%, normal wall motion, grade 1 dd, normal valves. She also wore a Ziopatch which showed No VT, atrial fibrillation, high degree block, or pauses noted. No ventricular ectopy. There were 0 patient triggered events. Patient had a min HR of 64 bpm, max HR of 266 bpm, and avg HR of 81 bpm. Predominant underlying rhythm was Sinus Rhythm. 1727 Supraventricular Tachycardia runs occurred, the run with the fastest interval lasting 6 beats with a max rate of 266 bpm, the longest lasting 58.9 secs with an avg rate of 166 bpm. Isolated SVEs were frequent (8.6%, 77116), SVE Couplets were occasional (1.1%, 4558), and SVE Triplets were rare (<1.0%, 344). She is now on cardizem, no Bblocker due to COPD.  She was admitted on 12/06/2017 and discharged on 12/11/2018 for COPD exacerbation. Presented to the hospital due to worsening SOB and O2 sats in the 80s. During hospitalization Norvasc was changed to Cardizem due to atrial fibrillation with RVR. At the time of discharge she was in sinus rhythm. She was discharged on home oxygen, which was new, and continues to use PRN.  Pt seen by PCP Dr. Betty Martinique 12/21/2018, discussed upcoming surgery, recommended she keep f/u with Dr. Melvyn Novas to discuss need for continued O2.  Seen by Dr. Melvyn Novas 01/04/2019. Per his note she has improved but is not quite back to baseline after recent COPD exacerbation, she is using albuterol inhaler 3x/week. She is using 3L O2 QHS, not using with exertion  - recommended to use with activity to keep O2 sats >90%.  I spoke with Dr. Melvyn Novas and he advised that the pt is essentially back to baseline from her recent exacerbation and he does not expect her status will improve significantly in the near future. He said she is frail but essentially optimized from a pulmonary standpoint.  Anticipate she can proceed as planned barring acute status change.   VS: BP (!) 152/54   Pulse 77   Temp 36.6 C   Resp 18   Ht 5\' 1"  (1.549 m)   Wt 48.2 kg   SpO2 97%   BMI 20.09 kg/m   PROVIDERS: Martinique, Betty G, MD is PCP  Christinia Gully, MD is Pulmonologist  Buford Dresser, MD is Cardiologist  LABS: Labs reviewed: Acceptable for surgery. (all labs ordered are listed, but only abnormal results are displayed)  Labs Reviewed  CBC - Abnormal; Notable for the following components:      Result Value   RBC 3.75 (*)    Hemoglobin 9.7 (*)    HCT 32.7 (*)    MCH 25.9 (*)    MCHC 29.7 (*)    RDW 16.2 (*)    All other components within normal limits  COMPREHENSIVE METABOLIC PANEL - Abnormal; Notable for the following components:   Albumin 3.2 (*)    All other components within normal limits  URINALYSIS, ROUTINE W REFLEX MICROSCOPIC - Abnormal; Notable for the following components:   Hgb  urine dipstick SMALL (*)    All other components within normal limits  SURGICAL PCR SCREEN  APTT  PROTIME-INR  TYPE AND SCREEN     IMAGES: CHEST - 2 VIEW 12/06/2018:  COMPARISON:  Chest x-ray 11/25/2018.  FINDINGS: Lung volumes are slightly increased with emphysematous changes. No consolidative airspace disease. No pleural effusions. No pneumothorax. No pulmonary nodule or mass noted. Pulmonary vasculature and the cardiomediastinal silhouette are within normal limits. Atherosclerosis in the thoracic aorta. Electronic device projecting over the upper left hemithorax again noted.  IMPRESSION: 1. No radiographic evidence of acute cardiopulmonary  disease. 2. Aortic atherosclerosis. 3. Emphysema.  EKG: 12/09/2018: Sinus rhythm with Premature atrial complexes.  Rate 86.  CV: Zio patch 11/24/2018: ~7 days of data recorded on Zio monitor. No VT, atrial fibrillation, high degree block, or pauses noted. No ventricular ectopy. There were 0 patient triggered events. Patient had a min HR of 64 bpm, max HR of 266 bpm, and avg HR of 81 bpm. Predominant underlying rhythm was Sinus Rhythm.   1727 Supraventricular Tachycardia runs occurred, the run with the fastest interval lasting 6 beats with a max rate of 266 bpm, the longest lasting 58.9 secs with an avg rate of 166 bpm. Isolated SVEs were frequent (8.6%, 46659), SVE Couplets were occasional (1.1%, 4558), and SVE Triplets were rare (<1.0%, 344).   TTE 11/24/2018: Study Conclusions  - Left ventricle: The cavity size was normal. Wall thickness was   normal. Systolic function was vigorous. The estimated ejection   fraction was in the range of 65% to 70%. Wall motion was normal;   there were no regional wall motion abnormalities. Doppler   parameters are consistent with abnormal left ventricular   relaxation (grade 1 diastolic dysfunction). - Pericardium, extracardiac: A trivial pericardial effusion was   identified.  Past Medical History:  Diagnosis Date  . Allergy    "maybe seasonal allergies" (07/08/2017)  . Arthritis    "hands" (07/08/2017)  . Chicken pox   . COPD (chronic obstructive pulmonary disease) ([place])   . Dyspnea   . Dysrhythmia    "racing heart" - medication related  . GERD (gastroesophageal reflux disease)   . High cholesterol    "took me off pills ~ 1-2 months ago" (07/08/2017)  . History of blood transfusion 2017   "when I had blood clot in my leg"  . Hypertension   . PAD (peripheral artery disease) ([place])    severe/notes 10/14/2016  . Pneumonia ~ 2015  . PVD (peripheral vascular disease) ([place])     Past Surgical History:  Procedure Laterality Date  . ABDOMINAL  AORTOGRAM N/A 04/22/2017   Procedure: Abdominal Aortogram;  Surgeon: Serafina Mitchell, MD;  Location: [place] [facility];  Service: Cardiovascular;  Laterality: N/A;  . ABDOMINAL AORTOGRAM W/LOWER EXTREMITY N/A 03/04/2017   Procedure: Abdominal Aortogram w/Lower Extremity;  Surgeon: Serafina Mitchell, MD;  Location: [place] [facility];  Service: Cardiovascular;  Laterality: N/A;  . ABDOMINAL AORTOGRAM W/LOWER EXTREMITY N/A 07/08/2017   Procedure: Abdominal Aortogram w/Lower Extremity;  Surgeon: Serafina Mitchell, MD;  Location: [place] [facility];  Service: Cardiovascular;  Laterality: N/A;  . BIOPSY THYROID    . CATARACT EXTRACTION W/ INTRAOCULAR LENS  IMPLANT, BILATERAL Bilateral   . cataracts     bilateral  . EMBOLECTOMY Right 04/09/2018   Procedure: RIGHT COMMON FEMORAL ENDARTERECTOMY  WITH BOVINE PATCH ANGIOPLASTY; RIGHT LOWER LEG THROMBOEMBOLECTOMY INTRAOPERATIVE ARTERIOGRAM;  Surgeon: Waynetta Sandy, MD;  Location: [place];  Service:  Vascular;  Laterality: Right;  . ENDARTERECTOMY FEMORAL Right 11/04/2018   Procedure: ENDARTERECTOMY ILIO-FEMORAL ENDARTECTOMY;  Surgeon: Serafina Mitchell, MD;  Location: [place];  Service: Vascular;  Laterality: Right;  . EYE SURGERY    . FEMORAL-POPLITEAL BYPASS GRAFT Left 04/12/2018   Procedure: Vein Patch Angioplasty Below Knee Popliteal Artery; Left Femoral Endarterectomy with Profundaplasty; Left Femoral-Below Knee Popliteal Bypass Graft using a 26mm by 80cm Thin Wall Propaten Gortex Graft;  Surgeon: Elam Dutch, MD;  Location: [place];  Service: Vascular;  Laterality: Left;  . HEMATOMA EVACUATION Right 2017   "S/P procedure; discharged; had to come back for emergency OR"  . LOWER EXTREMITY ANGIOGRAPHY Bilateral 04/22/2017   Procedure: Lower Extremity Angiography;  Surgeon: Serafina Mitchell, MD;  Location: [place] [facility];  Service: Cardiovascular;  Laterality: Bilateral;  . LOWER EXTREMITY ANGIOGRAPHY Bilateral 04/07/2018   Procedure: LOWER EXTREMITY  ANGIOGRAPHY;  Surgeon: Serafina Mitchell, MD;  Location: [place] [facility];  Service: Cardiovascular;  Laterality: Bilateral;  . LOWER EXTREMITY ANGIOGRAPHY N/A 11/03/2018   Procedure: LOWER EXTREMITY ANGIOGRAPHY;  Surgeon: Serafina Mitchell, MD;  Location: [place] [facility];  Service: Cardiovascular;  Laterality: N/A;  . PATCH ANGIOPLASTY Right 11/04/2018   Procedure: PATCH ANGIOPLASTY WITH Weyman Pedro;  Surgeon: Serafina Mitchell, MD;  Location: [facility];  Service: Vascular;  Laterality: Right;  . PERIPHERAL VASCULAR BALLOON ANGIOPLASTY Left 03/04/2017   Procedure: Peripheral Vascular Balloon Angioplasty;  Surgeon: Serafina Mitchell, MD;  Location: [place] [facility];  Service: Cardiovascular;  Laterality: Left;  SFA and PT  . PERIPHERAL VASCULAR BALLOON ANGIOPLASTY Right 11/03/2018   Procedure: PERIPHERAL VASCULAR BALLOON ANGIOPLASTY;  Surgeon: Serafina Mitchell, MD;  Location: [place] [facility];  Service: Cardiovascular;  Laterality: Right;  common femoral  . PERIPHERAL VASCULAR INTERVENTION  07/08/2017  . PERIPHERAL VASCULAR INTERVENTION  07/08/2017   Procedure: Peripheral Vascular Intervention;  Surgeon: Serafina Mitchell, MD;  Location: [place] [facility];  Service: Cardiovascular;;  . PERIPHERAL VASCULAR INTERVENTION Left 04/07/2018   Procedure: PERIPHERAL VASCULAR INTERVENTION;  Surgeon: Serafina Mitchell, MD;  Location: [place] [facility];  Service: Cardiovascular;  Laterality: Left;  . THROMBECTOMY ILIAC ARTERY Right 11/04/2018   Procedure: THROMBECTOMY RIGHT ILIO AND FEMORAL-TIBIAL BYPASS;  Surgeon: Serafina Mitchell, MD;  [facility];  Service: Vascular;  Laterality: Right;  Marland Kitchen VAGINAL HYSTERECTOMY    . VEIN BYPASS SURGERY      MEDICATIONS: . aspirin EC 81 MG tablet  . atorvastatin (LIPITOR) 10 MG tablet  . budesonide-formoterol (SYMBICORT) 160-4.5 MCG/ACT inhaler  . cetirizine (ZYRTEC) 10 MG tablet  . clopidogrel (PLAVIX) 75 MG tablet  . dextromethorphan (DELSYM) 30 MG/5ML liquid   . dextromethorphan-guaiFENesin (MUCINEX DM) 30-600 MG 12hr tablet  . diltiazem (CARDIZEM CD) 180 MG 24 hr capsule  . famotidine (PEPCID) 20 MG tablet  . gabapentin (NEURONTIN) 100 MG capsule  . levalbuterol (XOPENEX HFA) 45 MCG/ACT inhaler  . Multiple Vitamin (MULTIVITAMIN WITH MINERALS) TABS tablet  . omeprazole (PRILOSEC) 20 MG capsule  . predniSONE (DELTASONE) 10 MG tablet  . sodium chloride (OCEAN) 0.65 % SOLN nasal spray  . SPIRIVA RESPIMAT 2.5 MCG/ACT AERS   No current facility-administered medications for this encounter.      Wynonia Musty [facility] Stay Center/Anesthesiology Phone [phone] 01/05/2019 3:56 PM

## 2019-01-04 ENCOUNTER — Ambulatory Visit (INDEPENDENT_AMBULATORY_CARE_PROVIDER_SITE_OTHER): Payer: Medicare Other | Admitting: Internal Medicine

## 2019-01-04 ENCOUNTER — Encounter: Payer: Self-pay | Admitting: Internal Medicine

## 2019-01-04 ENCOUNTER — Telehealth: Payer: Self-pay | Admitting: *Deleted

## 2019-01-04 VITALS — BP 124/80 | HR 78 | Ht 61.0 in | Wt 107.8 lb

## 2019-01-04 DIAGNOSIS — J9611 Chronic respiratory failure with hypoxia: Secondary | ICD-10-CM

## 2019-01-04 DIAGNOSIS — J449 Chronic obstructive pulmonary disease, unspecified: Secondary | ICD-10-CM | POA: Diagnosis not present

## 2019-01-04 NOTE — Telephone Encounter (Signed)
Call to patient's daughter and instructed arrival time to admitting on 1/29/202 changed to 6:30 am.

## 2019-01-04 NOTE — Progress Notes (Signed)
Subjective:   Patient ID: Carla Dawson, female    DOB: 07/10/1931,    MRN: [id number]     Brief patient profile:  85 yowf  Quit smoking 2007 with dx of copd in 2002 while living in [place] near [place] rx with Advair then Breo since quit smoking referred to pulmonary clinic 10/25/2016 by Dr   Betty Martinique   History of Present Illness  10/25/2016 1st [facility] office visit/ Carla Dawson   Chief Complaint  Patient presents with  . Pulmonary Consult    Referred by Dr. Betty Martinique.  Pt here to est care for COPD. She c/o SOB with walking approx 800 ft on a flat surface, eating and walking up an incline. She has some PND but not coughing much.    MMRC2 = can't walk a nl pace on a flat grade s sob but does fine slow and flat eg walking up to 800 ft - she measures the distances herself to keep tack as also limited by claudication - rarely feels need for saba  rec Work on inhaler technique:  relax and gently blow all the way out then take a nice smooth deep breath back in, triggering the inhaler at same time you start breathing in.  Hold for up to 5 seconds if you can. Blow out thru nose. Rinse and gargle with water when done Plan A = Automatic = Symbicort 160 Take 2 puffs first thing in am and then another 2 puffs about 12 hours later.  Plan B = Backup Only use your albuterol (proair) as a rescue medication Plan C = Crisis - only use your albuterol nebulizer if you first try Plan B and it fails to help > ok to use the nebulizer up to every 4 hours but if start needing it regularly call for immediate appointment   02/27/2017  f/u ov/Carla Dawson re:  Copd / 02 prn maint on advair 250 bid and prn saba  Chief Complaint  Patient presents with  . Follow-up    Pt states was hospitalized while in [place] early March 2018. She states she was told that she may have had the beginning of PNA. She states today her breathing is "great". She has occ non prod cough. She was sent home with o2, but has  not used in the past several days.   breathing worse suddenly while in [place] not responsive  To albuterol or 2lpm > admit x 4 days and back to baseline  MMRC2 = can't walk a nl pace on a flat grade s sob but does fine slow and flat  rec Plan A = Automatic = Breo one click each am > take two good deep drags off it but click it just once  Plan B = Backup Only use your albuterol (PROAIR) as a rescue medication Plan C = Crisis - only use your albuterol nebulizer if you first try Plan B         10/14/2017  f/u ov/Carla Dawson re:   GOLD III / maint on BREO daily / has 02 / not using  Chief Complaint  Patient presents with  . Follow-up    Pt plans to go to [place] soon for a month and wants to discuss plan to "keep from having to take prednisone".  She states she is using her proair rarely, but has been using her neb recently due to chest tightness.   doe =  MMRC3 = can't walk 100 yards even at a slow pace at  a flat grade s stopping due to sob  Can do HT but not sam's   Very hoarse on breo 100/ poor hfa  Sleeps ok  rec Plan A = Automatic = dulera 100 Take 2 puffs first thing in am and then another 2 puffs about 12 hours later.  Work on inhaler technique: Plan B = Backup Only use your albuterol [employer])  as a rescue medication  Plan C = Crisis - only use your albuterol nebulizer if you first try Plan B and it fails to help > ok to use the nebulizer up to every 4 hours but if start needing it regularly call for immediate appointment Plan D = Deltasone take 6 days if you are having to use plan c and not working great Plan E  = ER, go there if all elso fails      02/04/2018  f/u ov/Carla Dawson re:  COPD III /one flare in Dec 2018 / BREO maint but req one course pred in last 3 m for aecopd  Chief Complaint  Patient presents with  . Follow-up    Last night she was eating and aspirated hamburger. She states that she feels pain in her throat when she takes a deep breath.  Her breathing is unchanged. She  is using her proair 3-4 x per wk on average and rarely uses neb.   Dyspnea:  MMRC3 = can't walk 100 yards even at a slow pace at a flat grade s stopping due to sob   Cough: not normally/  Sleep: ok/ no 02  SABA use:   No more than once a day/ maybe p shower  rec Plan A = Automatic = Breo or Trelegy daily  Work on inhaler technique:   Plan B = Backup Only use your albuterol (Proair)  as a rescue medication  Plan C = Crisis - only use your albuterol nebulizer if you first try Plan B Plan D = Deltasone take 6 days if you are having to use plan c and not working great Plan E  = ER, go there if all elso fails        03/23/2018 acute extended ov/Carla Dawson re:  aecopd x 3 days/ did not start Plan D/ tol neb poorly due to tremors Chief Complaint  Patient presents with  . Acute Visit    productive cough yellow mucus, sob ,cough since 4/12   was doing about the same prior to flare 03/20/18 with cough/ congestion/ thick mucus / no fever c/p and comfortable at rest but trouble sleeping due to cough / chest tight  rec zpak  Take your refillable prednisone and remember it is plan = D = Prednisone 10 mg take  4 each am x 2 days,   2 each am x 2 days,  1 each am x 2 days and stop.       04/03/2018 acute extended ov/Carla Dawson re:  aecopd  Chief Complaint  Patient presents with  . Acute Visit    Improved slightly after visit 03/23/18 and then worsened again 2-3 days ago- cough with light yellow sputum and increased SOB. She is using her albuterol inhaler 4-5 x per day and neb at least 3 x per day.    on trelegy p zpak / prednisone was only a little  better as it turns out = ok only at rest whereas baseline = walking house x 500 steps at a time s stopping - did not less saba need p prednisone but concerned getting worse  x 3 d prior to OV  And needs vascular sugery 04/07/18 and doesn't want to be turned down  Mucus turned clear on zpak, then slt yellow and thick since despite rx with mucinex rec Depomedrol 120  mg today  Plan A = Automatic = stop trelegy for now and start symbicort 160 x 2 puffs then spiriva 2 pffs and 12 hours later just the symbicort 160 x 2 pffs Work on inhaler technique:  Plan B = Backup Only use your albuterol as a rescue medication Plan C = Crisis - only use your albuterol nebulizer if you first try Plan B and it fails to help > ok to use the nebulizer up to every 4 hours but if start needing it regularly call for immediate appointment Plan D = Deltasone = prednisone 10 mg 2 daily until all better then 1 daily x 3 days and stop  For cough mucinex dm 1200 mg every 12 hours  Try prilosec otc 20mg   Take 30-60 min before first meal of the day and Pepcid ac (famotidine) 20 mg one @  bedtime until cough is completely gone for at least a week without the need for cough suppression Return here when you come back for [place]     06/29/2018 acute  ov/Carla Dawson re: sob/cough c/w aecopd in pt with GOLD III criteria  Chief Complaint  Patient presents with  . Acute Visit    increased SOB, cough with yellow sputum and wheezing for the past 5-6 days. She has started using her neb with albuterol 3 x daily.   did fine in June and most of July 2019 on Symb / spiriva then acutely ill x about a week and did not follow written action plan but started using neb as above with some relief and typically comfortable at rest p neb but doe x across the room assoc the purulent sputum,subj wheezing worse at hs rec Depomedrol 120 mg today  Zpak  Plan A = Automatic = continue  symbicort 160 x 2 puffs then spiriva 2 pffs and 12 hours later just the symbicort 160 x 2 pffs Work on inhaler technique:  relax and gently blow all the way out then take a nice smooth deep breath back in, triggering the inhaler at same time you start breathing in.  Hold for up to 5 seconds if you can. Blow out thru nose. Rinse and gargle with water when done Plan B = Backup Only use your albuterol as a rescue medication  Plan C =  Crisis - only use your albuterol nebulizer if you first try Plan B and it fails to help > ok to use the nebulizer up to every 4 hours but if start needing it regularly call for immediate appointment Plan D = Deltasone = prednisone 10 mg 2 daily until all better then 1 daily x 5 days and stop  For cough mucinex dm 1200 mg every 12 hours with the flutter valve Try prilosec otc 20mg   Take 30-60 min before first meal of the day and Pepcid ac (famotidine) 20 mg one @  bedtime until cough is completely gone for at least a week without the need for cough suppression        11/17/2018  f/u ov/Carla Dawson re: GOLD III  Chief Complaint  Patient presents with  . Follow-up    breathing worse since 11/04/18 when she had procedure done on her leg.  She is using her albuterol inhaler once daily on average and neb 3 x  daily.   Dyspnea:  50 ft and limited by  R leg claudication f/u [place] Cough: white mucus worse in am / thick  Sleeping: 30 degrees SABA use: praoair not working / neb needed and on pred taper though not using the way it was last rec = 2 until better then 1 daily x 5 days and stop   02: none rec Plan A = Automatic = continue  symbicort 160 x 2 puffs then spiriva 2 pffs and 12 hours later just the symbicort 160 x 2 pffs Plan B = Backup Only use your albuterol as a rescue medication Plan C = Crisis - only use your albuterol nebulizer if you first try Plan B and it fails to help > ok to use the nebulizer up to every 4 hours but if start needing it regularly call for immediate appointment Plan D = Deltasone = prednisone 10 mg 2 daily until all better then 1 daily x 5 days and stop  For cough mucinex dm 1200 mg every 12 hours with the flutter valve Try prilosec otc 20mg   Take 30-60 min before first meal of the day and Pepcid ac (famotidine) 20 mg one @  bedtime until cough is completely gone for at least a week without the need for cough suppression Please schedule a follow up office visit in 6   weeks, call sooner if needed with all medications /inhalers/ solutions    11/25/2018 acute extended ov/Carla Dawson re: aecopd  Still on prednisone 20 mg daily  Since 11/18/18 Chief Complaint  Patient presents with  . Acute Visit    Increased SOB, rattling in chest and anxiety since the last visit on 11/17/18. She is using her proair 1-2 x daily and is using her neb 3 x daily.   sleeps at 30 degrees ok until night prior to ov now needing noct saba  No cough / no cp  rec Please remember to go to the lab department   for your tests - we will call you with the results when they are available. Amlopidine 5 mg take one half each am  Keep prior appt - call sooner if needed   - late add recheck cbc with fe studies/ consider change norvasc to cardizem next ov          Date of admission: 12/06/2018             Date of discharge: 12/11/2018    Discharge Diagnoses:   Principal Problem:   COPD exacerbation ([facility]) Active Problems:   Essential hypertension, benign   PAD (peripheral artery disease) ([facility])   Neuropathy due to peripheral vascular disease ([place])   Hyperlipidemia   Anemia of chronic disease   SIRS (systemic inflammatory response syndrome) ([facility])   History of present illness: As per the H and P dictated on admission, "[employer] Parkeris a 85 y.o.femalewith medical history significant ofCOPD. OA, hyperlipidemia,anemia of chronic disease, HTN, PVD/PAD.Here with her daughter at bedside.  Over the past week, the patient has been having worsening shortness of breath. This morning, she woke up and her oxygen saturation dropped to the mid 80s. This worked her up and she can never quite fully settle down. She has been breathing rapidly despite normal saturations at home. Nebulization treatments did not work. She had an episode where she spit up some mucus but still felt her breathing was labored. She is compliant with home Spiriva and Symbicort. She has been using her albuterol  frequently at home over the past  week. The patient is having some cough and slight wheezing. She denies any fevers, recent illnesses, chest pain."  Hospital Course:  Summary of her active problems in the hospital is as following. Acute on chronic hypoxic respiratory failure due to COPD exacerbation -She has been feeling short of breath for a while at home suspect a degree of chronic hypoxia, will likely need home oxygen on discharge  A. fib with RVR. Paroxysmal. Now normal sinus rhythm. -Change Norvasc to Cardizem. Patient was given Cardizem bolus. Currently the patient is in sinus rhythm. RVR event was secondary to respiratory distress as well as nebulizing therapy. Patient has just submitted her Holter monitoring. No CV stripsare saved from event for A. fib. We will continue with aspirin and Plavix. Patient actually has a surgery scheduled later this month. Recommend outpatient follow-up with cardiology.  Hypertension -Continue current regimen, blood pressure is stable  PAD -Continue aspirin and Plavix, no concerning symptoms at this point  Neuropathy due to peripheral vascular disease -Continue gabapentin  Hyperlipidemia -Continue Lipitor  Anemia of chronic disease -Hemoglobin stable, no evidence of bleeding  Oral thrush. Continue nystatin on discharge.  01/04/2019  f/u ov/Carla Dawson re:  Chief Complaint  Patient presents with  . Follow-up    She had COPD exacerbation since the last appt and was admitted to the hospital. Her breathing has improved some but not wuite back to her normal baseline. She is using her xopenex inhaler 3 x per wk on average.   Dyspnea:  MMRC3 = can't walk 100 yards even at a slow pace at a flat grade s stopping due to sob   Cough: better does have flutter, not really using  Sleeping: on side bed flat  SABA use: as above / no neb  02:  3lpm at bedtime / not titrating with ex   No obvious day to day or daytime variability or assoc  excess/ purulent sputum or mucus plugs or hemoptysis or cp or chest tightness, subjective wheeze or overt sinus or hb symptoms.   Sleeping now as above  without nocturnal  or early am exacerbation  of respiratory  c/o's or need for noct saba. Also denies any obvious fluctuation of symptoms with weather or environmental changes or other aggravating or alleviating factors except as outlined above   No unusual exposure hx or h/o childhood pna/ asthma or knowledge of premature birth.  Current Allergies, Complete Past Medical History, Past Surgical History, Family History, and Social History were reviewed in [employer] record.  ROS  The following are not active complaints unless bolded Hoarseness, sore throat, dysphagia, dental problems, itching, sneezing,  nasal congestion or discharge of excess mucus or purulent secretions, ear ache,   fever, chills, sweats, unintended wt loss or wt gain, classically pleuritic or exertional cp,  orthopnea pnd or arm/hand swelling  or leg swelling, presyncope, palpitations, abdominal pain, anorexia, nausea, vomiting, diarrhea  or change in bowel habits or change in bladder habits, change in stools or change in urine, dysuria, hematuria,  rash, arthralgias, visual complaints, headache, numbness, weakness or ataxia or problems with walking or coordination,  change in mood or  memory.        Current Meds  Medication Sig  . aspirin EC 81 MG tablet Take 81 mg by mouth daily.  Marland Kitchen atorvastatin (LIPITOR) 10 MG tablet Take 1 tablet (10 mg total) by mouth daily at 6 PM.  . budesonide-formoterol (SYMBICORT) 160-4.5 MCG/ACT inhaler Inhale 2 puffs into the lungs 2 (two) times  daily.  . cetirizine (ZYRTEC) 10 MG tablet Take 10 mg by mouth at bedtime.   . clopidogrel (PLAVIX) 75 MG tablet Take 75 mg by mouth at bedtime.   Marland Kitchen dextromethorphan (DELSYM) 30 MG/5ML liquid Take 30 mg by mouth 2 (two) times daily as needed for cough.  . dextromethorphan-guaiFENesin  (MUCINEX DM) 30-600 MG 12hr tablet Take 0.5 tablets by mouth 2 (two) times daily.  Marland Kitchen diltiazem (CARDIZEM CD) 180 MG 24 hr capsule Take 1 capsule (180 mg total) by mouth daily.  . famotidine (PEPCID) 20 MG tablet Take 20 mg by mouth at bedtime.  . gabapentin (NEURONTIN) 100 MG capsule Take 1 capsule (100 mg total) by mouth 3 (three) times daily. (Patient taking differently: Take 100 mg by mouth 3 (three) times daily as needed (for legs pain). )  . levalbuterol (XOPENEX HFA) 45 MCG/ACT inhaler Inhale 1 puff into the lungs every 4 (four) hours as needed for wheezing. For COPD ICD 10: J44. 9 For A fib ICD: 10 I48. 9  . Multiple Vitamin (MULTIVITAMIN WITH MINERALS) TABS tablet Take 1 tablet by mouth at bedtime.   Marland Kitchen omeprazole (PRILOSEC) 20 MG capsule Take 20 mg by mouth daily.  . sodium chloride (OCEAN) 0.65 % SOLN nasal spray Place 1 spray into both nostrils as needed for congestion.  Marland Kitchen SPIRIVA RESPIMAT 2.5 MCG/ACT AERS INHALE 2 PUFFS INTO THE LUNGS DAILY (Patient taking differently: Inhale 2 puffs into the lungs daily. )             Objective:   Physical Exam  amb wf nad   01/04/2019      107  11/25/2018   112  11/17/2018    115  07/29/2018      112  06/29/2018      113  04/03/2018       110  03/23/2018       111  02/04/2018       109  10/14/2017       108  04/03/2017       100   02/27/2017      96  10/25/16 92 lb 9.6 oz (42 kg)  10/15/16 93 lb 9 oz (42.4 kg)  10/14/16 93 lb (42.2 kg)     Vital signs reviewed - Note on arrival 02 sats  92% on RA        01/04/2019  HEENT: Full dentures, nl oropharynx. Nl external ear canals without cough reflex -  Mild bilateral non-specific turbinate edema   LUNGS: no acc muscle use,  Mild barrel  contour chest wall with bilateral  Distant bs s audible wheeze and  without cough on insp or exp maneuver and mild  Hyperresonant  to  percussion bilaterally    pos late  insp Hoover's  in the supine position. No bruits or organomegaly appreciated, bowel sounds  nl                    Assessment & Plan:

## 2019-01-04 NOTE — Patient Instructions (Addendum)
02 is 3lpm at bedtime and adjust with activity to keep it above 90%    Plan A = Automatic = continue  symbicort 160 x 2 puffs then spiriva 2 pffs and 12 hours later just the symbicort 160 x 2 pffs  Work on inhaler technique:  relax and gently blow all the way out then take a nice smooth deep breath back in, triggering the inhaler at same time you start breathing in.  Hold for up to 5 seconds if you can. Blow out thru nose. Rinse and gargle with water when done       Plan B = Backup Only use your levoalbuterol (xopenex) as a rescue medication to be used if you can't catch your breath by resting or doing a relaxed purse lip breathing pattern.  - The less you use it, the better it will work when you need it. - Ok to use the inhaler up to 2 puffs  every 4 hours if you must but call for appointment if use goes up over your usual need - Don't leave home without it !!  (think of it like the spare tire for your car)   Plan C = Crisis - only use your albuterol nebulizer if you first try Plan B and it fails to help > ok to use the nebulizer up to every 4 hours but if start needing it regularly call for immediate appointment   Plan D = Deltasone = prednisone 10 mg 2 daily until all better then 1 daily x 3 days and stop    For cough mucinex dm 1200 mg every 12 hours with the flutter valve  Try prilosec otc 20mg   Take 30-60 min before first meal of the day and Pepcid ac (famotidine) 20 mg one @  bedtime until cough is completely gone for at least a week without the need for cough suppression   Please remember to go to the lab department   for your tests - we will call you with the results when they are available.       Please schedule a follow up office visit in 4 weeks, call sooner if needed with all medications /inhalers/ solutions in hand so we can verify exactly what you are taking. This includes all medications from all doctors and over the [place] separate them into two bags:  the  ones you take automatically, no matter what, vs the ones you take just when you feel you need them "BAG #2 is UP TO YOU"  - this will really help [place] help you take your medications more effectively.

## 2019-01-05 ENCOUNTER — Telehealth: Payer: Self-pay | Admitting: Internal Medicine

## 2019-01-05 ENCOUNTER — Telehealth: Payer: Self-pay | Admitting: *Deleted

## 2019-01-05 ENCOUNTER — Encounter: Payer: Self-pay | Admitting: Internal Medicine

## 2019-01-05 DIAGNOSIS — J9611 Chronic respiratory failure with hypoxia: Secondary | ICD-10-CM | POA: Insufficient documentation

## 2019-01-05 NOTE — Telephone Encounter (Signed)
Called and spoke with Carla Dawson, anesthesia MC. He stated that the Patient is scheduled for surgery 01/06/19 at [facility].  He was looking over her chart and saw where she was seen by Dr Melvyn Novas 01/04/19, and the note has no mention of upcoming surgery. He was just checking to make sure Dr Melvyn Novas is aware and has no anesthesia warnings.   Dr Melvyn Novas please advise

## 2019-01-05 NOTE — Telephone Encounter (Signed)
States OV from yesterday is not signed and nothing was mentioned about patient's surgery.  Wanting to make sure surgery is still okay for tomorrow.

## 2019-01-05 NOTE — Telephone Encounter (Signed)
Copied from [place] [phone]. Topic: Quick Communication - See Telephone Encounter >> Jan 05, 2019 10:18 AM Vernona Rieger wrote: CRM for notification. See Telephone encounter for: 01/05/19.  Estill Bamberg, RN with [facility] called and wanted to let Dr Martinique know that the patient does not want to have a visit until Friday 1/31 because she is having surgery on her legs Thursday this week.

## 2019-01-05 NOTE — Telephone Encounter (Signed)
FYI sent to Dr. Jordan for review. 

## 2019-01-05 NOTE — Progress Notes (Signed)
Subjective:   Patient ID: Carla Dawson, female    DOB: 10-30-1931,    MRN: [id number]     Brief patient profile:  85 yowf  Quit smoking 2007 with dx of copd in 2002 while living in [place] rx with Advair then Breo since quit smoking referred to pulmonary clinic 10/25/2016 by Dr   Betty Martinique   History of Present Illness  10/25/2016 1st [facility] office visit/ Carla Dawson   Chief Complaint  Patient presents with  . Pulmonary Consult    Referred by Dr. Betty Martinique.  Pt here to est care for COPD. She c/o SOB with walking approx 800 ft on a flat surface, eating and walking up an incline. She has some PND but not coughing much.    MMRC2 = can't walk a nl pace on a flat grade s sob but does fine slow and flat eg walking up to 800 ft - she measures the distances herself to keep tack as also limited by claudication - rarely feels need for saba  rec Work on inhaler technique:  relax and gently blow all the way out then take a nice smooth deep breath back in, triggering the inhaler at same time you start breathing in.  Hold for up to 5 seconds if you can. Blow out thru nose. Rinse and gargle with water when done Plan A = Automatic = Symbicort 160 Take 2 puffs first thing in am and then another 2 puffs about 12 hours later.  Plan B = Backup Only use your albuterol (proair) as a rescue medication Plan C = Crisis - only use your albuterol nebulizer if you first try Plan B and it fails to help > ok to use the nebulizer up to every 4 hours but if start needing it regularly call for immediate appointment   02/27/2017  f/u ov/Carla Dawson re:  Copd / 02 prn maint on advair 250 bid and prn saba  Chief Complaint  Patient presents with  . Follow-up    Pt states was hospitalized while in [place] early March 2018. She states she was told that she may have had the beginning of PNA. She states today her breathing is "great". She has occ non prod cough. She was sent home with o2, but has  not used in the past several days.   breathing worse suddenly while in [place] not responsive  To albuterol or 2lpm > admit x 4 days and back to baseline  MMRC2 = can't walk a nl pace on a flat grade s sob but does fine slow and flat  rec Plan A = Automatic = Breo one click each am > take two good deep drags off it but click it just once  Plan B = Backup Only use your albuterol (PROAIR) as a rescue medication Plan C = Crisis - only use your albuterol nebulizer if you first try Plan B       02/04/2018  f/u ov/Carla Dawson re:  COPD III /one flare in Dec 2018 / BREO maint but req one course pred in last 3 m for aecopd  Chief Complaint  Patient presents with  . Follow-up    Last night she was eating and aspirated hamburger. She states that she feels pain in her throat when she takes a deep breath.  Her breathing is unchanged. She is using her proair 3-4 x per wk on average and rarely uses neb.   Dyspnea:  MMRC3 = can't walk 100 yards even  at a slow pace at a flat grade s stopping due to sob   Cough: not normally/  Sleep: ok/ no 02  SABA use:   No more than once a day/ maybe p shower  rec Plan A = Automatic = Breo or Trelegy daily  Work on inhaler technique:   Plan B = Backup Only use your albuterol (Proair)  as a rescue medication  Plan C = Crisis - only use your albuterol nebulizer if you first try Plan B Plan D = Deltasone take 6 days if you are having to use plan c and not working great Plan E  = ER, go there if all elso fails         11/25/2018 acute extended ov/Carla Dawson re: aecopd  Still on prednisone 20 mg daily  Since 11/18/18 Chief Complaint  Patient presents with  . Acute Visit    Increased SOB, rattling in chest and anxiety since the last visit on 11/17/18. She is using her proair 1-2 x daily and is using her neb 3 x daily.   sleeps at 30 degrees ok until night prior to ov now needing noct saba  No cough / no cp  rec Please remember to go to the lab department   for your tests  - we will call you with the results when they are available. Amlopidine 5 mg take one half each am  Keep prior appt - call sooner if needed   - late add recheck cbc with fe studies/ consider change norvasc to cardizem next ov          Date of admission: 12/06/2018             Date of discharge: 12/11/2018    Discharge Diagnoses:   Principal Problem:   COPD exacerbation ([facility]) Active Problems:   Essential hypertension, benign   PAD (peripheral artery disease) ([facility])   Neuropathy due to peripheral vascular disease ([place])   Hyperlipidemia   Anemia of chronic disease   SIRS (systemic inflammatory response syndrome) ([place])   History of present illness: As per the H and P dictated on admission, "[employer] Parkeris a 85 y.o.femalewith medical history significant ofCOPD. OA, hyperlipidemia,anemia of chronic disease, HTN, PVD/PAD.Here with her daughter at bedside.  Over the past week, the patient has been having worsening shortness of breath. This morning, she woke up and her oxygen saturation dropped to the mid 80s. This worked her up and she can never quite fully settle down. She has been breathing rapidly despite normal saturations at home. Nebulization treatments did not work. She had an episode where she spit up some mucus but still felt her breathing was labored. She is compliant with home Spiriva and Symbicort. She has been using her albuterol frequently at home over the past week. The patient is having some cough and slight wheezing. She denies any fevers, recent illnesses, chest pain."  Hospital Course:  Summary of her active problems in the hospital is as following. Acute on chronic hypoxic respiratory failure due to COPD exacerbation -She has been feeling short of breath for a while at home suspect a degree of chronic hypoxia, will likely need home oxygen on discharge  A. fib with RVR. Paroxysmal. Now normal sinus rhythm. -Change Norvasc to Cardizem.  Patient was given Cardizem bolus. Currently the patient is in sinus rhythm. RVR event was secondary to respiratory distress as well as nebulizing therapy. Patient has just submitted her Holter monitoring. No CV stripsare saved from event for A.  fib. We will continue with aspirin and Plavix. Patient actually has a surgery scheduled later this month. Recommend outpatient follow-up with cardiology.  Hypertension -Continue current regimen, blood pressure is stable  PAD -Continue aspirin and Plavix, no concerning symptoms at this point  Neuropathy due to peripheral vascular disease -Continue gabapentin  Hyperlipidemia -Continue Lipitor  Anemia of chronic disease -Hemoglobin stable, no evidence of bleeding  Oral thrush. Continue nystatin on discharge.  01/04/2019  f/u ov/Carla Dawson re: GOLD III criteria/ 02 hs and prn daytime/ planning Fem art surgery this week/ no prednisone since prior flare  Chief Complaint  Patient presents with  . Follow-up    She had COPD exacerbation since the last appt and was admitted to the hospital. Her breathing has improved some but not wuite back to her normal baseline. She is using her xopenex inhaler 3 x per wk on average.   Dyspnea:  MMRC3 = can't walk 100 yards even at a slow pace at a flat grade s stopping due to sob   Cough: better does have flutter, not really using  Sleeping: on side bed flat  SABA use: as above / no neb  02:  3lpm at bedtime / not titrating with ex   No obvious day to day or daytime variability or assoc excess/ purulent sputum or mucus plugs or hemoptysis or cp or chest tightness, subjective wheeze or overt sinus or hb symptoms.   Sleeping  without nocturnal  or early am exacerbation  of respiratory  c/o's or need for noct saba. Also denies any obvious fluctuation of symptoms with weather or environmental changes or other aggravating or alleviating factors except as outlined above   No unusual exposure hx or h/o childhood  pna/ asthma or knowledge of premature birth.  Current Allergies, Complete Past Medical History, Past Surgical History, Family History, and Social History were reviewed in [employer] record.  ROS  The following are not active complaints unless bolded Hoarseness, sore throat, dysphagia, dental problems, itching, sneezing,  nasal congestion or discharge of excess mucus or purulent secretions, ear ache,   fever, chills, sweats, unintended wt loss or wt gain, classically pleuritic or exertional cp,  orthopnea pnd or arm/hand swelling  or leg swelling, presyncope, palpitations, abdominal pain, anorexia, nausea, vomiting, diarrhea  or change in bowel habits or change in bladder habits, change in stools or change in urine, dysuria, hematuria,  rash, arthralgias, visual complaints, headache, numbness, weakness or ataxia or problems with walking or coordination,  change in mood or  memory.        Current Meds  Medication Sig  . aspirin EC 81 MG tablet Take 81 mg by mouth daily.  Marland Kitchen atorvastatin (LIPITOR) 10 MG tablet Take 1 tablet (10 mg total) by mouth daily at 6 PM.  . budesonide-formoterol (SYMBICORT) 160-4.5 MCG/ACT inhaler Inhale 2 puffs into the lungs 2 (two) times daily.  . cetirizine (ZYRTEC) 10 MG tablet Take 10 mg by mouth at bedtime.   . clopidogrel (PLAVIX) 75 MG tablet Take 75 mg by mouth at bedtime.   Marland Kitchen dextromethorphan (DELSYM) 30 MG/5ML liquid Take 30 mg by mouth 2 (two) times daily as needed for cough.  . dextromethorphan-guaiFENesin (MUCINEX DM) 30-600 MG 12hr tablet Take 0.5 tablets by mouth 2 (two) times daily.  Marland Kitchen diltiazem (CARDIZEM CD) 180 MG 24 hr capsule Take 1 capsule (180 mg total) by mouth daily.  . famotidine (PEPCID) 20 MG tablet Take 20 mg by mouth at bedtime.  . gabapentin (  NEURONTIN) 100 MG capsule Take 1 capsule (100 mg total) by mouth 3 (three) times daily. (Patient taking differently: Take 100 mg by mouth 3 (three) times daily as needed (for legs  pain). )  . levalbuterol (XOPENEX HFA) 45 MCG/ACT inhaler Inhale 1 puff into the lungs every 4 (four) hours as needed for wheezing. For COPD ICD 10: J44. 9 For A fib ICD: 10 I48. 9  . Multiple Vitamin (MULTIVITAMIN WITH MINERALS) TABS tablet Take 1 tablet by mouth at bedtime.   Marland Kitchen omeprazole (PRILOSEC) 20 MG capsule Take 20 mg by mouth daily.  . sodium chloride (OCEAN) 0.65 % SOLN nasal spray Place 1 spray into both nostrils as needed for congestion.  Marland Kitchen SPIRIVA RESPIMAT 2.5 MCG/ACT AERS INHALE 2 PUFFS INTO THE LUNGS DAILY (Patient taking differently: Inhale 2 puffs into the lungs daily. )             Objective:   Physical Exam  amb thin wf nad   01/04/2019      107  11/25/2018   112  11/17/2018    115  07/29/2018      112  06/29/2018      113  04/03/2018       110  03/23/2018       111  02/04/2018       109  10/14/2017       108  04/03/2017       100   02/27/2017      96  10/25/16 92 lb 9.6 oz (42 kg)  10/15/16 93 lb 9 oz (42.4 kg)  10/14/16 93 lb (42.2 kg)     Vital signs reviewed - Note on arrival 02 sats  92% on RA       HEENT: Full dentures/ nl  oropharynx. Nl external ear canals without cough reflex -  Mild bilateral non-specific turbinate edema     NECK :  without JVD/Nodes/TM/ nl carotid upstrokes bilaterally   LUNGS: no acc muscle use,  Mod barrel  contour chest wall with bilateral  Distant bs s audible wheeze and  without cough on insp or exp maneuver and mod   Hyperresonant  to  percussion bilaterally     CV:  RRR  no s3 or murmur or increase in P2, and no edema   ABD:  soft and nontender with pos mid  insp Hoover's  in the supine position. No bruits or organomegaly appreciated, bowel sounds nl  MS:   Nl gait/  ext warm without deformities, calf tenderness, cyanosis or clubbing No obvious joint restrictions   SKIN: warm and dry without lesions    NEURO:  alert, approp, nl sensorium with  no motor or cerebellar deficits apparent.         Labs ordered 01/04/2019   [facility] AT screen     I personally reviewed images and agree with radiology impression as follows:  CXR:   12/06/18 1. No radiographic evidence of acute cardiopulmonary disease. 2. Aortic atherosclerosis. 3. Emphysema            Assessment & Plan:

## 2019-01-05 NOTE — Telephone Encounter (Signed)
Will send to Nurse as FYI  Pt to have visits starting after her sx on Thursday 01/07/2019 -- [facility] to now visit on 01/08/2019 Nothing further needed.

## 2019-01-05 NOTE — Assessment & Plan Note (Signed)
Quit smoking 2007  10/25/2016  try symbicort 160 2bid instead  of breo > preferred breo - 02/27/2017  After extensive coaching HFA effectiveness =    25% with hfa/ 75% with dpi so try back to BREO - PFT's  04/03/2017  FEV1 0.59 (37 % ) ratio 34  p 12  % improvement from saba p BREO  prior to study with DLCO  35/35 % corrects to 49  % for alv volume   - 10/14/2017    > try dulera 100 2bid due to hoarseness on breo   - 02/04/18 >  Added prednisone as Plan D - 02/04/2018 trial of trelegy>  Refractory aecopd 04/03/2018 > changed to symb 160/ spiriva 2.5 and pred 20 until better than taper off as Plan D    01/04/2019  After extensive coaching inhaler device,  effectiveness =    75%     Group D in terms of symptom/risk and laba/lama/ICS  therefore appropriate rx at this point> continue symb/spiriva with pred as backup

## 2019-01-05 NOTE — Anesthesia Preprocedure Evaluation (Addendum)
Anesthesia Evaluation  Patient identified by MRN, date of birth, ID band Patient awake    Reviewed: Allergy & Precautions, NPO status , Patient's Chart, lab work & pertinent test results  History of Anesthesia Complications (+) Emergence Delirium  Airway Mallampati: II  TM Distance: >3 FB     Dental   Pulmonary former smoker,    breath sounds clear to auscultation       Cardiovascular hypertension, + Peripheral Vascular Disease and + DOE  + dysrhythmias  Rhythm:Regular Rate:Normal     Neuro/Psych    GI/Hepatic Neg liver ROS, GERD  ,  Endo/Other  negative endocrine ROS  Renal/GU negative Renal ROS     Musculoskeletal  (+) Arthritis ,   Abdominal   Peds  Hematology  (+) anemia ,   Anesthesia Other Findings   Reproductive/Obstetrics                         Anesthesia Physical Anesthesia Plan  ASA: IV  Anesthesia Plan: General   Post-op Pain Management:    Induction: Intravenous  PONV Risk Score and Plan: Ondansetron, Dexamethasone, Midazolam and Treatment may vary due to age or medical condition  Airway Management Planned: Oral ETT  Additional Equipment: Arterial line  Intra-op Plan:   Post-operative Plan: Possible Post-op intubation/ventilation  Informed Consent: I have reviewed the patients History and Physical, chart, labs and discussed the procedure including the risks, benefits and alternatives for the proposed anesthesia with the patient or authorized representative who has indicated his/her understanding and acceptance.     Dental advisory given  Plan Discussed with: Anesthesiologist  Anesthesia Plan Comments: (See PAT note by Karoline Caldwell, PA-C )      Anesthesia Quick Evaluation

## 2019-01-05 NOTE — Assessment & Plan Note (Addendum)
Chronic noct 02 dep @ 3lpm/ prn daytime to keep sats > 90%  - HCO3  26 (prior = 34) c/w improved hypercarbia    Adequate control/ well compensated  on present rx, reviewed in detail with pt > no change in rx needed     She is at increased risk of respiratory failure with   fem artery surgery but it is not prohibitive and note no active AB component or hypercarbia at this point.  May need bipap bridge and pulmonary f/u prn periop but no  opportunity for further "tune up" at this point but may require periop Hydrocortisone per anesthesia protocol     I had an extended discussion with the patient and daughter reviewing all relevant studies completed to date and  lasting 15 to 20 minutes of a 25 minute visit    See device teaching which extended face to face time for this visit.  Each maintenance medication was reviewed in detail including emphasizing most importantly the difference between maintenance and prns and under what circumstances the prns are to be triggered using an action plan format that is not reflected in the computer generated alphabetically organized AVS which I have not found useful in most complex patients, especially with respiratory illnesses  Please see AVS for specific instructions unique to this visit that I personally wrote and verbalized to the the pt in detail and then reviewed with pt  by my nurse highlighting any  changes in therapy recommended at today's visit to their plan of care.

## 2019-01-06 ENCOUNTER — Inpatient Hospital Stay (HOSPITAL_COMMUNITY): Payer: Medicare Other | Admitting: Certified Registered"

## 2019-01-06 ENCOUNTER — Inpatient Hospital Stay (HOSPITAL_COMMUNITY): Payer: Medicare Other

## 2019-01-06 ENCOUNTER — Inpatient Hospital Stay (HOSPITAL_COMMUNITY): Payer: Medicare Other | Admitting: Physician Assistant

## 2019-01-06 ENCOUNTER — Encounter (HOSPITAL_COMMUNITY)
Admission: RE | Disposition: A | Discharge status: Skilled Nursing Facility | Payer: Self-pay | Source: Home / Self Care | Attending: Surgery

## 2019-01-06 ENCOUNTER — Other Ambulatory Visit: Payer: Self-pay

## 2019-01-06 ENCOUNTER — Inpatient Hospital Stay (HOSPITAL_COMMUNITY)
Admission: RE | Admit: 2019-01-06 | Discharge: 2019-01-09 | DRG: 271 | Disposition: A | Discharge status: Skilled Nursing Facility | Payer: Medicare Other | Attending: Surgery | Admitting: Surgery

## 2019-01-06 ENCOUNTER — Encounter (HOSPITAL_COMMUNITY): Payer: Self-pay | Admitting: Certified Registered"

## 2019-01-06 DIAGNOSIS — Z8249 Family history of ischemic heart disease and other diseases of the circulatory system: Secondary | ICD-10-CM | POA: Diagnosis not present

## 2019-01-06 DIAGNOSIS — T82898A Other specified complication of vascular prosthetic devices, implants and grafts, initial encounter: Principal | ICD-10-CM | POA: Diagnosis present

## 2019-01-06 DIAGNOSIS — R6510 Systemic inflammatory response syndrome (SIRS) of non-infectious origin without acute organ dysfunction: Secondary | ICD-10-CM | POA: Diagnosis not present

## 2019-01-06 DIAGNOSIS — Z79891 Long term (current) use of opiate analgesic: Secondary | ICD-10-CM

## 2019-01-06 DIAGNOSIS — Z79899 Other long term (current) drug therapy: Secondary | ICD-10-CM

## 2019-01-06 DIAGNOSIS — I708 Atherosclerosis of other arteries: Secondary | ICD-10-CM | POA: Diagnosis present

## 2019-01-06 DIAGNOSIS — Z8261 Family history of arthritis: Secondary | ICD-10-CM | POA: Diagnosis not present

## 2019-01-06 DIAGNOSIS — Z7902 Long term (current) use of antithrombotics/antiplatelets: Secondary | ICD-10-CM | POA: Diagnosis not present

## 2019-01-06 DIAGNOSIS — I70211 Atherosclerosis of native arteries of extremities with intermittent claudication, right leg: Secondary | ICD-10-CM | POA: Diagnosis not present

## 2019-01-06 DIAGNOSIS — G63 Polyneuropathy in diseases classified elsewhere: Secondary | ICD-10-CM | POA: Diagnosis not present

## 2019-01-06 DIAGNOSIS — D500 Iron deficiency anemia secondary to blood loss (chronic): Secondary | ICD-10-CM | POA: Diagnosis present

## 2019-01-06 DIAGNOSIS — Z87891 Personal history of nicotine dependence: Secondary | ICD-10-CM

## 2019-01-06 DIAGNOSIS — I739 Peripheral vascular disease, unspecified: Secondary | ICD-10-CM | POA: Diagnosis not present

## 2019-01-06 DIAGNOSIS — Z7982 Long term (current) use of aspirin: Secondary | ICD-10-CM | POA: Diagnosis not present

## 2019-01-06 DIAGNOSIS — J189 Pneumonia, unspecified organism: Secondary | ICD-10-CM | POA: Diagnosis not present

## 2019-01-06 DIAGNOSIS — M6281 Muscle weakness (generalized): Secondary | ICD-10-CM | POA: Diagnosis not present

## 2019-01-06 DIAGNOSIS — J9611 Chronic respiratory failure with hypoxia: Secondary | ICD-10-CM | POA: Diagnosis not present

## 2019-01-06 DIAGNOSIS — J449 Chronic obstructive pulmonary disease, unspecified: Secondary | ICD-10-CM | POA: Diagnosis present

## 2019-01-06 DIAGNOSIS — Z419 Encounter for procedure for purposes other than remedying health state, unspecified: Secondary | ICD-10-CM

## 2019-01-06 DIAGNOSIS — Z885 Allergy status to narcotic agent status: Secondary | ICD-10-CM

## 2019-01-06 DIAGNOSIS — I10 Essential (primary) hypertension: Secondary | ICD-10-CM | POA: Diagnosis present

## 2019-01-06 DIAGNOSIS — Z9889 Other specified postprocedural states: Secondary | ICD-10-CM | POA: Diagnosis not present

## 2019-01-06 DIAGNOSIS — I745 Embolism and thrombosis of iliac artery: Secondary | ICD-10-CM | POA: Diagnosis present

## 2019-01-06 DIAGNOSIS — Z7952 Long term (current) use of systemic steroids: Secondary | ICD-10-CM | POA: Diagnosis not present

## 2019-01-06 DIAGNOSIS — Y832 Surgical operation with anastomosis, bypass or graft as the cause of abnormal reaction of the patient, or of later complication, without mention of misadventure at the time of the procedure: Secondary | ICD-10-CM | POA: Diagnosis present

## 2019-01-06 DIAGNOSIS — R2689 Other abnormalities of gait and mobility: Secondary | ICD-10-CM | POA: Diagnosis not present

## 2019-01-06 DIAGNOSIS — Z862 Personal history of diseases of the blood and blood-forming organs and certain disorders involving the immune mechanism: Secondary | ICD-10-CM | POA: Diagnosis not present

## 2019-01-06 DIAGNOSIS — Z95828 Presence of other vascular implants and grafts: Secondary | ICD-10-CM | POA: Diagnosis not present

## 2019-01-06 DIAGNOSIS — D649 Anemia, unspecified: Secondary | ICD-10-CM | POA: Diagnosis not present

## 2019-01-06 DIAGNOSIS — E785 Hyperlipidemia, unspecified: Secondary | ICD-10-CM | POA: Diagnosis not present

## 2019-01-06 DIAGNOSIS — I743 Embolism and thrombosis of arteries of the lower extremities: Secondary | ICD-10-CM | POA: Diagnosis not present

## 2019-01-06 DIAGNOSIS — K219 Gastro-esophageal reflux disease without esophagitis: Secondary | ICD-10-CM | POA: Diagnosis present

## 2019-01-06 DIAGNOSIS — T82868A Thrombosis of vascular prosthetic devices, implants and grafts, initial encounter: Secondary | ICD-10-CM | POA: Diagnosis not present

## 2019-01-06 DIAGNOSIS — M1380 Other specified arthritis, unspecified site: Secondary | ICD-10-CM | POA: Diagnosis not present

## 2019-01-06 DIAGNOSIS — D539 Nutritional anemia, unspecified: Secondary | ICD-10-CM | POA: Diagnosis present

## 2019-01-06 DIAGNOSIS — I480 Paroxysmal atrial fibrillation: Secondary | ICD-10-CM | POA: Diagnosis not present

## 2019-01-06 HISTORY — PX: LOWER EXTREMITY ANGIOGRAM: SHX5508

## 2019-01-06 HISTORY — PX: ENDARTERECTOMY POPLITEAL: SHX5806

## 2019-01-06 HISTORY — PX: THROMBECTOMY FEMORAL ARTERY: SHX6406

## 2019-01-06 HISTORY — PX: PATCH ANGIOPLASTY: SHX6230

## 2019-01-06 HISTORY — PX: APPLICATION OF WOUND VAC: SHX5189

## 2019-01-06 HISTORY — PX: INSERTION OF ILIAC STENT: SHX6256

## 2019-01-06 HISTORY — PX: ENDARTERECTOMY FEMORAL: SHX5804

## 2019-01-06 LAB — POCT I-STAT 7, (LYTES, BLD GAS, ICA,H+H)
Acid-Base Excess: 3 mmol/L — ABNORMAL HIGH (ref 0.0–2.0)
Bicarbonate: 28.6 mmol/L — ABNORMAL HIGH (ref 20.0–28.0)
Calcium, Ion: 1.24 mmol/L (ref 1.15–1.40)
HCT: 23 % — ABNORMAL LOW (ref 36.0–46.0)
Hemoglobin: 7.8 g/dL — ABNORMAL LOW (ref 12.0–15.0)
O2 Saturation: 100 %
Patient temperature: 37.4
Potassium: 4 mmol/L (ref 3.5–5.1)
Sodium: 139 mmol/L (ref 135–145)
TCO2: 30 mmol/L (ref 22–32)
pCO2 arterial: 48.5 mmHg — ABNORMAL HIGH (ref 32.0–48.0)
pH, Arterial: 7.38 (ref 7.350–7.450)
pO2, Arterial: 255 mmHg — ABNORMAL HIGH (ref 83.0–108.0)

## 2019-01-06 LAB — POCT ACTIVATED CLOTTING TIME
ACTIVATED CLOTTING TIME: 208 s
Activated Clotting Time: 153 seconds
Activated Clotting Time: 235 seconds
Activated Clotting Time: 235 seconds
Activated Clotting Time: 279 seconds

## 2019-01-06 LAB — PREPARE RBC (CROSSMATCH)

## 2019-01-06 SURGERY — THROMBECTOMY, ARTERY, FEMORAL
Anesthesia: General | Site: Leg Lower | Laterality: Left

## 2019-01-06 SURGERY — ENDARTERECTOMY, FEMORAL
Anesthesia: General | Site: Leg Lower | Laterality: Left

## 2019-01-06 MED ORDER — FENTANYL CITRATE (PF) 100 MCG/2ML IJ SOLN: 25.0000 ug | INTRAMUSCULAR | Status: DC | PRN

## 2019-01-06 MED ORDER — POTASSIUM CHLORIDE CRYS ER 20 MEQ PO TBCR: 20.0000 meq | EXTENDED_RELEASE_TABLET | Freq: Every day | ORAL | Status: DC | PRN

## 2019-01-06 MED ORDER — ALBUMIN HUMAN 5 % IV SOLN
INTRAVENOUS | Status: DC | PRN
  Administered 2019-01-06: 11:00:00 via INTRAVENOUS

## 2019-01-06 MED ORDER — SODIUM CHLORIDE 0.9 % IV SOLN: 500.0000 mL | Freq: Once | INTRAVENOUS | Status: DC | PRN

## 2019-01-06 MED ORDER — LIDOCAINE 2% (20 MG/ML) 5 ML SYRINGE
INTRAMUSCULAR | Status: AC
  Filled 2019-01-06: qty 5

## 2019-01-06 MED ORDER — CEFAZOLIN SODIUM-DEXTROSE 2-4 GM/100ML-% IV SOLN
INTRAVENOUS | Status: AC
  Filled 2019-01-06: qty 100

## 2019-01-06 MED ORDER — CEFAZOLIN SODIUM-DEXTROSE 2-3 GM-%(50ML) IV SOLR
INTRAVENOUS | Status: DC | PRN
  Administered 2019-01-06: 2 g via INTRAVENOUS

## 2019-01-06 MED ORDER — LIDOCAINE 2% (20 MG/ML) 5 ML SYRINGE
INTRAMUSCULAR | Status: DC | PRN
  Administered 2019-01-06: 100 mg via INTRAVENOUS

## 2019-01-06 MED ORDER — ROCURONIUM BROMIDE 50 MG/5ML IV SOSY
PREFILLED_SYRINGE | INTRAVENOUS | Status: AC
  Filled 2019-01-06: qty 5

## 2019-01-06 MED ORDER — SODIUM CHLORIDE 0.9 % IV SOLN
INTRAVENOUS | Status: AC
  Filled 2019-01-06: qty 1.2

## 2019-01-06 MED ORDER — MIDAZOLAM HCL 2 MG/2ML IJ SOLN
INTRAMUSCULAR | Status: AC
  Filled 2019-01-06: qty 2

## 2019-01-06 MED ORDER — ADULT MULTIVITAMIN W/MINERALS CH
1.0000 | ORAL_TABLET | Freq: Every day | ORAL | Status: DC
  Administered 2019-01-07 – 2019-01-08 (×2): 1 via ORAL
  Filled 2019-01-06 (×3): qty 1

## 2019-01-06 MED ORDER — PHENYLEPHRINE 40 MCG/ML (10ML) SYRINGE FOR IV PUSH (FOR BLOOD PRESSURE SUPPORT)
PREFILLED_SYRINGE | INTRAVENOUS | Status: DC | PRN
  Administered 2019-01-06: 40 ug via INTRAVENOUS
  Administered 2019-01-06 (×2): 80 ug via INTRAVENOUS

## 2019-01-06 MED ORDER — SUGAMMADEX SODIUM 200 MG/2ML IV SOLN
INTRAVENOUS | Status: DC | PRN
  Administered 2019-01-06: 100 mg via INTRAVENOUS

## 2019-01-06 MED ORDER — UMECLIDINIUM BROMIDE 62.5 MCG/INH IN AEPB
1.0000 | INHALATION_SPRAY | Freq: Every day | RESPIRATORY_TRACT | Status: DC
  Administered 2019-01-07 – 2019-01-09 (×3): 1 via RESPIRATORY_TRACT
  Filled 2019-01-06: qty 7

## 2019-01-06 MED ORDER — ONDANSETRON HCL 4 MG/2ML IJ SOLN: 4.0000 mg | Freq: Four times a day (QID) | INTRAMUSCULAR | Status: DC | PRN

## 2019-01-06 MED ORDER — PROPOFOL 10 MG/ML IV BOLUS
INTRAVENOUS | Status: AC
  Filled 2019-01-06: qty 20

## 2019-01-06 MED ORDER — DEXAMETHASONE SODIUM PHOSPHATE 10 MG/ML IJ SOLN
INTRAMUSCULAR | Status: AC
  Filled 2019-01-06: qty 1

## 2019-01-06 MED ORDER — SODIUM CHLORIDE 0.9% IV SOLUTION: Freq: Once | INTRAVENOUS | Status: DC

## 2019-01-06 MED ORDER — ONDANSETRON HCL 4 MG/2ML IJ SOLN
INTRAMUSCULAR | Status: AC
  Filled 2019-01-06: qty 2

## 2019-01-06 MED ORDER — PANTOPRAZOLE SODIUM 40 MG PO TBEC: 40.0000 mg | DELAYED_RELEASE_TABLET | Freq: Every day | ORAL | Status: DC

## 2019-01-06 MED ORDER — 0.9 % SODIUM CHLORIDE (POUR BTL) OPTIME
TOPICAL | Status: DC | PRN
  Administered 2019-01-06: 3000 mL

## 2019-01-06 MED ORDER — PHENYLEPHRINE 40 MCG/ML (10ML) SYRINGE FOR IV PUSH (FOR BLOOD PRESSURE SUPPORT)
PREFILLED_SYRINGE | INTRAVENOUS | Status: AC
  Filled 2019-01-06: qty 10

## 2019-01-06 MED ORDER — TRAMADOL HCL 50 MG PO TABS
50.0000 mg | ORAL_TABLET | Freq: Four times a day (QID) | ORAL | Status: DC | PRN
  Administered 2019-01-06: 50 mg via ORAL
  Filled 2019-01-06: qty 1

## 2019-01-06 MED ORDER — PHENOL 1.4 % MT LIQD: 1.0000 | OROMUCOSAL | Status: DC | PRN

## 2019-01-06 MED ORDER — ALUM & MAG HYDROXIDE-SIMETH 200-200-20 MG/5ML PO SUSP: 15.0000 mL | ORAL | Status: DC | PRN

## 2019-01-06 MED ORDER — GABAPENTIN 100 MG PO CAPS
100.0000 mg | ORAL_CAPSULE | Freq: Three times a day (TID) | ORAL | Status: DC
  Administered 2019-01-07 – 2019-01-09 (×6): 100 mg via ORAL
  Filled 2019-01-06 (×7): qty 1

## 2019-01-06 MED ORDER — SODIUM CHLORIDE 0.9 % IV SOLN
INTRAVENOUS | Status: DC | PRN
  Administered 2019-01-06: 500 mL

## 2019-01-06 MED ORDER — FENTANYL CITRATE (PF) 250 MCG/5ML IJ SOLN
INTRAMUSCULAR | Status: AC
  Filled 2019-01-06: qty 5

## 2019-01-06 MED ORDER — DEXTROMETHORPHAN POLISTIREX ER 30 MG/5ML PO SUER: 30.0000 mg | Freq: Two times a day (BID) | ORAL | Status: DC | PRN

## 2019-01-06 MED ORDER — FENTANYL CITRATE (PF) 100 MCG/2ML IJ SOLN
25.0000 ug | INTRAMUSCULAR | Status: DC | PRN
  Administered 2019-01-06 (×3): 50 ug via INTRAVENOUS

## 2019-01-06 MED ORDER — SALINE SPRAY 0.65 % NA SOLN
1.0000 | NASAL | Status: DC | PRN
  Filled 2019-01-06: qty 44

## 2019-01-06 MED ORDER — HEPARIN SODIUM (PORCINE) 1000 UNIT/ML IJ SOLN
INTRAMUSCULAR | Status: AC
  Filled 2019-01-06: qty 1

## 2019-01-06 MED ORDER — HEPARIN SODIUM (PORCINE) 1000 UNIT/ML IJ SOLN
INTRAMUSCULAR | Status: DC | PRN
  Administered 2019-01-06: 2000 [IU] via INTRAVENOUS
  Administered 2019-01-06: 1000 [IU] via INTRAVENOUS
  Administered 2019-01-06: 2000 [IU] via INTRAVENOUS
  Administered 2019-01-06: 5000 [IU] via INTRAVENOUS

## 2019-01-06 MED ORDER — OXYCODONE-ACETAMINOPHEN 5-325 MG PO TABS
1.0000 | ORAL_TABLET | ORAL | Status: DC | PRN
  Administered 2019-01-07 – 2019-01-08 (×2): 1 via ORAL
  Filled 2019-01-06 (×2): qty 1

## 2019-01-06 MED ORDER — CHLORHEXIDINE GLUCONATE 4 % EX LIQD: 60.0000 mL | Freq: Once | CUTANEOUS | Status: DC

## 2019-01-06 MED ORDER — DM-GUAIFENESIN ER 30-600 MG PO TB12: 0.5000 | ORAL_TABLET | Freq: Two times a day (BID) | ORAL | Status: DC

## 2019-01-06 MED ORDER — 0.9 % SODIUM CHLORIDE (POUR BTL) OPTIME
TOPICAL | Status: DC | PRN
  Administered 2019-01-06: 2000 mL

## 2019-01-06 MED ORDER — DEXAMETHASONE SODIUM PHOSPHATE 10 MG/ML IJ SOLN
INTRAMUSCULAR | Status: DC | PRN
  Administered 2019-01-06: 6 mg via INTRAVENOUS

## 2019-01-06 MED ORDER — LEVALBUTEROL HCL 0.63 MG/3ML IN NEBU: 0.6300 mg | INHALATION_SOLUTION | Freq: Four times a day (QID) | RESPIRATORY_TRACT | Status: DC | PRN

## 2019-01-06 MED ORDER — LEVALBUTEROL TARTRATE 45 MCG/ACT IN AERO: 1.0000 | INHALATION_SPRAY | RESPIRATORY_TRACT | Status: DC | PRN

## 2019-01-06 MED ORDER — ONDANSETRON HCL 4 MG/2ML IJ SOLN
INTRAMUSCULAR | Status: DC | PRN
  Administered 2019-01-06: 4 mg via INTRAVENOUS

## 2019-01-06 MED ORDER — ROCURONIUM BROMIDE 50 MG/5ML IV SOSY
PREFILLED_SYRINGE | INTRAVENOUS | Status: DC | PRN
  Administered 2019-01-06: 40 mg via INTRAVENOUS

## 2019-01-06 MED ORDER — TIOTROPIUM BROMIDE MONOHYDRATE 2.5 MCG/ACT IN AERS: 2.0000 | INHALATION_SPRAY | Freq: Every day | RESPIRATORY_TRACT | Status: DC

## 2019-01-06 MED ORDER — MIDAZOLAM HCL 2 MG/2ML IJ SOLN: 0.5000 mg | Freq: Once | INTRAMUSCULAR | Status: DC | PRN

## 2019-01-06 MED ORDER — SODIUM CHLORIDE 0.9 % IV SOLN
INTRAVENOUS | Status: DC | PRN
  Administered 2019-01-06: 09:00:00

## 2019-01-06 MED ORDER — LACTATED RINGERS IV SOLN
INTRAVENOUS | Status: DC
  Administered 2019-01-06 (×3): via INTRAVENOUS

## 2019-01-06 MED ORDER — PROTAMINE SULFATE 10 MG/ML IV SOLN
INTRAVENOUS | Status: DC | PRN
  Administered 2019-01-06: 5 mg via INTRAVENOUS
  Administered 2019-01-06: 20 mg via INTRAVENOUS
  Administered 2019-01-06: 25 mg via INTRAVENOUS

## 2019-01-06 MED ORDER — MOMETASONE FURO-FORMOTEROL FUM 200-5 MCG/ACT IN AERO
2.0000 | INHALATION_SPRAY | Freq: Two times a day (BID) | RESPIRATORY_TRACT | Status: DC
  Administered 2019-01-06 – 2019-01-09 (×6): 2 via RESPIRATORY_TRACT
  Filled 2019-01-06: qty 8.8

## 2019-01-06 MED ORDER — MEPERIDINE HCL 50 MG/ML IJ SOLN: 6.2500 mg | INTRAMUSCULAR | Status: DC | PRN

## 2019-01-06 MED ORDER — GUAIFENESIN-DM 100-10 MG/5ML PO SYRP: 15.0000 mL | ORAL_SOLUTION | ORAL | Status: DC | PRN

## 2019-01-06 MED ORDER — ENOXAPARIN SODIUM 40 MG/0.4ML ~~LOC~~ SOLN
40.0000 mg | SUBCUTANEOUS | Status: DC
  Administered 2019-01-07 – 2019-01-08 (×2): 40 mg via SUBCUTANEOUS
  Filled 2019-01-06 (×2): qty 0.4

## 2019-01-06 MED ORDER — CLOPIDOGREL BISULFATE 75 MG PO TABS
75.0000 mg | ORAL_TABLET | Freq: Every day | ORAL | Status: DC
  Administered 2019-01-07 – 2019-01-08 (×2): 75 mg via ORAL
  Filled 2019-01-06 (×2): qty 1

## 2019-01-06 MED ORDER — ROCURONIUM BROMIDE 50 MG/5ML IV SOSY
PREFILLED_SYRINGE | INTRAVENOUS | Status: DC | PRN
  Administered 2019-01-06: 50 mg via INTRAVENOUS
  Administered 2019-01-06: 20 mg via INTRAVENOUS

## 2019-01-06 MED ORDER — PROTAMINE SULFATE 10 MG/ML IV SOLN
INTRAVENOUS | Status: AC
  Filled 2019-01-06: qty 5

## 2019-01-06 MED ORDER — CEFAZOLIN SODIUM-DEXTROSE 2-4 GM/100ML-% IV SOLN
2.0000 g | Freq: Three times a day (TID) | INTRAVENOUS | Status: AC
  Administered 2019-01-06 – 2019-01-07 (×2): 2 g via INTRAVENOUS
  Filled 2019-01-06 (×2): qty 100

## 2019-01-06 MED ORDER — ATORVASTATIN CALCIUM 10 MG PO TABS
10.0000 mg | ORAL_TABLET | Freq: Every day | ORAL | Status: DC
  Administered 2019-01-07 – 2019-01-08 (×2): 10 mg via ORAL
  Filled 2019-01-06 (×2): qty 1

## 2019-01-06 MED ORDER — FENTANYL CITRATE (PF) 100 MCG/2ML IJ SOLN
INTRAMUSCULAR | Status: AC
  Filled 2019-01-06: qty 2

## 2019-01-06 MED ORDER — HEPARIN SODIUM (PORCINE) 1000 UNIT/ML IJ SOLN
INTRAMUSCULAR | Status: DC | PRN
  Administered 2019-01-06: 7000 [IU] via INTRAVENOUS

## 2019-01-06 MED ORDER — SODIUM CHLORIDE 0.9 % IV SOLN
INTRAVENOUS | Status: DC | PRN
  Administered 2019-01-06: 25 ug/min via INTRAVENOUS

## 2019-01-06 MED ORDER — CEFAZOLIN SODIUM-DEXTROSE 2-4 GM/100ML-% IV SOLN
2.0000 g | INTRAVENOUS | Status: AC
  Administered 2019-01-06: 2 g via INTRAVENOUS

## 2019-01-06 MED ORDER — LIDOCAINE 2% (20 MG/ML) 5 ML SYRINGE
INTRAMUSCULAR | Status: DC | PRN
  Administered 2019-01-06: 60 mg via INTRAVENOUS

## 2019-01-06 MED ORDER — PROTAMINE SULFATE 10 MG/ML IV SOLN
INTRAVENOUS | Status: DC | PRN
  Administered 2019-01-06: 50 mg via INTRAVENOUS

## 2019-01-06 MED ORDER — PHENYLEPHRINE 40 MCG/ML (10ML) SYRINGE FOR IV PUSH (FOR BLOOD PRESSURE SUPPORT)
PREFILLED_SYRINGE | INTRAVENOUS | Status: DC | PRN
  Administered 2019-01-06: 120 ug via INTRAVENOUS
  Administered 2019-01-06: 40 ug via INTRAVENOUS
  Administered 2019-01-06: 120 ug via INTRAVENOUS

## 2019-01-06 MED ORDER — ASPIRIN EC 81 MG PO TBEC
81.0000 mg | DELAYED_RELEASE_TABLET | Freq: Every day | ORAL | Status: DC
  Administered 2019-01-07 – 2019-01-09 (×3): 81 mg via ORAL
  Filled 2019-01-06 (×3): qty 1

## 2019-01-06 MED ORDER — DILTIAZEM HCL ER COATED BEADS 180 MG PO CP24
180.0000 mg | ORAL_CAPSULE | Freq: Every day | ORAL | Status: DC
  Administered 2019-01-06 – 2019-01-08 (×3): 180 mg via ORAL
  Filled 2019-01-06 (×3): qty 1

## 2019-01-06 MED ORDER — PANTOPRAZOLE SODIUM 40 MG PO TBEC
40.0000 mg | DELAYED_RELEASE_TABLET | Freq: Every day | ORAL | Status: DC
  Administered 2019-01-07 – 2019-01-09 (×3): 40 mg via ORAL
  Filled 2019-01-06 (×3): qty 1

## 2019-01-06 MED ORDER — HYDRALAZINE HCL 20 MG/ML IJ SOLN: 5.0000 mg | INTRAMUSCULAR | Status: DC | PRN

## 2019-01-06 MED ORDER — FENTANYL CITRATE (PF) 100 MCG/2ML IJ SOLN
INTRAMUSCULAR | Status: DC | PRN
  Administered 2019-01-06: 150 ug via INTRAVENOUS

## 2019-01-06 MED ORDER — HEMOSTATIC AGENTS (NO CHARGE) OPTIME
TOPICAL | Status: DC | PRN
  Administered 2019-01-06: 2 via TOPICAL

## 2019-01-06 MED ORDER — MAGNESIUM SULFATE 2 GM/50ML IV SOLN: 2.0000 g | Freq: Every day | INTRAVENOUS | Status: DC | PRN

## 2019-01-06 MED ORDER — PROMETHAZINE HCL 25 MG/ML IJ SOLN: 6.2500 mg | INTRAMUSCULAR | Status: DC | PRN

## 2019-01-06 MED ORDER — LORATADINE 10 MG PO TABS
10.0000 mg | ORAL_TABLET | Freq: Every day | ORAL | Status: DC
  Administered 2019-01-07 – 2019-01-09 (×3): 10 mg via ORAL
  Filled 2019-01-06 (×3): qty 1

## 2019-01-06 MED ORDER — IODIXANOL 320 MG/ML IV SOLN
INTRAVENOUS | Status: DC | PRN
  Administered 2019-01-06: 50 mL via INTRAVENOUS

## 2019-01-06 MED ORDER — PROPOFOL 10 MG/ML IV BOLUS
INTRAVENOUS | Status: DC | PRN
  Administered 2019-01-06: 100 mg via INTRAVENOUS

## 2019-01-06 MED ORDER — FAMOTIDINE 20 MG PO TABS
20.0000 mg | ORAL_TABLET | Freq: Every day | ORAL | Status: DC
  Administered 2019-01-08: 20 mg via ORAL
  Filled 2019-01-06 (×2): qty 1

## 2019-01-06 MED ORDER — CEFAZOLIN SODIUM 1 G IJ SOLR
INTRAMUSCULAR | Status: AC
  Filled 2019-01-06: qty 20

## 2019-01-06 MED ORDER — FENTANYL CITRATE (PF) 100 MCG/2ML IJ SOLN
INTRAMUSCULAR | Status: DC | PRN
  Administered 2019-01-06: 50 ug via INTRAVENOUS
  Administered 2019-01-06: 150 ug via INTRAVENOUS
  Administered 2019-01-06: 50 ug via INTRAVENOUS

## 2019-01-06 MED ORDER — SODIUM CHLORIDE 0.9 % IV SOLN
INTRAVENOUS | Status: DC
  Administered 2019-01-06: 16:00:00 via INTRAVENOUS

## 2019-01-06 MED ORDER — HEMOSTATIC AGENTS (NO CHARGE) OPTIME
TOPICAL | Status: DC | PRN
  Administered 2019-01-06: 1 via TOPICAL

## 2019-01-06 MED ORDER — SODIUM CHLORIDE 0.9 % IV SOLN
INTRAVENOUS | Status: DC | PRN
  Administered 2019-01-06: 60 ug/min via INTRAVENOUS

## 2019-01-06 MED ORDER — DOCUSATE SODIUM 100 MG PO CAPS
100.0000 mg | ORAL_CAPSULE | Freq: Every day | ORAL | Status: DC
  Administered 2019-01-07 – 2019-01-09 (×3): 100 mg via ORAL
  Filled 2019-01-06 (×3): qty 1

## 2019-01-06 MED ORDER — HYDROMORPHONE HCL 1 MG/ML IJ SOLN: 0.2000 mg | INTRAMUSCULAR | Status: DC | PRN

## 2019-01-06 MED ORDER — METOPROLOL TARTRATE 5 MG/5ML IV SOLN: 2.0000 mg | INTRAVENOUS | Status: DC | PRN

## 2019-01-06 MED ORDER — DEXAMETHASONE SODIUM PHOSPHATE 10 MG/ML IJ SOLN
INTRAMUSCULAR | Status: DC | PRN
  Administered 2019-01-06: 10 mg via INTRAVENOUS

## 2019-01-06 MED ORDER — LABETALOL HCL 5 MG/ML IV SOLN: 10.0000 mg | INTRAVENOUS | Status: DC | PRN

## 2019-01-06 MED ORDER — SODIUM CHLORIDE 0.9 % IV SOLN
INTRAVENOUS | Status: DC
  Administered 2019-01-06 – 2019-01-07 (×2): via INTRAVENOUS

## 2019-01-06 MED ORDER — PROPOFOL 10 MG/ML IV BOLUS
INTRAVENOUS | Status: DC | PRN
  Administered 2019-01-06: 20 mg via INTRAVENOUS
  Administered 2019-01-06: 100 mg via INTRAVENOUS

## 2019-01-06 SURGICAL SUPPLY — 65 items
BANDAGE ACE 4X5 VEL STRL LF (GAUZE/BANDAGES/DRESSINGS) IMPLANT
BANDAGE ESMARK 6X9 LF (GAUZE/BANDAGES/DRESSINGS) IMPLANT
BNDG ESMARK 6X9 LF (GAUZE/BANDAGES/DRESSINGS) ×3
CANISTER PREVENA PLUS 150 (CANNISTER) ×1 IMPLANT
CANISTER SUCT 3000ML PPV (MISCELLANEOUS) ×3 IMPLANT
CANNULA VESSEL 3MM 2 BLNT TIP (CANNULA) ×3 IMPLANT
CATH EMB 3FR 80CM (CATHETERS) ×1 IMPLANT
CATH EMB 4FR 80CM (CATHETERS) ×1 IMPLANT
CATH EMB 5FR 80CM (CATHETERS) ×1 IMPLANT
CLIP VESOCCLUDE MED 24/CT (CLIP) ×3 IMPLANT
CLIP VESOCCLUDE SM WIDE 24/CT (CLIP) ×3 IMPLANT
COVER WAND RF STERILE (DRAPES) ×3 IMPLANT
CUFF TOURNIQUET SINGLE 24IN (TOURNIQUET CUFF) ×2 IMPLANT
CUFF TOURNIQUET SINGLE 34IN LL (TOURNIQUET CUFF) IMPLANT
CUFF TOURNIQUET SINGLE 44IN (TOURNIQUET CUFF) IMPLANT
DERMABOND ADVANCED (GAUZE/BANDAGES/DRESSINGS) ×1
DERMABOND ADVANCED .7 DNX12 (GAUZE/BANDAGES/DRESSINGS) ×2 IMPLANT
DRAIN CHANNEL 15F RND FF W/TCR (WOUND CARE) IMPLANT
DRAPE HALF SHEET 40X57 (DRAPES) IMPLANT
DRAPE X-RAY CASS 24X20 (DRAPES) IMPLANT
DRESSING PREVENA PLUS CUSTOM (GAUZE/BANDAGES/DRESSINGS) IMPLANT
DRSG PREVENA PLUS CUSTOM (GAUZE/BANDAGES/DRESSINGS) ×3
ELECT REM PT RETURN 9FT ADLT (ELECTROSURGICAL) ×3
ELECTRODE REM PT RTRN 9FT ADLT (ELECTROSURGICAL) ×2 IMPLANT
EVACUATOR SILICONE 100CC (DRAIN) IMPLANT
GLOVE BIOGEL PI IND STRL 7.5 (GLOVE) ×2 IMPLANT
GLOVE BIOGEL PI INDICATOR 7.5 (GLOVE) ×1
GLOVE SURG SS PI 7.5 STRL IVOR (GLOVE) ×3 IMPLANT
GOWN STRL REUS W/ TWL LRG LVL3 (GOWN DISPOSABLE) ×4 IMPLANT
GOWN STRL REUS W/ TWL XL LVL3 (GOWN DISPOSABLE) ×2 IMPLANT
GOWN STRL REUS W/TWL LRG LVL3 (GOWN DISPOSABLE) ×4
GOWN STRL REUS W/TWL XL LVL3 (GOWN DISPOSABLE) ×1
HEMOSTAT SNOW SURGICEL 2X4 (HEMOSTASIS) IMPLANT
INSERT FOGARTY SM (MISCELLANEOUS) IMPLANT
KIT BASIN OR (CUSTOM PROCEDURE TRAY) ×3 IMPLANT
KIT TURNOVER KIT B (KITS) ×3 IMPLANT
MARKER GRAFT CORONARY BYPASS (MISCELLANEOUS) IMPLANT
NS IRRIG 1000ML POUR BTL (IV SOLUTION) ×6 IMPLANT
PACK PERIPHERAL VASCULAR (CUSTOM PROCEDURE TRAY) ×3 IMPLANT
PAD ARMBOARD 7.5X6 YLW CONV (MISCELLANEOUS) ×6 IMPLANT
PATCH VASC XENOSURE 1CMX6CM (Vascular Products) ×1 IMPLANT
PATCH VASC XENOSURE 1X6 (Vascular Products) IMPLANT
SET COLLECT BLD 21X3/4 12 (NEEDLE) IMPLANT
STOPCOCK 4 WAY LG BORE MALE ST (IV SETS) IMPLANT
SUT ETHILON 3 0 PS 1 (SUTURE) IMPLANT
SUT GORETEX 6.0 TT13 (SUTURE) IMPLANT
SUT GORETEX 6.0 TT9 (SUTURE) IMPLANT
SUT PROLENE 5 0 C 1 24 (SUTURE) ×5 IMPLANT
SUT PROLENE 6 0 BV (SUTURE) ×6 IMPLANT
SUT PROLENE 6 0 CC (SUTURE) ×1 IMPLANT
SUT PROLENE 7 0 BV 1 (SUTURE) IMPLANT
SUT SILK 2 0 SH (SUTURE) ×3 IMPLANT
SUT SILK 3 0 (SUTURE)
SUT SILK 3-0 18XBRD TIE 12 (SUTURE) IMPLANT
SUT VIC AB 2-0 CT1 27 (SUTURE) ×2
SUT VIC AB 2-0 CT1 TAPERPNT 27 (SUTURE) ×4 IMPLANT
SUT VIC AB 3-0 SH 27 (SUTURE) ×2
SUT VIC AB 3-0 SH 27X BRD (SUTURE) ×4 IMPLANT
SUT VICRYL 4-0 PS2 18IN ABS (SUTURE) ×6 IMPLANT
SYR 3ML LL SCALE MARK (SYRINGE) ×2 IMPLANT
TOWEL GREEN STERILE (TOWEL DISPOSABLE) ×3 IMPLANT
TRAY FOLEY MTR SLVR 16FR STAT (SET/KITS/TRAYS/PACK) ×3 IMPLANT
TUBING EXTENTION W/L.L. (IV SETS) IMPLANT
UNDERPAD 30X30 (UNDERPADS AND DIAPERS) ×3 IMPLANT
WATER STERILE IRR 1000ML POUR (IV SOLUTION) ×3 IMPLANT

## 2019-01-06 SURGICAL SUPPLY — 67 items
BALLN MUSTANG 5.0X40 75 (BALLOONS) ×3
BALLN MUSTANG 5.0X40 75CM (BALLOONS) ×1
BALLOON MUSTANG 5.0X40 75 (BALLOONS) IMPLANT
CANISTER SUCT 3000ML PPV (MISCELLANEOUS) ×4 IMPLANT
CATH BEACON 5 .035 65 KMP TIP (CATHETERS) ×2 IMPLANT
CATH EMB 3FR 80CM (CATHETERS) ×4 IMPLANT
CATH EMB 4FR 40CM (CATHETERS) ×2 IMPLANT
CATH EMB 4FR 80CM (CATHETERS) ×2 IMPLANT
CATH POLY DUAL LUMEN OCCL 5FR (CATHETERS) ×2 IMPLANT
CLIP VESOCCLUDE MED 24/CT (CLIP) ×4 IMPLANT
CLIP VESOCCLUDE SM WIDE 24/CT (CLIP) ×4 IMPLANT
COVER WAND RF STERILE (DRAPES) ×4 IMPLANT
DERMABOND ADHESIVE PROPEN (GAUZE/BANDAGES/DRESSINGS) ×2
DERMABOND ADVANCED (GAUZE/BANDAGES/DRESSINGS) ×2
DERMABOND ADVANCED .7 DNX12 (GAUZE/BANDAGES/DRESSINGS) ×2 IMPLANT
DERMABOND ADVANCED .7 DNX6 (GAUZE/BANDAGES/DRESSINGS) IMPLANT
DRAIN CHANNEL 15F RND FF W/TCR (WOUND CARE) IMPLANT
DRAPE C-ARM 42X72 X-RAY (DRAPES) ×2 IMPLANT
DRAPE X-RAY CASS 24X20 (DRAPES) IMPLANT
DRESSING PREVENA PLUS CUSTOM (GAUZE/BANDAGES/DRESSINGS) IMPLANT
DRSG PREVENA PLUS CUSTOM (GAUZE/BANDAGES/DRESSINGS) ×4
ELECT REM PT RETURN 9FT ADLT (ELECTROSURGICAL) ×4
ELECTRODE REM PT RTRN 9FT ADLT (ELECTROSURGICAL) ×2 IMPLANT
EVACUATOR SILICONE 100CC (DRAIN) IMPLANT
GLOVE BIO SURGEON STRL SZ 6.5 (GLOVE) ×1 IMPLANT
GLOVE BIO SURGEONS STRL SZ 6.5 (GLOVE) ×1
GLOVE BIOGEL PI IND STRL 6.5 (GLOVE) IMPLANT
GLOVE BIOGEL PI IND STRL 7.5 (GLOVE) ×2 IMPLANT
GLOVE BIOGEL PI IND STRL 8 (GLOVE) IMPLANT
GLOVE BIOGEL PI INDICATOR 6.5 (GLOVE) ×8
GLOVE BIOGEL PI INDICATOR 7.5 (GLOVE) ×4
GLOVE BIOGEL PI INDICATOR 8 (GLOVE) ×2
GLOVE ECLIPSE 6.5 STRL STRAW (GLOVE) ×8 IMPLANT
GLOVE SURG SS PI 7.5 STRL IVOR (GLOVE) ×8 IMPLANT
GOWN STRL REUS W/ TWL LRG LVL3 (GOWN DISPOSABLE) ×4 IMPLANT
GOWN STRL REUS W/ TWL XL LVL3 (GOWN DISPOSABLE) ×2 IMPLANT
GOWN STRL REUS W/TWL LRG LVL3 (GOWN DISPOSABLE) ×8
GOWN STRL REUS W/TWL XL LVL3 (GOWN DISPOSABLE) ×2
HEMOSTAT SNOW SURGICEL 2X4 (HEMOSTASIS) ×4 IMPLANT
KIT BASIN OR (CUSTOM PROCEDURE TRAY) ×4 IMPLANT
KIT ENCORE 26 ADVANTAGE (KITS) ×2 IMPLANT
KIT PREVENA INCISION MGT 13 (CANNISTER) ×2 IMPLANT
KIT TURNOVER KIT B (KITS) ×4 IMPLANT
NS IRRIG 1000ML POUR BTL (IV SOLUTION) ×8 IMPLANT
PACK PERIPHERAL VASCULAR (CUSTOM PROCEDURE TRAY) ×4 IMPLANT
PAD ARMBOARD 7.5X6 YLW CONV (MISCELLANEOUS) ×8 IMPLANT
PATCH VASC XENOSURE 1CMX6CM (Vascular Products) ×2 IMPLANT
PATCH VASC XENOSURE 1X6 (Vascular Products) IMPLANT
SET COLLECT BLD 21X3/4 12 (NEEDLE) IMPLANT
SHEATH PINNACLE 5F 10CM (SHEATH) ×2 IMPLANT
STENT VIABAHN 5X50X120 (Permanent Stent) ×2 IMPLANT
STOPCOCK 4 WAY LG BORE MALE ST (IV SETS) ×2 IMPLANT
SUT ETHILON 3 0 PS 1 (SUTURE) IMPLANT
SUT PROLENE 5 0 C 1 24 (SUTURE) ×6 IMPLANT
SUT PROLENE 6 0 BV (SUTURE) ×6 IMPLANT
SUT VIC AB 2-0 CT1 27 (SUTURE) ×2
SUT VIC AB 2-0 CT1 TAPERPNT 27 (SUTURE) ×2 IMPLANT
SUT VIC AB 3-0 SH 27 (SUTURE) ×2
SUT VIC AB 3-0 SH 27X BRD (SUTURE) ×2 IMPLANT
SUT VICRYL 4-0 PS2 18IN ABS (SUTURE) ×4 IMPLANT
SYR 20CC LL (SYRINGE) ×2 IMPLANT
SYR 3ML LL SCALE MARK (SYRINGE) ×2 IMPLANT
TOWEL GREEN STERILE (TOWEL DISPOSABLE) ×4 IMPLANT
TUBING EXTENTION W/L.L. (IV SETS) IMPLANT
UNDERPAD 30X30 (UNDERPADS AND DIAPERS) ×4 IMPLANT
WATER STERILE IRR 1000ML POUR (IV SOLUTION) ×4 IMPLANT
WIRE SPARTACORE .014X190CM (WIRE) ×2 IMPLANT

## 2019-01-06 NOTE — Anesthesia Postprocedure Evaluation (Signed)
Anesthesia Post Note  Patient: [name]  Procedure(s) Performed: Lenox Ahr ARTERY (Left Leg Lower) PATCH ANGIOPLASTY WITH XENOSURE BIOLOGICAL PATCH OF POPITEAL TIBIAL PERITONEAL TRUNK (Left Leg Lower) ENDARTECTOMY POPITEAL TIBIAL PERITONEAL TRUNK (Left Leg Lower)     Patient location during evaluation: PACU Anesthesia Type: General Level of consciousness: awake and alert, patient cooperative and oriented Pain management: pain level controlled Vital Signs Assessment: post-procedure vital signs reviewed and stable Respiratory status: spontaneous breathing, nonlabored ventilation, respiratory function stable and patient connected to nasal cannula oxygen Cardiovascular status: blood pressure returned to baseline and stable Postop Assessment: no apparent nausea or vomiting Anesthetic complications: no    Last Vitals:  Vitals:   01/06/19 1855 01/06/19 1900  BP:  (!) 125/51  Pulse: 77 77  Resp: 19 20  Temp:    SpO2: 99% 99%    Last Pain:  Vitals:   01/06/19 1900  TempSrc:   PainSc: 0-No pain                 Stella Bortle,E. Avalie Oconnor

## 2019-01-06 NOTE — Transfer of Care (Signed)
Immediate Anesthesia Transfer of Care Note  Patient: Carla Dawson  Procedure(s) Performed: Redo Left Femoral Artery Endarterectomy (Left Groin) Patch Angioplasty Left Femoral Artery using Xenosure Biologic 1cm x 6cm Patch (Left Groin) Lower Extremity Angiogram (Left Leg Lower) Application Of Wound Vac (Left Groin) Insertion Of Popliteal artery  Viabahn Stent 86mm x 5cm (Left Leg Lower) Thrombectomy Left Femoral Artery to Left Popliteal artery (Left Leg Lower)  Patient Location: PACU  Anesthesia Type:General  Level of Consciousness: awake, alert  and oriented  Airway & Oxygen Therapy: Patient Spontanous Breathing and Patient connected to nasal cannula oxygen  Post-op Assessment: Report given to RN and Post -op Vital signs reviewed and stable  Post vital signs: Reviewed and stable  Last Vitals:  Vitals Value Taken Time  BP 142/50 01/06/2019 12:17 PM  Temp    Pulse 88 01/06/2019 12:18 PM  Resp 15 01/06/2019 12:18 PM  SpO2 97 % 01/06/2019 12:18 PM  Vitals shown include unvalidated device data.  Last Pain:  Vitals:   01/06/19 0713  TempSrc:   PainSc: 0-No pain         Complications: No apparent anesthesia complications

## 2019-01-06 NOTE — Anesthesia Preprocedure Evaluation (Addendum)
Anesthesia Evaluation  Patient identified by MRN, date of birth, ID band Patient awake    Reviewed: Allergy & Precautions, NPO status , Patient's Chart, lab work & pertinent test results  Airway Mallampati: I  TM Distance: >3 FB Neck ROM: Full    Dental   Pulmonary COPD, former smoker,    Pulmonary exam normal        Cardiovascular hypertension, Pt. on medications + Peripheral Vascular Disease  Normal cardiovascular exam     Neuro/Psych    GI/Hepatic GERD  Medicated and Controlled,  Endo/Other    Renal/GU      Musculoskeletal   Abdominal   Peds  Hematology   Anesthesia Other Findings   Reproductive/Obstetrics                             Anesthesia Physical Anesthesia Plan  ASA: III and emergent  Anesthesia Plan: General   Post-op Pain Management:    Induction: Intravenous  PONV Risk Score and Plan: 3 and Ondansetron and Midazolam  Airway Management Planned: Oral ETT  Additional Equipment:   Intra-op Plan:   Post-operative Plan: Extubation in OR  Informed Consent: I have reviewed the patients History and Physical, chart, labs and discussed the procedure including the risks, benefits and alternatives for the proposed anesthesia with the patient or authorized representative who has indicated his/her understanding and acceptance.       Plan Discussed with: CRNA and Surgeon  Anesthesia Plan Comments:         Anesthesia Quick Evaluation

## 2019-01-06 NOTE — Anesthesia Procedure Notes (Signed)
Procedure Name: Intubation Date/Time: 01/06/2019 8:22 AM Performed by: Trinna Post., CRNA Pre-anesthesia Checklist: Patient identified, Emergency Drugs available, Suction available, Patient being monitored and Timeout performed Patient Re-evaluated:Patient Re-evaluated prior to induction Oxygen Delivery Method: Circle system utilized Preoxygenation: Pre-oxygenation with 100% oxygen Induction Type: IV induction Ventilation: Mask ventilation without difficulty Laryngoscope Size: Mac and 3 Grade View: Grade I Tube type: Oral Tube size: 7.0 mm Number of attempts: 1 Airway Equipment and Method: Stylet Placement Confirmation: ETT inserted through vocal cords under direct vision,  positive ETCO2 and breath sounds checked- equal and bilateral Secured at: 22 cm Tube secured with: Tape Dental Injury: Teeth and Oropharynx as per pre-operative assessment

## 2019-01-06 NOTE — Progress Notes (Addendum)
Doppler signals in the recovery room were significantly different than in the operating room.  There was a faint monophasic posterior tibial signal.  I ordered a duplex which revealed that the femoral-popliteal bypass graft was occluded.  I discussed with the patient and her daughter that she needed to go back to the operating room for thrombectomy of her bypass graft.  [name]

## 2019-01-06 NOTE — H&P (Signed)
[facility] [place]  Patient name: Carla Dawson      MRN: [id number]        DOB: 09/19/1931          Sex: female   REASON FOR VISIT:    Follow up  [place]:    This is an 85 year old female whom I met in 2017 for peripheral vascular disease. Previously, [place] another state, she was dealing with bilateral rest pain. She underwent percutaneous revascularization of her right leg which was complicated by acute occlusion as well as perforation which required cover stenting. This ultimately led to surgical revascularization which appears to be a right femoral to tibioperoneal trunk bypass graft. On the left leg, the patient has undergone multiple percutaneous interventions including atherectomy and angioplasty of the superficial femoral and popliteal artery as well as angioplasty of anterior tibial artery.On 04-12-2018, she underwent emergent left femoral to popliteal bypass with gortex with VPA of the CFA and Popliteal.  She developed worsening claudication symptoms and on 11-03-2018 she had angioplasty of a CFA lesion.  She had a dissection and went to the OR the next day for occlusion where endarterectomy was performed.  She has a known stenosis in her left groin and is now having wound issues on her left heel  She continues to be medically managed for hypertension.  She takes a statin for hypercholesterolemia.  She is on dual anti-platelet therapy   PAST MEDICAL HISTORY:       Past Medical History:  Diagnosis Date  . Allergy    "maybe seasonal allergies" (07/08/2017)  . Arthritis    "hands" (07/08/2017)  . Chicken pox   . COPD (chronic obstructive pulmonary disease) ([place])   . High cholesterol    "took me off pills ~ 1-2 months ago" (07/08/2017)  . History of blood transfusion 2017   "when I had blood clot in my leg"  . Hypertension   . PAD (peripheral artery disease) ([place])    severe/notes  10/14/2016  . Pneumonia ~ 2015  . PVD (peripheral vascular disease) ([place])      FAMILY HISTORY:        Family History  Problem Relation Age of Onset  . Arthritis Mother   . Hypertension Mother   . Arthritis Father   . Hypertension Father   . Cancer Brother   . Hypertension Son     SOCIAL HISTORY:   Social History        Tobacco Use  . Smoking status: Former Smoker    Packs/day: 1.50    Years: 52.00    Pack years: 78.00    Start date: 12/09/1953    Last attempt to quit: 12/09/2005    Years since quitting: 12.9  . Smokeless tobacco: Never Used  Substance Use Topics  . Alcohol use: No     ALLERGIES:        Allergies  Allergen Reactions  . Lortab [Hydrocodone-Acetaminophen] Shortness Of Breath and Itching  . Morphine And Related Other (See Comments)    Unknown reaction     CURRENT MEDICATIONS:         Current Outpatient Medications  Medication Sig Dispense Refill  . albuterol (PROAIR HFA) 108 (90 Base) MCG/ACT inhaler Inhale 2 puffs into the lungs every 4 (four) hours as needed for wheezing or shortness of breath. 1 Inhaler 2  . albuterol (PROVENTIL) (2.5 MG/3ML) 0.083% nebulizer solution INHALE 1 NEBULE VIA NEBULIZER EVERY 4 HOURS AS NEEDED FOR WHEEZING  OR SHORTNESS OF BREATH (Patient taking differently: Take 2.5 mg by nebulization every 4 (four) hours as needed for wheezing or shortness of breath. ) 300 mL 2  . amLODipine (NORVASC) 5 MG tablet Take 1 tablet (5 mg total) by mouth at bedtime. (Patient taking differently: Take 5 mg by mouth at bedtime. Taking 1/2 tablet daily) 90 tablet 3  . aspirin EC 81 MG tablet Take 81 mg by mouth daily.    Marland Kitchen atorvastatin (LIPITOR) 10 MG tablet Take 1 tablet (10 mg total) by mouth daily at 6 PM.    . budesonide-formoterol (SYMBICORT) 160-4.5 MCG/ACT inhaler Inhale 2 puffs into the lungs 2 (two) times daily. 1 Inhaler 0  . cetirizine (ZYRTEC) 10 MG tablet Take 10 mg by mouth at bedtime.      . clopidogrel (PLAVIX) 75 MG tablet Take 75 mg by mouth at bedtime.     Marland Kitchen dextromethorphan (DELSYM) 30 MG/5ML liquid Take 30 mg by mouth 2 (two) times daily as needed for cough.    . dextromethorphan-guaiFENesin (MUCINEX DM) 30-600 MG 12hr tablet Take 0.5 tablets by mouth 2 (two) times daily.    . famotidine (PEPCID) 20 MG tablet Take 20 mg by mouth at bedtime.    . gabapentin (NEURONTIN) 100 MG capsule Take 1 capsule (100 mg total) by mouth 3 (three) times daily. 90 capsule 2  . Multiple Vitamin (MULTIVITAMIN WITH MINERALS) TABS tablet Take 1 tablet by mouth at bedtime.     Marland Kitchen omeprazole (PRILOSEC) 20 MG capsule Take 20 mg by mouth daily.    . predniSONE (DELTASONE) 10 MG tablet 2 daily until  Bette then one daily x 5 days and stop 100 tablet 0  . SPIRIVA RESPIMAT 2.5 MCG/ACT AERS INHALE 2 PUFFS INTO THE LUNGS DAILY (Patient taking differently: Inhale 2 puffs into the lungs daily. ) 1 Inhaler 11  . traMADol (ULTRAM) 50 MG tablet Take 1 tablet (50 mg total) by mouth every 6 (six) hours as needed for moderate pain. 20 tablet 0   No current facility-administered medications for this visit.     REVIEW OF SYSTEMS:   [X]  denotes positive finding, [ ]  denotes negative finding Cardiac  Comments:  Chest pain or chest pressure:    Shortness of breath upon exertion:    Short of breath when lying flat:    Irregular heart rhythm:        Vascular    Pain in calf, thigh, or hip brought on by ambulation:    Pain in feet at night that wakes you up from your sleep:     Blood clot in your veins:    Leg swelling:         Pulmonary    Oxygen at home:    Productive cough:     Wheezing:         Neurologic    Sudden weakness in arms or legs:     Sudden numbness in arms or legs:     Sudden onset of difficulty speaking or slurred speech:    Temporary loss of vision in one eye:     Problems with dizziness:         Gastrointestinal     Blood in stool:     Vomited blood:         Genitourinary    Burning when urinating:     Blood in urine:        Psychiatric    Major depression:  Hematologic    Bleeding problems:    Problems with blood clotting too easily:        Skin    Rashes or ulcers:        Constitutional    Fever or chills:      PHYSICAL EXAM:      Vitals:   11/30/18 0918  BP: (!) 141/67  Pulse: 77  Resp: 16  Temp: (!) 97 F (36.1 C)  SpO2: 96%  Weight: 112 lb (50.8 kg)  Height: 5' 1"  (1.549 m)    GENERAL: The patient is a well-nourished female, in no acute distress. The vital signs are documented above. CARDIAC: There is a regular rate and rhythm.  VASCULAR: non palpable pedal pulses PULMONARY: Non-labored respirations ABDOMEN: Soft and non-tender with normal pitched bowel sounds. Right groin incision healing nicely MUSCULOSKELETAL: There are no major deformities or cyanosis. NEUROLOGIC: No focal weakness or paresthesias are detected. SKIN: There are no ulcers or rashes noted. PSYCHIATRIC: The patient has a normal affect.  STUDIES:   Elevated velocities on left groin  MEDICAL ISSUES:   #1:  Post-op follow up of right femoral endarterecomy:  inciiosn healing nicely.  Will repeat u/s in 3 months  #2:  New problem:  Left heel wound with recent finding of left femoral stenosis: At her last arteriogram, a significant left femoral stenosis was identified.  Because the patient has had limited durability with percutaneous intervention, we have discussed proceeding with left femoral endarterectomy and revision of her patch angioplasty.  This will be scheduled for January 22.  She will stop her Plavix 5 days prior to her procedure.  The risks and benefits of procedure discussed with patient all questions were answered.    [name], MD [facility] [phone] Pager [phone]

## 2019-01-06 NOTE — Op Note (Signed)
Patient name: Tamelia Michalowski MRN: [id number] DOB: 09-23-1931 Sex: female  01/06/2019 Pre-operative Diagnosis: Occluded left femoral-popliteal bypass graft Post-operative diagnosis:  Same Surgeon:  [name] Assistants: Laurence Slate Procedure:   #1: Thrombectomy of left femoral-popliteal bypass graft   #2: Redo exposure of left popliteal and tibioperoneal trunk   #3: Endarterectomy of left popliteal and tibioperoneal trunk with bovine pericardial patch angioplasty   #4: Thrombectomy of left posterior tibial artery   #5: Placement of Praveena wound VAC Anesthesia: General Blood Loss: 200 cc Specimens: None  Findings: Neointima at the distal anastomosis.  The area of perforation was visualized in the tibioperoneal trunk.  Endarterectomy was performed as well as thrombectomy which established excellent inflow and good backbleeding.  Patch angioplasty was performed.  The patch goes from the hood of the Gore-Tex anastomosis down to the tibioperoneal trunk.  Indications: The patient earlier today underwent iliofemoral endarterectomy and patch angioplasty as well as stenting of the distal anastomosis of her bypass graft.  She lost Doppler signals in the PACU.  Ultrasound confirmed occlusion of her bypass graft.  She is being brought back for revascularization.  In the PACU, she did not have any pain in her foot.  She did not have motor or sensory loss.  Procedure:  The patient was identified in the holding area and taken to [place] 16  The patient was then placed supine on the table. general anesthesia was administered.  The patient was prepped and draped in the usual sterile fashion.  A time out was called and antibiotics were administered.  The patient's previous medial below-knee incision was opened with a #10 blade.  Cautery was used about subcutaneous tissue.  The gastrocnemius muscle was reflected posteriorly.  Sharp dissection was used to expose the Gore-Tex bypass graft as well  as the popliteal artery and tibioperoneal trunk down to the bifurcation.  Once adequate exposure was performed, the patient was fully heparinized.  ACT levels were used to monitor the heparin dosing.  A tourniquet was placed on the upper thigh and the leg was exsanguinated with an Esmarch.  I then used a #11 blade to make an arteriotomy in the popliteal artery.  I used Potts scissors to open the artery longitudinally up onto the hood of the bypass graft and then down into the tibioperoneal trunk.  I performed endarterectomy and removed neointima at the distal anastomosis.  I selected a bovine pericardial patch and perform patch angioplasty beginning on the hood of the bypass graft going down the popliteal artery onto the tibioperoneal trunk to its bifurcation.  This was done with 6-0 Prolene.  Prior to completion, I passed a Fogarty catheter down the posterior tibial artery which went all the way across the ankle.  No significant thrombus was evacuated and there was good backbleeding.  The tourniquet was then let down.  I advanced a #5 Fogarty catheter up the bypass graft and perform thrombectomy of the bypass graft.  A negative pass was performed and there was excellent inflow through the graft.  The anastomosis was then completed.  The patient had brisk Doppler signals in the popliteal artery and a multiphasic signal in the posterior tibial artery at the ankle.  Heparin was used to reverse the protamine.  Once hemostasis was satisfactory the incision was closed with 2 layers of 2-0 Vicryl, followed by 4-0 Vicryl and the skin and Dermabond.  A Praveena wound VAC was replaced on the groin incision.  There were no immediate  complications.   Disposition: To PACU stable.   Theotis Burrow, M.D. [facility] [place] Office: [phone] Pager:  [phone]

## 2019-01-06 NOTE — Op Note (Signed)
Patient name: Carla Tercero MRN: [id number] DOB: 07-05-1931 Sex: female  01/06/2019 Pre-operative Diagnosis: left leg claudication Post-operative diagnosis:  Same Surgeon:  [name] Assistants:  Jasmine Awe, RNFA Procedure:   #1: Left common femoral and external iliac artery endarterectomy with bovine pericardial patch angioplasty   #2: Redo left femoral artery exposure   #3: Thrombectomy of left femoral-popliteal bypass graft   #4: Left lower extremity angiogram   #5: Stent, left popliteal artery (Viabahn 5x50)   #6: Praveena incisional wound VAC Anesthesia: General Blood Loss: 200cc Specimens: None  Findings: Neointima and residual atherosclerotic plaque extending up into the external iliac artery was removed and patch angioplasty was performed.  During the endarterectomy, no backbleeding was encountered from the femoral-popliteal bypass graft.  After had completed the patch repair, I tried to perform thrombectomy of the graft.  Subacute thrombus was evacuated as well as neointima.  I still did not get adequate backbleeding.  I used a over-the-wire Fogarty catheter.  It looked like there was residual neointima at the distal anastomosis and so this was stented.  There was a Doppler signal in the posterior tibial artery at the end of the case.  Indications: The patient is undergone multiple procedures to maintain blood flow to her legs.  Her most recent arteriogram indicated significant stenosis within the common femoral and distal left external iliac artery proximal to her bypass graft.  I felt that this needed to be addressed in order to maintain patency of her bypass graft.  Procedure:  The patient was identified in the holding area and taken to [place] 11  The patient was then placed supine on the table. general anesthesia was administered.  The patient was prepped and draped in the usual sterile fashion.  A time out was called and antibiotics were administered.  The  patient's previous left femoral longitudinal incision was opened with a 10 blade.  Cautery was used about subcutaneous tissue.  Using cautery and sharp dissection I dissected out the femoral-popliteal Gore-Tex bypass graft as well as the profundofemoral artery.  I exposed the common femoral and external iliac artery well up under the inguinal ligament.  Crossing iliac veins were divided between silk ties.  Once adequate exposure was obtained, the patient was fully heparinized.  Heparin levels were monitored with ACT measurements and redosed appropriately.  The external iliac artery was occluded with a Henley clamp and the profundofemoral artery was occluded with a baby Gregory clamp.  A #11 blade was used to make an arteriotomy in the middle of the previous patch on the common femoral artery.  I used Potts scissors to open this up to the distal external iliac artery and then down onto the hood of the femoral-popliteal bypass graft.  There was a significant amount of neointima within the common femoral artery.  An elevator was used to create a good plane.  A endarterectomy of the common femoral artery was then performed.  I inserted a #4 Fogarty catheter up into the external iliac artery so that I could continue the endarterectomy up into the distal external iliac artery.  All of the atherosclerotic plaque and neointima was removed.  I did place 2 tacking sutures to tack the distal end of the plaque.  There was excellent inflow at this time.  A bovine pericardial patch was selected and patch angioplasty was performed with running 5-0 Prolene.  Prior to completion, the appropriate flushing maneuvers were performed.  There was excellent backbleeding from the  profundofemoral artery and excellent inflow from the iliac artery however there is no backbleeding from the bypass graft.  I selected a 4 Fogarty catheter and performed thrombectomy of the bypass graft.  The catheter hung up at the distal anastomosis.  I did remove  neointima and some chronic thrombus.  I was able to advance a #3 Fogarty catheter all the way down the leg.  Additional neointima was removed.  I still did not get good backbleeding.  I therefore elected to place a Kumpe catheter into the bypass graft and performed angiography.  This revealed two-vessel runoff and a filling defect at the distal anastomosis.  I advanced a 014 [place] core wire into the peroneal artery and then used a over-the-wire Fogarty catheter.  I was unable to remove the debris at the distal anastomosis as confirmed with angiography and therefore I elected to stent this using a 5 x 50 via bond stent which was molded with a 5 mm balloon.  A completion arteriogram was performed.  This showed the bypass graft to be patent.  There was an area of perforation within a peroneal branch as well as within the tibioperoneal trunk.  I felt that this should resolve once the heparin was reversed.  I now had good backbleeding from the bypass graft.  The patch angioplasty anastomosis was then completed and the heparin was reversed with protamine.  Hemostasis was achieved after irrigating the groin.  There were brisk Doppler signals in the common femoral profundofemoral artery.  I reapproximated subcutaneous tissue with multiple layers of 2-0 and 3-0 Vicryl and the skin was closed with 4-0 Vicryl and Dermabond.  A Praveena wound VAC was placed.  I listen at the ankle for Doppler signals and was able to hear a posterior tibial signal.  The patient was unsuccessful extubated and taken recovery in stable condition.  There were no immediate complications.   Disposition: To PACU stable.   Theotis Burrow, M.D. [facility] [place] Office: [phone] Pager:  [phone]

## 2019-01-06 NOTE — Anesthesia Procedure Notes (Signed)
Arterial Line Insertion Start/End1/29/2020 7:45 AM Performed by: Trinna Post., CRNA, CRNA  Patient location: Pre-op. Preanesthetic checklist: patient identified, IV checked, site marked, risks and benefits discussed, surgical consent, monitors and equipment checked, pre-op evaluation, timeout performed and anesthesia consent Lidocaine 1% used for infiltration radial was placed Catheter size: 20 G Hand hygiene performed  and maximum sterile barriers used   Attempts: 1 Procedure performed without using ultrasound guided technique. Following insertion, Biopatch and dressing applied. Post procedure assessment: normal  Patient tolerated the procedure well with no immediate complications.

## 2019-01-06 NOTE — Anesthesia Postprocedure Evaluation (Signed)
Anesthesia Post Note  Patient: Carla Dawson  Procedure(s) Performed: Redo Left Femoral Artery Endarterectomy (Left Groin) Patch Angioplasty Left Femoral Artery using Xenosure Biologic 1cm x 6cm Patch (Left Groin) Lower Extremity Angiogram (Left Leg Lower) Application Of Wound Vac (Left Groin) Insertion Of Popliteal artery  Viabahn Stent 55mm x 5cm (Left Leg Lower) Thrombectomy Left Femoral Artery to Left Popliteal artery (Left Leg Lower)     Patient location during evaluation: PACU Anesthesia Type: General Level of consciousness: awake Pain management: pain level controlled Vital Signs Assessment: post-procedure vital signs reviewed and stable Respiratory status: spontaneous breathing Cardiovascular status: stable Postop Assessment: no apparent nausea or vomiting Anesthetic complications: no    Last Vitals:  Vitals:   01/06/19 1505 01/06/19 1510  BP: (!) 138/52   Pulse: 76 78  Resp: 18 15  Temp:    SpO2: 99% 100%    Last Pain:  Vitals:   01/06/19 1426  TempSrc:   PainSc: 8     LLE Motor Response: Purposeful movement (01/06/19 1510)            Damyn Weitzel

## 2019-01-06 NOTE — Transfer of Care (Signed)
Immediate Anesthesia Transfer of Care Note  Patient: Carla Dawson  Procedure(s) Performed: Lenox Ahr ARTERY (Left Leg Lower) PATCH ANGIOPLASTY WITH XENOSURE BIOLOGICAL PATCH OF POPITEAL TIBIAL PERITONEAL TRUNK (Left Leg Lower) ENDARTECTOMY POPITEAL TIBIAL PERITONEAL TRUNK (Left Leg Lower)  Patient Location: PACU  Anesthesia Type:General  Level of Consciousness: awake, oriented and patient cooperative  Airway & Oxygen Therapy: Patient Spontanous Breathing and Patient connected to face mask oxygen  Post-op Assessment: Report given to RN and Post -op Vital signs reviewed and stable  Post vital signs: Reviewed and stable  Last Vitals:  Vitals Value Taken Time  BP 148/55 01/06/2019  6:15 PM  Temp 36.7 C 01/06/2019  6:15 PM  Pulse 80 01/06/2019  6:20 PM  Resp 16 01/06/2019  6:20 PM  SpO2 100 % 01/06/2019  6:20 PM  Vitals shown include unvalidated device data.  Last Pain:  Vitals:   01/06/19 1426  TempSrc:   PainSc: 8          Complications: No apparent anesthesia complications

## 2019-01-06 NOTE — Telephone Encounter (Signed)
Spoke to [employer], advised to review my last ov in which I addressed increased risk for surgery that was not in my opinion prohibitive for planned procedure

## 2019-01-06 NOTE — Anesthesia Procedure Notes (Signed)
Procedure Name: Intubation Date/Time: 01/06/2019 3:39 PM Performed by: Orlie Dakin, CRNA Pre-anesthesia Checklist: Patient identified, Emergency Drugs available, Suction available and Patient being monitored Patient Re-evaluated:Patient Re-evaluated prior to induction Oxygen Delivery Method: Circle system utilized Preoxygenation: Pre-oxygenation with 100% oxygen Induction Type: IV induction Ventilation: Mask ventilation without difficulty Laryngoscope Size: Miller and 3 Grade View: Grade I Tube type: Oral Tube size: 7.0 mm Number of attempts: 1 Airway Equipment and Method: Stylet Placement Confirmation: ETT inserted through vocal cords under direct vision,  positive ETCO2 and breath sounds checked- equal and bilateral Secured at: 20 cm Tube secured with: Tape Dental Injury: Teeth and Oropharynx as per pre-operative assessment

## 2019-01-06 NOTE — Anesthesia Postprocedure Evaluation (Signed)
Anesthesia Post Note  Patient: [name]  Procedure(s) Performed: Redo Left Femoral Artery Endarterectomy (Left Groin) Patch Angioplasty Left Femoral Artery using Xenosure Biologic 1cm x 6cm Patch (Left Groin) Lower Extremity Angiogram (Left Leg Lower) Application Of Wound Vac (Left Groin) Insertion Of Popliteal artery  Viabahn Stent 44mm x 5cm (Left Leg Lower) Thrombectomy Left Femoral Artery to Left Popliteal artery (Left Leg Lower)     Patient location during evaluation: PACU Anesthesia Type: General Level of consciousness: awake Pain management: pain level controlled Vital Signs Assessment: post-procedure vital signs reviewed and stable Respiratory status: spontaneous breathing Cardiovascular status: stable Postop Assessment: no apparent nausea or vomiting Anesthetic complications: no    Last Vitals:  Vitals:   01/06/19 1355 01/06/19 1400  BP:    Pulse: 71 72  Resp: 12 14  Temp:    SpO2: 100% 100%    Last Pain:  Vitals:   01/06/19 1426  TempSrc:   PainSc: 8                  Kandi Brusseau

## 2019-01-07 ENCOUNTER — Inpatient Hospital Stay (HOSPITAL_COMMUNITY): Payer: Medicare Other

## 2019-01-07 ENCOUNTER — Encounter (HOSPITAL_COMMUNITY): Payer: Self-pay | Admitting: Surgery

## 2019-01-07 DIAGNOSIS — I739 Peripheral vascular disease, unspecified: Secondary | ICD-10-CM

## 2019-01-07 LAB — BASIC METABOLIC PANEL
Anion gap: 7 (ref 5–15)
BUN: 10 mg/dL (ref 8–23)
CO2: 26 mmol/L (ref 22–32)
Calcium: 7.6 mg/dL — ABNORMAL LOW (ref 8.9–10.3)
Chloride: 105 mmol/L (ref 98–111)
Creatinine, Ser: 0.68 mg/dL (ref 0.44–1.00)
GFR calc Af Amer: >60 mL/min (ref >60)
GFR calc non Af Amer: >60 mL/min (ref >60)
GLUCOSE: 134 mg/dL — AB (ref 70–99)
Potassium: 4.3 mmol/L (ref 3.5–5.1)
Sodium: 138 mmol/L (ref 135–145)

## 2019-01-07 LAB — CBC
HCT: 24.6 % — ABNORMAL LOW (ref 36.0–46.0)
Hemoglobin: 7.5 g/dL — ABNORMAL LOW (ref 12.0–15.0)
MCH: 25.4 pg — ABNORMAL LOW (ref 26.0–34.0)
MCHC: 30.5 g/dL (ref 30.0–36.0)
MCV: 83.4 fL (ref 80.0–100.0)
Platelets: 221 10*3/uL (ref 150–400)
RBC: 2.95 MIL/uL — ABNORMAL LOW (ref 3.87–5.11)
RDW: 15.5 % (ref 11.5–15.5)
WBC: 9.9 10*3/uL (ref 4.0–10.5)
nRBC: 0 % (ref 0.0–0.2)

## 2019-01-07 LAB — HEMOGLOBIN AND HEMATOCRIT, BLOOD
HCT: 27.4 % — ABNORMAL LOW (ref 36.0–46.0)
Hemoglobin: 8.5 g/dL — ABNORMAL LOW (ref 12.0–15.0)

## 2019-01-07 LAB — PREPARE RBC (CROSSMATCH)

## 2019-01-07 MED ORDER — SODIUM CHLORIDE 0.9% IV SOLUTION
Freq: Once | INTRAVENOUS | Status: AC
  Administered 2019-01-07: 15:00:00 via INTRAVENOUS

## 2019-01-07 NOTE — Plan of Care (Signed)

## 2019-01-07 NOTE — Evaluation (Signed)
Occupational Therapy Evaluation Patient Details Name: Carla Dawson MRN: [id number] DOB: 1931/09/23 Today's Date: 01/07/2019    History of Present Illness Pt is an 85 yo female s/p occluded L femoral bypass graft s/p thrombectomy, L popliteal  graft and L posterior tibial artery bypass graft on 01/06/19. PMHx: HTN, arthritis, PVD, PAD, previous bypass graft in RLE per patient.   Clinical Impression   Pt is an 85 yo female s/p above dx. Pt PTA: living with daughter's family and receiving home health PT and RN. Pt currently, limited by SOB- SPO2 levels remain >87% on RA post exertion with dyspnea noted. Pt requires 2LO2 to increase to >90% after o2 applied and pursed lip breathing techniques. Pt's daughter present for session. Pt performing mobility and transfers with minguardA in room only. Pt limited by pain in LLE, increased need for assist with LB ADL, and poor mobility with pt unsafe to be home alone. Pt would benefit from continued OT skilled services for ADL, mobility and safety in SNF setting.    Follow Up Recommendations  SNF;Supervision/Assistance - 24 hour    Equipment Recommendations  None recommended by OT    Recommendations for Other Services PT consult(ordered)     Precautions / Restrictions Precautions Precautions: Fall;Other (comment)(wears O2) Precaution Comments: watch o2 Restrictions Weight Bearing Restrictions: No      Mobility Bed Mobility Overal bed mobility: Needs Assistance Bed Mobility: Sidelying to Sit;Supine to Sit   Sidelying to sit: Min guard Supine to sit: Min guard     General bed mobility comments: requires increased time due to SOB  Transfers Overall transfer level: Needs assistance Equipment used: Rolling walker (2 wheeled) Transfers: Sit to/from Stand Sit to Stand: Min guard         General transfer comment: pt requires assist for stability and requires elevated surface    Balance Overall balance assessment: Mild deficits  observed, not formally tested                                         ADL either performed or assessed with clinical judgement   ADL Overall ADL's : Needs assistance/impaired Eating/Feeding: Independent   Grooming: Min guard;Sitting;Standing   Upper Body Bathing: Minimal assistance;Sitting;Cueing for safety   Lower Body Bathing: Moderate assistance;Cueing for safety;Cueing for sequencing;Sitting/lateral leans;Sit to/from stand   Upper Body Dressing : Minimal assistance;Cueing for safety;Cueing for sequencing;Sitting;Standing   Lower Body Dressing: Moderate assistance;Cueing for safety;Cueing for sequencing;Sitting/lateral leans;Sit to/from stand   Toilet Transfer: Min guard;Cueing for safety;Cueing for sequencing;BSC   Toileting- Water quality scientist and Hygiene: Moderate assistance;Cueing for safety;Cueing for sequencing;Sitting/lateral lean;Sit to/from stand       Functional mobility during ADLs: Min guard;Rolling walker;Cueing for safety General ADL Comments: Pt requiring assist for LB dressing, modA; Pt minA for UB ADL due to SOB upon exertion. Pt performing grooming in standing at sink modA for toilet hygiene.     Vision Baseline Vision/History: No visual deficits Vision Assessment?: No apparent visual deficits     Perception     Praxis      Pertinent Vitals/Pain Pain Assessment: 0-10 Pain Score: 8  Pain Location: L quads Pain Descriptors / Indicators: Crushing;Discomfort Pain Intervention(s): Monitored during session;Premedicated before session;RN gave pain meds during session     Hand Dominance Right   Extremity/Trunk Assessment Upper Extremity Assessment Upper Extremity Assessment: Generalized weakness   Lower Extremity Assessment Lower Extremity Assessment:  Generalized weakness;LLE deficits/detail LLE Deficits / Details: weakness in BLEs near incisions and mostly in quads- poor ability to lift foot off of ground LLE Coordination:  decreased gross motor   Cervical / Trunk Assessment Cervical / Trunk Assessment: Normal   Communication Communication Communication: HOH   Cognition Arousal/Alertness: Awake/alert Behavior During Therapy: WFL for tasks assessed/performed Overall Cognitive Status: Within Functional Limits for tasks assessed                                     General Comments       Exercises     Shoulder Instructions      Home Living Family/patient expects to be discharged to:: Private residence Living Arrangements: Children Available Help at Discharge: Family;Available PRN/intermittently(travel often and she will be left alone) Type of Home: House Home Access: Stairs to enter [employer] of Steps: 3-4 Entrance Stairs-Rails: Right Home Layout: Multi-level;Full bath on main level(pt stays on 1st floor)     Bathroom Shower/Tub: Walk-in English as a second language teacher bedroom)   Bathroom Toilet: Standard(puts BSC over commode) Bathroom Accessibility: No   Home Equipment: Shower seat - built in;Walker - 4 wheels;Hand held shower head;Wheelchair - manual(borrowed W/C)   Additional Comments: Pt has a finger pulse ox at home      Prior Functioning/Environment Level of Independence: Needs assistance  Gait / Transfers Assistance Needed: mobility with no AD- just watch O2 tubing. ADL's / Homemaking Assistance Needed: supervision A with ADL; IADL performed for her            OT Problem List: Decreased activity tolerance;Decreased safety awareness;Pain      OT Treatment/Interventions: Self-care/ADL training;Energy conservation;DME and/or AE instruction;Therapeutic activities;Patient/family education;Balance training    OT Goals(Current goals can be found in the care plan section) Acute Rehab OT Goals Patient Stated Goal: to get stronger to go home OT Goal Formulation: With patient Time For Goal Achievement: 01/21/19 Potential to Achieve Goals: Good ADL Goals Pt Will  Perform Lower Body Dressing: with set-up;with adaptive equipment Pt Will Perform Toileting - Clothing Manipulation and hygiene: with supervision;with adaptive equipment Additional ADL Goal #1: Pt will perform ADL functional transfers and ADL functional mobility with fair balance and supervisionA  OT Frequency: Min 2X/week   Barriers to D/C:            Co-evaluation              AM-PAC OT "6 Clicks" Daily Activity     Outcome Measure Help from another person eating meals?: None Help from another person taking care of personal grooming?: None Help from another person toileting, which includes using toliet, bedpan, or urinal?: A Little Help from another person bathing (including washing, rinsing, drying)?: A Lot Help from another person to put on and taking off regular upper body clothing?: A Little Help from another person to put on and taking off regular lower body clothing?: A Lot 6 Click Score: 18   End of Session Equipment Utilized During Treatment: Gait belt;Rolling walker Nurse Communication: Mobility status  Activity Tolerance: Patient tolerated treatment well;Patient limited by pain Patient left: in bed;with call bell/phone within reach;with family/visitor present  OT Visit Diagnosis: Unsteadiness on feet (R26.81);Muscle weakness (generalized) (M62.81)                Time: 9323-5573 OT Time Calculation (min): 59 min Charges:  OT General Charges $OT Visit: 1 Visit OT Evaluation $OT Eval Moderate Complexity:  1 Mod OT Treatments $Self Care/Home Management : 23-37 mins $Neuromuscular Re-education: 8-22 mins  Darryl Nestle) Marsa Aris OTR/L Acute Rehabilitation Services Pager: [phone] Office: [phone]   Carla Dawson 01/07/2019, 11:01 AM

## 2019-01-07 NOTE — Progress Notes (Signed)
PT Cancellation Note  Patient Details Name: Carla Dawson MRN: [id number] DOB: [birth year]   Cancelled Treatment:    Reason Eval/Treat Not Completed: Other (comment). Pt receiving transfusion and declining physical therapy services at this time. Will follow up tomorrow.  Ellamae Sia, PT, DPT Acute Rehabilitation Services Pager [phone] Office [phone]    Willy Eddy 01/07/2019, 4:57 PM

## 2019-01-07 NOTE — Progress Notes (Addendum)
[facility]  Subjective  - Doing very well this am.   Objective (!) 130/54 73 98.3 F (36.8 C) (Oral) 19 100%  Intake/Output Summary (Last 24 hours) at 01/07/2019 0812 Last data filed at 01/07/2019 0530 Gross per 24 hour  Intake 3659.6 ml  Output 2325 ml  Net 1334.6 ml    Brisk PT doppler signal and AT Left LE warm and active range of motion intact, compartments soft.  Left below knee pop incision healing well Left groin soft incisional vac in place.  No drainage. Abdomin NTTP Heart RRR Lungs non labored breathing  Assessment/Planning: Left leg claudication follwed post op bypass graft occlusion and take back to OR for Procedure:   #1: Thrombectomy of left femoral-popliteal bypass graft                         #2: Redo exposure of left popliteal and tibioperoneal trunk                         #3: Endarterectomy of left popliteal and tibioperoneal trunk with bovine pericardial patch angioplasty                         #4: Thrombectomy of left posterior tibial artery POD # 1   Comfortable with no new complaints other than her pillow is too hard.   Planning PT/OT eval and mobility Tolerating PO's well ate full breakfast HGB 7.5 EBL 200 ml transfused 1 unit of PRBC during surgery chronic anemia with starting HGB 7.8.  Will transfuse 1 additional unit of PRBC today. Patent blood flow to the left LE.  Roxy Horseman 01/07/2019 8:12 AM --  Laboratory Lab Results: Recent Labs    01/06/19 1029 01/07/19 0529  WBC  --  9.9  HGB 7.8* 7.5*  HCT 23.0* 24.6*  PLT  --  221   BMET Recent Labs    01/06/19 1029 01/07/19 0529  NA 139 138  K 4.0 4.3  CL  --  105  CO2  --  26  GLUCOSE  --  134*  BUN  --  10  CREATININE  --  0.68  CALCIUM  --  7.6*    COAG Lab Results  Component Value Date   INR 1.05 12/31/2018   INR 1.02 11/04/2018   INR 1.09 04/12/2018   No results found for: PTT  POD#1,  Brisk left PT signal Incisions  ok D/c when activity level appropriate  Wells Brabham

## 2019-01-07 NOTE — Plan of Care (Signed)
  Problem: Education: °Goal: Knowledge of General Education information will improve °Description: Including pain rating scale, medication(s)/side effects and non-pharmacologic comfort measures °Outcome: Progressing °  °Problem: Clinical Measurements: °Goal: Diagnostic test results will improve °Outcome: Progressing °  °Problem: Elimination: °Goal: Will not experience complications related to bowel motility °Outcome: Progressing °Goal: Will not experience complications related to urinary retention °Outcome: Progressing °  °

## 2019-01-07 NOTE — Progress Notes (Signed)
[facility]  Patient Status: Active (receiving services up to time of hospitalization)  [facility] is providing the following services: RN, OT and HHA  If patient discharges after hours, please call [phone].   Janae Sauce 01/07/2019, 3:10 PM

## 2019-01-07 NOTE — Progress Notes (Signed)
ABI/TBI exam completed.  Please see preliminary notes on CV PROC under chart review.  Carla Dawson H Mylene Bow(RDMS RVT) 01/07/19 12:37 PM

## 2019-01-08 LAB — BPAM RBC
BLOOD PRODUCT EXPIRATION DATE: 202002222359
Blood Product Expiration Date: 202002242359
ISSUE DATE / TIME: 202001291542
ISSUE DATE / TIME: 202001301451
UNIT TYPE AND RH: 7300
Unit Type and Rh: 7300

## 2019-01-08 LAB — TYPE AND SCREEN
ABO/RH(D): B POS
ANTIBODY SCREEN: NEGATIVE
Unit division: 0
Unit division: 0

## 2019-01-08 LAB — CBC
HCT: 27.7 % — ABNORMAL LOW (ref 36.0–46.0)
Hemoglobin: 8.4 g/dL — ABNORMAL LOW (ref 12.0–15.0)
MCH: 25.5 pg — AB (ref 26.0–34.0)
MCHC: 30.3 g/dL (ref 30.0–36.0)
MCV: 83.9 fL (ref 80.0–100.0)
Platelets: 184 10*3/uL (ref 150–400)
RBC: 3.3 MIL/uL — ABNORMAL LOW (ref 3.87–5.11)
RDW: 15.2 % (ref 11.5–15.5)
WBC: 9.1 10*3/uL (ref 4.0–10.5)
nRBC: 0 % (ref 0.0–0.2)

## 2019-01-08 LAB — BASIC METABOLIC PANEL
Anion gap: 5 (ref 5–15)
BUN: 10 mg/dL (ref 8–23)
CO2: 28 mmol/L (ref 22–32)
Calcium: 8.4 mg/dL — ABNORMAL LOW (ref 8.9–10.3)
Chloride: 110 mmol/L (ref 98–111)
Creatinine, Ser: 0.64 mg/dL (ref 0.44–1.00)
GFR calc Af Amer: >60 mL/min (ref >60)
GFR calc non Af Amer: >60 mL/min (ref >60)
Glucose, Bld: 93 mg/dL (ref 70–99)
Potassium: 3.8 mmol/L (ref 3.5–5.1)
Sodium: 143 mmol/L (ref 135–145)

## 2019-01-08 MED ORDER — DILTIAZEM HCL ER COATED BEADS 180 MG PO CP24: 180.0000 mg | ORAL_CAPSULE | Freq: Every day | ORAL | Status: DC

## 2019-01-08 MED ORDER — DM-GUAIFENESIN ER 30-600 MG PO TB12
1.0000 | ORAL_TABLET | Freq: Two times a day (BID) | ORAL | Status: DC | PRN
  Filled 2019-01-08: qty 1

## 2019-01-08 MED ORDER — TRAMADOL HCL 50 MG PO TABS: 50.0000 mg | ORAL_TABLET | Freq: Four times a day (QID) | ORAL | 0 refills | Status: DC | PRN

## 2019-01-08 NOTE — Progress Notes (Signed)
    Subjective  - POD #2  Feels like she is catching a cold Walked yesterday   Physical Exam:  Brisk left PT signal Incisions ok       Assessment/Plan:  POD #2  Plan to monitor her 1 more day Anticipate d/c in am Needs to ambulate today  Wells Brabham 01/08/2019 8:05 AM --  Vitals:   01/08/19 0438 01/08/19 0801  BP: (!) 132/58 (!) 145/55  Pulse: (!) 58 62  Resp: 16 18  Temp: 97.9 F (36.6 C) 98.3 F (36.8 C)  SpO2: 100% 100%    Intake/Output Summary (Last 24 hours) at 01/08/2019 0805 Last data filed at 01/08/2019 0802 Gross per 24 hour  Intake 2419.56 ml  Output 600 ml  Net 1819.56 ml     Laboratory CBC    Component Value Date/Time   WBC 9.1 01/08/2019 0356   HGB 8.4 (L) 01/08/2019 0356   HCT 27.7 (L) 01/08/2019 0356   PLT 184 01/08/2019 0356    BMET    Component Value Date/Time   NA 143 01/08/2019 0356   NA 141 04/23/2013   K 3.8 01/08/2019 0356   CL 110 01/08/2019 0356   CO2 28 01/08/2019 0356   GLUCOSE 93 01/08/2019 0356   BUN 10 01/08/2019 0356   BUN 13 04/23/2013   CREATININE 0.64 01/08/2019 0356   CREATININE 0.76 06/20/2017 1534   CALCIUM 8.4 (L) 01/08/2019 0356   GFRNONAA >60 01/08/2019 0356   GFRAA >60 01/08/2019 0356    COAG Lab Results  Component Value Date   INR 1.05 12/31/2018   INR 1.02 11/04/2018   INR 1.09 04/12/2018   No results found for: PTT  Antibiotics Anti-infectives (From admission, onward)   Start     Dose/Rate Route Frequency Ordered Stop   01/07/19 0000  ceFAZolin (ANCEF) IVPB 2g/100 mL premix     2 g 200 mL/hr over 30 Minutes Intravenous Every 8 hours 01/06/19 2025 01/07/19 1513   01/06/19 0642  ceFAZolin (ANCEF) IVPB 2g/100 mL premix     2 g 200 mL/hr over 30 Minutes Intravenous 30 min pre-op 01/06/19 0642 01/06/19 0854       V. Leia Alf, M.D. [facility] [place] Office: [phone] Pager:  [phone]

## 2019-01-08 NOTE — Care Management Important Message (Signed)
Important Message  Patient Details  Name: Carla Dawson MRN: [id number] Date of Birth: Oct 25, 1931   Medicare Important Message Given:  Yes    Natsumi Whitsitt P Desiderio Dolata 01/08/2019, 2:55 PM

## 2019-01-08 NOTE — NC FL2 (Signed)
[place] MEDICAID FL2 LEVEL OF CARE SCREENING TOOL     IDENTIFICATION  Patient Name: Carla Dawson Birthdate: [birth year] Sex: female Admission Date (Current Location): 01/06/2019  [facility] and [place] Number:  Herbalist and Address:  [facility] [place] [address], [place], [place] [address]      Provider Number: [id number]  Attending Physician Name and Address:  Serafina Mitchell, MD  Relative Name and Phone Number:  [name], daughter [phone]    Current Level of Care: Hospital Recommended Level of Care: [place] Prior Approval Number:    Date Approved/Denied:   PASRR Number: [id number]  Discharge Plan: SNF    Current Diagnoses: Patient Active Problem List   Diagnosis Date Noted  . Chronic respiratory failure with hypoxia ([place]) 01/05/2019  . Paroxysmal atrial fibrillation ([place]) 12/21/2018  . SIRS (systemic inflammatory response syndrome) ([place]) 12/06/2018  . Anemia of chronic disease 11/27/2018  . DOE (dyspnea on exertion) 11/25/2018  . PAC (premature atrial contraction) 11/16/2018  . Femoral artery occlusion, right ([place]) 11/04/2018  . Senile ecchymosis 07/20/2018  . S/P femoral-popliteal bypass surgery 04/12/2018  . Claudication of right lower extremity ([place]) 04/09/2018  . Ischemia of extremity 04/09/2018  . COPD exacerbation ([place]) 03/23/2018  . Hyperlipidemia 02/24/2018  . Abnormal CXR 10/26/2016  . COPD  GOLD III  10/07/2016  . Essential hypertension, benign 10/07/2016  . PAD (peripheral artery disease) ([place]) 10/07/2016  . Neuropathy due to peripheral vascular disease ([place]) 10/07/2016    Orientation RESPIRATION BLADDER Height & Weight     Self, Time, Situation, Place  O2(nasal cannula 2L) Continent Weight: 52.3 kg Height:  5\' 1"  (154.9 cm)  BEHAVIORAL SYMPTOMS/MOOD NEUROLOGICAL BOWEL NUTRITION STATUS      Continent Diet(Please see DC Summary)  AMBULATORY STATUS COMMUNICATION OF NEEDS Skin    Limited Assist Verbally Surgical wounds(Closed incision on groin and leg)                       Personal Care Assistance Level of Assistance  Bathing, Feeding, Dressing Bathing Assistance: Limited assistance Feeding assistance: Limited assistance Dressing Assistance: Limited assistance     Functional Limitations Info  Sight, Hearing, Speech Sight Info: Impaired Hearing Info: Adequate Speech Info: Adequate    SPECIAL CARE FACTORS FREQUENCY  PT (By licensed PT), OT (By licensed OT)     PT Frequency: 5x/week OT Frequency: 3x/week            Contractures Contractures Info: Not present    Additional Factors Info  Code Status, Allergies Code Status Info: Full Allergies Info: : Lortab Hydrocodone-acetaminophen, Morphine And Related           Current Medications (01/08/2019):  This is the current hospital active medication list Current Facility-Administered Medications  Medication Dose Route Frequency Provider Last Rate Last Dose  . 0.9 %  sodium chloride infusion  500 mL Intravenous Once PRN Serafina Mitchell, MD      . 0.9 %  sodium chloride infusion   Intravenous Continuous Serafina Mitchell, MD 50 mL/hr at 01/07/19 2026    . alum & mag hydroxide-simeth (MAALOX/MYLANTA) 200-200-20 MG/5ML suspension 15-30 mL  15-30 mL Oral Q2H PRN Serafina Mitchell, MD      . aspirin EC tablet 81 mg  81 mg Oral Daily Serafina Mitchell, MD   81 mg at 01/08/19 1028  . atorvastatin (LIPITOR) tablet 10 mg  10 mg Oral q1800 Serafina Mitchell, MD  10 mg at 01/07/19 1803  . clopidogrel (PLAVIX) tablet 75 mg  75 mg Oral QHS Serafina Mitchell, MD   75 mg at 01/07/19 2220  . dextromethorphan-guaiFENesin (MUCINEX DM) 30-600 MG per 12 hr tablet 1 tablet  1 tablet Oral BID PRN Ulyses Amor, PA-C      . [START ON 01/09/2019] diltiazem (CARDIZEM CD) 24 hr capsule 180 mg  180 mg Oral Daily Serafina Mitchell, MD      . docusate sodium (COLACE) capsule 100 mg  100 mg Oral Daily Serafina Mitchell, MD   100 mg  at 01/08/19 1028  . enoxaparin (LOVENOX) injection 40 mg  40 mg Subcutaneous Q24H Serafina Mitchell, MD   40 mg at 01/07/19 2218  . famotidine (PEPCID) tablet 20 mg  20 mg Oral QHS Serafina Mitchell, MD      . gabapentin (NEURONTIN) capsule 100 mg  100 mg Oral TID Serafina Mitchell, MD   100 mg at 01/08/19 1634  . hydrALAZINE (APRESOLINE) injection 5 mg  5 mg Intravenous Q20 Min PRN Serafina Mitchell, MD      . HYDROmorphone (DILAUDID) injection 0.2 mg  0.2 mg Intravenous Q2H PRN Marty Heck, MD      . labetalol (NORMODYNE,TRANDATE) injection 10 mg  10 mg Intravenous Q10 min PRN Serafina Mitchell, MD      . levalbuterol Penne Lash) nebulizer solution 0.63 mg  0.63 mg Nebulization Q6H PRN Serafina Mitchell, MD      . loratadine (CLARITIN) tablet 10 mg  10 mg Oral Daily Serafina Mitchell, MD   10 mg at 01/08/19 1028  . magnesium sulfate IVPB 2 g 50 mL  2 g Intravenous Daily PRN Serafina Mitchell, MD      . metoprolol tartrate (LOPRESSOR) injection 2-5 mg  2-5 mg Intravenous Q2H PRN Serafina Mitchell, MD      . mometasone-formoterol [facility]) 200-5 MCG/ACT inhaler 2 puff  2 puff Inhalation BID Serafina Mitchell, MD   2 puff at 01/08/19 [phone]  . multivitamin with minerals tablet 1 tablet  1 tablet Oral QHS Serafina Mitchell, MD   1 tablet at 01/07/19 2220  . ondansetron (ZOFRAN) injection 4 mg  4 mg Intravenous Q6H PRN Serafina Mitchell, MD      . oxyCODONE-acetaminophen (PERCOCET/ROXICET) 5-325 MG per tablet 1 tablet  1 tablet Oral Q4H PRN Marty Heck, MD   1 tablet at 01/08/19 1634  . pantoprazole (PROTONIX) EC tablet 40 mg  40 mg Oral Daily Serafina Mitchell, MD   40 mg at 01/08/19 1028  . phenol (CHLORASEPTIC) mouth spray 1 spray  1 spray Mouth/Throat PRN Serafina Mitchell, MD      . potassium chloride SA (K-DUR,KLOR-CON) CR tablet 20-40 mEq  20-40 mEq Oral Daily PRN Serafina Mitchell, MD      . sodium chloride (OCEAN) 0.65 % nasal spray 1 spray  1 spray Each Nare PRN Serafina Mitchell, MD      .  traMADol Veatrice Bourbon) tablet 50 mg  50 mg Oral Q6H PRN Marty Heck, MD   50 mg at 01/06/19 2212  . umeclidinium bromide (INCRUSE ELLIPTA) 62.5 MCG/INH 1 puff  1 puff Inhalation Daily Serafina Mitchell, MD   1 puff at 01/08/19 4008     Discharge Medications: Please see discharge summary for a list of discharge medications.  Relevant Imaging Results:  Relevant Lab Results:   Additional Information SSN: [id number]  [id number]  Benard Halsted, LCSW

## 2019-01-08 NOTE — Discharge Summary (Signed)
[facility] Discharge Summary   Patient ID:  Carla Dawson MRN: [id number] DOB/AGE: September 10, 1931 85 y.o.  Admit date: 01/06/2019 Discharge date: 01/08/2019 Date of Surgery: 01/06/2019 Surgeon: Surgeon(s): Serafina Mitchell, MD  Admission Diagnosis: PAD (peripheral artery disease) [facility]) [I73.9]  Discharge Diagnoses:  PAD (peripheral artery disease) ([place]) [I73.9]  Secondary Diagnoses: Past Medical History:  Diagnosis Date  . Allergy    "maybe seasonal allergies" (07/08/2017)  . Arthritis    "hands" (07/08/2017)  . Chicken pox   . COPD (chronic obstructive pulmonary disease) ([place])   . Dyspnea   . Dysrhythmia    "racing heart" - medication related  . GERD (gastroesophageal reflux disease)   . High cholesterol    "took me off pills ~ 1-2 months ago" (07/08/2017)  . History of blood transfusion 2017   "when I had blood clot in my leg"  . Hypertension   . PAD (peripheral artery disease) ([place])    severe/notes 10/14/2016  . Pneumonia ~ 2015  . PVD (peripheral vascular disease) ([place])     Procedure(s): THROMBECTOMY FEMORAL-POPITEAL ARTERY PATCH ANGIOPLASTY WITH XENOSURE BIOLOGICAL PATCH OF POPITEAL TIBIAL PERITONEAL TRUNK ENDARTECTOMY POPITEAL TIBIAL PERITONEAL TRUNK  Discharged Condition: stable  HPI: This is an 85 year old female whom I met in 2017 for peripheral vascular disease. Previously, in another state, she was dealing with bilateral rest pain. She underwent percutaneous revascularization of her right leg which was complicated by acute occlusion as well as perforation which required cover stenting. This ultimately led to surgical revascularization which appears to be a right femoral to tibioperoneal trunk bypass graft. On the left leg, the patient has undergone multiple percutaneous interventions including atherectomy and angioplasty of the superficial femoral and popliteal artery as well as angioplasty of anterior tibial artery.On 04-12-2018, she  underwent emergent left femoral to popliteal bypass with gortex with VPA of the CFA and Popliteal. She developed worsening claudication symptoms and on 11-03-2018 she had angioplasty of a CFA lesion. She had a dissection and went to the OR the next day for occlusion where endarterectomy was performed. She has a known stenosis in her left groin and is now having wound issues on her left heel.  Left leg claudication she was scheduled for Left common femoral and external iliac artery endarterectomy.     Hospital Course:  [name] is a 85 y.o. female is S/P Left Procedure(s): [place] OF POPITEAL TIBIAL PERITONEAL TRUNK ENDARTECTOMY POPITEAL TIBIAL PERITONEAL TRUNK  Once in the recovery room her bypass occluded.  She was taken back to the OR. Procedure:   #1: Thrombectomy of left femoral-popliteal bypass graft                         #2: Redo exposure of left popliteal and tibioperoneal trunk                         #3: Endarterectomy of left popliteal and tibioperoneal trunk with bovine pericardial patch angioplasty                         #4: Thrombectomy of left posterior tibial artery                         #5: Placement of Praveena wound VAC  Stable doppler P{T/AT Incisional wound vac left groin.  The wound  vac will stay in place 7-10 days depending on the battery life then it can be removed and thrown away.  Post operative ABI left LE 88% with 0.17 TBI.  Pain well controlled and ambulating to the bathroom with assistance.  Blood loss anemia with chronic anemia she received 2 units of PRBC.  HGB 8.4 stable.    Significant Diagnostic Studies: CBC Lab Results  Component Value Date   WBC 9.1 01/08/2019   HGB 8.4 (L) 01/08/2019   HCT 27.7 (L) 01/08/2019   MCV 83.9 01/08/2019   PLT 184 01/08/2019    BMET    Component Value Date/Time   NA 143 01/08/2019 0356   NA 141 04/23/2013   K 3.8  01/08/2019 0356   CL 110 01/08/2019 0356   CO2 28 01/08/2019 0356   GLUCOSE 93 01/08/2019 0356   BUN 10 01/08/2019 0356   BUN 13 04/23/2013   CREATININE 0.64 01/08/2019 0356   CREATININE 0.76 06/20/2017 1534   CALCIUM 8.4 (L) 01/08/2019 0356   GFRNONAA >60 01/08/2019 0356   GFRAA >60 01/08/2019 0356   COAG Lab Results  Component Value Date   INR 1.05 12/31/2018   INR 1.02 11/04/2018   INR 1.09 04/12/2018     Disposition:  Discharge to :Skilled nursing facility  Allergies as of 01/08/2019      Reactions   Lortab [hydrocodone-acetaminophen] Shortness Of Breath, Itching   Morphine And Related Other (See Comments)   Unknown reaction      Medication List    TAKE these medications   aspirin EC 81 MG tablet Take 81 mg by mouth daily.   atorvastatin 10 MG tablet Commonly known as:  LIPITOR Take 1 tablet (10 mg total) by mouth daily at 6 PM.   budesonide-formoterol 160-4.5 MCG/ACT inhaler Commonly known as:  SYMBICORT Inhale 2 puffs into the lungs 2 (two) times daily.   cetirizine 10 MG tablet Commonly known as:  ZYRTEC Take 10 mg by mouth at bedtime.   clopidogrel 75 MG tablet Commonly known as:  PLAVIX Take 75 mg by mouth at bedtime.   DELSYM 30 MG/5ML liquid Generic drug:  dextromethorphan Take 30 mg by mouth 2 (two) times daily as needed for cough.   dextromethorphan-guaiFENesin 30-600 MG 12hr tablet Commonly known as:  MUCINEX DM Take 0.5 tablets by mouth 2 (two) times daily.   diltiazem 180 MG 24 hr capsule Commonly known as:  CARDIZEM CD Take 1 capsule (180 mg total) by mouth daily.   famotidine 20 MG tablet Commonly known as:  PEPCID Take 20 mg by mouth at bedtime.   gabapentin 100 MG capsule Commonly known as:  NEURONTIN Take 1 capsule (100 mg total) by mouth 3 (three) times daily. What changed:    when to take this  reasons to take this   levalbuterol 45 MCG/ACT inhaler Commonly known as:  XOPENEX HFA Inhale 1 puff into the lungs every 4  (four) hours as needed for wheezing. For COPD ICD 10: J44. 9 For A fib ICD: 10 I48. 9   multivitamin with minerals Tabs tablet Take 1 tablet by mouth at bedtime.   omeprazole 20 MG capsule Commonly known as:  PRILOSEC Take 20 mg by mouth daily.   sodium chloride 0.65 % Soln nasal spray Commonly known as:  OCEAN Place 1 spray into both nostrils as needed for congestion.   SPIRIVA RESPIMAT 2.5 MCG/ACT Aers Generic drug:  Tiotropium Bromide Monohydrate INHALE 2 PUFFS INTO THE LUNGS DAILY What changed:  See  the new instructions.   traMADol 50 MG tablet Commonly known as:  ULTRAM Take 1 tablet (50 mg total) by mouth every 6 (six) hours as needed (mild pain).      Verbal and written Discharge instructions given to the patient. Wound care per Discharge AVS Follow-up Information    Serafina Mitchell, MD Follow up in 2 week(s).   Specialties:  Vascular Surgery, Cardiology Why:  office will call Contact information: [address] [place] [address] [phone]           Signed: Roxy Horseman 01/08/2019, 3:22 PM

## 2019-01-08 NOTE — Evaluation (Signed)
Physical Therapy Evaluation Patient Details Name: Carla Dawson MRN: [id number] DOB: 1931-05-07 Today's Date: 01/08/2019   History of Present Illness  Pt is an 85 yo female s/p occluded L femoral bypass graft s/p thrombectomy, L popliteal  graft and L posterior tibial artery bypass graft on 01/06/19. PMHx: HTN, arthritis, PVD, PAD, previous bypass graft in RLE per patient.    Clinical Impression  Pt admitted with above diagnosis. Pt currently with functional limitations due to the deficits listed below (see PT Problem List). On eval, pt required min assist bed mobility, min guard assist transfers and min guard assist ambulation 175 feet with RW. Pt on 2 L O2 throughout session with SpO2 >90%. 2/4 DOE noted.  Pt will benefit from skilled PT to increase their independence and safety with mobility to allow discharge to the venue listed below.       Follow Up Recommendations Home health PT;Supervision/Assistance - 24 hour    Equipment Recommendations  None recommended by PT    Recommendations for Other Services       Precautions / Restrictions Precautions Precautions: Fall;Other (comment) Precaution Comments: wound vac L thigh, watch sats      Mobility  Bed Mobility Overal bed mobility: Needs Assistance Bed Mobility: Sit to Supine       Sit to supine: Min assist;HOB elevated   General bed mobility comments: assist with BLE into bed  Transfers Overall transfer level: Needs assistance Equipment used: Rolling walker (2 wheeled) Transfers: Sit to/from Stand Sit to Stand: Min guard         General transfer comment: min guard for safety  Ambulation/Gait Ambulation/Gait assistance: Min guard Gait Distance (Feet): 175 Feet Assistive device: Rolling walker (2 wheeled) Gait Pattern/deviations: Step-through pattern;Decreased stride length Gait velocity: mildly decreased Gait velocity interpretation: 1.31 - 2.62 ft/sec, indicative of limited community  ambulator General Gait Details: Steady gait. Ambulated on 2 L O2 with SpO2 >90%. 2/4 DOE noted.   Stairs            Wheelchair Mobility    Modified Rankin (Stroke Patients Only)       Balance Overall balance assessment: Mild deficits observed, not formally tested                                           Pertinent Vitals/Pain Pain Assessment: Faces Faces Pain Scale: Hurts little more Pain Location: LLE Pain Descriptors / Indicators: Sore Pain Intervention(s): Monitored during session;Repositioned    Home Living Family/patient expects to be discharged to:: Private residence Living Arrangements: Children Available Help at Discharge: Family;Available 24 hours/day Type of Home: House Home Access: Stairs to enter Entrance Stairs-Rails: Right Entrance Stairs-Number of Steps: 3-4 Home Layout: Multi-level;Full bath on main level;Able to live on main level with bedroom/bathroom Home Equipment: Shower seat - built in;Walker - 4 wheels;Hand held shower head;Wheelchair - [employer];Other (comment)(home O2)      Prior Function Level of Independence: Needs assistance   Gait / Transfers Assistance Needed: mobility with no AD- just watch O2 tubing.  ADL's / Homemaking Assistance Needed: supervision A with ADL; IADL performed for her        Hand Dominance   Dominant Hand: Right    Extremity/Trunk Assessment   Upper Extremity Assessment Upper Extremity Assessment: Generalized weakness    Lower Extremity Assessment Lower Extremity Assessment: Generalized weakness;LLE deficits/detail LLE Deficits / Details: wound vac L  upper thigh    Cervical / Trunk Assessment Cervical / Trunk Assessment: Normal  Communication   Communication: HOH  Cognition Arousal/Alertness: Awake/alert Behavior During Therapy: WFL for tasks assessed/performed Overall Cognitive Status: Within Functional Limits for tasks assessed                                         General Comments      Exercises     Assessment/Plan    PT Assessment Patient needs continued PT services  PT Problem List Decreased strength;Decreased balance;Pain;Decreased mobility;Cardiopulmonary status limiting activity;Decreased activity tolerance       PT Treatment Interventions Functional mobility training;Balance training;Patient/family education;Gait training;Therapeutic activities;Stair training;Therapeutic exercise    PT Goals (Current goals can be found in the Care Plan section)  Acute Rehab PT Goals Patient Stated Goal: home tomorrow PT Goal Formulation: With patient Time For Goal Achievement: 01/15/19 Potential to Achieve Goals: Good    Frequency Min 3X/week   Barriers to discharge        Co-evaluation               AM-PAC PT "6 Clicks" Mobility  Outcome Measure Help needed turning from your back to your side while in a flat bed without using bedrails?: None Help needed moving from lying on your back to sitting on the side of a flat bed without using bedrails?: A Little Help needed moving to and from a bed to a chair (including a wheelchair)?: A Little Help needed standing up from a chair using your arms (e.g., wheelchair or bedside chair)?: A Little Help needed to walk in hospital room?: A Little Help needed climbing 3-5 steps with a railing? : A Little 6 Click Score: 19    End of Session Equipment Utilized During Treatment: Gait belt;Oxygen Activity Tolerance: Patient tolerated treatment well Patient left: in bed;with call bell/phone within reach Nurse Communication: Mobility status PT Visit Diagnosis: Muscle weakness (generalized) (M62.81);Difficulty in walking, not elsewhere classified (R26.2)    Time: 5732-2025 PT Time Calculation (min) (ACUTE ONLY): 17 min   Charges:   PT Evaluation $PT Eval Moderate Complexity: 1 Mod          Lorrin Goodell, PT  Office # [phone] Pager [phone]   Lorriane Shire 01/08/2019, 12:46 PM

## 2019-01-08 NOTE — Clinical Social Work Note (Signed)
Clinical Social Work Assessment  Patient Details  Name: Carla Dawson MRN: [id number] Date of Birth: May 09, 1931  Date of referral:  01/08/19               Reason for consult:  Facility Placement                Permission sought to share information with:  Facility Sport and exercise psychologist, Family Supports Permission granted to share information::  Yes, Verbal Permission Granted  Name::     [place]::  SNFs  Relationship::  daughter  Contact Information:  [phone]  Housing/Transportation Living arrangements for the past 2 months:  Single Family Home Source of Information:  Adult Children Patient Interpreter Needed:  None Criminal Activity/Legal Involvement Pertinent to Current Situation/Hospitalization:  No - Comment as needed Significant Relationships:  Adult Children Lives with:  Adult Children Do you feel safe going back to the place where you live?  No Need for family participation in patient care:  Yes (Comment)  Care giving concerns:  CSW received consult for discharge needs. CSW spoke with patient's siter regarding SNF placement at time of discharge. Patient lives with her daughter and son in [place]. Patient's family expressed they will be traveling and the patient will be home alone.CSW advised the patient of recommendation of PT. Family states they would like for the patient to get stronger and be able to get up and down with out much assistance before returning home.     Social Worker assessment / plan:  CSW spoke with patient's family  Regarding rehab at [facility] before returning home.   Employment status:  Other (Comment) Insurance information:  Medicare PT Recommendations:  Home with [place], 24 Hour Supervision Information / Referral to community resources:  [place]  Patient/Family's Response to care: Patient's family states patient has been to rehab before and the patient improved. Family expressed appreciation for CSW  assistance.  CSW faxed referrals to SNFs.    Patient/Family's Understanding of and Emotional Response to Diagnosis, Current Treatment, and Prognosis: Patient's family expressed understanding of CSW role and discharge process as well as medical condition. No questions/concerns about plan or treatment at this time.   Emotional Assessment Appearance:  Other (Comment Required(unable to access) Attitude/Demeanor/Rapport:  Unable to Assess Affect (typically observed):  Unable to Assess Orientation:  Oriented to Self, Oriented to Place, Oriented to  Time, Oriented to Situation Alcohol / Substance use:  Illicit Drugs Psych involvement (Current and /or in the community):  No (Comment)  Discharge Needs  Concerns to be addressed:  Care Coordination Readmission within the last 30 days:  No Current discharge risk:  Dependent with Mobility Barriers to Discharge:  Continued Medical Work up   [employer], [place] 01/08/2019, 4:42 PM

## 2019-01-08 NOTE — Discharge Instructions (Signed)
 [facility] [place]  Discharge instructions  Lower Extremity Bypass Surgery  Please refer to the following instruction for your post-procedure care. Your surgeon or physician assistant will discuss any changes with you.  Activity  You are encouraged to walk as much as you can. You can slowly return to normal activities during the month after your surgery. Avoid strenuous activity and heavy lifting until your doctor tells you it's OK. Avoid activities such as vacuuming or swinging a golf club. Do not drive until your doctor give the OK and you are no longer taking prescription pain medications. It is also normal to have difficulty with sleep habits, eating and bowel movement after surgery. These will go away with time.  Bathing/Showering  You may shower after you go home. Do not soak in a bathtub, hot tub, or swim until the incision heals completely.  Incision Care  Clean your incision with mild soap and water. Shower every day. Pat the area dry with a clean towel. You do not need a bandage unless otherwise instructed. Do not apply any ointments or creams to your incision. If you have open wounds you will be instructed how to care for them or a visiting nurse may be arranged for you. If you have staples or sutures along your incision they will be removed at your post-op appointment. You may have skin glue on your incision. Do not peel it off. It will come off on its own in about one week. If you have a great deal of moisture in your groin, use a gauze help keep this area dry.  Diet  Resume your normal diet. There are no special food restrictions following this procedure. A low fat/ low cholesterol diet is recommended for all patients with vascular disease. In order to heal from your surgery, it is CRITICAL to get adequate nutrition. Your body requires vitamins, minerals, and protein. Vegetables are the best source of vitamins and minerals. Vegetables also provide the  perfect balance of protein. Processed food has little nutritional value, so try to avoid this.  Medications  Resume taking all your medications unless your doctor or nurse practitioner tells you not to. If your incision is causing pain, you may take over-the-counter pain relievers such as acetaminophen (Tylenol). If you were prescribed a stronger pain medication, please aware these medication can cause nausea and constipation. Prevent nausea by taking the medication with a snack or meal. Avoid constipation by drinking plenty of fluids and eating foods with high amount of fiber, such as fruits, vegetables, and grains. Take Colase 100 mg (an over-the-counter stool softener) twice a day as needed for constipation. Do not take Tylenol if you are taking prescription pain medications.  Follow Up  Our office will schedule a follow up appointment 2-3 weeks following discharge.  Please call us immediately for any of the following conditions  Severe or worsening pain in your legs or feet while at rest or while walking Increase pain, redness, warmth, or drainage (pus) from your incision site(s) Fever of 101 degree or higher The swelling in your leg with the bypass suddenly worsens and becomes more painful than when you were in the hospital If you have been instructed to feel your graft pulse then you should do so every day. If you can no longer feel this pulse, call the office immediately. Not all patients are given this instruction.  Leg swelling is common after leg bypass surgery.  The swelling should improve over a few months   following surgery. To improve the swelling, you may elevate your legs above the level of your heart while you are sitting or resting. Your surgeon or physician assistant may ask you to apply an ACE wrap or wear compression (TED) stockings to help to reduce swelling.  Reduce your risk of vascular disease  Stop smoking. If you would like help call [facility] at 1-800-QUIT-NOW  (1-800-784-8669) or [place] at 336-586-4000.  Manage your cholesterol Maintain a desired weight Control your diabetes weight Control your diabetes Keep your blood pressure down  If you have any questions, please call the office at [phone]   

## 2019-01-09 DIAGNOSIS — Z95828 Presence of other vascular implants and grafts: Secondary | ICD-10-CM | POA: Diagnosis not present

## 2019-01-09 DIAGNOSIS — J441 Chronic obstructive pulmonary disease with (acute) exacerbation: Secondary | ICD-10-CM | POA: Diagnosis not present

## 2019-01-09 DIAGNOSIS — J449 Chronic obstructive pulmonary disease, unspecified: Secondary | ICD-10-CM | POA: Diagnosis not present

## 2019-01-09 DIAGNOSIS — K219 Gastro-esophageal reflux disease without esophagitis: Secondary | ICD-10-CM | POA: Diagnosis not present

## 2019-01-09 DIAGNOSIS — L97229 Non-pressure chronic ulcer of left calf with unspecified severity: Secondary | ICD-10-CM | POA: Diagnosis not present

## 2019-01-09 DIAGNOSIS — M6281 Muscle weakness (generalized): Secondary | ICD-10-CM | POA: Diagnosis not present

## 2019-01-09 DIAGNOSIS — D649 Anemia, unspecified: Secondary | ICD-10-CM | POA: Diagnosis not present

## 2019-01-09 DIAGNOSIS — S31109D Unspecified open wound of abdominal wall, unspecified quadrant without penetration into peritoneal cavity, subsequent encounter: Secondary | ICD-10-CM | POA: Diagnosis not present

## 2019-01-09 DIAGNOSIS — E785 Hyperlipidemia, unspecified: Secondary | ICD-10-CM | POA: Diagnosis not present

## 2019-01-09 DIAGNOSIS — I10 Essential (primary) hypertension: Secondary | ICD-10-CM | POA: Diagnosis not present

## 2019-01-09 DIAGNOSIS — J9601 Acute respiratory failure with hypoxia: Secondary | ICD-10-CM | POA: Diagnosis not present

## 2019-01-09 DIAGNOSIS — I739 Peripheral vascular disease, unspecified: Secondary | ICD-10-CM | POA: Diagnosis not present

## 2019-01-09 DIAGNOSIS — Z862 Personal history of diseases of the blood and blood-forming organs and certain disorders involving the immune mechanism: Secondary | ICD-10-CM | POA: Diagnosis not present

## 2019-01-09 DIAGNOSIS — J9611 Chronic respiratory failure with hypoxia: Secondary | ICD-10-CM | POA: Diagnosis not present

## 2019-01-09 DIAGNOSIS — J189 Pneumonia, unspecified organism: Secondary | ICD-10-CM | POA: Diagnosis not present

## 2019-01-09 DIAGNOSIS — I480 Paroxysmal atrial fibrillation: Secondary | ICD-10-CM | POA: Diagnosis not present

## 2019-01-09 DIAGNOSIS — G63 Polyneuropathy in diseases classified elsewhere: Secondary | ICD-10-CM | POA: Diagnosis not present

## 2019-01-09 DIAGNOSIS — R6510 Systemic inflammatory response syndrome (SIRS) of non-infectious origin without acute organ dysfunction: Secondary | ICD-10-CM | POA: Diagnosis not present

## 2019-01-09 DIAGNOSIS — M1380 Other specified arthritis, unspecified site: Secondary | ICD-10-CM | POA: Diagnosis not present

## 2019-01-09 DIAGNOSIS — R2689 Other abnormalities of gait and mobility: Secondary | ICD-10-CM | POA: Diagnosis not present

## 2019-01-09 LAB — BASIC METABOLIC PANEL
Anion gap: 8 (ref 5–15)
BUN: 11 mg/dL (ref 8–23)
CO2: 28 mmol/L (ref 22–32)
Calcium: 8.3 mg/dL — ABNORMAL LOW (ref 8.9–10.3)
Chloride: 105 mmol/L (ref 98–111)
Creatinine, Ser: 0.66 mg/dL (ref 0.44–1.00)
GFR calc Af Amer: >60 mL/min (ref >60)
GFR calc non Af Amer: >60 mL/min (ref >60)
Glucose, Bld: 86 mg/dL (ref 70–99)
Potassium: 3.7 mmol/L (ref 3.5–5.1)
SODIUM: 141 mmol/L (ref 135–145)

## 2019-01-09 LAB — CBC
HCT: 28.6 % — ABNORMAL LOW (ref 36.0–46.0)
Hemoglobin: 8.6 g/dL — ABNORMAL LOW (ref 12.0–15.0)
MCH: 25.7 pg — ABNORMAL LOW (ref 26.0–34.0)
MCHC: 30.1 g/dL (ref 30.0–36.0)
MCV: 85.4 fL (ref 80.0–100.0)
Platelets: 192 10*3/uL (ref 150–400)
RBC: 3.35 MIL/uL — ABNORMAL LOW (ref 3.87–5.11)
RDW: 15.5 % (ref 11.5–15.5)
WBC: 6.8 10*3/uL (ref 4.0–10.5)
nRBC: 0 % (ref 0.0–0.2)

## 2019-01-09 NOTE — Progress Notes (Signed)
Report called to Ivin Booty, Therapist, sports at [facility].

## 2019-01-09 NOTE — Progress Notes (Signed)
Subjective: Interval History: none.. Comfortable.  Still with mild congestion but feels better regarding her COPD  Objective: Vital signs in last 24 hours: Temp:  [97.7 F (36.5 C)-98.6 F (37 C)] 98.2 F (36.8 C) (02/01 0831) Pulse Rate:  [61-75] 74 (02/01 0831) Resp:  [16-22] 22 (02/01 0831) BP: (134-149)/(50-70) 143/70 (02/01 0831) SpO2:  [95 %-100 %] 99 % (02/01 0831) Weight:  [50.7 kg] 50.7 kg (02/01 0500)  Intake/Output from previous day: 01/31 0701 - 02/01 0700 In: 1084.8 [P.O.:120; I.V.:964.8] Out: 1350 [Urine:1350] Intake/Output this shift: Total I/O In: 180 [P.O.:180] Out: -   Incisional VAC in place.  Left popliteal incision healing.  Lab Results: Recent Labs    01/08/19 0356 01/09/19 0401  WBC 9.1 6.8  HGB 8.4* 8.6*  HCT 27.7* 28.6*  PLT 184 192   BMET Recent Labs    01/08/19 0356 01/09/19 0401  NA 143 141  K 3.8 3.7  CL 110 105  CO2 28 28  GLUCOSE 93 86  BUN 10 11  CREATININE 0.64 0.66  CALCIUM 8.4* 8.3*    Studies/Results: Dg Ang/ext/uni/or Left  Result Date: 01/06/2019 CLINICAL DATA:  History of right iliofemoral endarterectomy EXAM: LEFT ANG/EXT/UNI/ OR CONTRAST:  See procedure report FLUOROSCOPY TIME:  4 minutes 55 seconds COMPARISON:  None available FINDINGS: A series of intraoperative subtracted spot images document antegrade catheter passage via graft to the anastomosis with the popliteal artery below the knee. Stenosis just distal to the anastomosis. Anterior tibial artery occludes in the proximal calf. Subsequent images document foci of extravasation at the level of the tibioperoneal trunk and proximal peroneal artery in the mid calf. Stent placement across the distal anastomosis. IMPRESSION: Left popliteal arterial intervention as above. See procedure report. Electronically Signed   By: Lucrezia Europe M.D.   On: 01/06/2019 16:09   Vas [place] Burnard Bunting With/wo Tbi  Result Date: 01/07/2019 LOWER EXTREMITY DOPPLER STUDY Indications: Peripheral artery  disease, and Post surgery evaluation. High Risk Factors: Hypertension, hyperlipidemia.  Vascular               Thrombectomy femoral-Popliteal artery surgery on Interventions:         01/06/2019. Limitations: IV on site Comparison Study: ABI/TBI on 11/23/2018 ; Arterial duplex on 01/06/2019 Performing Technologist: Rudell Cobb RDMS RVT  Examination Guidelines: A complete evaluation includes at minimum, Doppler waveform signals and systolic blood pressure reading at the level of bilateral brachial, anterior tibial, and posterior tibial arteries, when vessel segments are accessible. Bilateral testing is considered an integral part of a complete examination. Photoelectric Plethysmograph (PPG) waveforms and toe systolic pressure readings are included as required and additional duplex testing as needed. Limited examinations for reoccurring indications may be performed as noted.  ABI Findings: +---------+------------------+-----+-------------------+--------+ Right    Rt Pressure (mmHg)IndexWaveform           Comment  +---------+------------------+-----+-------------------+--------+ Brachial 147                    triphasic                   +---------+------------------+-----+-------------------+--------+ PTA      52                0.35 dampened monophasic         +---------+------------------+-----+-------------------+--------+ DP       66                0.45 dampened monophasic         +---------+------------------+-----+-------------------+--------+  Great Toe                       Absent                      +---------+------------------+-----+-------------------+--------+ +---------+-----------------+-----+----------+--------------------------------+ Left     Lt Pressure      IndexWaveform  Comment                                   (mmHg)                                                           +---------+-----------------+-----+----------+--------------------------------+  Brachial                       triphasic Unable to obtain due to IV on                                             site                             +---------+-----------------+-----+----------+--------------------------------+ PTA      130              0.88 monophasic                                 +---------+-----------------+-----+----------+--------------------------------+ DP       124              0.84 monophasic                                 +---------+-----------------+-----+----------+--------------------------------+ Great Toe25               0.17                                            +---------+-----------------+-----+----------+--------------------------------+ +-------+-----------+-----------+------------+------------+ ABI/TBIToday's ABIToday's TBIPrevious ABIPrevious TBI +-------+-----------+-----------+------------+------------+ Right  0.45                                           +-------+-----------+-----------+------------+------------+ Left   0.88       0.17                                +-------+-----------+-----------+------------+------------+ Right ABIs appear decreased compared to prior study on 11/23/2018. Left ABIs appear increased compared to prior study on 11/23/2018. Bilateral TBI decreased compare to the previous study.  Summary: Right: Resting right ankle-brachial index indicates severe right lower extremity arterial disease. The right toe-brachial index is abnormal. Left: Resting left ankle-brachial index indicates mild left lower extremity arterial disease. The left toe-brachial index is abnormal.  *See table(s)  above for measurements and observations.  Electronically signed by Servando Snare MD on 01/07/2019 at 2:42:39 PM.    Final    Vas [place] Lower Extremity Arterial Duplex  Result Date: 01/06/2019 LOWER EXTREMITY ARTERIAL DUPLEX STUDY Indications: Post op endarerectomy, weak pedal pulses.  Current ABI: N/A Performing  Technologist: June Leap RDMS, RVT  Examination Guidelines: A complete evaluation includes B-mode imaging, spectral Doppler, color Doppler, and power Doppler as needed of all accessible portions of each vessel. Bilateral testing is considered an integral part of a complete examination. Limited examinations for reoccurring indications may be performed as noted.  Right Graft #1: +------------------+--------+--------+--------+--------+                   PSV cm/sStenosisWaveformComments +------------------+--------+--------+--------+--------+ Inflow                                             +------------------+--------+--------+--------+--------+ Prox Anastomosis                                   +------------------+--------+--------+--------+--------+ Proximal Graft                                     +------------------+--------+--------+--------+--------+ Mid Graft                                          +------------------+--------+--------+--------+--------+ Distal Graft                                       +------------------+--------+--------+--------+--------+ Distal Anastomosis                                 +------------------+--------+--------+--------+--------+ Outflow                                            +------------------+--------+--------+--------+--------+  Left Duplex Findings: +-----------+--------+-----+--------+------------------+--------+            PSV cm/sRatioStenosisWaveform          Comments +-----------+--------+-----+--------+------------------+--------+ SFA Mid                 occluded                           +-----------+--------+-----+--------+------------------+--------+ SFA Distal              occluded                           +-----------+--------+-----+--------+------------------+--------+ POP Prox                occluded                            +-----------+--------+-----+--------+------------------+--------+ POP Distal              occluded                           +-----------+--------+-----+--------+------------------+--------+  ATA Distal                      no flow visualized         +-----------+--------+-----+--------+------------------+--------+ PTA Distal 11                   damp monophasic            +-----------+--------+-----+--------+------------------+--------+ PERO Distal                     no flow visualized         +-----------+--------+-----+--------+------------------+--------+ Left proximal thigh not visualized due to drain/ bandages.  Left Graft #1: +--------------------+--------+--------+--------+--------------+                     PSV cm/sStenosisWaveformComments       +--------------------+--------+--------+--------+--------------+ Inflow                                                     +--------------------+--------+--------+--------+--------------+ Proximal Anastomosis                        not visualized +--------------------+--------+--------+--------+--------------+ Proximal Graft              occluded                       +--------------------+--------+--------+--------+--------------+ Mid Graft                   occluded                       +--------------------+--------+--------+--------+--------------+ Distal Graft                occluded                       +--------------------+--------+--------+--------+--------------+ Distal Anastamosis                                         +--------------------+--------+--------+--------+--------------+ Outflow                                                    +--------------------+--------+--------+--------+--------------+  Summary: Left: Occluded femoral bypass.  See table(s) above for measurements and observations. Electronically signed by Monica Martinez MD on 01/06/2019 at 3:59:51 PM.     Final    Anti-infectives: Anti-infectives (From admission, onward)   Start     Dose/Rate Route Frequency Ordered Stop   01/07/19 0000  ceFAZolin (ANCEF) IVPB 2g/100 mL premix     2 g 200 mL/hr over 30 Minutes Intravenous Every 8 hours 01/06/19 2025 01/07/19 1513   01/06/19 0642  ceFAZolin (ANCEF) IVPB 2g/100 mL premix     2 g 200 mL/hr over 30 Minutes Intravenous 30 min pre-op 01/06/19 5361 01/06/19 0854      Assessment/Plan: s/p Procedure(s): THROMBECTOMY FEMORAL-POPITEAL ARTERY (Left) PATCH ANGIOPLASTY WITH XENOSURE BIOLOGICAL PATCH OF POPITEAL TIBIAL PERITONEAL TRUNK (Left) ENDARTECTOMY POPITEAL TIBIAL PERITONEAL TRUNK (Left) For skilled nursing facility today   LOS: 3 days  Carla Dawson 01/09/2019, 9:33 AM

## 2019-01-09 NOTE — Plan of Care (Signed)

## 2019-01-09 NOTE — Progress Notes (Signed)
Pt discharged per MD order. Discharge instructions reviewed with pt's daughter. Questions answered to satisfactions, daughter verbalized understanding. IV and tele removed. Pt escorted to personal vehicle to [facility].

## 2019-01-09 NOTE — Progress Notes (Signed)
Physical Therapy Treatment Patient Details Name: Carla Dawson MRN: [id number] DOB: April 07, 1931 Today's Date: 01/09/2019    History of Present Illness Pt is an 85 yo female s/p occluded L femoral bypass graft s/p thrombectomy, L popliteal  graft and L posterior tibial artery bypass graft on 01/06/19. PMHx: HTN, arthritis, PVD, PAD, previous bypass graft in RLE per patient.    PT Comments    Pt admitted with above diagnosis. Pt currently with functional limitations due to balance ande ndurance deficits. Pt will not have assist at home per family and will need to go to SNF.  Pt agreeable.  Ambulated with min guard assist today and min guard assist for transfers.  Updated frequency and d/c plan as pt now SNF placement.  Will follow acutely.   Pt will benefit from skilled PT to increase their independence and safety with mobility to allow discharge to the venue listed below.     Follow Up Recommendations  Supervision/Assistance - 24 hour;SNF     Equipment Recommendations  None recommended by PT    Recommendations for Other Services       Precautions / Restrictions Precautions Precautions: Fall;Other (comment) Precaution Comments: wound vac L thigh, watch sats Restrictions Weight Bearing Restrictions: No    Mobility  Bed Mobility Overal bed mobility: Needs Assistance Bed Mobility: Sit to Supine;Supine to Sit     Supine to sit: Min guard Sit to supine: HOB elevated;Min guard      Transfers Overall transfer level: Needs assistance Equipment used: Rolling walker (2 wheeled) Transfers: Sit to/from [facility] Sit to Stand: Min guard Stand pivot transfers: Min guard       General transfer comment: min guard for safety.  Pt asked to use 3N1 first before walk.  Also used the 3N1 when they returned to room.    Ambulation/Gait Ambulation/Gait assistance: Min guard Gait Distance (Feet): 200 Feet Assistive device: Rolling walker (2 wheeled) Gait  Pattern/deviations: Step-through pattern;Decreased stride length Gait velocity: mildly decreased Gait velocity interpretation: 1.31 - 2.62 ft/sec, indicative of limited community ambulator General Gait Details: Steady gait. Ambulated on 2 L O2 with SpO2 >90%. 2/4 DOE noted.    Stairs             Wheelchair Mobility    Modified Rankin (Stroke Patients Only)       Balance Overall balance assessment: Mild deficits observed, not formally tested                                          Cognition Arousal/Alertness: Awake/alert Behavior During Therapy: WFL for tasks assessed/performed Overall Cognitive Status: Within Functional Limits for tasks assessed                                        Exercises      General Comments        Pertinent Vitals/Pain Pain Assessment: Faces Faces Pain Scale: Hurts little more Pain Location: LLE Pain Descriptors / Indicators: Sore Pain Intervention(s): Limited activity within patient's tolerance;Monitored during session;Repositioned    Home Living                      Prior Function            PT Goals (current goals can now be  found in the care plan section) Progress towards PT goals: Progressing toward goals    Frequency    Min 2X/week      PT Plan Discharge plan needs to be updated;Frequency needs to be updated    Co-evaluation              AM-PAC PT "6 Clicks" Mobility   Outcome Measure  Help needed turning from your back to your side while in a flat bed without using bedrails?: None Help needed moving from lying on your back to sitting on the side of a flat bed without using bedrails?: A Little Help needed moving to and from a bed to a chair (including a wheelchair)?: A Little Help needed standing up from a chair using your arms (e.g., wheelchair or bedside chair)?: A Little Help needed to walk in hospital room?: A Little Help needed climbing 3-5 steps with a  railing? : A Little 6 Click Score: 19    End of Session Equipment Utilized During Treatment: Gait belt;Oxygen Activity Tolerance: Patient limited by fatigue Patient left: in bed;with call bell/phone within reach;with bed alarm set Nurse Communication: Mobility status PT Visit Diagnosis: Muscle weakness (generalized) (M62.81);Difficulty in walking, not elsewhere classified (R26.2)     Time: 7121-9758 PT Time Calculation (min) (ACUTE ONLY): 24 min  Charges:  $Gait Training: 23-37 mins                     [place] Pager:  [phone]  Office:  [place] 01/09/2019, 1:06 PM

## 2019-01-09 NOTE — Progress Notes (Signed)
Patient is set to discharge to [facility] SNF today. Patient & daughter, Carla Dawson,  aware. Daughter prefers to provide transportation.   Kingsley Spittle, LCSW Clinical Social Worker [phone]

## 2019-01-12 ENCOUNTER — Telehealth: Payer: Self-pay | Admitting: Surgery

## 2019-01-12 DIAGNOSIS — J441 Chronic obstructive pulmonary disease with (acute) exacerbation: Secondary | ICD-10-CM | POA: Diagnosis not present

## 2019-01-12 DIAGNOSIS — J9601 Acute respiratory failure with hypoxia: Secondary | ICD-10-CM | POA: Diagnosis not present

## 2019-01-12 DIAGNOSIS — I10 Essential (primary) hypertension: Secondary | ICD-10-CM | POA: Diagnosis not present

## 2019-01-12 DIAGNOSIS — I739 Peripheral vascular disease, unspecified: Secondary | ICD-10-CM | POA: Diagnosis not present

## 2019-01-12 DIAGNOSIS — S31109D Unspecified open wound of abdominal wall, unspecified quadrant without penetration into peritoneal cavity, subsequent encounter: Secondary | ICD-10-CM | POA: Diagnosis not present

## 2019-01-12 NOTE — Telephone Encounter (Signed)
sch appt lvm 01/25/2019 1045am p/o MD

## 2019-01-12 NOTE — Telephone Encounter (Signed)
-----   Message from Mena Goes, RN sent at 01/09/2019  1:06 PM EST ----- Regarding: 2 weeks with Dr. Trula Slade Postop  ----- Message ----- From: Serafina Mitchell, MD Sent: 01/08/2019  11:15 PM EST To: [facility] Charge Pool  F/u 2 weeks with me

## 2019-01-13 LAB — ALPHA-1 ANTITRYPSIN PHENOTYPE: A-1 Antitrypsin, Ser: 206 mg/dL — ABNORMAL HIGH (ref 83–199)

## 2019-01-13 NOTE — Progress Notes (Signed)
Left detailed msg on machine ok per DPR

## 2019-01-14 ENCOUNTER — Telehealth: Payer: Self-pay

## 2019-01-14 ENCOUNTER — Other Ambulatory Visit: Payer: Self-pay | Admitting: *Deleted

## 2019-01-14 DIAGNOSIS — L97229 Non-pressure chronic ulcer of left calf with unspecified severity: Secondary | ICD-10-CM | POA: Diagnosis not present

## 2019-01-14 NOTE — Patient Outreach (Signed)
[facility]) Care Management  01/14/2019  Carla Dawson 04-06-1931 [id number]   Met with patient and family in room at [facility].  Patient reports that she plans to discharge next Friday.  She lives with her daughter, Carla Dawson and son in Sports coach, Carla Dawson.  She is also active with the Care Connections program.   RNCM reviewed [facility] care management program.  As patient is active with care connections, no [facility] care management needs assessed at this time.  Left a packet for future reference.   Plan to collaborate with [facility] regarding patient.  Will sign off. [name]. [name], MSN, RN, [employer] , [place] [phone]) Business Cell  [phone]) Toll Free Office

## 2019-01-14 NOTE — Telephone Encounter (Signed)
Returned call to patient's daughter. Her mother is in a rehab facility post-op and the daughter stated that her mother was having some swelling in her leg and foot. Patient is doing rehab so instructions were given to make sure the leg is elevated anytime she was not up and moving. If swelling did not get better with elevation to call us back or have the rehab facility call us.

## 2019-01-21 DIAGNOSIS — L97229 Non-pressure chronic ulcer of left calf with unspecified severity: Secondary | ICD-10-CM | POA: Diagnosis not present

## 2019-01-21 DIAGNOSIS — M6281 Muscle weakness (generalized): Secondary | ICD-10-CM | POA: Diagnosis not present

## 2019-01-21 DIAGNOSIS — J9611 Chronic respiratory failure with hypoxia: Secondary | ICD-10-CM | POA: Diagnosis not present

## 2019-01-21 DIAGNOSIS — I10 Essential (primary) hypertension: Secondary | ICD-10-CM | POA: Diagnosis not present

## 2019-01-21 DIAGNOSIS — I739 Peripheral vascular disease, unspecified: Secondary | ICD-10-CM | POA: Diagnosis not present

## 2019-01-21 DIAGNOSIS — J449 Chronic obstructive pulmonary disease, unspecified: Secondary | ICD-10-CM | POA: Diagnosis not present

## 2019-01-25 ENCOUNTER — Encounter: Payer: Self-pay | Admitting: Surgery

## 2019-01-25 ENCOUNTER — Ambulatory Visit (INDEPENDENT_AMBULATORY_CARE_PROVIDER_SITE_OTHER): Payer: Medicare Other | Admitting: Surgery

## 2019-01-25 ENCOUNTER — Other Ambulatory Visit: Payer: Self-pay

## 2019-01-25 VITALS — BP 137/66 | HR 81 | Temp 97.1°F | Resp 14 | Ht 61.0 in | Wt 103.8 lb

## 2019-01-25 DIAGNOSIS — I70213 Atherosclerosis of native arteries of extremities with intermittent claudication, bilateral legs: Secondary | ICD-10-CM

## 2019-01-25 NOTE — Progress Notes (Signed)
Patient name: Carla Dawson MRN: [id number] DOB: [birth year] Sex: female  REASON FOR VISIT:     post op  HISTORY OF PRESENT ILLNESS:   This is an 85 year old female whom I met in 2017 for peripheral vascular disease. Previously, in another state, she was dealing with bilateral rest pain. She underwent percutaneous revascularization of her right leg which was complicated by acute occlusion as well as perforation which required cover stenting. This ultimately led to surgical revascularization which appears to be a right femoral to tibioperoneal trunk bypass graft. On the left leg, the patient has undergone multiple percutaneous interventions including atherectomy and angioplasty of the superficial femoral and popliteal artery as well as angioplasty of anterior tibial artery.On 04-12-2018, she underwent emergent left femoral to popliteal bypass with gortex with VPA of the CFA and Popliteal.  She developed worsening claudication symptoms and on 11-03-2018 she had angioplasty of a CFA lesion.  She had a dissection and went to the OR the next day for occlusion where endarterectomy was performed.  She has a known stenosis in her left groin and is now having wound issues on her left heel.  On 01/06/2019 she went to the operating room for a left iliofemoral endarterectomy with patch angioplasty as well as a stent within her left popliteal artery.  She lost Doppler signals in the recovery room and was taken back to the operating room for thrombectomy of her left femoral-popliteal bypass graft  She continues to be medically managed for hypertension.  She takes a statin for hypercholesterolemia.  She is on dual anti-platelet therapy  CURRENT MEDICATIONS:    Current Outpatient Medications  Medication Sig Dispense Refill  . aspirin EC 81 MG tablet Take 81 mg by mouth daily.    Marland Kitchen atorvastatin (LIPITOR) 10 MG tablet Take 1 tablet (10 mg total) by mouth daily at 6 PM.     . budesonide-formoterol (SYMBICORT) 160-4.5 MCG/ACT inhaler Inhale 2 puffs into the lungs 2 (two) times daily. 1 Inhaler 0  . cetirizine (ZYRTEC) 10 MG tablet Take 10 mg by mouth at bedtime.     . clopidogrel (PLAVIX) 75 MG tablet Take 75 mg by mouth at bedtime.     Marland Kitchen dextromethorphan (DELSYM) 30 MG/5ML liquid Take 30 mg by mouth 2 (two) times daily as needed for cough.    . dextromethorphan-guaiFENesin (MUCINEX DM) 30-600 MG 12hr tablet Take 0.5 tablets by mouth 2 (two) times daily.    Marland Kitchen diltiazem (CARDIZEM CD) 180 MG 24 hr capsule Take 1 capsule (180 mg total) by mouth daily. 90 capsule 2  . famotidine (PEPCID) 20 MG tablet Take 20 mg by mouth at bedtime.    . gabapentin (NEURONTIN) 100 MG capsule Take 1 capsule (100 mg total) by mouth 3 (three) times daily. (Patient taking differently: Take 100 mg by mouth 3 (three) times daily as needed (for legs pain). ) 90 capsule 2  . levalbuterol (XOPENEX HFA) 45 MCG/ACT inhaler Inhale 1 puff into the lungs every 4 (four) hours as needed for wheezing. For COPD ICD 10: J44. 9 For A fib ICD: 10 I48. 9 1 Inhaler 0  . Multiple Vitamin (MULTIVITAMIN WITH MINERALS) TABS tablet Take 1 tablet by mouth at bedtime.     Marland Kitchen omeprazole (PRILOSEC) 20 MG capsule Take 20 mg by mouth daily.    . sodium chloride (OCEAN) 0.65 % SOLN nasal spray Place 1 spray into both nostrils as needed for congestion. 1 Bottle 0  . SPIRIVA RESPIMAT 2.5 MCG/ACT AERS INHALE  2 PUFFS INTO THE LUNGS DAILY (Patient taking differently: Inhale 2 puffs into the lungs daily. ) 1 Inhaler 11  . traMADol (ULTRAM) 50 MG tablet Take 1 tablet (50 mg total) by mouth every 6 (six) hours as needed (mild pain). 20 tablet 0   No current facility-administered medications for this visit.     REVIEW OF SYSTEMS:   [X]  denotes positive finding, [ ]  denotes negative finding Cardiac  Comments:  Chest pain or chest pressure:    Shortness of breath upon exertion:    Short of breath when lying flat:    Irregular  heart rhythm:    Constitutional    Fever or chills:      PHYSICAL EXAM:   Vitals:   01/25/19 1116  BP: 137/66  Pulse: 81  Resp: 14  Temp: (!) 97.1 F (36.2 C)  TempSrc: Oral  SpO2: 94%  Weight: 103 lb 13.4 oz (47.1 kg)  Height: 5' 1"  (1.549 m)    GENERAL: The patient is a well-nourished female, in no acute distress. The vital signs are documented above. CARDIOVASCULAR: There is a regular rate and rhythm. PULMONARY: Non-labored respirations Brisk left PT signal Mild edema  STUDIES:   none   MEDICAL ISSUES:   F/u 3-4 months with duplex of both legs  [name], MD [facility] [phone] Pager [phone]

## 2019-01-26 DIAGNOSIS — Z7982 Long term (current) use of aspirin: Secondary | ICD-10-CM | POA: Diagnosis not present

## 2019-01-26 DIAGNOSIS — I739 Peripheral vascular disease, unspecified: Secondary | ICD-10-CM | POA: Diagnosis not present

## 2019-01-26 DIAGNOSIS — Z48812 Encounter for surgical aftercare following surgery on the circulatory system: Secondary | ICD-10-CM | POA: Diagnosis not present

## 2019-01-26 DIAGNOSIS — I480 Paroxysmal atrial fibrillation: Secondary | ICD-10-CM | POA: Diagnosis not present

## 2019-01-26 DIAGNOSIS — I10 Essential (primary) hypertension: Secondary | ICD-10-CM | POA: Diagnosis not present

## 2019-01-26 DIAGNOSIS — Z7902 Long term (current) use of antithrombotics/antiplatelets: Secondary | ICD-10-CM | POA: Diagnosis not present

## 2019-01-26 DIAGNOSIS — Z95828 Presence of other vascular implants and grafts: Secondary | ICD-10-CM | POA: Diagnosis not present

## 2019-01-26 DIAGNOSIS — J449 Chronic obstructive pulmonary disease, unspecified: Secondary | ICD-10-CM | POA: Diagnosis not present

## 2019-01-26 DIAGNOSIS — Z7951 Long term (current) use of inhaled steroids: Secondary | ICD-10-CM | POA: Diagnosis not present

## 2019-01-27 ENCOUNTER — Other Ambulatory Visit: Payer: Self-pay

## 2019-01-27 DIAGNOSIS — Z48812 Encounter for surgical aftercare following surgery on the circulatory system: Secondary | ICD-10-CM | POA: Diagnosis not present

## 2019-01-27 DIAGNOSIS — Z95828 Presence of other vascular implants and grafts: Secondary | ICD-10-CM | POA: Diagnosis not present

## 2019-01-27 DIAGNOSIS — J449 Chronic obstructive pulmonary disease, unspecified: Secondary | ICD-10-CM | POA: Diagnosis not present

## 2019-01-27 DIAGNOSIS — I739 Peripheral vascular disease, unspecified: Secondary | ICD-10-CM | POA: Diagnosis not present

## 2019-01-27 DIAGNOSIS — I10 Essential (primary) hypertension: Secondary | ICD-10-CM | POA: Diagnosis not present

## 2019-01-27 DIAGNOSIS — I480 Paroxysmal atrial fibrillation: Secondary | ICD-10-CM | POA: Diagnosis not present

## 2019-01-27 NOTE — Patient Outreach (Signed)
[facility]) Care Management  01/27/2019  Nayelli Inglis [birth year] [id number]   EMMI- General Discharge RED ON EMMI ALERT Day # 4 Date: 01/26/2019 Red Alert Reason:  Sad/hopeless/anxious/empty? Yes  Other questions/problems? Yes    Outreach attempt: spoke with daughter [name].  Discussed reason for call.  She states that the answers did not record correctly.  She says every time she said no it recorded yes.  She states that patient is doing well and that she is part of the care connections program and that patient enjoyed her stay at [facility].  She declines any needs at this time.    Plan: RN CM will close case.   Jone Baseman, RN, MSN [facility] Care Management Care Management Coordinator Direct Line [phone] Toll Free: [phone]  Fax: [phone]

## 2019-01-29 ENCOUNTER — Telehealth: Payer: Self-pay | Admitting: Family Medicine

## 2019-01-29 DIAGNOSIS — I739 Peripheral vascular disease, unspecified: Secondary | ICD-10-CM | POA: Diagnosis not present

## 2019-01-29 DIAGNOSIS — Z48812 Encounter for surgical aftercare following surgery on the circulatory system: Secondary | ICD-10-CM | POA: Diagnosis not present

## 2019-01-29 DIAGNOSIS — J449 Chronic obstructive pulmonary disease, unspecified: Secondary | ICD-10-CM | POA: Diagnosis not present

## 2019-01-29 DIAGNOSIS — I10 Essential (primary) hypertension: Secondary | ICD-10-CM | POA: Diagnosis not present

## 2019-01-29 DIAGNOSIS — Z95828 Presence of other vascular implants and grafts: Secondary | ICD-10-CM | POA: Diagnosis not present

## 2019-01-29 DIAGNOSIS — I480 Paroxysmal atrial fibrillation: Secondary | ICD-10-CM | POA: Diagnosis not present

## 2019-01-29 NOTE — Telephone Encounter (Signed)
Copied from [place] [id number]. Topic: Quick Communication - Home Health Verbal Orders >> Jan 29, 2019  1:59 PM Lennox Solders wrote: Caller/Agency:elizabeth [facility]Callback Number: [phone] Requesting verbal order for 2 physical therapy appt for this patient

## 2019-01-29 NOTE — Telephone Encounter (Signed)
Spoke with Benjamine Mola with [facility] and gave verbal orders as requested.

## 2019-01-29 NOTE — Telephone Encounter (Signed)
Left message to return call to clinic for verbal orders. 

## 2019-02-03 ENCOUNTER — Other Ambulatory Visit: Payer: Self-pay | Admitting: Internal Medicine

## 2019-02-03 ENCOUNTER — Ambulatory Visit: Payer: Medicare Other | Admitting: Internal Medicine

## 2019-02-03 ENCOUNTER — Telehealth: Payer: Self-pay | Admitting: Internal Medicine

## 2019-02-03 DIAGNOSIS — I10 Essential (primary) hypertension: Secondary | ICD-10-CM | POA: Diagnosis not present

## 2019-02-03 DIAGNOSIS — Z95828 Presence of other vascular implants and grafts: Secondary | ICD-10-CM | POA: Diagnosis not present

## 2019-02-03 DIAGNOSIS — I739 Peripheral vascular disease, unspecified: Secondary | ICD-10-CM | POA: Diagnosis not present

## 2019-02-03 DIAGNOSIS — I480 Paroxysmal atrial fibrillation: Secondary | ICD-10-CM | POA: Diagnosis not present

## 2019-02-03 DIAGNOSIS — Z48812 Encounter for surgical aftercare following surgery on the circulatory system: Secondary | ICD-10-CM | POA: Diagnosis not present

## 2019-02-03 DIAGNOSIS — J449 Chronic obstructive pulmonary disease, unspecified: Secondary | ICD-10-CM | POA: Diagnosis not present

## 2019-02-03 MED ORDER — PREDNISONE 10 MG PO TABS: ORAL_TABLET | ORAL | 0 refills | Status: DC

## 2019-02-03 NOTE — Telephone Encounter (Signed)
Called and spoke with Patients Daughter, Carla Dawson.  She stated that Patient is starting to have some cough and has taken her prednisone.  Carla Dawson stated that they are going to [place] and are requesting a Prednisone refill to take with them to take as prescribed.  She stated that last refill was for 100 pills, and she has 25 left at this time. Prednisone is not on Patient med list.  Last OV  01/04/19- Instructions   02 is 3lpm at bedtime and adjust with activity to keep it above 90%    Plan A = Automatic = continue  symbicort 160 x 2 puffs then spiriva 2 pffs and 12 hours later just the symbicort 160 x 2 pffs  Work on inhaler technique:  relax and gently blow all the way out then take a nice smooth deep breath back in, triggering the inhaler at same time you start breathing in.  Hold for up to 5 seconds if you can. Blow out thru nose. Rinse and gargle with water when done       Plan B = Backup Only use your levoalbuterol (xopenex) as a rescue medication to be used if you can't catch your breath by resting or doing a relaxed purse lip breathing pattern.  - The less you use it, the better it will work when you need it. - Ok to use the inhaler up to 2 puffs  every 4 hours if you must but call for appointment if use goes up over your usual need - Don't leave home without it !!  (think of it like the spare tire for your car)   Plan C = Crisis - only use your albuterol nebulizer if you first try Plan B and it fails to help > ok to use the nebulizer up to every 4 hours but if start needing it regularly call for immediate appointment   Plan D = Deltasone = prednisone 10 mg 2 daily until all better then 1 daily x 3 days and stop    For cough mucinex dm 1200 mg every 12 hours with the flutter valve  Try prilosec otc 20mg   Take 30-60 min before first meal of the day and Pepcid ac (famotidine) 20 mg one @  bedtime until cough is completely gone for at least a week without the need for cough  suppression   Please remember to go to the lab department   for your tests - we will call you with the results when they are available.       Please schedule a follow up office visit in 4 weeks, call sooner if needed with all medications /inhalers/ solutions in hand so we can verify exactly what you are taking. This includes all medications from all doctors and over the [place] separate them into two bags:  the ones you take automatically, no matter what, vs the ones you take just when you feel you need them "BAG #2 is UP TO YOU"  - this will really help [place] help you take your medications more effectively.      Message routed to Dr Melvyn Novas

## 2019-02-03 NOTE — Telephone Encounter (Signed)
Called and left message on Patients daughter, [name], [place].  Prednisone prescription sent to requested pharmacy per Dr Gustavus Bryant recommendations. Nothing further at this time.

## 2019-02-03 NOTE — Telephone Encounter (Signed)
Looks like it was taken off as it is on Plan D of action plan on AVS  and should be refilled as listed for 100 pills same sig

## 2019-02-04 DIAGNOSIS — J449 Chronic obstructive pulmonary disease, unspecified: Secondary | ICD-10-CM | POA: Diagnosis not present

## 2019-02-04 DIAGNOSIS — Z48812 Encounter for surgical aftercare following surgery on the circulatory system: Secondary | ICD-10-CM | POA: Diagnosis not present

## 2019-02-04 DIAGNOSIS — I739 Peripheral vascular disease, unspecified: Secondary | ICD-10-CM | POA: Diagnosis not present

## 2019-02-04 DIAGNOSIS — Z95828 Presence of other vascular implants and grafts: Secondary | ICD-10-CM | POA: Diagnosis not present

## 2019-02-04 DIAGNOSIS — I10 Essential (primary) hypertension: Secondary | ICD-10-CM | POA: Diagnosis not present

## 2019-02-04 DIAGNOSIS — I480 Paroxysmal atrial fibrillation: Secondary | ICD-10-CM | POA: Diagnosis not present

## 2019-02-08 ENCOUNTER — Ambulatory Visit: Payer: Medicare Other | Admitting: Family Medicine

## 2019-02-19 ENCOUNTER — Telehealth: Payer: Self-pay | Admitting: *Deleted

## 2019-02-19 NOTE — Telephone Encounter (Signed)
Call from patient's daughter. Carla Dawson is in [place] and informed daughter that she has 3-5 area on her right foot that has "busted open".Patietn is treating them with an ointment and dressings. Daughter is not with patient and has not seen the areas. I instructed her that she will have to judge it they can wait till her mother returns or see medical provider ASAP.  She wants to get patient here next week to be seen. Returning to [facility] Monday night.  Appointment (first available) 02/24/2019 with PA given.

## 2019-02-24 ENCOUNTER — Ambulatory Visit: Payer: Medicare Other

## 2019-02-24 ENCOUNTER — Ambulatory Visit: Payer: Medicare Other | Admitting: Family Medicine

## 2019-02-24 ENCOUNTER — Ambulatory Visit: Payer: Medicare Other | Admitting: Cardiology

## 2019-02-26 ENCOUNTER — Ambulatory Visit: Payer: Medicare Other | Admitting: Cardiology

## 2019-02-28 ENCOUNTER — Other Ambulatory Visit: Payer: Self-pay | Admitting: Family Medicine

## 2019-02-28 DIAGNOSIS — E785 Hyperlipidemia, unspecified: Secondary | ICD-10-CM

## 2019-02-28 DIAGNOSIS — I739 Peripheral vascular disease, unspecified: Secondary | ICD-10-CM

## 2019-03-01 ENCOUNTER — Ambulatory Visit: Payer: Medicare Other | Admitting: Internal Medicine

## 2019-03-02 ENCOUNTER — Telehealth: Payer: Self-pay | Admitting: Internal Medicine

## 2019-03-02 MED ORDER — LEVALBUTEROL TARTRATE 45 MCG/ACT IN AERO: 1.0000 | INHALATION_SPRAY | RESPIRATORY_TRACT | 2 refills | Status: AC | PRN

## 2019-03-02 NOTE — Telephone Encounter (Signed)
Returned call to patient's daughter. There were no refills on Xopenex inhaler when changed from ProAir due to AFib. Refills sent to pharmacy of choice. Per 01/04/19 office visit patient is to use only as rescue and if needed more needs to call office for appt. Nothing further needed.

## 2019-03-03 ENCOUNTER — Telehealth: Payer: Self-pay | Admitting: *Deleted

## 2019-03-03 ENCOUNTER — Telehealth: Payer: Self-pay | Admitting: Internal Medicine

## 2019-03-03 NOTE — Telephone Encounter (Signed)
Medication name and strength: Levabuterol HFA Inh 200PF 15mg  Provider: [facility] Pharmacy: [facility] [place] Patient insurance [id number]  Was the PA started on CMM?  submitted today 03/03/19 If yes, please enter the Key: [id number]  Timeframe for approval/denial: 5-7 business days for determination   Routing to [place] to f/u and review.

## 2019-03-03 NOTE — Telephone Encounter (Signed)
Call from patient's daughter/caregiver. C/0 worsening wounds/ blisters on feet after trip out of state (see previous note). They regretfully cancelled last weeks appointment due to fear of exposure. Appointment given to see Dr. Oneida Alar on 03/04/2019. Discussed many options to reduce risk of exposure and not to come at all if fever or symptoms.

## 2019-03-04 ENCOUNTER — Ambulatory Visit (HOSPITAL_COMMUNITY)
Admission: RE | Admit: 2019-03-04 | Discharge: 2019-03-04 | Disposition: A | Discharge status: Home / Self Care | Payer: Medicare Other | Source: Ambulatory Visit | Attending: Vascular Surgery | Admitting: Vascular Surgery

## 2019-03-04 ENCOUNTER — Other Ambulatory Visit: Payer: Self-pay | Admitting: *Deleted

## 2019-03-04 ENCOUNTER — Encounter: Payer: Self-pay | Admitting: Vascular Surgery

## 2019-03-04 ENCOUNTER — Ambulatory Visit (INDEPENDENT_AMBULATORY_CARE_PROVIDER_SITE_OTHER): Payer: Medicare Other | Admitting: Vascular Surgery

## 2019-03-04 ENCOUNTER — Ambulatory Visit (INDEPENDENT_AMBULATORY_CARE_PROVIDER_SITE_OTHER)
Admission: RE | Admit: 2019-03-04 | Discharge: 2019-03-04 | Disposition: A | Discharge status: Home / Self Care | Payer: Medicare Other | Source: Ambulatory Visit | Attending: Vascular Surgery | Admitting: Vascular Surgery

## 2019-03-04 ENCOUNTER — Encounter: Payer: Self-pay | Admitting: *Deleted

## 2019-03-04 ENCOUNTER — Other Ambulatory Visit: Payer: Self-pay

## 2019-03-04 ENCOUNTER — Other Ambulatory Visit: Payer: Self-pay | Admitting: Physician Assistant

## 2019-03-04 VITALS — BP 137/54 | HR 72 | Temp 98.1°F | Resp 20 | Ht 61.0 in | Wt 103.0 lb

## 2019-03-04 DIAGNOSIS — I739 Peripheral vascular disease, unspecified: Secondary | ICD-10-CM | POA: Insufficient documentation

## 2019-03-04 DIAGNOSIS — I70213 Atherosclerosis of native arteries of extremities with intermittent claudication, bilateral legs: Secondary | ICD-10-CM

## 2019-03-04 NOTE — Telephone Encounter (Signed)
Patient was approved for the levabuterol Hfa. Patient and Pharmacy is aware nothing further is needed at this time.

## 2019-03-04 NOTE — Progress Notes (Signed)
Correction Daughter informed to have patient at [facility] admitting at 6:30 am on 03/10/2019.

## 2019-03-04 NOTE — H&P (View-Only) (Signed)
Patient name: Carla Dawson MRN: [id number] DOB: 23-Apr-1931 Sex: female  REASON FOR CONSULT: Nonhealing wound right foot  HPI: Carla Dawson is a 85 y.o. female, prior history of multiple interventions in her right lower extremity with multiple stents.  Most recent procedure was femoral endarterectomy with right femoral tibioperoneal trunk bypass by Dr. Trula Slade.  This was in 2017.  Patient has noticed recently that she has had more pain in her right foot.  She has also noted color changes.  She also has noted ulcerations on the top of her foot which have failed to heal.  She was last seen by Dr. Trula Slade in February 2020 and was asymptomatic at that point.  She states that currently she is able to walk about 250 steps before her right leg begins to hurt.  She states that prior to this she was able to walk twice as far.  She has no symptoms in her left leg.  She did have a prior left femoral to below-knee popliteal bypass in May 2019.  Other medical problems include COPD, elevated cholesterol, hypertension, all of which have been stable.  He is on aspirin and a statin.  Past Medical History:  Diagnosis Date  . Allergy    "maybe seasonal allergies" (07/08/2017)  . Arthritis    "hands" (07/08/2017)  . Chicken pox   . COPD (chronic obstructive pulmonary disease) ([place])   . Dyspnea   . Dysrhythmia    "racing heart" - medication related  . GERD (gastroesophageal reflux disease)   . High cholesterol    "took me off pills ~ 1-2 months ago" (07/08/2017)  . History of blood transfusion 2017   "when I had blood clot in my leg"  . Hypertension   . PAD (peripheral artery disease) ([place])    severe/notes 10/14/2016  . Pneumonia ~ 2015  . PVD (peripheral vascular disease) ([place])    Past Surgical History:  Procedure Laterality Date  . ABDOMINAL AORTOGRAM N/A 04/22/2017   Procedure: Abdominal Aortogram;  Surgeon: Serafina Mitchell, MD;  Location: [facility];  Service:  Cardiovascular;  Laterality: N/A;  . ABDOMINAL AORTOGRAM W/LOWER EXTREMITY N/A 03/04/2017   Procedure: Abdominal Aortogram w/Lower Extremity;  Surgeon: Serafina Mitchell, MD;  Location: [facility];  Service: Cardiovascular;  Laterality: N/A;  . ABDOMINAL AORTOGRAM W/LOWER EXTREMITY N/A 07/08/2017   Procedure: Abdominal Aortogram w/Lower Extremity;  Surgeon: Serafina Mitchell, MD;  Location: [facility];  Service: Cardiovascular;  Laterality: N/A;  . APPLICATION OF WOUND VAC Left 01/06/2019   Procedure: Application Of Wound Vac;  Surgeon: Serafina Mitchell, MD;  Location: [facility];  Service: Vascular;  Laterality: Left;  . BIOPSY THYROID    . CATARACT EXTRACTION W/ INTRAOCULAR LENS  IMPLANT, BILATERAL Bilateral   . cataracts     bilateral  . EMBOLECTOMY Right 04/09/2018   Procedure: RIGHT COMMON FEMORAL ENDARTERECTOMY  WITH BOVINE PATCH ANGIOPLASTY; RIGHT LOWER LEG THROMBOEMBOLECTOMY INTRAOPERATIVE ARTERIOGRAM;  Surgeon: Waynetta Sandy, MD;  Location: [facility];  Service: Vascular;  Laterality: Right;  . ENDARTERECTOMY FEMORAL Right 11/04/2018   Procedure: ENDARTERECTOMY ILIO-FEMORAL ENDARTECTOMY;  Surgeon: Serafina Mitchell, MD;  Location: [facility];  Service: Vascular;  Laterality: Right;  . ENDARTERECTOMY FEMORAL Left 01/06/2019   Procedure: Redo Left Femoral Artery Endarterectomy;  Surgeon: Serafina Mitchell, MD;  Location: [facility];  Service: Vascular;  Laterality: Left;  . ENDARTERECTOMY POPLITEAL Left 01/06/2019   Procedure: ENDARTECTOMY POPITEAL TIBIAL PERITONEAL TRUNK;  Surgeon: Serafina Mitchell, MD;  Location: [facility] OR;  Service: Vascular;  Laterality: Left;  . EYE SURGERY    . FEMORAL-POPLITEAL BYPASS GRAFT Left 04/12/2018   Procedure: Vein Patch Angioplasty Below Knee Popliteal Artery; Left Femoral Endarterectomy with Profundaplasty; Left Femoral-Below Knee Popliteal Bypass Graft using a 86mm by 80cm Thin Wall Propaten Gortex Graft;  Surgeon: Elam Dutch, MD;  Location: [facility];   Service: Vascular;  Laterality: Left;  . HEMATOMA EVACUATION Right 2017   "S/P procedure; discharged; had to come back for emergency OR"  . INSERTION OF ILIAC STENT Left 01/06/2019   Procedure: Insertion Of Popliteal artery  Viabahn Stent 49mm x 5cm;  Surgeon: Serafina Mitchell, MD;  Location: [facility];  Service: Vascular;  Laterality: Left;  . LOWER EXTREMITY ANGIOGRAM Left 01/06/2019   Procedure: Lower Extremity Angiogram;  Surgeon: Serafina Mitchell, MD;  Location: [facility];  Service: Vascular;  Laterality: Left;  . LOWER EXTREMITY ANGIOGRAPHY Bilateral 04/22/2017   Procedure: Lower Extremity Angiography;  Surgeon: Serafina Mitchell, MD;  Location: [facility];  Service: Cardiovascular;  Laterality: Bilateral;  . LOWER EXTREMITY ANGIOGRAPHY Bilateral 04/07/2018   Procedure: LOWER EXTREMITY ANGIOGRAPHY;  Surgeon: Serafina Mitchell, MD;  Location: [facility];  Service: Cardiovascular;  Laterality: Bilateral;  . LOWER EXTREMITY ANGIOGRAPHY N/A 11/03/2018   Procedure: LOWER EXTREMITY ANGIOGRAPHY;  Surgeon: Serafina Mitchell, MD;  Location: [facility];  Service: Cardiovascular;  Laterality: N/A;  . PATCH ANGIOPLASTY Right 11/04/2018   Procedure: PATCH ANGIOPLASTY WITH Weyman Pedro;  Surgeon: Serafina Mitchell, MD;  Location: [facility] OR;  Service: Vascular;  Laterality: Right;  . PATCH ANGIOPLASTY Left 01/06/2019   Procedure: Patch Angioplasty Left Femoral Artery using Xenosure Biologic 1cm x 6cm Patch;  Surgeon: Serafina Mitchell, MD;  Location: [facility];  Service: Vascular;  Laterality: Left;  . PATCH ANGIOPLASTY Left 01/06/2019   Procedure: [place] PATCH OF POPITEAL TIBIAL PERITONEAL TRUNK;  Surgeon: Serafina Mitchell, MD;  Location: [facility];  Service: Vascular;  Laterality: Left;  . PERIPHERAL VASCULAR BALLOON ANGIOPLASTY Left 03/04/2017   Procedure: Peripheral Vascular Balloon Angioplasty;  Surgeon: Serafina Mitchell, MD;  Location: [place] [facility];  Service:  Cardiovascular;  Laterality: Left;  SFA and PT  . PERIPHERAL VASCULAR BALLOON ANGIOPLASTY Right 11/03/2018   Procedure: PERIPHERAL VASCULAR BALLOON ANGIOPLASTY;  Surgeon: Serafina Mitchell, MD;  Location: [place] [facility];  Service: Cardiovascular;  Laterality: Right;  common femoral  . PERIPHERAL VASCULAR INTERVENTION  07/08/2017  . PERIPHERAL VASCULAR INTERVENTION  07/08/2017   Procedure: Peripheral Vascular Intervention;  Surgeon: Serafina Mitchell, MD;  Location: [place] [facility];  Service: Cardiovascular;;  . PERIPHERAL VASCULAR INTERVENTION Left 04/07/2018   Procedure: PERIPHERAL VASCULAR INTERVENTION;  Surgeon: Serafina Mitchell, MD;  Location: [facility] [facility];  Service: Cardiovascular;  Laterality: Left;  . THROMBECTOMY FEMORAL ARTERY Left 01/06/2019   Procedure: Thrombectomy Left Femoral Artery to Left Popliteal artery;  Surgeon: Serafina Mitchell, MD;  Location: [facility] OR;  Service: Vascular;  Laterality: Left;  . THROMBECTOMY FEMORAL ARTERY Left 01/06/2019   Procedure: THROMBECTOMY FEMORAL-POPITEAL ARTERY;  Surgeon: Serafina Mitchell, MD;  Location: [facility];  Service: Vascular;  Laterality: Left;  . THROMBECTOMY ILIAC ARTERY Right 11/04/2018   Procedure: THROMBECTOMY RIGHT ILIO AND FEMORAL-TIBIAL BYPASS;  Surgeon: Serafina Mitchell, MD;  Location: [facility];  Service: Vascular;  Laterality: Right;  Marland Kitchen VAGINAL HYSTERECTOMY    . VEIN  BYPASS SURGERY      Family History  Problem Relation Age of Onset  . Arthritis Mother   . Hypertension Mother   . Arthritis Father   . Hypertension Father   . Cancer Brother   . Hypertension Son     SOCIAL HISTORY: Social History   Socioeconomic History  . Marital status: Widowed    Spouse name: Not on file  . Number of children: Not on file  . Years of education: Not on file  . Highest education level: Not on file  Occupational History  . Not on file  Social Needs  . Financial resource strain: Not on file  . Food insecurity:    Worry: Patient  refused    Inability: Patient refused  . Transportation needs:    Medical: Patient refused    Non-medical: Patient refused  Tobacco Use  . Smoking status: Former Smoker    Packs/day: 1.50    Years: 52.00    Pack years: 78.00    Start date: 12/09/1953    Last attempt to quit: 12/09/2005    Years since quitting: 13.2  . Smokeless tobacco: Never Used  Substance and Sexual Activity  . Alcohol use: No  . Drug use: No  . Sexual activity: Not Currently  Lifestyle  . Physical activity:    Days per week: Patient refused    Minutes per session: Patient refused  . Stress: Patient refused  Relationships  . Social connections:    Talks on phone: Patient refused    Gets together: Patient refused    Attends religious service: Patient refused    Active member of club or organization: Patient refused    Attends meetings of clubs or organizations: Patient refused    Relationship status: Patient refused  . Intimate partner violence:    Fear of current or ex partner: Patient refused    Emotionally abused: Patient refused    Physically abused: Patient refused    Forced sexual activity: Patient refused  Other Topics Concern  . Not on file  Social History Narrative   n/a    Allergies  Allergen Reactions  . Lortab [Hydrocodone-Acetaminophen] Shortness Of Breath and Itching  . Morphine And Related Other (See Comments)    Unknown reaction    Current Outpatient Medications  Medication Sig Dispense Refill  . aspirin EC 81 MG tablet Take 81 mg by mouth daily.    Marland Kitchen atorvastatin (LIPITOR) 10 MG tablet TAKE 1 TABLET(10 MG) BY MOUTH DAILY (Patient taking differently: Take 10 mg by mouth every evening. ) 90 tablet 2  . budesonide-formoterol (SYMBICORT) 160-4.5 MCG/ACT inhaler Inhale 2 puffs into the lungs 2 (two) times daily. 1 Inhaler 0  . cetirizine (ZYRTEC) 10 MG tablet Take 10 mg by mouth at bedtime.     . clopidogrel (PLAVIX) 75 MG tablet TAKE 1 TABLET(75 MG) BY MOUTH DAILY (Patient taking  differently: Take 75 mg by mouth daily. ) 90 tablet 2  . dextromethorphan (DELSYM) 30 MG/5ML liquid Take 30 mg by mouth 2 (two) times daily as needed for cough.    . dextromethorphan-guaiFENesin (MUCINEX DM) 30-600 MG 12hr tablet Take 0.5 tablets by mouth 2 (two) times daily as needed for cough.     . diltiazem (CARDIZEM CD) 180 MG 24 hr capsule Take 1 capsule (180 mg total) by mouth daily. 90 capsule 2  . famotidine (PEPCID) 20 MG tablet Take 20 mg by mouth daily as needed for heartburn or indigestion.     . gabapentin (NEURONTIN)  100 MG capsule Take 1 capsule (100 mg total) by mouth 3 (three) times daily. (Patient taking differently: Take 100 mg by mouth 3 (three) times daily as needed (for legs pain). ) 90 capsule 2  . levalbuterol (XOPENEX HFA) 45 MCG/ACT inhaler Inhale 1 puff into the lungs every 4 (four) hours as needed for wheezing. For COPD ICD 10: J44. 9 For A fib ICD: 10 I48. 9 1 Inhaler 2  . Multiple Vitamin (MULTIVITAMIN WITH MINERALS) TABS tablet Take 1 tablet by mouth at bedtime.     Marland Kitchen omeprazole (PRILOSEC) 20 MG capsule Take 20 mg by mouth daily as needed (heartburn or indigestion).     . predniSONE (DELTASONE) 10 MG tablet 10mg , 2 daily until all better, then 1 daily x's 5 days, and stop (Patient taking differently: Take 10 mg by mouth daily as needed (inflammation). 10mg , 2 daily until all better, then 1 daily x's 5 days, and stop) 100 tablet 0  . sodium chloride (OCEAN) 0.65 % SOLN nasal spray Place 1 spray into both nostrils as needed for congestion. 1 Bottle 0  . SPIRIVA RESPIMAT 2.5 MCG/ACT AERS INHALE 2 PUFFS INTO THE LUNGS DAILY (Patient taking differently: Inhale 2 puffs into the lungs daily. ) 1 Inhaler 11  . traMADol (ULTRAM) 50 MG tablet Take 1 tablet (50 mg total) by mouth every 6 (six) hours as needed (mild pain). 20 tablet 0  . Vitamins A & D (VITAMIN A & D) ointment Apply 1 application topically as needed for dry skin.     No current facility-administered medications for  this visit.     ROS:   General:  No weight loss, Fever, chills  HEENT: No recent headaches, no nasal bleeding, no visual changes, no sore throat  Neurologic: No dizziness, blackouts, seizures. No recent symptoms of stroke or mini- stroke. No recent episodes of slurred speech, or temporary blindness.  Cardiac: No recent episodes of chest pain/pressure, no shortness of breath at rest.  + shortness of breath with exertion.  Denies history of atrial fibrillation or irregular heartbeat  Vascular: + history of rest pain in feet.  + history of claudication.  + history of non-healing ulcer, No history of DVT   Pulmonary: No home oxygen, no productive cough, no hemoptysis,  No asthma or wheezing  Musculoskeletal:  [ ]  Arthritis, [ ]  Low back pain,  [ ]  Joint pain  Hematologic:No history of hypercoagulable state.  No history of easy bleeding.  No history of anemia  Gastrointestinal: No hematochezia or melena,  No gastroesophageal reflux, no trouble swallowing  Urinary: [ ]  chronic Kidney disease, [ ]  on HD - [ ]  MWF or [ ]  TTHS, [ ]  Burning with urination, [ ]  Frequent urination, [ ]  Difficulty urinating;   Skin: No rashes  Psychological: No history of anxiety,  No history of depression   Physical Examination  Vitals:   03/04/19 0909  BP: (!) 137/54  Pulse: 72  Resp: 20  Temp: 98.1 F (36.7 C)  SpO2: 93%  Weight: 103 lb (46.7 kg)  Height: 5\' 1"  (1.549 m)    Body mass index is 19.46 kg/m.  General:  Alert and oriented, no acute distress HEENT: Normal Neck: No bruit or JVD Pulmonary: Clear to auscultation bilaterally Cardiac: Regular Rate and Rhythm without murmur Abdomen: Soft, non-tender, non-distended, no mass Skin: No rash, right foot dusky scattered ulcerations dorsal aspect of all 5 toes right foot Extremity Pulses:  2+ radial, brachial, femoral, absent popliteal dorsalis pedis, posterior  tibial pulses bilaterally Musculoskeletal: No deformity or edema  Neurologic:  Upper and lower extremity motor 5/5 and symmetric  DATA:  Patient had a right lower extremity duplex exam today which showed the bypass was patent but suggestion of inflow and outflow disease.  ABI on the right side was 0.  Left side was 0.7.  ASSESSMENT: Progressive worsening of right lower extremity peripheral arterial disease now with limb threatening ischemia.  Bypass appears patent by duplex exam but may have progressed tibial disease   PLAN: Abdominal aortogram lower extremity runoff possible intervention by Dr. Trula Slade next week.  Risk benefits possible complications of procedure details were explained to the patient and her daughter today.  They understand agreed to proceed.   Ruta Hinds, MD [facility] Office: [phone] Pager: [phone]

## 2019-03-04 NOTE — Progress Notes (Signed)
Patient name: Carla Dawson MRN: [id number] DOB: 07/13/1931 Sex: female  REASON FOR CONSULT: Nonhealing wound right foot  HPI: Carla Dawson is a 85 y.o. female, prior history of multiple interventions in her right lower extremity with multiple stents.  Most recent procedure was femoral endarterectomy with right femoral tibioperoneal trunk bypass by Dr. Trula Slade.  This was in 2017.  Patient has noticed recently that she has had more pain in her right foot.  She has also noted color changes.  She also has noted ulcerations on the top of her foot which have failed to heal.  She was last seen by Dr. Trula Slade in February 2020 and was asymptomatic at that point.  She states that currently she is able to walk about 250 steps before her right leg begins to hurt.  She states that prior to this she was able to walk twice as far.  She has no symptoms in her left leg.  She did have a prior left femoral to below-knee popliteal bypass in May 2019.  Other medical problems include COPD, elevated cholesterol, hypertension, all of which have been stable.  He is on aspirin and a statin.  Past Medical History:  Diagnosis Date  . Allergy    "maybe seasonal allergies" (07/08/2017)  . Arthritis    "hands" (07/08/2017)  . Chicken pox   . COPD (chronic obstructive pulmonary disease) ([place])   . Dyspnea   . Dysrhythmia    "racing heart" - medication related  . GERD (gastroesophageal reflux disease)   . High cholesterol    "took me off pills ~ 1-2 months ago" (07/08/2017)  . History of blood transfusion 2017   "when I had blood clot in my leg"  . Hypertension   . PAD (peripheral artery disease) ([place])    severe/notes 10/14/2016  . Pneumonia ~ 2015  . PVD (peripheral vascular disease) ([place])    Past Surgical History:  Procedure Laterality Date  . ABDOMINAL AORTOGRAM N/A 04/22/2017   Procedure: Abdominal Aortogram;  Surgeon: Serafina Mitchell, MD;  Location: [place] [facility];  Service:  Cardiovascular;  Laterality: N/A;  . ABDOMINAL AORTOGRAM W/LOWER EXTREMITY N/A 03/04/2017   Procedure: Abdominal Aortogram w/Lower Extremity;  Surgeon: Serafina Mitchell, MD;  Location: [place] [facility];  Service: Cardiovascular;  Laterality: N/A;  . ABDOMINAL AORTOGRAM W/LOWER EXTREMITY N/A 07/08/2017   Procedure: Abdominal Aortogram w/Lower Extremity;  Surgeon: Serafina Mitchell, MD;  Location: [place] [facility];  Service: Cardiovascular;  Laterality: N/A;  . APPLICATION OF WOUND VAC Left 01/06/2019   Procedure: Application Of Wound Vac;  Surgeon: Serafina Mitchell, MD;  Location: [facility];  Service: Vascular;  Laterality: Left;  . BIOPSY THYROID    . CATARACT EXTRACTION W/ INTRAOCULAR LENS  IMPLANT, BILATERAL Bilateral   . cataracts     bilateral  . EMBOLECTOMY Right 04/09/2018   Procedure: RIGHT COMMON FEMORAL ENDARTERECTOMY  WITH BOVINE PATCH ANGIOPLASTY; RIGHT LOWER LEG THROMBOEMBOLECTOMY INTRAOPERATIVE ARTERIOGRAM;  Surgeon: Waynetta Sandy, MD;  Location: [place];  Service: Vascular;  Laterality: Right;  . ENDARTERECTOMY FEMORAL Right 11/04/2018   Procedure: ENDARTERECTOMY ILIO-FEMORAL ENDARTECTOMY;  Surgeon: Serafina Mitchell, MD;  Location: [place];  Service: Vascular;  Laterality: Right;  . ENDARTERECTOMY FEMORAL Left 01/06/2019   Procedure: Redo Left Femoral Artery Endarterectomy;  Surgeon: Serafina Mitchell, MD;  Location: [place];  Service: Vascular;  Laterality: Left;  . ENDARTERECTOMY POPLITEAL Left 01/06/2019   Procedure: ENDARTECTOMY POPITEAL TIBIAL PERITONEAL TRUNK;  Surgeon: Serafina Mitchell, MD;  Location: [facility] OR;  Service: Vascular;  Laterality: Left;  . EYE SURGERY    . FEMORAL-POPLITEAL BYPASS GRAFT Left 04/12/2018   Procedure: Vein Patch Angioplasty Below Knee Popliteal Artery; Left Femoral Endarterectomy with Profundaplasty; Left Femoral-Below Knee Popliteal Bypass Graft using a 17mm by 80cm Thin Wall Propaten Gortex Graft;  Surgeon: Elam Dutch, MD;  Location: [place];   Service: Vascular;  Laterality: Left;  . HEMATOMA EVACUATION Right 2017   "S/P procedure; discharged; had to come back for emergency OR"  . INSERTION OF ILIAC STENT Left 01/06/2019   Procedure: Insertion Of Popliteal artery  Viabahn Stent 51mm x 5cm;  Surgeon: Serafina Mitchell, MD;  Location: [facility];  Service: Vascular;  Laterality: Left;  . LOWER EXTREMITY ANGIOGRAM Left 01/06/2019   Procedure: Lower Extremity Angiogram;  Surgeon: Serafina Mitchell, MD;  Location: [place];  Service: Vascular;  Laterality: Left;  . LOWER EXTREMITY ANGIOGRAPHY Bilateral 04/22/2017   Procedure: Lower Extremity Angiography;  Surgeon: Serafina Mitchell, MD;  Location: [place] [facility];  Service: Cardiovascular;  Laterality: Bilateral;  . LOWER EXTREMITY ANGIOGRAPHY Bilateral 04/07/2018   Procedure: LOWER EXTREMITY ANGIOGRAPHY;  Surgeon: Serafina Mitchell, MD;  Location: [place] [facility];  Service: Cardiovascular;  Laterality: Bilateral;  . LOWER EXTREMITY ANGIOGRAPHY N/A 11/03/2018   Procedure: LOWER EXTREMITY ANGIOGRAPHY;  Surgeon: Serafina Mitchell, MD;  Location: [place] [facility];  Service: Cardiovascular;  Laterality: N/A;  . PATCH ANGIOPLASTY Right 11/04/2018   Procedure: PATCH ANGIOPLASTY WITH Weyman Pedro;  Surgeon: Serafina Mitchell, MD;  Location: [facility] OR;  Service: Vascular;  Laterality: Right;  . PATCH ANGIOPLASTY Left 01/06/2019   Procedure: Patch Angioplasty Left Femoral Artery using Xenosure Biologic 1cm x 6cm Patch;  Surgeon: Serafina Mitchell, MD;  Location: [place];  Service: Vascular;  Laterality: Left;  . PATCH ANGIOPLASTY Left 01/06/2019   Procedure: Person PATCH OF POPITEAL TIBIAL PERITONEAL TRUNK;  Surgeon: Serafina Mitchell, MD;  Location: [place];  Service: Vascular;  Laterality: Left;  . PERIPHERAL VASCULAR BALLOON ANGIOPLASTY Left 03/04/2017   Procedure: Peripheral Vascular Balloon Angioplasty;  Surgeon: Serafina Mitchell, MD;  Location: [place] [facility];  Service:  Cardiovascular;  Laterality: Left;  SFA and PT  . PERIPHERAL VASCULAR BALLOON ANGIOPLASTY Right 11/03/2018   Procedure: PERIPHERAL VASCULAR BALLOON ANGIOPLASTY;  Surgeon: Serafina Mitchell, MD;  Location: [place] [facility];  Service: Cardiovascular;  Laterality: Right;  common femoral  . PERIPHERAL VASCULAR INTERVENTION  07/08/2017  . PERIPHERAL VASCULAR INTERVENTION  07/08/2017   Procedure: Peripheral Vascular Intervention;  Surgeon: Serafina Mitchell, MD;  Location: [place] [facility];  Service: Cardiovascular;;  . PERIPHERAL VASCULAR INTERVENTION Left 04/07/2018   Procedure: PERIPHERAL VASCULAR INTERVENTION;  Surgeon: Serafina Mitchell, MD;  Location: [place] [facility];  Service: Cardiovascular;  Laterality: Left;  . THROMBECTOMY FEMORAL ARTERY Left 01/06/2019   Procedure: Thrombectomy Left Femoral Artery to Left Popliteal artery;  Surgeon: Serafina Mitchell, MD;  Location: [facility] OR;  Service: Vascular;  Laterality: Left;  . THROMBECTOMY FEMORAL ARTERY Left 01/06/2019   Procedure: THROMBECTOMY FEMORAL-POPITEAL ARTERY;  Surgeon: Serafina Mitchell, MD;  Location: [place];  Service: Vascular;  Laterality: Left;  . THROMBECTOMY ILIAC ARTERY Right 11/04/2018   Procedure: THROMBECTOMY RIGHT ILIO AND FEMORAL-TIBIAL BYPASS;  Surgeon: Serafina Mitchell, MD;  Location: [facility];  Service: Vascular;  Laterality: Right;  Marland Kitchen VAGINAL HYSTERECTOMY    . VEIN  BYPASS SURGERY      Family History  Problem Relation Age of Onset  . Arthritis Mother   . Hypertension Mother   . Arthritis Father   . Hypertension Father   . Cancer Brother   . Hypertension Son     SOCIAL HISTORY: Social History   Socioeconomic History  . Marital status: Widowed    Spouse name: Not on file  . Number of children: Not on file  . Years of education: Not on file  . Highest education level: Not on file  Occupational History  . Not on file  Social Needs  . Financial resource strain: Not on file  . Food insecurity:    Worry: Patient  refused    Inability: Patient refused  . Transportation needs:    Medical: Patient refused    Non-medical: Patient refused  Tobacco Use  . Smoking status: Former Smoker    Packs/day: 1.50    Years: 52.00    Pack years: 78.00    Start date: 12/09/1953    Last attempt to quit: 12/09/2005    Years since quitting: 13.2  . Smokeless tobacco: Never Used  Substance and Sexual Activity  . Alcohol use: No  . Drug use: No  . Sexual activity: Not Currently  Lifestyle  . Physical activity:    Days per week: Patient refused    Minutes per session: Patient refused  . Stress: Patient refused  Relationships  . Social connections:    Talks on phone: Patient refused    Gets together: Patient refused    Attends religious service: Patient refused    Active member of club or organization: Patient refused    Attends meetings of clubs or organizations: Patient refused    Relationship status: Patient refused  . Intimate partner violence:    Fear of current or ex partner: Patient refused    Emotionally abused: Patient refused    Physically abused: Patient refused    Forced sexual activity: Patient refused  Other Topics Concern  . Not on file  Social History Narrative   n/a    Allergies  Allergen Reactions  . Lortab [Hydrocodone-Acetaminophen] Shortness Of Breath and Itching  . Morphine And Related Other (See Comments)    Unknown reaction    Current Outpatient Medications  Medication Sig Dispense Refill  . aspirin EC 81 MG tablet Take 81 mg by mouth daily.    Marland Kitchen atorvastatin (LIPITOR) 10 MG tablet TAKE 1 TABLET(10 MG) BY MOUTH DAILY (Patient taking differently: Take 10 mg by mouth every evening. ) 90 tablet 2  . budesonide-formoterol (SYMBICORT) 160-4.5 MCG/ACT inhaler Inhale 2 puffs into the lungs 2 (two) times daily. 1 Inhaler 0  . cetirizine (ZYRTEC) 10 MG tablet Take 10 mg by mouth at bedtime.     . clopidogrel (PLAVIX) 75 MG tablet TAKE 1 TABLET(75 MG) BY MOUTH DAILY (Patient taking  differently: Take 75 mg by mouth daily. ) 90 tablet 2  . dextromethorphan (DELSYM) 30 MG/5ML liquid Take 30 mg by mouth 2 (two) times daily as needed for cough.    . dextromethorphan-guaiFENesin (MUCINEX DM) 30-600 MG 12hr tablet Take 0.5 tablets by mouth 2 (two) times daily as needed for cough.     . diltiazem (CARDIZEM CD) 180 MG 24 hr capsule Take 1 capsule (180 mg total) by mouth daily. 90 capsule 2  . famotidine (PEPCID) 20 MG tablet Take 20 mg by mouth daily as needed for heartburn or indigestion.     . gabapentin (NEURONTIN)  100 MG capsule Take 1 capsule (100 mg total) by mouth 3 (three) times daily. (Patient taking differently: Take 100 mg by mouth 3 (three) times daily as needed (for legs pain). ) 90 capsule 2  . levalbuterol (XOPENEX HFA) 45 MCG/ACT inhaler Inhale 1 puff into the lungs every 4 (four) hours as needed for wheezing. For COPD ICD 10: J44. 9 For A fib ICD: 10 I48. 9 1 Inhaler 2  . Multiple Vitamin (MULTIVITAMIN WITH MINERALS) TABS tablet Take 1 tablet by mouth at bedtime.     Marland Kitchen omeprazole (PRILOSEC) 20 MG capsule Take 20 mg by mouth daily as needed (heartburn or indigestion).     . predniSONE (DELTASONE) 10 MG tablet 10mg , 2 daily until all better, then 1 daily x's 5 days, and stop (Patient taking differently: Take 10 mg by mouth daily as needed (inflammation). 10mg , 2 daily until all better, then 1 daily x's 5 days, and stop) 100 tablet 0  . sodium chloride (OCEAN) 0.65 % SOLN nasal spray Place 1 spray into both nostrils as needed for congestion. 1 Bottle 0  . SPIRIVA RESPIMAT 2.5 MCG/ACT AERS INHALE 2 PUFFS INTO THE LUNGS DAILY (Patient taking differently: Inhale 2 puffs into the lungs daily. ) 1 Inhaler 11  . traMADol (ULTRAM) 50 MG tablet Take 1 tablet (50 mg total) by mouth every 6 (six) hours as needed (mild pain). 20 tablet 0  . Vitamins A & D (VITAMIN A & D) ointment Apply 1 application topically as needed for dry skin.     No current facility-administered medications for  this visit.     ROS:   General:  No weight loss, Fever, chills  HEENT: No recent headaches, no nasal bleeding, no visual changes, no sore throat  Neurologic: No dizziness, blackouts, seizures. No recent symptoms of stroke or mini- stroke. No recent episodes of slurred speech, or temporary blindness.  Cardiac: No recent episodes of chest pain/pressure, no shortness of breath at rest.  + shortness of breath with exertion.  Denies history of atrial fibrillation or irregular heartbeat  Vascular: + history of rest pain in feet.  + history of claudication.  + history of non-healing ulcer, No history of DVT   Pulmonary: No home oxygen, no productive cough, no hemoptysis,  No asthma or wheezing  Musculoskeletal:  [ ]  Arthritis, [ ]  Low back pain,  [ ]  Joint pain  Hematologic:No history of hypercoagulable state.  No history of easy bleeding.  No history of anemia  Gastrointestinal: No hematochezia or melena,  No gastroesophageal reflux, no trouble swallowing  Urinary: [ ]  chronic Kidney disease, [ ]  on HD - [ ]  MWF or [ ]  TTHS, [ ]  Burning with urination, [ ]  Frequent urination, [ ]  Difficulty urinating;   Skin: No rashes  Psychological: No history of anxiety,  No history of depression   Physical Examination  Vitals:   03/04/19 0909  BP: (!) 137/54  Pulse: 72  Resp: 20  Temp: 98.1 F (36.7 C)  SpO2: 93%  Weight: 103 lb (46.7 kg)  Height: 5\' 1"  (1.549 m)    Body mass index is 19.46 kg/m.  General:  Alert and oriented, no acute distress HEENT: Normal Neck: No bruit or JVD Pulmonary: Clear to auscultation bilaterally Cardiac: Regular Rate and Rhythm without murmur Abdomen: Soft, non-tender, non-distended, no mass Skin: No rash, right foot dusky scattered ulcerations dorsal aspect of all 5 toes right foot Extremity Pulses:  2+ radial, brachial, femoral, absent popliteal dorsalis pedis, posterior  tibial pulses bilaterally Musculoskeletal: No deformity or edema  Neurologic:  Upper and lower extremity motor 5/5 and symmetric  DATA:  Patient had a right lower extremity duplex exam today which showed the bypass was patent but suggestion of inflow and outflow disease.  ABI on the right side was 0.  Left side was 0.7.  ASSESSMENT: Progressive worsening of right lower extremity peripheral arterial disease now with limb threatening ischemia.  Bypass appears patent by duplex exam but may have progressed tibial disease   PLAN: Abdominal aortogram lower extremity runoff possible intervention by Dr. Trula Slade next week.  Risk benefits possible complications of procedure details were explained to the patient and her daughter today.  They understand agreed to proceed.   Ruta Hinds, MD [facility] [place] Office: [phone] Pager: [phone]

## 2019-03-10 ENCOUNTER — Other Ambulatory Visit: Payer: Self-pay

## 2019-03-10 ENCOUNTER — Ambulatory Visit (HOSPITAL_COMMUNITY)
Admission: RE | Admit: 2019-03-10 | Discharge: 2019-03-10 | Disposition: A | Discharge status: Home / Self Care | Payer: Medicare Other | Attending: Surgery | Admitting: Surgery

## 2019-03-10 ENCOUNTER — Encounter (HOSPITAL_COMMUNITY)
Admission: RE | Disposition: A | Discharge status: Home / Self Care | Payer: Self-pay | Source: Home / Self Care | Attending: Surgery

## 2019-03-10 DIAGNOSIS — I70234 Atherosclerosis of native arteries of right leg with ulceration of heel and midfoot: Secondary | ICD-10-CM | POA: Diagnosis not present

## 2019-03-10 DIAGNOSIS — M1990 Unspecified osteoarthritis, unspecified site: Secondary | ICD-10-CM | POA: Diagnosis not present

## 2019-03-10 DIAGNOSIS — I10 Essential (primary) hypertension: Secondary | ICD-10-CM | POA: Insufficient documentation

## 2019-03-10 DIAGNOSIS — Z95820 Peripheral vascular angioplasty status with implants and grafts: Secondary | ICD-10-CM | POA: Insufficient documentation

## 2019-03-10 DIAGNOSIS — Z7902 Long term (current) use of antithrombotics/antiplatelets: Secondary | ICD-10-CM | POA: Diagnosis not present

## 2019-03-10 DIAGNOSIS — Z79899 Other long term (current) drug therapy: Secondary | ICD-10-CM | POA: Diagnosis not present

## 2019-03-10 DIAGNOSIS — Z87891 Personal history of nicotine dependence: Secondary | ICD-10-CM | POA: Diagnosis not present

## 2019-03-10 DIAGNOSIS — Z8249 Family history of ischemic heart disease and other diseases of the circulatory system: Secondary | ICD-10-CM | POA: Insufficient documentation

## 2019-03-10 DIAGNOSIS — E7800 Pure hypercholesterolemia, unspecified: Secondary | ICD-10-CM | POA: Diagnosis not present

## 2019-03-10 DIAGNOSIS — Z7951 Long term (current) use of inhaled steroids: Secondary | ICD-10-CM | POA: Insufficient documentation

## 2019-03-10 DIAGNOSIS — L97519 Non-pressure chronic ulcer of other part of right foot with unspecified severity: Secondary | ICD-10-CM | POA: Insufficient documentation

## 2019-03-10 DIAGNOSIS — K219 Gastro-esophageal reflux disease without esophagitis: Secondary | ICD-10-CM | POA: Diagnosis not present

## 2019-03-10 DIAGNOSIS — Z8261 Family history of arthritis: Secondary | ICD-10-CM | POA: Diagnosis not present

## 2019-03-10 DIAGNOSIS — Z885 Allergy status to narcotic agent status: Secondary | ICD-10-CM | POA: Insufficient documentation

## 2019-03-10 DIAGNOSIS — Z7982 Long term (current) use of aspirin: Secondary | ICD-10-CM | POA: Diagnosis not present

## 2019-03-10 DIAGNOSIS — I70235 Atherosclerosis of native arteries of right leg with ulceration of other part of foot: Secondary | ICD-10-CM | POA: Insufficient documentation

## 2019-03-10 DIAGNOSIS — Z90710 Acquired absence of both cervix and uterus: Secondary | ICD-10-CM | POA: Diagnosis not present

## 2019-03-10 DIAGNOSIS — J449 Chronic obstructive pulmonary disease, unspecified: Secondary | ICD-10-CM | POA: Diagnosis not present

## 2019-03-10 HISTORY — PX: ABDOMINAL AORTOGRAM W/LOWER EXTREMITY: CATH118223

## 2019-03-10 HISTORY — PX: PERIPHERAL VASCULAR BALLOON ANGIOPLASTY: CATH118281

## 2019-03-10 LAB — POCT I-STAT 4, (NA,K, GLUC, HGB,HCT)
Glucose, Bld: 78 mg/dL (ref 70–99)
HCT: 36 % (ref 36.0–46.0)
Hemoglobin: 12.2 g/dL (ref 12.0–15.0)
Potassium: 3.6 mmol/L (ref 3.5–5.1)
Sodium: 138 mmol/L (ref 135–145)

## 2019-03-10 LAB — POCT ACTIVATED CLOTTING TIME: Activated Clotting Time: 197 seconds

## 2019-03-10 LAB — POCT I-STAT CREATININE: Creatinine, Ser: 0.8 mg/dL (ref 0.44–1.00)

## 2019-03-10 SURGERY — ABDOMINAL AORTOGRAM W/LOWER EXTREMITY
Anesthesia: LOCAL | Laterality: Bilateral

## 2019-03-10 MED ORDER — LABETALOL HCL 5 MG/ML IV SOLN: 10.0000 mg | INTRAVENOUS | Status: DC | PRN

## 2019-03-10 MED ORDER — HYDRALAZINE HCL 20 MG/ML IJ SOLN: 5.0000 mg | INTRAMUSCULAR | Status: DC | PRN

## 2019-03-10 MED ORDER — HEPARIN SODIUM (PORCINE) 1000 UNIT/ML IJ SOLN
INTRAMUSCULAR | Status: DC | PRN
  Administered 2019-03-10: 5000 [IU] via INTRAVENOUS
  Administered 2019-03-10: 1000 [IU] via INTRAVENOUS

## 2019-03-10 MED ORDER — FENTANYL CITRATE (PF) 100 MCG/2ML IJ SOLN
INTRAMUSCULAR | Status: DC | PRN
  Administered 2019-03-10: 25 ug via INTRAVENOUS

## 2019-03-10 MED ORDER — LIDOCAINE HCL (PF) 1 % IJ SOLN
INTRAMUSCULAR | Status: AC
  Filled 2019-03-10: qty 30

## 2019-03-10 MED ORDER — SODIUM CHLORIDE 0.9% FLUSH: 3.0000 mL | Freq: Two times a day (BID) | INTRAVENOUS | Status: DC

## 2019-03-10 MED ORDER — HEPARIN SODIUM (PORCINE) 1000 UNIT/ML IJ SOLN
INTRAMUSCULAR | Status: AC
  Filled 2019-03-10: qty 1

## 2019-03-10 MED ORDER — MIDAZOLAM HCL 2 MG/2ML IJ SOLN
INTRAMUSCULAR | Status: AC
  Filled 2019-03-10: qty 2

## 2019-03-10 MED ORDER — ONDANSETRON HCL 4 MG/2ML IJ SOLN: 4.0000 mg | Freq: Four times a day (QID) | INTRAMUSCULAR | Status: DC | PRN

## 2019-03-10 MED ORDER — LIDOCAINE HCL (PF) 1 % IJ SOLN
INTRAMUSCULAR | Status: DC | PRN
  Administered 2019-03-10: 15 mL

## 2019-03-10 MED ORDER — NITROGLYCERIN 1 MG/10 ML FOR IR/CATH LAB
INTRA_ARTERIAL | Status: DC | PRN
  Administered 2019-03-10: 600 ug via INTRA_ARTERIAL

## 2019-03-10 MED ORDER — MIDAZOLAM HCL 2 MG/2ML IJ SOLN
INTRAMUSCULAR | Status: DC | PRN
  Administered 2019-03-10: 1 mg via INTRAVENOUS

## 2019-03-10 MED ORDER — SODIUM CHLORIDE 0.9% FLUSH: 3.0000 mL | INTRAVENOUS | Status: DC | PRN

## 2019-03-10 MED ORDER — SODIUM CHLORIDE 0.9 % IV SOLN
INTRAVENOUS | Status: DC
  Administered 2019-03-10: 07:00:00 via INTRAVENOUS

## 2019-03-10 MED ORDER — NITROGLYCERIN 1 MG/10 ML FOR IR/CATH LAB
INTRA_ARTERIAL | Status: AC
  Filled 2019-03-10: qty 10

## 2019-03-10 MED ORDER — FENTANYL CITRATE (PF) 100 MCG/2ML IJ SOLN
INTRAMUSCULAR | Status: AC
  Filled 2019-03-10: qty 2

## 2019-03-10 MED ORDER — ASPIRIN EC 81 MG PO TBEC: 81.0000 mg | DELAYED_RELEASE_TABLET | Freq: Every day | ORAL | Status: DC

## 2019-03-10 MED ORDER — HEPARIN (PORCINE) IN NACL 1000-0.9 UT/500ML-% IV SOLN
INTRAVENOUS | Status: AC
  Filled 2019-03-10: qty 1000

## 2019-03-10 MED ORDER — SODIUM CHLORIDE 0.9 % WEIGHT BASED INFUSION: 1.0000 mL/kg/h | INTRAVENOUS | Status: DC

## 2019-03-10 MED ORDER — SODIUM CHLORIDE 0.9 % IV SOLN: 250.0000 mL | INTRAVENOUS | Status: DC | PRN

## 2019-03-10 MED ORDER — IODIXANOL 320 MG/ML IV SOLN
INTRAVENOUS | Status: DC | PRN
  Administered 2019-03-10: 145 mL via INTRA_ARTERIAL

## 2019-03-10 MED ORDER — HEPARIN (PORCINE) IN NACL 1000-0.9 UT/500ML-% IV SOLN
INTRAVENOUS | Status: DC | PRN
  Administered 2019-03-10 (×2): 500 mL

## 2019-03-10 MED ORDER — CLOPIDOGREL BISULFATE 75 MG PO TABS: 75.0000 mg | ORAL_TABLET | Freq: Every day | ORAL | Status: DC

## 2019-03-10 SURGICAL SUPPLY — 18 items
BALL STERLING OTW 2.5X100X150 (BALLOONS) ×1
BALLN STERLING OTW 2.5X100X150 (BALLOONS) ×2
BALLOON STRLNG OTW 2.5X100X150 (BALLOONS) ×2 IMPLANT
CATH OMNI FLUSH 5F 65CM (CATHETERS) ×3 IMPLANT
DEVICE CLOSURE PERCLS PRGLD 6F (VASCULAR PRODUCTS) ×2 IMPLANT
KIT ENCORE 26 ADVANTAGE (KITS) ×3 IMPLANT
KIT MICROPUNCTURE NIT STIFF (SHEATH) ×3 IMPLANT
KIT PV (KITS) ×3 IMPLANT
PERCLOSE PROGLIDE 6F (VASCULAR PRODUCTS) ×3
SHEATH FLEX ANSEL ANG 6F 45CM (SHEATH) ×3 IMPLANT
SHEATH PINNACLE 5F 10CM (SHEATH) ×3 IMPLANT
SHEATH PROBE COVER 6X72 (BAG) ×3 IMPLANT
SHIELD RADPAD SCOOP 12X17 (MISCELLANEOUS) ×3 IMPLANT
SYR MEDRAD MARK V 150ML (SYRINGE) ×3 IMPLANT
TRANSDUCER W/STOPCOCK (MISCELLANEOUS) ×3 IMPLANT
TRAY PV CATH (CUSTOM PROCEDURE TRAY) ×3 IMPLANT
WIRE BENTSON .035X145CM (WIRE) ×3 IMPLANT
WIRE G V18X300CM (WIRE) ×3 IMPLANT

## 2019-03-10 NOTE — Interval H&P Note (Signed)
History and Physical Interval Note:  03/10/2019 7:29 AM  Carla Dawson  has presented today for surgery, with the diagnosis of PVD.  The various methods of treatment have been discussed with the patient and family. After consideration of risks, benefits and other options for treatment, the patient has consented to  Procedure(s): ABDOMINAL AORTOGRAM W/LOWER EXTREMITY (Bilateral) as a surgical intervention.  The patient's history has been reviewed, patient examined, no change in status, stable for surgery.  I have reviewed the patient's chart and labs.  Questions were answered to the patient's satisfaction.     [name]

## 2019-03-10 NOTE — Op Note (Addendum)
Patient name: Carla Dawson MRN: [id number] DOB: 10/28/1931 Sex: female  03/10/2019 Pre-operative Diagnosis: right leg ulcer Post-operative diagnosis:  Same Surgeon:  [name] Procedure Performed:  1.  Ultrasound-guided access, left femoral artery  2.  Abdominal aortogram  3.  Bilateral lower extremity runoff  4.  Angioplasty right peroneal artery  5.  Angioplasty right bypass graft (femoral to TP trunk)  6.  Intra-arterial administration of nitroglycerin  7.  Conscious sedation 64 minutes  8.  Closure device (Perclose)   Indications: Patient has undergone multiple revascularization procedures.  She is now developed a wound on her right foot.  Duplex ultrasound suggested inflow and outflow disease of her bypass graft.  She is here today for further evaluation.  Procedure:  The patient was identified in the holding area and taken to room 8.  The patient was then placed supine on the table and prepped and draped in the usual sterile fashion.  A time out was called.  Conscious sedation was administered with the use of IV fentanyl and Versed under continuous physician and nurse monitoring.  Heart rate, blood pressure, and oxygen saturation were continuously monitored.  Total sedation time was 56minutes.  Ultrasound was used to evaluate the left common femoral artery.  It was patent .  A digital ultrasound image was acquired.  A micropuncture needle was used to access the left common femoral artery under ultrasound guidance.  An 018 wire was advanced without resistance and a micropuncture sheath was placed.  The 018 wire was removed and a benson wire was placed.  The micropuncture sheath was exchanged for a 5 french sheath.  An omniflush catheter was advanced over the wire to the level of L-1.  An abdominal angiogram was obtained.  Next, using the omniflush catheter and a benson wire, the aortic bifurcation was crossed and the catheter was placed into theright external iliac artery and  right runoff was obtained.  left runoff was performed via retrograde sheath injections.  Findings:   Aortogram: No significant renal artery stenosis was identified.  The infrarenal abdominal aorta is widely patent but heavily calcified.  Bilateral common and external iliac arteries are widely patent but calcified and small in caliber.  Right Lower Extremity: Patulous dilatation of the right common femoral artery is noted.  The profundofemoral artery is patent throughout its course.  The superficial femoral artery is occluded.  The femoral TP trunk bypass graft is patent proximally however just below the joint space, it occludes.  There is reconstitution of the peroneal artery in the mid calf which is the only blood flow to the foot.  Left Lower Extremity: Patulous dilatation of the common femoral artery is noted.  The profundofemoral artery is widely patent.  The femoral to below-knee popliteal bypass graft is patent however there is no outflow of the bypass graft.  There does appear to be two-vessel runoff through the posterior tibial and peroneal artery.  Intervention: After above images were acquired the decision made to proceed with intervention.  Over a Bentson wire, a 6 French 45 cm Ansell 1 sheath was inserted.  The patient was fully heparinized.  Using a V-18 wire and a 2.5 x 100 Sterling balloon I was able to cross the occlusion and get the wire into the peroneal artery.  I then performed multiple balloon dilatations of the peroneal artery and the distal portion of the femoral TP trunk bypass graft.  Follow-up imaging showed patent inline flow with no significant residual stenosis.Marland Kitchen  I did give intra-arterial administration of 600 mcg of nitroglycerin to help with spasm.  Once this was completed, I closed the left groin with a Pro-glide.  There were no complications.  Impression:  #1  Occlusion of the distal portion of the right femoral to tibioperoneal trunk bypass graft.  I was able to get wire  access into the peroneal artery and perform balloon angioplasty of the peroneal artery as well as the bypass graft to where I was able to reestablish inline flow without significant stenosis.  #2  The left femoral below-knee popliteal bypass graft is patent however there is occlusion at its distal extent.  The bypass is remaining open via retrograde flow.  There is posterior tibial and peroneal runoff  #3  I discussed with the patient's daughter that her options are very limited moving forward given the multiple intervention she has had previously as well as the size of her vessels.  I am going to see how she does as far as wound healing on the right leg and have her come back in 4-6 weeks at that time we will consider whether or not intervention on the left leg is warranted.  She is at high risk for bilateral amputation   V. [name], M.D., [facility] [place] Office: [phone] Pager:  [phone]

## 2019-03-10 NOTE — Discharge Instructions (Signed)
Femoral Site Care °This sheet gives you information about how to care for yourself after your procedure. Your health care provider may also give you more specific instructions. If you have problems or questions, contact your health care provider. °What can I expect after the procedure? °After the procedure, it is common to have: °· Bruising that usually fades within 1-2 weeks. °· Tenderness at the site. °Follow these instructions at home: °Wound care °· Follow instructions from your health care provider about how to take care of your insertion site. Make sure you: °? Wash your hands with soap and water before you change your bandage (dressing). If soap and water are not available, use hand sanitizer. °? Change your dressing as told by your health care provider. °? Leave stitches (sutures), skin glue, or adhesive strips in place. These skin closures may need to stay in place for 2 weeks or longer. If adhesive strip edges start to loosen and curl up, you may trim the loose edges. Do not remove adhesive strips completely unless your health care provider tells you to do that. °· Do not take baths, swim, or use a hot tub until your health care provider approves. °· You may shower 24-48 hours after the procedure or as told by your health care provider. °? Gently wash the site with plain soap and water. °? Pat the area dry with a clean towel. °? Do not rub the site. This may cause bleeding. °· Do not apply powder or lotion to the site. Keep the site clean and dry. °· Check your femoral site every day for signs of infection. Check for: °? Redness, swelling, or pain. °? Fluid or blood. °? Warmth. °? Pus or a bad smell. °Activity °· For the first 2-3 days after your procedure, or as long as directed: °? Avoid climbing stairs as much as possible. °? Do not squat. °· Do not lift anything that is heavier than 10 lb (4.5 kg), or the limit that you are told, until your health care provider says that it is safe. °· Rest as  directed. °? Avoid sitting for a long time without moving. Get up to take short walks every 1-2 hours. °· Do not drive for 24 hours if you were given a medicine to help you relax (sedative). °General instructions °· Take over-the-counter and prescription medicines only as told by your health care provider. °· Keep all follow-up visits as told by your health care provider. This is important. °Contact a health care provider if you have: °· A fever or chills. °· You have redness, swelling, or pain around your insertion site. °Get help right away if: °· The catheter insertion area swells very fast. °· You pass out. °· You suddenly start to sweat or your skin gets clammy. °· The catheter insertion area is bleeding, and the bleeding does not stop when you hold steady pressure on the area. °· The area near or just beyond the catheter insertion site becomes pale, cool, tingly, or numb. °These symptoms may represent a serious problem that is an emergency. Do not wait to see if the symptoms will go away. Get medical help right away. Call your local emergency services (911 in the [place] Do not drive yourself to the hospital. °Summary °· After the procedure, it is common to have bruising that usually fades within 1-2 weeks. °· Check your femoral site every day for signs of infection. °· Do not lift anything that is heavier than 10 lb (4.5 kg), or the   limit that you are told, until your health care provider says that it is safe. °This information is not intended to replace advice given to you by your health care provider. Make sure you discuss any questions you have with your health care provider. °Document Released: 07/29/2014 Document Revised: 12/08/2017 Document Reviewed: 12/08/2017 °Elsevier Interactive Patient Education © 2019 [employer] ° °

## 2019-03-10 NOTE — Progress Notes (Signed)
Spoke with daughter [name] over telephone and gave discharge instructions. No questions or concerns at this time

## 2019-03-11 ENCOUNTER — Encounter (HOSPITAL_COMMUNITY): Payer: Self-pay | Admitting: Surgery

## 2019-03-12 ENCOUNTER — Telehealth: Payer: Self-pay

## 2019-03-12 NOTE — Telephone Encounter (Signed)
Returned call to patient's daughter. Concerned about increase redness on her mother's big toe. No fever or signs of infection. Explained that sometime after revascularization there can be increased redness and sometimes pain. Instructed to watch but call if redness worsens or any signs of infection appear.

## 2019-03-15 ENCOUNTER — Other Ambulatory Visit: Payer: Self-pay | Admitting: Physician Assistant

## 2019-03-16 ENCOUNTER — Telehealth: Payer: Self-pay | Admitting: *Deleted

## 2019-03-16 ENCOUNTER — Other Ambulatory Visit: Payer: Self-pay | Admitting: *Deleted

## 2019-03-16 DIAGNOSIS — I739 Peripheral vascular disease, unspecified: Secondary | ICD-10-CM

## 2019-03-16 MED ORDER — TRAMADOL HCL 50 MG PO TABS: 50.0000 mg | ORAL_TABLET | Freq: Four times a day (QID) | ORAL | 0 refills | Status: DC | PRN

## 2019-03-16 MED ORDER — CEPHALEXIN 500 MG PO CAPS: 500.0000 mg | ORAL_CAPSULE | Freq: Two times a day (BID) | ORAL | 0 refills | Status: DC

## 2019-03-16 NOTE — Telephone Encounter (Signed)
Patient's daughter called again today. Sending another picture of patient's feet and toes. She states they are some better but she feels she needs and antibiotic and refill on Tramadol. Will show picture and discuss with surgeon in office today.

## 2019-03-22 ENCOUNTER — Ambulatory Visit (INDEPENDENT_AMBULATORY_CARE_PROVIDER_SITE_OTHER): Payer: Self-pay | Admitting: Surgery

## 2019-03-22 ENCOUNTER — Encounter: Payer: Self-pay | Admitting: *Deleted

## 2019-03-22 ENCOUNTER — Other Ambulatory Visit: Payer: Self-pay | Admitting: *Deleted

## 2019-03-22 ENCOUNTER — Other Ambulatory Visit: Payer: Self-pay

## 2019-03-22 ENCOUNTER — Telehealth: Payer: Self-pay | Admitting: *Deleted

## 2019-03-22 ENCOUNTER — Encounter: Payer: Self-pay | Admitting: Surgery

## 2019-03-22 VITALS — BP 147/58 | HR 79 | Temp 98.3°F | Resp 20 | Ht 61.0 in | Wt 106.0 lb

## 2019-03-22 DIAGNOSIS — I70245 Atherosclerosis of native arteries of left leg with ulceration of other part of foot: Secondary | ICD-10-CM

## 2019-03-22 NOTE — H&P (View-Only) (Signed)
[facility] [place]  Patient name: Carla Dawson MRN: [id number] DOB: [birth year] Sex: female   REASON FOR VISIT:    Follow up  [place]:    This is an 85 year old female whom I met in 2017 for peripheral vascular disease. Previously, in another state, she was dealing with bilateral rest pain. She underwent percutaneous revascularization of her right leg which was complicated by acute occlusion as well as perforation which required cover stenting. This ultimately led to surgical revascularization which appears to be a right femoral to tibioperoneal trunk bypass graft. On the left leg, the patient has undergone multiple percutaneous interventions including atherectomy and angioplasty of the superficial femoral and popliteal artery as well as angioplasty of anterior tibial artery.On 04-12-2018, she underwent emergent left femoral to popliteal bypass with gortex with VPA of the CFA and Popliteal. She developed worsening claudication symptoms and on 11-03-2018 she had angioplasty of a CFA lesion. She had a dissection and went to the OR the next day for occlusion where endarterectomy was performed. She has a known stenosis in her left groin and is now having wound issues on her left heel.  On 01/06/2019 she went to the operating room for a left iliofemoral endarterectomy with patch angioplasty as well as a stent within her left popliteal artery.  She lost Doppler signals in the recovery room and was taken back to the operating room for thrombectomy of her left femoral-popliteal bypass graft.  Her most recent angiogram was on 03/10/2019.  This was done for right leg ulcer.  I performed angioplasty of her bypass graft as well as the peroneal artery.  She has had progression of her native vessel disease.  Her bypass graft was nearly occluded.  She continues to be medically managed for hypertension. She takes a statin for  hypercholesterolemia. She is on dual anti-platelet therapy  PAST MEDICAL HISTORY:   Past Medical History:  Diagnosis Date  . Allergy    "maybe seasonal allergies" (07/08/2017)  . Arthritis    "hands" (07/08/2017)  . Chicken pox   . COPD (chronic obstructive pulmonary disease) ([place])   . Dyspnea   . Dysrhythmia    "racing heart" - medication related  . GERD (gastroesophageal reflux disease)   . High cholesterol    "took me off pills ~ 1-2 months ago" (07/08/2017)  . History of blood transfusion 2017   "when I had blood clot in my leg"  . Hypertension   . PAD (peripheral artery disease) ([place])    severe/notes 10/14/2016  . Pneumonia ~ 2015  . PVD (peripheral vascular disease) ([place])      FAMILY HISTORY:   Family History  Problem Relation Age of Onset  . Arthritis Mother   . Hypertension Mother   . Arthritis Father   . Hypertension Father   . Cancer Brother   . Hypertension Son     SOCIAL HISTORY:   Social History   Tobacco Use  . Smoking status: Former Smoker    Packs/day: 1.50    Years: 52.00    Pack years: 78.00    Start date: 12/09/1953    Last attempt to quit: 12/09/2005    Years since quitting: 13.2  . Smokeless tobacco: Never Used  Substance Use Topics  . Alcohol use: No     ALLERGIES:   Allergies  Allergen Reactions  . Lortab [Hydrocodone-Acetaminophen] Shortness Of Breath and Itching  . Morphine And Related Other (See Comments)    Unknown  reaction     CURRENT MEDICATIONS:   Current Outpatient Medications  Medication Sig Dispense Refill  . aspirin EC 81 MG tablet Take 81 mg by mouth daily.    Marland Kitchen atorvastatin (LIPITOR) 10 MG tablet TAKE 1 TABLET(10 MG) BY MOUTH DAILY (Patient taking differently: Take 10 mg by mouth every evening. ) 90 tablet 2  . budesonide-formoterol (SYMBICORT) 160-4.5 MCG/ACT inhaler Inhale 2 puffs into the lungs 2 (two) times daily. 1 Inhaler 0  . cetirizine (ZYRTEC) 10 MG tablet Take 10 mg by mouth at bedtime.     .  clopidogrel (PLAVIX) 75 MG tablet TAKE 1 TABLET(75 MG) BY MOUTH DAILY (Patient taking differently: Take 75 mg by mouth daily. ) 90 tablet 2  . dextromethorphan (DELSYM) 30 MG/5ML liquid Take 30 mg by mouth 2 (two) times daily as needed for cough.    . dextromethorphan-guaiFENesin (MUCINEX DM) 30-600 MG 12hr tablet Take 0.5 tablets by mouth 2 (two) times daily as needed for cough.     . diltiazem (CARDIZEM CD) 180 MG 24 hr capsule Take 1 capsule (180 mg total) by mouth daily. 90 capsule 2  . famotidine (PEPCID) 20 MG tablet Take 20 mg by mouth daily as needed for heartburn or indigestion.     . gabapentin (NEURONTIN) 100 MG capsule Take 1 capsule (100 mg total) by mouth 3 (three) times daily. (Patient taking differently: Take 100 mg by mouth 3 (three) times daily as needed (for legs pain). ) 90 capsule 2  . levalbuterol (XOPENEX HFA) 45 MCG/ACT inhaler Inhale 1 puff into the lungs every 4 (four) hours as needed for wheezing. For COPD ICD 10: J44. 9 For A fib ICD: 10 I48. 9 1 Inhaler 2  . Multiple Vitamin (MULTIVITAMIN WITH MINERALS) TABS tablet Take 1 tablet by mouth at bedtime.     Marland Kitchen omeprazole (PRILOSEC) 20 MG capsule Take 20 mg by mouth daily as needed (heartburn or indigestion).     . predniSONE (DELTASONE) 10 MG tablet 18m, 2 daily until all better, then 1 daily x's 5 days, and stop (Patient taking differently: Take 10 mg by mouth daily as needed (inflammation). 117m 2 daily until all better, then 1 daily x's 5 days, and stop) 100 tablet 0  . sodium chloride (OCEAN) 0.65 % SOLN nasal spray Place 1 spray into both nostrils as needed for congestion. 1 Bottle 0  . SPIRIVA RESPIMAT 2.5 MCG/ACT AERS INHALE 2 PUFFS INTO THE LUNGS DAILY (Patient taking differently: Inhale 2 puffs into the lungs daily. ) 1 Inhaler 11  . traMADol (ULTRAM) 50 MG tablet Take 1 tablet (50 mg total) by mouth every 6 (six) hours as needed for moderate pain. 20 tablet 0  . Vitamins A & D (VITAMIN A & D) ointment Apply 1  application topically as needed for dry skin.     No current facility-administered medications for this visit.     REVIEW OF SYSTEMS:   _0  denotes positive finding, _1  denotes negative finding Cardiac  Comments:  Chest pain or chest pressure:    Shortness of breath upon exertion:    Short of breath when lying flat:    Irregular heart rhythm:        Vascular    Pain in calf, thigh, or hip brought on by ambulation: x   Pain in feet at night that wakes you up from your sleep:  x   Blood clot in your veins:    Leg swelling:  Pulmonary    Oxygen at home:    Productive cough:     Wheezing:         Neurologic    Sudden weakness in arms or legs:     Sudden numbness in arms or legs:     Sudden onset of difficulty speaking or slurred speech:    Temporary loss of vision in one eye:     Problems with dizziness:         Gastrointestinal    Blood in stool:     Vomited blood:         Genitourinary    Burning when urinating:     Blood in urine:        Psychiatric    Major depression:         Hematologic    Bleeding problems:    Problems with blood clotting too easily:        Skin    Rashes or ulcers: x       Constitutional    Fever or chills:      PHYSICAL EXAM:   Vitals:   03/22/19 1135  BP: (!) 147/58  Pulse: 79  Resp: 20  Temp: 98.3 F (36.8 C)  SpO2: 91%  Weight: 48.1 kg  Height: _0  (1.549 m)    GENERAL: The patient is a well-nourished female, in no acute distress. The vital signs are documented above. CARDIAC: There is a regular rate and rhythm.  VASCULAR: pedal pulses not palpable PULMONARY: Non-labored respirations ABDOMEN: Soft and non-tender with normal pitched bowel sounds.  MUSCULOSKELETAL: There are no major deformities or cyanosis. NEUROLOGIC: No focal weakness or paresthesias are detected. SKIN: bilateral skin tears on the bottom of her feet PSYCHIATRIC: The patient has a normal affect.  STUDIES:   none  MEDICAL ISSUES:    We discussed that she is at very high risk for bilateral amputation as she has progressing vascular disease with limited options.  Based on her angio 2 weeks ago, her left fem pop BPG is threatened, and now that she has wounds on her foot, I feel she deserves one last chance at restoring blood flow.  I will schedule her for angiography via a right femoral approach in an effort to re-establish in line blood flow to her left leg.  This will be next week.  I spoke with her daughter to relate the content of our visit, and that we are running out of options other than amputation    [name], IV, MD, FACS [facility] [phone] Pager [phone]

## 2019-03-22 NOTE — Progress Notes (Signed)
[facility] [place]  Patient name: Carla Dawson MRN: [id number] DOB: [birth year] Sex: female   REASON FOR VISIT:    Follow up  [place]:    This is an 85 year old female whom I met in 2017 for peripheral vascular disease. [place], [place] state, she was dealing with bilateral rest pain. She underwent percutaneous revascularization of her right leg which was complicated by acute occlusion as well as perforation which required cover stenting. This ultimately led to surgical revascularization which appears to be a right femoral to tibioperoneal trunk bypass graft. On the left leg, the patient has undergone multiple percutaneous interventions including atherectomy and angioplasty of the superficial femoral and popliteal artery as well as angioplasty of anterior tibial artery.On 04-12-2018, she underwent emergent left femoral to popliteal bypass with gortex with VPA of the CFA and Popliteal. She developed worsening claudication symptoms and on 11-03-2018 she had angioplasty of a CFA lesion. She had a dissection and went to the OR the next day for occlusion where endarterectomy was performed. She has a known stenosis in her left groin and is now having wound issues on her left heel.  On 01/06/2019 she went to the operating room for a left iliofemoral endarterectomy with patch angioplasty as well as a stent within her left popliteal artery.  She lost Doppler signals in the recovery room and was taken back to the operating room for thrombectomy of her left femoral-popliteal bypass graft.  Her most recent angiogram was on 03/10/2019.  This was done for right leg ulcer.  I performed angioplasty of her bypass graft as well as the peroneal artery.  She has had progression of her native vessel disease.  Her bypass graft was nearly occluded.  She continues to be medically managed for hypertension. She takes a statin for  hypercholesterolemia. She is on dual anti-platelet therapy  PAST MEDICAL HISTORY:   Past Medical History:  Diagnosis Date  . Allergy    "maybe seasonal allergies" (07/08/2017)  . Arthritis    "hands" (07/08/2017)  . Chicken pox   . COPD (chronic obstructive pulmonary disease) ([place])   . Dyspnea   . Dysrhythmia    "racing heart" - medication related  . GERD (gastroesophageal reflux disease)   . High cholesterol    "took me off pills ~ 1-2 months ago" (07/08/2017)  . History of blood transfusion 2017   "when I had blood clot in my leg"  . Hypertension   . PAD (peripheral artery disease) ([place])    severe/notes 10/14/2016  . Pneumonia ~ 2015  . PVD (peripheral vascular disease) ([place])      FAMILY HISTORY:   Family History  Problem Relation Age of Onset  . Arthritis Mother   . Hypertension Mother   . Arthritis Father   . Hypertension Father   . Cancer Brother   . Hypertension Son     SOCIAL HISTORY:   Social History   Tobacco Use  . Smoking status: Former Smoker    Packs/day: 1.50    Years: 52.00    Pack years: 78.00    Start date: 12/09/1953    Last attempt to quit: 12/09/2005    Years since quitting: 13.2  . Smokeless tobacco: Never Used  Substance Use Topics  . Alcohol use: No     ALLERGIES:   Allergies  Allergen Reactions  . Lortab [Hydrocodone-Acetaminophen] Shortness Of Breath and Itching  . Morphine And Related Other (See Comments)    Unknown  reaction     CURRENT MEDICATIONS:   Current Outpatient Medications  Medication Sig Dispense Refill  . aspirin EC 81 MG tablet Take 81 mg by mouth daily.    Marland Kitchen atorvastatin (LIPITOR) 10 MG tablet TAKE 1 TABLET(10 MG) BY MOUTH DAILY (Patient taking differently: Take 10 mg by mouth every evening. ) 90 tablet 2  . budesonide-formoterol (SYMBICORT) 160-4.5 MCG/ACT inhaler Inhale 2 puffs into the lungs 2 (two) times daily. 1 Inhaler 0  . cetirizine (ZYRTEC) 10 MG tablet Take 10 mg by mouth at bedtime.     .  clopidogrel (PLAVIX) 75 MG tablet TAKE 1 TABLET(75 MG) BY MOUTH DAILY (Patient taking differently: Take 75 mg by mouth daily. ) 90 tablet 2  . dextromethorphan (DELSYM) 30 MG/5ML liquid Take 30 mg by mouth 2 (two) times daily as needed for cough.    . dextromethorphan-guaiFENesin (MUCINEX DM) 30-600 MG 12hr tablet Take 0.5 tablets by mouth 2 (two) times daily as needed for cough.     . diltiazem (CARDIZEM CD) 180 MG 24 hr capsule Take 1 capsule (180 mg total) by mouth daily. 90 capsule 2  . famotidine (PEPCID) 20 MG tablet Take 20 mg by mouth daily as needed for heartburn or indigestion.     . gabapentin (NEURONTIN) 100 MG capsule Take 1 capsule (100 mg total) by mouth 3 (three) times daily. (Patient taking differently: Take 100 mg by mouth 3 (three) times daily as needed (for legs pain). ) 90 capsule 2  . levalbuterol (XOPENEX HFA) 45 MCG/ACT inhaler Inhale 1 puff into the lungs every 4 (four) hours as needed for wheezing. For COPD ICD 10: J44. 9 For A fib ICD: 10 I48. 9 1 Inhaler 2  . Multiple Vitamin (MULTIVITAMIN WITH MINERALS) TABS tablet Take 1 tablet by mouth at bedtime.     Marland Kitchen omeprazole (PRILOSEC) 20 MG capsule Take 20 mg by mouth daily as needed (heartburn or indigestion).     . predniSONE (DELTASONE) 10 MG tablet 18m, 2 daily until all better, then 1 daily x's 5 days, and stop (Patient taking differently: Take 10 mg by mouth daily as needed (inflammation). 117m 2 daily until all better, then 1 daily x's 5 days, and stop) 100 tablet 0  . sodium chloride (OCEAN) 0.65 % SOLN nasal spray Place 1 spray into both nostrils as needed for congestion. 1 Bottle 0  . SPIRIVA RESPIMAT 2.5 MCG/ACT AERS INHALE 2 PUFFS INTO THE LUNGS DAILY (Patient taking differently: Inhale 2 puffs into the lungs daily. ) 1 Inhaler 11  . traMADol (ULTRAM) 50 MG tablet Take 1 tablet (50 mg total) by mouth every 6 (six) hours as needed for moderate pain. 20 tablet 0  . Vitamins A & D (VITAMIN A & D) ointment Apply 1  application topically as needed for dry skin.     No current facility-administered medications for this visit.     REVIEW OF SYSTEMS:   _0  denotes positive finding, _1  denotes negative finding Cardiac  Comments:  Chest pain or chest pressure:    Shortness of breath upon exertion:    Short of breath when lying flat:    Irregular heart rhythm:        Vascular    Pain in calf, thigh, or hip brought on by ambulation: x   Pain in feet at night that wakes you up from your sleep:  x   Blood clot in your veins:    Leg swelling:  Pulmonary    Oxygen at home:    Productive cough:     Wheezing:         Neurologic    Sudden weakness in arms or legs:     Sudden numbness in arms or legs:     Sudden onset of difficulty speaking or slurred speech:    Temporary loss of vision in one eye:     Problems with dizziness:         Gastrointestinal    Blood in stool:     Vomited blood:         Genitourinary    Burning when urinating:     Blood in urine:        Psychiatric    Major depression:         Hematologic    Bleeding problems:    Problems with blood clotting too easily:        Skin    Rashes or ulcers: x       Constitutional    Fever or chills:      PHYSICAL EXAM:   Vitals:   03/22/19 1135  BP: (!) 147/58  Pulse: 79  Resp: 20  Temp: 98.3 F (36.8 C)  SpO2: 91%  Weight: 48.1 kg  Height: _0  (1.549 m)    GENERAL: The patient is a well-nourished female, in no acute distress. The vital signs are documented above. CARDIAC: There is a regular rate and rhythm.  VASCULAR: pedal pulses not palpable PULMONARY: Non-labored respirations ABDOMEN: Soft and non-tender with normal pitched bowel sounds.  MUSCULOSKELETAL: There are no major deformities or cyanosis. NEUROLOGIC: No focal weakness or paresthesias are detected. SKIN: bilateral skin tears on the bottom of her feet PSYCHIATRIC: The patient has a normal affect.  STUDIES:   none  MEDICAL ISSUES:    We discussed that she is at very high risk for bilateral amputation as she has progressing vascular disease with limited options.  Based on her angio 2 weeks ago, her left fem pop BPG is threatened, and now that she has wounds on her foot, I feel she deserves one last chance at restoring blood flow.  I will schedule her for angiography via a right femoral approach in an effort to re-establish in line blood flow to her left leg.  This will be next week.  I spoke with her daughter to relate the content of our visit, and that we are running out of options other than amputation    [name], IV, MD, FACS [facility] [phone] Pager [phone]

## 2019-03-22 NOTE — Telephone Encounter (Signed)
Call from patient's daughter. States toe wounds and pain is worse in spite of recent antibiotics. Decided to bring patient in to be seen by Dr. Trula Slade today. Have had multiple phone calls and pictures text and shared with Dr. Donzetta Matters and Dr. Donnetta Hutching over the past 2 weeks. Agreeable to 11:30 am appointment.

## 2019-03-23 ENCOUNTER — Other Ambulatory Visit: Payer: Self-pay | Admitting: *Deleted

## 2019-03-23 DIAGNOSIS — I739 Peripheral vascular disease, unspecified: Secondary | ICD-10-CM

## 2019-03-23 MED ORDER — CEPHALEXIN 500 MG PO CAPS: 500.0000 mg | ORAL_CAPSULE | Freq: Three times a day (TID) | ORAL | 0 refills | Status: AC

## 2019-03-30 ENCOUNTER — Ambulatory Visit (HOSPITAL_COMMUNITY)
Admission: RE | Admit: 2019-03-30 | Discharge: 2019-03-30 | Disposition: A | Discharge status: Home / Self Care | Payer: Medicare Other | Attending: Surgery | Admitting: Surgery

## 2019-03-30 ENCOUNTER — Encounter (HOSPITAL_COMMUNITY)
Admission: RE | Disposition: A | Discharge status: Home / Self Care | Payer: Self-pay | Source: Home / Self Care | Attending: Surgery

## 2019-03-30 ENCOUNTER — Other Ambulatory Visit: Payer: Self-pay

## 2019-03-30 DIAGNOSIS — Z885 Allergy status to narcotic agent status: Secondary | ICD-10-CM | POA: Insufficient documentation

## 2019-03-30 DIAGNOSIS — E7800 Pure hypercholesterolemia, unspecified: Secondary | ICD-10-CM | POA: Diagnosis not present

## 2019-03-30 DIAGNOSIS — L97519 Non-pressure chronic ulcer of other part of right foot with unspecified severity: Secondary | ICD-10-CM | POA: Insufficient documentation

## 2019-03-30 DIAGNOSIS — Z8261 Family history of arthritis: Secondary | ICD-10-CM | POA: Insufficient documentation

## 2019-03-30 DIAGNOSIS — Z7902 Long term (current) use of antithrombotics/antiplatelets: Secondary | ICD-10-CM | POA: Insufficient documentation

## 2019-03-30 DIAGNOSIS — Z87891 Personal history of nicotine dependence: Secondary | ICD-10-CM | POA: Diagnosis not present

## 2019-03-30 DIAGNOSIS — I70245 Atherosclerosis of native arteries of left leg with ulceration of other part of foot: Secondary | ICD-10-CM | POA: Insufficient documentation

## 2019-03-30 DIAGNOSIS — I70235 Atherosclerosis of native arteries of right leg with ulceration of other part of foot: Secondary | ICD-10-CM | POA: Insufficient documentation

## 2019-03-30 DIAGNOSIS — Z8249 Family history of ischemic heart disease and other diseases of the circulatory system: Secondary | ICD-10-CM | POA: Insufficient documentation

## 2019-03-30 DIAGNOSIS — L97529 Non-pressure chronic ulcer of other part of left foot with unspecified severity: Secondary | ICD-10-CM | POA: Insufficient documentation

## 2019-03-30 DIAGNOSIS — Z79899 Other long term (current) drug therapy: Secondary | ICD-10-CM | POA: Insufficient documentation

## 2019-03-30 DIAGNOSIS — K219 Gastro-esophageal reflux disease without esophagitis: Secondary | ICD-10-CM | POA: Diagnosis not present

## 2019-03-30 DIAGNOSIS — M1990 Unspecified osteoarthritis, unspecified site: Secondary | ICD-10-CM | POA: Insufficient documentation

## 2019-03-30 DIAGNOSIS — Z7951 Long term (current) use of inhaled steroids: Secondary | ICD-10-CM | POA: Diagnosis not present

## 2019-03-30 DIAGNOSIS — Z7982 Long term (current) use of aspirin: Secondary | ICD-10-CM | POA: Insufficient documentation

## 2019-03-30 DIAGNOSIS — J449 Chronic obstructive pulmonary disease, unspecified: Secondary | ICD-10-CM | POA: Diagnosis not present

## 2019-03-30 DIAGNOSIS — I10 Essential (primary) hypertension: Secondary | ICD-10-CM | POA: Insufficient documentation

## 2019-03-30 HISTORY — PX: PERIPHERAL VASCULAR BALLOON ANGIOPLASTY: CATH118281

## 2019-03-30 HISTORY — PX: ABDOMINAL AORTOGRAM: CATH118222

## 2019-03-30 HISTORY — PX: LOWER EXTREMITY ANGIOGRAPHY: CATH118251

## 2019-03-30 LAB — POCT I-STAT 4, (NA,K, GLUC, HGB,HCT)
Glucose, Bld: 85 mg/dL (ref 70–99)
HCT: 36 % (ref 36.0–46.0)
Hemoglobin: 12.2 g/dL (ref 12.0–15.0)
Potassium: 3.6 mmol/L (ref 3.5–5.1)
Sodium: 138 mmol/L (ref 135–145)

## 2019-03-30 LAB — POCT ACTIVATED CLOTTING TIME
Activated Clotting Time: 224 seconds
Activated Clotting Time: 230 seconds
Activated Clotting Time: 191 seconds
Activated Clotting Time: 169 seconds

## 2019-03-30 LAB — POCT I-STAT CREATININE: Creatinine, Ser: 0.7 mg/dL (ref 0.44–1.00)

## 2019-03-30 SURGERY — LOWER EXTREMITY ANGIOGRAPHY
Anesthesia: LOCAL

## 2019-03-30 MED ORDER — SODIUM CHLORIDE 0.9 % WEIGHT BASED INFUSION: 1.0000 mL/kg/h | INTRAVENOUS | Status: DC

## 2019-03-30 MED ORDER — FENTANYL CITRATE (PF) 100 MCG/2ML IJ SOLN
INTRAMUSCULAR | Status: AC
  Filled 2019-03-30: qty 2

## 2019-03-30 MED ORDER — SODIUM CHLORIDE 0.9% FLUSH: 3.0000 mL | INTRAVENOUS | Status: DC | PRN

## 2019-03-30 MED ORDER — HEPARIN (PORCINE) IN NACL 1000-0.9 UT/500ML-% IV SOLN
INTRAVENOUS | Status: DC | PRN
  Administered 2019-03-30 (×2): 500 mL

## 2019-03-30 MED ORDER — LABETALOL HCL 5 MG/ML IV SOLN: 10.0000 mg | INTRAVENOUS | Status: DC | PRN

## 2019-03-30 MED ORDER — HEPARIN SODIUM (PORCINE) 1000 UNIT/ML IJ SOLN
INTRAMUSCULAR | Status: DC | PRN
  Administered 2019-03-30: 1000 [IU] via INTRAVENOUS
  Administered 2019-03-30: 5000 [IU] via INTRAVENOUS

## 2019-03-30 MED ORDER — HEPARIN (PORCINE) IN NACL 1000-0.9 UT/500ML-% IV SOLN
INTRAVENOUS | Status: AC
  Filled 2019-03-30: qty 500

## 2019-03-30 MED ORDER — LIDOCAINE HCL (PF) 1 % IJ SOLN
INTRAMUSCULAR | Status: DC | PRN
  Administered 2019-03-30: 15 mL

## 2019-03-30 MED ORDER — ONDANSETRON HCL 4 MG/2ML IJ SOLN: 4.0000 mg | Freq: Four times a day (QID) | INTRAMUSCULAR | Status: DC | PRN

## 2019-03-30 MED ORDER — SODIUM CHLORIDE 0.9% FLUSH: 3.0000 mL | Freq: Two times a day (BID) | INTRAVENOUS | Status: DC

## 2019-03-30 MED ORDER — HYDRALAZINE HCL 20 MG/ML IJ SOLN
INTRAMUSCULAR | Status: AC
  Filled 2019-03-30: qty 1

## 2019-03-30 MED ORDER — IODIXANOL 320 MG/ML IV SOLN
INTRAVENOUS | Status: DC | PRN
  Administered 2019-03-30: 09:00:00 135 mL via INTRAVENOUS

## 2019-03-30 MED ORDER — SODIUM CHLORIDE 0.9 % IV SOLN: 250.0000 mL | INTRAVENOUS | Status: DC | PRN

## 2019-03-30 MED ORDER — SODIUM CHLORIDE 0.9 % IV SOLN
INTRAVENOUS | Status: DC
  Administered 2019-03-30: 06:00:00 via INTRAVENOUS

## 2019-03-30 MED ORDER — LIDOCAINE HCL (PF) 1 % IJ SOLN
INTRAMUSCULAR | Status: AC
  Filled 2019-03-30: qty 30

## 2019-03-30 MED ORDER — MIDAZOLAM HCL 2 MG/2ML IJ SOLN
INTRAMUSCULAR | Status: DC | PRN
  Administered 2019-03-30: 1 mg via INTRAVENOUS
  Administered 2019-03-30: 0.5 mg via INTRAVENOUS

## 2019-03-30 MED ORDER — FENTANYL CITRATE (PF) 100 MCG/2ML IJ SOLN
INTRAMUSCULAR | Status: DC | PRN
  Administered 2019-03-30 (×2): 25 ug via INTRAVENOUS

## 2019-03-30 MED ORDER — MIDAZOLAM HCL 2 MG/2ML IJ SOLN
INTRAMUSCULAR | Status: AC
  Filled 2019-03-30: qty 2

## 2019-03-30 MED ORDER — HYDRALAZINE HCL 20 MG/ML IJ SOLN
5.0000 mg | INTRAMUSCULAR | Status: DC | PRN
  Administered 2019-03-30: 5 mg via INTRAVENOUS

## 2019-03-30 SURGICAL SUPPLY — 16 items
BALLN STERLING SL OTW 3X80X150 (BALLOONS) ×4
BALLOON STRLNG SL OTW 3X80X150 (BALLOONS) ×3 IMPLANT
CATH OMNI FLUSH 5F 65CM (CATHETERS) ×4 IMPLANT
CATH QUICKCROSS ANG SELECT (CATHETERS) ×4 IMPLANT
DEVICE CONTINUOUS FLUSH (MISCELLANEOUS) ×4 IMPLANT
KIT ENCORE 26 ADVANTAGE (KITS) ×4 IMPLANT
KIT MICROPUNCTURE NIT STIFF (SHEATH) ×4 IMPLANT
KIT PV (KITS) ×4 IMPLANT
SHEATH PINNACLE 5F 10CM (SHEATH) ×4 IMPLANT
SHEATH PINNACLE ST 6F 45CM (SHEATH) ×4 IMPLANT
SYR MEDRAD MARK V 150ML (SYRINGE) ×4 IMPLANT
TRANSDUCER W/STOPCOCK (MISCELLANEOUS) ×4 IMPLANT
TRAY PV CATH (CUSTOM PROCEDURE TRAY) ×4 IMPLANT
WIRE BENTSON .035X145CM (WIRE) ×4 IMPLANT
WIRE G V18X300CM (WIRE) ×4 IMPLANT
WIRE ROSEN-J .035X180CM (WIRE) ×4 IMPLANT

## 2019-03-30 NOTE — Op Note (Signed)
Patient name: Carla Dawson MRN: [id number] DOB: 10-06-1931 Sex: female  03/30/2019 Pre-operative Diagnosis: Bilateral lower extremity ulcers Post-operative diagnosis:  Same Surgeon:  [name] Procedure Performed:  1.  Ultrasound-guided access, right femoral artery  2.  Abdominal aortogram  3.  Bilateral lower extremity runoff  4.  Angioplasty, left popliteal artery  5.  Angioplasty, left peroneal artery  6.  Conscious sedation (93 minutes)     Indications: The patient has bilateral leg ulcers and has undergone multiple interventions.  She is here as a last ditch effort for limb salvage.  Procedure:  The patient was identified in the holding area and taken to room 8.  The patient was then placed supine on the table and prepped and draped in the usual sterile fashion.  A time out was called.  Conscious sedation was administered with the use of IV fentanyl and Versed under continuous physician and nurse monitoring.  Heart rate, blood pressure, and oxygen saturation were continuously monitored.  Total sedation time was 93 minutes.  Ultrasound was used to evaluate the right common femoral artery.  It was patent .  A digital ultrasound image was acquired.  A micropuncture needle was used to access the right common femoral artery under ultrasound guidance.  An 018 wire was advanced without resistance and a micropuncture sheath was placed.  The 018 wire was removed and a benson wire was placed.  The micropuncture sheath was exchanged for a 5 french sheath.  An omniflush catheter was advanced over the wire to the level of L-1.  An abdominal angiogram was obtained.  Next, using the omniflush catheter and a benson wire, the aortic bifurcation was crossed and the catheter was placed into theleft external iliac artery and left runoff was obtained.  right runoff was performed via retrograde sheath injections.  Findings:   Aortogram: No significant renal artery stenosis.  If the abdominal  aorta is patent but small in caliber.  Bilateral external iliac and common iliac arteries are patent without significant stenosis but very small in caliber.  Right Lower Extremity: Patulous dilatation of the right common femoral artery is noted.  There is sluggish filling of the bypass graft, suggesting recurrent stenosis.  Left Lower Extremity: The left common femoral and profundofemoral artery are patent.  The bypass graft to the below-knee popliteal artery is patent however there is no inline flow.  The bypass graft stays open from retrograde filling of the popliteal artery.  There is delayed reconstitution of the posterior tibial and peroneal artery.  The below-knee popliteal artery is occluded.  Intervention: After the above images were acquired the decision was made to proceed with intervention.  Over a Rosen wire, a 6 French 45 cm sheath was advanced into the bypass graft.  The patient was fully heparinized.  Heparin levels were monitored and redosed appropriately.  I then used a V-18 wire with the support of a 3 x 80 Sterling balloon.  I was able to get the wire and balloon across the distal anastomosis into the peroneal artery.  I then performed balloon angioplasty of the peroneal and below-knee popliteal artery with a 3 mm balloon.  Subsequent imaging revealed a spiral dissection within the below-knee popliteal and peroneal artery.  I then tried to manipulate the wire with the assistance of a quick cross select catheter.  I had no control or ability to torque the wire or catheter because of the small caliber vessel.  After multiple unsuccessful attempts to get into the  true lumen, I did not feel that further attempts would be successful.  I did not think that pedal access was appropriate given the size of her vessels and the extent of disease.  A final angiogram was performed which showed no significant change from preintervention.  Catheters and wires were removed.  The patient taken over here for  sheath pull.  Impression:  #1 unsuccessful attempt at revascularization of the left lower extremity  #2 early recurrence of bypass graft stenosis on the right  #3 no further options for revascularization   V. [name], M.D., [facility] [place] Office: [phone] Pager:  [phone]

## 2019-03-30 NOTE — Discharge Instructions (Signed)
Femoral Site Care °This sheet gives you information about how to care for yourself after your procedure. Your health care provider may also give you more specific instructions. If you have problems or questions, contact your health care provider. °What can I expect after the procedure? °After the procedure, it is common to have: °· Bruising that usually fades within 1-2 weeks. °· Tenderness at the site. °Follow these instructions at home: °Wound care °· Follow instructions from your health care provider about how to take care of your insertion site. Make sure you: °? Wash your hands with soap and water before you change your bandage (dressing). If soap and water are not available, use hand sanitizer. °? Change your dressing as told by your health care provider. °? Leave stitches (sutures), skin glue, or adhesive strips in place. These skin closures may need to stay in place for 2 weeks or longer. If adhesive strip edges start to loosen and curl up, you may trim the loose edges. Do not remove adhesive strips completely unless your health care provider tells you to do that. °· Do not take baths, swim, or use a hot tub until your health care provider approves. °· You may shower 24-48 hours after the procedure or as told by your health care provider. °? Gently wash the site with plain soap and water. °? Pat the area dry with a clean towel. °? Do not rub the site. This may cause bleeding. °· Do not apply powder or lotion to the site. Keep the site clean and dry. °· Check your femoral site every day for signs of infection. Check for: °? Redness, swelling, or pain. °? Fluid or blood. °? Warmth. °? Pus or a bad smell. °Activity °· For the first 2-3 days after your procedure, or as long as directed: °? Avoid climbing stairs as much as possible. °? Do not squat. °· Do not lift anything that is heavier than 10 lb (4.5 kg), or the limit that you are told, until your health care provider says that it is safe. °· Rest as  directed. °? Avoid sitting for a long time without moving. Get up to take short walks every 1-2 hours. °· Do not drive for 24 hours if you were given a medicine to help you relax (sedative). °General instructions °· Take over-the-counter and prescription medicines only as told by your health care provider. °· Keep all follow-up visits as told by your health care provider. This is important. °Contact a health care provider if you have: °· A fever or chills. °· You have redness, swelling, or pain around your insertion site. °Get help right away if: °· The catheter insertion area swells very fast. °· You pass out. °· You suddenly start to sweat or your skin gets clammy. °· The catheter insertion area is bleeding, and the bleeding does not stop when you hold steady pressure on the area. °· The area near or just beyond the catheter insertion site becomes pale, cool, tingly, or numb. °These symptoms may represent a serious problem that is an emergency. Do not wait to see if the symptoms will go away. Get medical help right away. Call your local emergency services (911 in the [place] Do not drive yourself to the hospital. °Summary °· After the procedure, it is common to have bruising that usually fades within 1-2 weeks. °· Check your femoral site every day for signs of infection. °· Do not lift anything that is heavier than 10 lb (4.5 kg), or the   limit that you are told, until your health care provider says that it is safe. °This information is not intended to replace advice given to you by your health care provider. Make sure you discuss any questions you have with your health care provider. °Document Released: 07/29/2014 Document Revised: 12/08/2017 Document Reviewed: 12/08/2017 °Elsevier Interactive Patient Education © 2019 [employer] ° °

## 2019-03-30 NOTE — Progress Notes (Signed)
Site area:  Scientific laboratory technician Prior to Removal:  Level 0 Pressure Applied For: 20 min Manual:   yes Patient Status During Pull:  A/O Post Pull Site:  Level 0 Post Pull Instructions Given:  Post instructions given and pt understands Post Pull Pulses Present: Doppler bilateral rt dp/pt, Lt pt/dp Dressing Applied:  tegaderm and a 4x4 Bedrest begins @ 11:15:00 Comments: Pt leaves Cath Lab holding area for Short Stay post recovery unit in stable condition. Rt fem site is unremarkable and dressing is CDI.

## 2019-03-30 NOTE — Interval H&P Note (Signed)
History and Physical Interval Note:  03/30/2019 7:37 AM  Carla Dawson  has presented today for surgery, with the diagnosis of pad.  The various methods of treatment have been discussed with the patient and family. After consideration of risks, benefits and other options for treatment, the patient has consented to  Procedure(s): LOWER EXTREMITY ANGIOGRAPHY (N/A) as a surgical intervention.  The patient's history has been reviewed, patient examined, no change in status, stable for surgery.  I have reviewed the patient's chart and labs.  Questions were answered to the patient's satisfaction.     [name]

## 2019-03-30 NOTE — Progress Notes (Signed)
Dressing to right groin changed. Pt voices understanding of discharge instructions.

## 2019-03-31 ENCOUNTER — Encounter (HOSPITAL_COMMUNITY): Payer: Self-pay | Admitting: Surgery

## 2019-04-01 ENCOUNTER — Encounter (HOSPITAL_COMMUNITY): Payer: Self-pay | Admitting: Surgery

## 2019-04-05 ENCOUNTER — Ambulatory Visit: Payer: Medicare Other | Admitting: Surgery

## 2019-04-05 ENCOUNTER — Telehealth: Payer: Self-pay

## 2019-04-05 NOTE — Telephone Encounter (Signed)
Carla Dawson (patients daughter) called and said that she was wanting to know if her mom was suppose to have a post op follow up. She said that usually someone calls with an appt.   Called Carla Dawson back and left a detailed message - advised that she does have an appt on 6/1 for a wound check and per Dr Aleen Campi note this is sufficient because he wanted to see her back in 6 weeks. She inquired as well to a stem cell trial that he had mentioned to her and advised this would be discussed at the time of the visit. Gave her the name of the trial ([employer]) and told her she could google it if she wanted more information to know of questions to ask at the upcoming appt.   York Cerise, CMA

## 2019-04-12 ENCOUNTER — Ambulatory Visit: Payer: Medicare Other | Admitting: Internal Medicine

## 2019-04-12 ENCOUNTER — Telehealth: Payer: Self-pay | Admitting: Cardiology

## 2019-04-12 NOTE — Telephone Encounter (Signed)
Follow up:   Patient returning your call back. patient has a block on her number.

## 2019-04-12 NOTE — Telephone Encounter (Signed)
Left message to call back  

## 2019-04-12 NOTE — Telephone Encounter (Signed)
New message:    Patient daughter calling to see if they can move her mothers appt up sooner.

## 2019-04-13 NOTE — Telephone Encounter (Signed)
Spoke with pt's daughter who state pt's pcp office is seeing pt's in office starting next week and was checking to see if we were following the same changes. Inform daughter our office is scheduling in office visit in Aug but can arrange for a sooner virtual visit. Daughter declined and voiced she will keep scheduled appointment for June.

## 2019-04-20 ENCOUNTER — Encounter: Payer: Self-pay | Admitting: Internal Medicine

## 2019-04-20 ENCOUNTER — Other Ambulatory Visit: Payer: Self-pay

## 2019-04-20 ENCOUNTER — Ambulatory Visit (INDEPENDENT_AMBULATORY_CARE_PROVIDER_SITE_OTHER): Payer: Medicare Other | Admitting: Internal Medicine

## 2019-04-20 VITALS — BP 142/60 | HR 84 | Temp 98.0°F | Ht 61.0 in | Wt 103.8 lb

## 2019-04-20 DIAGNOSIS — J9611 Chronic respiratory failure with hypoxia: Secondary | ICD-10-CM

## 2019-04-20 DIAGNOSIS — I70213 Atherosclerosis of native arteries of extremities with intermittent claudication, bilateral legs: Secondary | ICD-10-CM | POA: Diagnosis not present

## 2019-04-20 DIAGNOSIS — J449 Chronic obstructive pulmonary disease, unspecified: Secondary | ICD-10-CM | POA: Diagnosis not present

## 2019-04-20 NOTE — Progress Notes (Signed)
Subjective:   Patient ID: Carla Dawson, female    DOB: 10-30-1931,    MRN: [id number]     Brief patient profile:  85 yowf  Quit smoking 2007 with dx of copd in 2002 while living in [place] rx with Advair then Breo since quit smoking referred to pulmonary clinic 10/25/2016 by Dr   Betty Martinique   History of Present Illness  10/25/2016 1st [facility] office visit/ [facility]   Chief Complaint  Patient presents with  . Pulmonary Consult    Referred by Dr. Betty Martinique.  Pt here to est care for COPD. She c/o SOB with walking approx 800 ft on a flat surface, eating and walking up an incline. She has some PND but not coughing much.    MMRC2 = can't walk a nl pace on a flat grade s sob but does fine slow and flat eg walking up to 800 ft - she measures the distances herself to keep tack as also limited by claudication - rarely feels need for saba  rec Work on inhaler technique:  relax and gently blow all the way out then take a nice smooth deep breath back in, triggering the inhaler at same time you start breathing in.  Hold for up to 5 seconds if you can. Blow out thru nose. Rinse and gargle with water when done Plan A = Automatic = Symbicort 160 Take 2 puffs first thing in am and then another 2 puffs about 12 hours later.  Plan B = Backup Only use your albuterol (proair) as a rescue medication Plan C = Crisis - only use your albuterol nebulizer if you first try Plan B and it fails to help > ok to use the nebulizer up to every 4 hours but if start needing it regularly call for immediate appointment   02/27/2017  f/u ov/Wert re:  Copd / 02 prn maint on advair 250 bid and prn saba  Chief Complaint  Patient presents with  . Follow-up    Pt states was hospitalized while in [place] early March 2018. She states she was told that she may have had the beginning of PNA. She states today her breathing is "great". She has occ non prod cough. She was sent home with o2, but has  not used in the past several days.   breathing worse suddenly while in [place] not responsive  To albuterol or 2lpm > admit x 4 days and back to baseline  MMRC2 = can't walk a nl pace on a flat grade s sob but does fine slow and flat  rec Plan A = Automatic = Breo one click each am > take two good deep drags off it but click it just once  Plan B = Backup Only use your albuterol (PROAIR) as a rescue medication Plan C = Crisis - only use your albuterol nebulizer if you first try Plan B       02/04/2018  f/u ov/Wert re:  COPD III /one flare in Dec 2018 / BREO maint but req one course pred in last 3 m for aecopd  Chief Complaint  Patient presents with  . Follow-up    Last night she was eating and aspirated hamburger. She states that she feels pain in her throat when she takes a deep breath.  Her breathing is unchanged. She is using her proair 3-4 x per wk on average and rarely uses neb.   Dyspnea:  MMRC3 = can't walk 100 yards even  at a slow pace at a flat grade s stopping due to sob   Cough: not normally/  Sleep: ok/ no 02  SABA use:   No more than once a day/ maybe p shower  rec Plan A = Automatic = Breo or Trelegy daily  Work on inhaler technique:   Plan B = Backup Only use your albuterol (Proair)  as a rescue medication  Plan C = Crisis - only use your albuterol nebulizer if you first try Plan B Plan D = Deltasone take 6 days if you are having to use plan c and not working great Plan E  = ER, go there if all elso fails         11/25/2018 acute extended ov/Wert re: aecopd  Still on prednisone 20 mg daily  Since 11/18/18 Chief Complaint  Patient presents with  . Acute Visit    Increased SOB, rattling in chest and anxiety since the last visit on 11/17/18. She is using her proair 1-2 x daily and is using her neb 3 x daily.   sleeps at 30 degrees ok until night prior to ov now needing noct saba  No cough / no cp  rec Please remember to go to the lab department   for your tests  - we will call you with the results when they are available. Amlopidine 5 mg take one half each am  Keep prior appt - call sooner if needed   - late add recheck cbc with fe studies/ consider change norvasc to cardizem next ov          Date of admission: 12/06/2018             Date of discharge: 12/11/2018    Discharge Diagnoses:   Principal Problem:   COPD exacerbation ([facility]) Active Problems:   Essential hypertension, benign   PAD (peripheral artery disease) ([facility])   Neuropathy due to peripheral vascular disease ([place])   Hyperlipidemia   Anemia of chronic disease   SIRS (systemic inflammatory response syndrome) ([place])   History of present illness: As per the H and P dictated on admission, "Carla Dawson 85 y.o.femalewith medical history significant ofCOPD. OA, hyperlipidemia,anemia of chronic disease, HTN, PVD/PAD.Here with her daughter at bedside.  Over the past week, the patient has been having worsening shortness of breath. This morning, she woke up and her oxygen saturation dropped to the mid 80s. This worked her up and she can never quite fully settle down. She has been breathing rapidly despite normal saturations at home. Nebulization treatments did not work. She had an episode where she spit up some mucus but still felt her breathing was labored. She is compliant with home Spiriva and Symbicort. She has been using her albuterol frequently at home over the past week. The patient is having some cough and slight wheezing. She denies any fevers, recent illnesses, chest pain."  Hospital Course:  Summary of her active problems in the hospital is as following. Acute on chronic hypoxic respiratory failure due to COPD exacerbation -She has been feeling short of breath for a while at home suspect a degree of chronic hypoxia, will likely need home oxygen on discharge  A. fib with RVR. Paroxysmal. Now normal sinus rhythm. -Change Norvasc to Cardizem.  Patient was given Cardizem bolus. Currently the patient is in sinus rhythm. RVR event was secondary to respiratory distress as well as nebulizing therapy. Patient has just submitted her Holter monitoring. No CV stripsare saved from event for A.  fib. We will continue with aspirin and Plavix. Patient actually has a surgery scheduled later this month. Recommend outpatient follow-up with cardiology.  Hypertension -Continue current regimen, blood pressure is stable  PAD -Continue aspirin and Plavix, no concerning symptoms at this point  Neuropathy due to peripheral vascular disease -Continue gabapentin  Hyperlipidemia -Continue Lipitor  Anemia of chronic disease -Hemoglobin stable, no evidence of bleeding  Oral thrush. Continue nystatin on discharge.  01/04/2019  f/u ov/Wert re: GOLD III criteria/ 02 hs and prn daytime/ planning Fem art surgery this week/ no prednisone since prior flare  Chief Complaint  Patient presents with  . Follow-up    She had COPD exacerbation since the last appt and was admitted to the hospital. Her breathing has improved some but not wuite back to her normal baseline. She is using her xopenex inhaler 3 x per wk on average.   Dyspnea:  MMRC3 = can't walk 100 yards even at a slow pace at a flat grade s stopping due to sob   Cough: better does have flutter, not really using  Sleeping: on side bed flat  SABA use: as above / no neb  02:  3lpm at bedtime / not titrating with ex rec 02 is 3lpm at bedtime and adjust with activity to keep it above 90%  Plan A = Automatic = continue  symbicort 160 x 2 puffs then spiriva 2 pffs and 12 hours later just the symbicort 160 x 2 pffs Work on inhaler technique:     Plan B = Backup Only use your levoalbuterol (xopenex) as a rescue medication Plan C = Crisis - only use your albuterol nebulizer if you first try Plan B and it fails to help > ok to use the nebulizer up to every 4 hours but if start needing it  regularly call for immediate appointment Plan D = Deltasone = prednisone 10 mg 2 daily until all better then 1 daily x 3 days and stop  For cough mucinex dm 1200 mg every 12 hours with the flutter valve Try prilosec otc 20mg   Take 30-60 min before first meal of the day and Pepcid ac (famotidine) 20 mg one @  bedtime until cough is completely gone for at least a week without the need for cough suppression Please remember to go to the lab department   for your tests - we will call you with the results when they are available. Please schedule a follow up office visit in 4 weeks > did not return    04/20/2019  f/u ov/Wert re: copd GOLD III/  02 prn just finished pred one day prior to [place]  Patient presents with  . Follow-up    Breathing is overall doing well. She has occ non prod cough.    Dyspnea:  MMRC3 = can't walk 100 yards even at a slow pace at a flat grade s stopping due to sob   Cough: still some am throat congestion but not bringing anything up  Sleeping: rotated on side 1-3 pillows  SABA use:no neb, some xopenex hfa maybe up to twice daily even  when better and up to 4 x daily when sick "my daughter says I should use it more and it does help some (though never rechallenges)  02: prn    No obvious day to day or daytime variability or assoc excess/ purulent sputum or mucus plugs or hemoptysis or cp or chest tightness, subjective wheeze or overt sinus or hb symptoms  taking ppi bid not ac  Sleeping  without nocturnal  or early am exacerbation  of respiratory  c/o's or need for noct saba. Also denies any obvious fluctuation of symptoms with weather or environmental changes or other aggravating or alleviating factors except as outlined above   No unusual exposure hx or h/o childhood pna/ asthma or knowledge of premature birth.  Current Allergies, Complete Past Medical History, Past Surgical History, Family History, and Social History were reviewed in [facility] record.  ROS  The following are not active complaints unless bolded Hoarseness, sore throat, dysphagia, dental problems, itching, sneezing,  nasal congestion or discharge of excess mucus or purulent secretions, ear ache,   fever, chills, sweats, unintended wt loss or wt gain, classically pleuritic or exertional cp,  orthopnea pnd or arm/hand swelling  or leg swelling, presyncope, palpitations, abdominal pain, anorexia, nausea, vomiting, diarrhea  or change in bowel habits or change in bladder habits, change in stools or change in urine, dysuria, hematuria,  rash, arthralgias, visual complaints, headache, numbness, weakness or ataxia or problems with walking or coordination,  change in mood or  Memory/ claudication        Current Meds  Medication Sig  . aspirin EC 81 MG tablet Take 81 mg by mouth daily.  Marland Kitchen atorvastatin (LIPITOR) 10 MG tablet TAKE 1 TABLET(10 MG) BY MOUTH DAILY (Patient taking differently: Take 10 mg by mouth every evening. )  . budesonide-formoterol (SYMBICORT) 160-4.5 MCG/ACT inhaler Inhale 2 puffs into the lungs 2 (two) times daily.  . cetirizine (ZYRTEC) 10 MG tablet Take 10 mg by mouth at bedtime.   . clopidogrel (PLAVIX) 75 MG tablet TAKE 1 TABLET(75 MG) BY MOUTH DAILY (Patient taking differently: Take 75 mg by mouth daily. )  . dextromethorphan (DELSYM) 30 MG/5ML liquid Take 30 mg by mouth 2 (two) times daily as needed for cough.  . dextromethorphan-guaiFENesin (MUCINEX DM) 30-600 MG 12hr tablet Take 0.5 tablets by mouth 2 (two) times daily as needed for cough.   . diltiazem (CARDIZEM CD) 180 MG 24 hr capsule Take 1 capsule (180 mg total) by mouth daily.  . famotidine (PEPCID) 20 MG tablet Take 20 mg by mouth daily as needed for heartburn or indigestion.   . gabapentin (NEURONTIN) 100 MG capsule Take 1 capsule (100 mg total) by mouth 3 (three) times daily. (Patient taking differently: Take 100 mg by mouth 3 (three) times daily as needed (for legs pain). )  .  levalbuterol (XOPENEX HFA) 45 MCG/ACT inhaler Inhale 1 puff into the lungs every 4 (four) hours as needed for wheezing. For COPD ICD 10: J44. 9 For A fib ICD: 10 I48. 9  . Multiple Vitamin (MULTIVITAMIN WITH MINERALS) TABS tablet Take 1 tablet by mouth at bedtime.   Marland Kitchen omeprazole (PRILOSEC) 20 MG capsule Take 20 mg by mouth daily as needed (heartburn or indigestion).   . sodium chloride (OCEAN) 0.65 % SOLN nasal spray Place 1 spray into both nostrils as needed for congestion.  Marland Kitchen SPIRIVA RESPIMAT 2.5 MCG/ACT AERS INHALE 2 PUFFS INTO THE LUNGS DAILY (Patient taking differently: Inhale 2 puffs into the lungs daily. )  . traMADol (ULTRAM) 50 MG tablet Take 1 tablet (50 mg total) by mouth every 6 (six) hours as needed for moderate pain.  . Vitamins A & D (VITAMIN A & D) ointment Apply 1 application topically as needed for dry skin.             Objective:   Physical Exam  amb hoarse wf nad   04/20/2019      103  01/04/2019      107  11/25/2018   112  11/17/2018    115  07/29/2018      112  06/29/2018      113  04/03/2018       110  03/23/2018       111  02/04/2018       109  10/14/2017       108  04/03/2017       100   02/27/2017      96  10/25/16 92 lb 9.6 oz (42 kg)  10/15/16 93 lb 9 oz (42.4 kg)  10/14/16 93 lb (42.2 kg)     Vital signs reviewed - Note on arrival 02 sats  94% on RA      HEENT:  Full Dentures/ nl  oropharynx. Nl external ear canals without cough reflex -  Mild bilateral non-specific turbinate edema     NECK :  without JVD/Nodes/TM/ nl carotid upstrokes bilaterally   LUNGS: no acc muscle use,  Mod barrel  contour chest wall with bilateral  Distant bs s audible wheeze and  without cough on insp or exp maneuver and mod  Hyperresonant  to  percussion bilaterally     CV:  RRR  no s3 or murmur or increase in P2, and no edema   ABD:  soft and nontender with pos mid  insp Hoover's  in the supine position. No bruits or organomegaly appreciated, bowel sounds nl  MS:   Nl  gait/  ext warm without deformities, calf tenderness, cyanosis or clubbing No obvious joint restrictions   SKIN: warm and dry without lesions    NEURO:  alert, approp, nl sensorium with  no motor or cerebellar deficits apparent.                   Assessment & Plan:

## 2019-04-20 NOTE — Assessment & Plan Note (Signed)
Quit smoking 2007  10/25/2016  try symbicort 160 2bid instead  of breo > preferred breo - 02/27/2017  After extensive coaching HFA effectiveness =    25% with hfa/ 75% with dpi so try back to BREO - PFT's  04/03/2017  FEV1 0.59 (37 % ) ratio 34  p 12  % improvement from saba p BREO  prior to study with DLCO  35/35 % corrects to 49  % for alv volume   - 10/14/2017    > try dulera 100 2bid due to hoarseness on breo   - 02/04/18 >  Added prednisone as Plan D - 02/04/2018 trial of trelegy>  Refractory aecopd 04/03/2018 > changed to symb 160/ spiriva 2.5 and pred 20 until better than taper off as Plan D    - alpha one AT screening  01/05/2019   MM  Level 206 - 04/20/2019  After extensive coaching inhaler device,  effectiveness =    90%    Group D in terms of symptom/risk and laba/lama/ICS  therefore appropriate rx at this point >>>  No change symb/ spiriva    I spent extra time with pt today reviewing appropriate use of albuterol for prn use on exertion with the following points: 1) saba is for relief of sob that does not improve by walking a slower pace or resting but rather if the pt does not improve after trying this first. 2) If the pt is convinced, as many are, that saba helps recover from activity faster then it's easy to tell if this is the case by re-challenging : ie stop, take the inhaler, then p 5 minutes try the exact same activity (intensity of workload) that just caused the symptoms and see if they are substantially diminished or not after saba 3) if there is an activity that reproducibly causes the symptoms, try the saba 15 min before the activity on alternate days   If in fact the saba really does help, then fine to continue to use it prn but advised may need to look closer at the maintenance regimen being used to achieve better control of airways disease with exertion.   Also with exac > gerd rx is mandatory, not prn

## 2019-04-20 NOTE — Patient Instructions (Signed)
Whenever cough or throat congestion flare > Try prilosec otc 20mg   Take 30-60 min before first meal of the day and Pepcid ac (famotidine) 20 mg one @  bedtime until cough is completely gone for at least a week without the need for cough suppression (no need for mucinex dm )    Your technique is good on your inhaler   Continue to adjust your 02 flow to keep saturations above 90%    Only use your levoalbuterol as a rescue medication to be used if you can't catch your breath by resting or doing a relaxed purse lip breathing pattern.  - The less you use it, the better it will work when you need it. - Ok to use up to 2 puffs  every 4 hours if you must but call for immediate appointment if use goes up over your usual need - Don't leave home without it !!  (think of it like the spare tire for your car)    Please schedule a follow up visit in 4  months but call sooner if needed

## 2019-04-20 NOTE — Assessment & Plan Note (Signed)
Chronic noct 02 dep @ 3lpm/ prn daytime to keep sats > 90%   Not following prev instructions  rec 02 to keep > 90% walking / f/u when returns from [place] in fall   I had an extended discussion with the patient reviewing all relevant studies completed to date and  lasting 15 to 20 minutes of a 25 minute visit    See device teaching which extended face to face time for this visit.  Each maintenance medication was reviewed in detail including emphasizing most importantly the difference between maintenance and prns and under what circumstances the prns are to be triggered using an action plan format that is not reflected in the computer generated alphabetically organized AVS which I have not found useful in most complex patients, especially with respiratory illnesses  Please see AVS for specific instructions unique to this visit that I personally wrote and verbalized to the the pt in detail and then reviewed with pt  by my nurse highlighting any  changes in therapy recommended at today's visit to their plan of care.

## 2019-05-02 ENCOUNTER — Other Ambulatory Visit: Payer: Self-pay | Admitting: Internal Medicine

## 2019-05-04 ENCOUNTER — Telehealth: Payer: Self-pay | Admitting: Internal Medicine

## 2019-05-04 NOTE — Telephone Encounter (Signed)
LMTCB

## 2019-05-04 NOTE — Telephone Encounter (Signed)
Called pt's daughter [name] but unable to reach. Left message for [name] to return call.

## 2019-05-04 NOTE — Telephone Encounter (Signed)
Pt's daughter [name] wanted to know if you can call in Prednisone to [facility]  [phone]  Please leave a detailed message

## 2019-05-05 NOTE — Telephone Encounter (Signed)
LMTCB x2 for pt's daughter, [name].

## 2019-05-06 NOTE — Telephone Encounter (Signed)
Spoke with pt's daughter, [name]. States that this refill has been taken care of. Nothing further was needed at this time.

## 2019-05-10 ENCOUNTER — Encounter: Payer: Self-pay | Admitting: Surgery

## 2019-05-10 ENCOUNTER — Other Ambulatory Visit: Payer: Self-pay | Admitting: Internal Medicine

## 2019-05-10 ENCOUNTER — Ambulatory Visit (INDEPENDENT_AMBULATORY_CARE_PROVIDER_SITE_OTHER): Payer: Medicare Other | Admitting: Surgery

## 2019-05-10 ENCOUNTER — Other Ambulatory Visit: Payer: Self-pay

## 2019-05-10 VITALS — BP 152/67 | HR 75 | Temp 97.4°F | Resp 20 | Ht 61.0 in | Wt 101.3 lb

## 2019-05-10 DIAGNOSIS — I70245 Atherosclerosis of native arteries of left leg with ulceration of other part of foot: Secondary | ICD-10-CM

## 2019-05-10 MED ORDER — PREDNISONE 10 MG PO TABS: ORAL_TABLET | ORAL | 0 refills | Status: AC

## 2019-05-10 NOTE — Progress Notes (Signed)
 [facility] [place]  Patient name: Carla Dawson MRN: [id number] DOB: 02/20/1931 Sex: female   REASON FOR VISIT:    Follow up  HISOTRY OF PRESENT ILLNESS:    This is an 85-year-old female whom I met in 2017 for peripheral vascular disease. Previously, in another state, she was dealing with bilateral rest pain. She underwent percutaneous revascularization of her right leg which was complicated by acute occlusion as well as perforation which required cover stenting. This ultimately led to surgical revascularization which appears to be a right femoral to tibioperoneal trunk bypass graft. On the left leg, the patient has undergone multiple percutaneous interventions including atherectomy and angioplasty of the superficial femoral and popliteal artery as well as angioplasty of anterior tibial artery.On 04-12-2018, she underwent emergent left femoral to popliteal bypass with gortex with VPA of the CFA and Popliteal. She developed worsening claudication symptoms and on 11-03-2018 she had angioplasty of a CFA lesion. She had a dissection and went to the OR the next day for occlusion where endarterectomy was performed. She has a known stenosis in her left groin and is now having wound issues on her left heel.  On 01/06/2019 she went to the operating room for a left iliofemoral endarterectomy with patch angioplasty as well as a stent within her left popliteal artery.  She lost Doppler signals in the recovery room and was taken back to the operating room for thrombectomy of her left femoral-popliteal bypass graft.  She no longer has options for revascularization.  She does not have open wounds.  She does have rest pain at night on the right leg  She continues to be medically managed for hypertension. She takes a statin for hypercholesterolemia. She is on dual anti-platelet therapy   PAST MEDICAL HISTORY:   Past Medical History:   Diagnosis Date  . Allergy    "maybe seasonal allergies" (07/08/2017)  . Arthritis    "hands" (07/08/2017)  . Chicken pox   . COPD (chronic obstructive pulmonary disease) (HCC)   . Dyspnea   . Dysrhythmia    "racing heart" - medication related  . GERD (gastroesophageal reflux disease)   . High cholesterol    "took me off pills ~ 1-2 months ago" (07/08/2017)  . History of blood transfusion 2017   "when I had blood clot in my leg"  . Hypertension   . PAD (peripheral artery disease) (HCC)    severe/notes 10/14/2016  . Pneumonia ~ 2015  . PVD (peripheral vascular disease) (HCC)      FAMILY HISTORY:   Family History  Problem Relation Age of Onset  . Arthritis Mother   . Hypertension Mother   . Arthritis Father   . Hypertension Father   . Cancer Brother   . Hypertension Son     SOCIAL HISTORY:   Social History   Tobacco Use  . Smoking status: Former Smoker    Packs/day: 1.50    Years: 52.00    Pack years: 78.00    Start date: 12/09/1953    Last attempt to quit: 12/09/2005    Years since quitting: 13.4  . Smokeless tobacco: Never Used  Substance Use Topics  . Alcohol use: No     ALLERGIES:   Allergies  Allergen Reactions  . Lortab [Hydrocodone-Acetaminophen] Shortness Of Breath and Itching  . Morphine And Related Other (See Comments)    Unknown reaction     CURRENT MEDICATIONS:   Current Outpatient Medications  Medication Sig Dispense Refill  .   aspirin EC 81 MG tablet Take 81 mg by mouth daily.    . atorvastatin (LIPITOR) 10 MG tablet TAKE 1 TABLET(10 MG) BY MOUTH DAILY (Patient taking differently: Take 10 mg by mouth every evening. ) 90 tablet 2  . cetirizine (ZYRTEC) 10 MG tablet Take 10 mg by mouth at bedtime.     . clopidogrel (PLAVIX) 75 MG tablet TAKE 1 TABLET(75 MG) BY MOUTH DAILY (Patient taking differently: Take 75 mg by mouth daily. ) 90 tablet 2  . dextromethorphan (DELSYM) 30 MG/5ML liquid Take 30 mg by mouth 2 (two) times daily as needed for cough.     . dextromethorphan-guaiFENesin (MUCINEX DM) 30-600 MG 12hr tablet Take 0.5 tablets by mouth 2 (two) times daily as needed for cough.     . diltiazem (CARDIZEM CD) 180 MG 24 hr capsule Take 1 capsule (180 mg total) by mouth daily. 90 capsule 2  . famotidine (PEPCID) 20 MG tablet Take 20 mg by mouth daily as needed for heartburn or indigestion.     . gabapentin (NEURONTIN) 100 MG capsule Take 1 capsule (100 mg total) by mouth 3 (three) times daily. (Patient taking differently: Take 100 mg by mouth 3 (three) times daily as needed (for legs pain). ) 90 capsule 2  . levalbuterol (XOPENEX HFA) 45 MCG/ACT inhaler Inhale 1 puff into the lungs every 4 (four) hours as needed for wheezing. For COPD ICD 10: J44. 9 For A fib ICD: 10 I48. 9 1 Inhaler 2  . Multiple Vitamin (MULTIVITAMIN WITH MINERALS) TABS tablet Take 1 tablet by mouth at bedtime.     . omeprazole (PRILOSEC) 20 MG capsule Take 20 mg by mouth daily as needed (heartburn or indigestion).     . predniSONE (DELTASONE) 10 MG tablet 10mg, 2 daily until all better, then 1 daily x's 5 days, and stop 100 tablet 0  . sodium chloride (OCEAN) 0.65 % SOLN nasal spray Place 1 spray into both nostrils as needed for congestion. 1 Bottle 0  . SPIRIVA RESPIMAT 2.5 MCG/ACT AERS INHALE 2 PUFFS INTO THE LUNGS DAILY (Patient taking differently: Inhale 2 puffs into the lungs daily. ) 1 Inhaler 11  . SYMBICORT 160-4.5 MCG/ACT inhaler INHALE 2 PUFFS INTO THE LUNGS TWICE DAILY 10.2 g 11  . traMADol (ULTRAM) 50 MG tablet Take 1 tablet (50 mg total) by mouth every 6 (six) hours as needed for moderate pain. 20 tablet 0  . Vitamins A & D (VITAMIN A & D) ointment Apply 1 application topically as needed for dry skin.     No current facility-administered medications for this visit.     REVIEW OF SYSTEMS:   [X] denotes positive finding, [ ] denotes negative finding Cardiac  Comments:  Chest pain or chest pressure:    Shortness of breath upon exertion:    Short of breath  when lying flat:    Irregular heart rhythm:        Vascular    Pain in calf, thigh, or hip brought on by ambulation: x   Pain in feet at night that wakes you up from your sleep:  x   Blood clot in your veins:    Leg swelling:         Pulmonary    Oxygen at home:    Productive cough:     Wheezing:         Neurologic    Sudden weakness in arms or legs:     Sudden numbness in arms or legs:       Sudden onset of difficulty speaking or slurred speech:    Temporary loss of vision in one eye:     Problems with dizziness:         Gastrointestinal    Blood in stool:     Vomited blood:         Genitourinary    Burning when urinating:     Blood in urine:        Psychiatric    Major depression:         Hematologic    Bleeding problems:    Problems with blood clotting too easily:        Skin    Rashes or ulcers:        Constitutional    Fever or chills:      PHYSICAL EXAM:   Vitals:   05/10/19 0918  BP: (!) 152/67  Pulse: 75  Resp: 20  Temp: (!) 97.4 F (36.3 C)  SpO2: 91%  Weight: 45.9 kg  Height: 5' 1" (1.549 m)    GENERAL: The patient is a well-nourished female, in no acute distress. The vital signs are documented above. CARDIAC: There is a regular rate and rhythm.  VASCULAR: brisk left PT, No signals on right PULMONARY: Non-labored respirations MUSCULOSKELETAL: There are no major deformities or cyanosis. NEUROLOGIC: No focal weakness or paresthesias are detected. SKIN: There are no ulcers or rashes noted. PSYCHIATRIC: The patient has a normal affect.  STUDIES:   none  MEDICAL ISSUES:   Ischemic rest pain:  Patient aware that she is without options for revascularization.  Her next operation will be amputation.  I discussed Hemost[employer] study with her, and she is interested.  I have scheduled her for follow up in 6 months.   She will call sooner if she has trouble   Wells Brabham, IV, MD, FACS [facility] Tel [phone]  Pager [phone] 

## 2019-05-17 ENCOUNTER — Encounter (HOSPITAL_COMMUNITY): Payer: Medicare Other

## 2019-05-17 ENCOUNTER — Ambulatory Visit: Payer: Medicare Other | Admitting: Surgery

## 2019-05-18 ENCOUNTER — Ambulatory Visit: Payer: Medicare Other | Admitting: Family Medicine

## 2019-05-24 ENCOUNTER — Telehealth: Payer: Self-pay | Admitting: Internal Medicine

## 2019-05-24 NOTE — Telephone Encounter (Signed)
Spoke with pharmacist. She was made aware of [facility]'s response. She will go ahead and fill the RX.   Nothing further needed at time of call.

## 2019-05-24 NOTE — Telephone Encounter (Signed)
I am not able to predict how many she will need as this is a prn so ok to give #100 as I originally ordered

## 2019-05-24 NOTE — Telephone Encounter (Signed)
[facility] pharmacy questioning quantity of #100 prednisone due to directions. Please advise  10mg , 2 daily until all better, then 1 daily x's 5 days, and stop

## 2019-05-25 ENCOUNTER — Telehealth: Payer: Self-pay | Admitting: *Deleted

## 2019-05-25 NOTE — Telephone Encounter (Signed)
Received a call from Dr. Stephens Shire office about this patient being a potential [employer] research study candidate and daughter [name]) concerned that no one had called to speak with them about the study. I obtained the daughters contact info and explained to Office ( [facility]) that we are not enrolling at this time due to [place] -39 and that I would be more than happy to contact Daughter.    Spoke with [name] (daughter) in detail about the study and that we are on hold. I emailed her the [employer] for her review if we start to enroll she would have the information.

## 2019-05-31 ENCOUNTER — Ambulatory Visit: Payer: Medicare Other | Admitting: Cardiology

## 2019-05-31 ENCOUNTER — Telehealth: Payer: Self-pay

## 2019-05-31 ENCOUNTER — Ambulatory Visit: Payer: Medicare Other | Admitting: Family Medicine

## 2019-05-31 NOTE — Telephone Encounter (Signed)
Carla Dawson - patients daughter called and requested an OV because she said that her moms legs are getting worse and that Dr Trula Slade had mentioned if this happened that amputation may be in her future. Said that she is not able to walk more than 20 steps across the room.   Called and scheduled an appt for patient to be seen. She really wanted to see Dr Trula Slade but he does not have an opening for quite some time on his schedule. Appt made and notified daughter if symptoms get worse to go to the ER for evaluation.   York Cerise, CMA

## 2019-06-01 ENCOUNTER — Other Ambulatory Visit: Payer: Self-pay

## 2019-06-01 ENCOUNTER — Ambulatory Visit (HOSPITAL_COMMUNITY)
Admission: RE | Admit: 2019-06-01 | Discharge: 2019-06-01 | Disposition: A | Discharge status: Home / Self Care | Payer: Medicare Other | Source: Ambulatory Visit | Attending: Family | Admitting: Family

## 2019-06-01 ENCOUNTER — Telehealth (HOSPITAL_COMMUNITY): Payer: Self-pay | Admitting: Rehabilitation

## 2019-06-01 DIAGNOSIS — I70245 Atherosclerosis of native arteries of left leg with ulceration of other part of foot: Secondary | ICD-10-CM | POA: Diagnosis not present

## 2019-06-01 NOTE — Telephone Encounter (Signed)

## 2019-06-02 ENCOUNTER — Other Ambulatory Visit: Payer: Self-pay

## 2019-06-02 ENCOUNTER — Telehealth: Payer: Self-pay | Admitting: Surgery

## 2019-06-02 ENCOUNTER — Encounter: Payer: Self-pay | Admitting: Surgery

## 2019-06-02 ENCOUNTER — Ambulatory Visit (INDEPENDENT_AMBULATORY_CARE_PROVIDER_SITE_OTHER): Payer: Medicare Other | Admitting: Family

## 2019-06-02 ENCOUNTER — Encounter: Payer: Self-pay | Admitting: Family

## 2019-06-02 VITALS — BP 143/60 | HR 82 | Temp 97.6°F | Resp 16 | Ht 61.0 in | Wt 108.0 lb

## 2019-06-02 DIAGNOSIS — I739 Peripheral vascular disease, unspecified: Secondary | ICD-10-CM

## 2019-06-02 DIAGNOSIS — I70223 Atherosclerosis of native arteries of extremities with rest pain, bilateral legs: Secondary | ICD-10-CM

## 2019-06-02 DIAGNOSIS — Z87891 Personal history of nicotine dependence: Secondary | ICD-10-CM

## 2019-06-02 MED ORDER — OXYCODONE HCL 5 MG PO TABS: 5.0000 mg | ORAL_TABLET | Freq: Three times a day (TID) | ORAL | 0 refills | Status: AC | PRN

## 2019-06-02 MED ORDER — TRAMADOL HCL 50 MG PO TABS: 50.0000 mg | ORAL_TABLET | Freq: Four times a day (QID) | ORAL | 0 refills | Status: AC | PRN

## 2019-06-02 NOTE — Progress Notes (Signed)
[facility] [place]   CC: Follow up peripheral artery occlusive disease  History of Present Illness Carla Dawson is a 85 y.o. female whom Dr. Trula Slade met in 2017 for peripheral vascular disease. [place], [place] state, she was dealing with bilateral rest pain. She underwent percutaneous revascularization of her right leg which was complicated by acute occlusion as well as perforation which required cover stenting. This ultimately led to surgical revascularization which appears to be a right femoral to tibioperoneal trunk bypass graft. On the left leg, the patient has undergone multiple percutaneous interventions including atherectomy and angioplasty of the superficial femoral and popliteal artery as well as angioplasty of anterior tibial artery.On 04-12-2018, she underwent emergent left femoral to popliteal bypass with gortex with VPA of the CFA and Popliteal. She developed worsening claudication symptoms and on 11-03-2018 she had angioplasty of a CFA lesion. She had a dissection and went to the OR the next day for occlusion where endarterectomy was performed.   She has a known stenosis in her left groin. On 01/06/2019 she went to the operating room for a left iliofemoral endarterectomy with patch angioplasty as well as a stent within her left popliteal artery. She lost Doppler signals in the recovery room and was taken back to the operating room for thrombectomy of her left femoral-popliteal bypass graft.   She no longer has options for revascularization.  She does not have open wounds.  She does have rest pain at night in both legs, left more so than right.  Dr. Trula Slade last evaluated pt on 05-10-19. At that time pt had Ischemic rest pain in her legs:  Patient aware that she is without options for revascularization.  Her next operation would be amputation.  Dr. Trula Slade discussed Hemostemix study with her, and she was interested. Dr. Trula Slade advised follow up in  6 months. She was to call sooner if she has trouble  Pt returns today with c/o rest pain in both calves that wakes her at night.  She is taking tramadol for pain, 1 tablet every 6 hours, but has breakthrough pain.  She continues to be medically managed for hypertension. She takes a statin for hypercholesterolemia. She is on dual anti-platelet therapy   Diabetic: No Tobacco use: former smoker, quit in 2007, smoked 1.5 ppd x 52 years  Pt meds include: Statin :Yes Betablocker: No ASA: Yes Other anticoagulants/antiplatelets: Plavix  Past Medical History:  Diagnosis Date  . Allergy    "maybe seasonal allergies" (07/08/2017)  . Arthritis    "hands" (07/08/2017)  . Chicken pox   . COPD (chronic obstructive pulmonary disease) ([place])   . Dyspnea   . Dysrhythmia    "racing heart" - medication related  . GERD (gastroesophageal reflux disease)   . High cholesterol    "took me off pills ~ 1-2 months ago" (07/08/2017)  . History of blood transfusion 2017   "when I had blood clot in my leg"  . Hypertension   . PAD (peripheral artery disease) ([place])    severe/notes 10/14/2016  . Pneumonia ~ 2015  . PVD (peripheral vascular disease) ([place])     Social History Social History   Tobacco Use  . Smoking status: Former Smoker    Packs/day: 1.50    Years: 52.00    Pack years: 78.00    Start date: 12/09/1953    Quit date: 12/09/2005    Years since quitting: 13.4  . Smokeless tobacco: Never Used  Substance Use Topics  . Alcohol  use: No  . Drug use: No    Family History Family History  Problem Relation Age of Onset  . Arthritis Mother   . Hypertension Mother   . Arthritis Father   . Hypertension Father   . Cancer Brother   . Hypertension Son     Past Surgical History:  Procedure Laterality Date  . ABDOMINAL AORTOGRAM N/A 04/22/2017   Procedure: Abdominal Aortogram;  Surgeon: Serafina Mitchell, MD;  Location: [facility];  Service: Cardiovascular;  Laterality: N/A;  .  ABDOMINAL AORTOGRAM N/A 03/30/2019   Procedure: ABDOMINAL AORTOGRAM;  Surgeon: Serafina Mitchell, MD;  Location: [facility];  Service: Cardiovascular;  Laterality: N/A;  . ABDOMINAL AORTOGRAM W/LOWER EXTREMITY N/A 03/04/2017   Procedure: Abdominal Aortogram w/Lower Extremity;  Surgeon: Serafina Mitchell, MD;  Location: [facility];  Service: Cardiovascular;  Laterality: N/A;  . ABDOMINAL AORTOGRAM W/LOWER EXTREMITY N/A 07/08/2017   Procedure: Abdominal Aortogram w/Lower Extremity;  Surgeon: Serafina Mitchell, MD;  Location: [facility];  Service: Cardiovascular;  Laterality: N/A;  . ABDOMINAL AORTOGRAM W/LOWER EXTREMITY Bilateral 03/10/2019   Procedure: ABDOMINAL AORTOGRAM W/LOWER EXTREMITY;  Surgeon: Serafina Mitchell, MD;  Location: [facility];  Service: Cardiovascular;  Laterality: Bilateral;  . APPLICATION OF WOUND VAC Left 01/06/2019   Procedure: Application Of Wound Vac;  Surgeon: Serafina Mitchell, MD;  Location: [facility];  Service: Vascular;  Laterality: Left;  . BIOPSY THYROID    . CATARACT EXTRACTION W/ INTRAOCULAR LENS  IMPLANT, BILATERAL Bilateral   . cataracts     bilateral  . EMBOLECTOMY Right 04/09/2018   Procedure: RIGHT COMMON FEMORAL ENDARTERECTOMY  WITH BOVINE PATCH ANGIOPLASTY; RIGHT LOWER LEG THROMBOEMBOLECTOMY INTRAOPERATIVE ARTERIOGRAM;  Surgeon: Waynetta Sandy, MD;  Location: [facility];  Service: Vascular;  Laterality: Right;  . ENDARTERECTOMY FEMORAL Right 11/04/2018   Procedure: ENDARTERECTOMY ILIO-FEMORAL ENDARTECTOMY;  Surgeon: Serafina Mitchell, MD;  Location: [facility];  Service: Vascular;  Laterality: Right;  . ENDARTERECTOMY FEMORAL Left 01/06/2019   Procedure: Redo Left Femoral Artery Endarterectomy;  Surgeon: Serafina Mitchell, MD;  Location: [facility];  Service: Vascular;  Laterality: Left;  . ENDARTERECTOMY POPLITEAL Left 01/06/2019   Procedure: ENDARTECTOMY POPITEAL TIBIAL PERITONEAL TRUNK;  Surgeon: Serafina Mitchell, MD;  Location: [facility];  Service:  Vascular;  Laterality: Left;  . EYE SURGERY    . FEMORAL-POPLITEAL BYPASS GRAFT Left 04/12/2018   Procedure: Vein Patch Angioplasty Below Knee Popliteal Artery; Left Femoral Endarterectomy with Profundaplasty; Left Femoral-Below Knee Popliteal Bypass Graft using a 39m by 80cm Thin Wall Propaten Gortex Graft;  Surgeon: FElam Dutch MD;  [facility]  Service: Vascular;  Laterality: Left;  . HEMATOMA EVACUATION Right 2017   "S/P procedure; discharged; had to come back for emergency OR"  . INSERTION OF ILIAC STENT Left 01/06/2019   Procedure: Insertion Of Popliteal artery  Viabahn Stent 565mx 5cm;  Surgeon: BrSerafina MitchellMD;  [facility];  Service: Vascular;  Laterality: Left;  . LOWER EXTREMITY ANGIOGRAM Left 01/06/2019   Procedure: Lower Extremity Angiogram;  Surgeon: BrSerafina MitchellMD;  [facility];  Laterality: Left;  . LOWER EXTREMITY ANGIOGRAPHY Bilateral 04/22/2017   Procedure: Lower Extremity Angiography;  Surgeon: BrSerafina MitchellMD;  [facility];  Service: Cardiovascular;  Laterality: Bilateral;  . LOWER EXTREMITY ANGIOGRAPHY Bilateral 04/07/2018   Procedure: LOWER EXTREMITY ANGIOGRAPHY;  Surgeon: BrSerafina MitchellMD;  [facility];  Service: Cardiovascular;  Laterality: Bilateral;  . LOWER EXTREMITY ANGIOGRAPHY N/A 11/03/2018   Procedure: LOWER EXTREMITY ANGIOGRAPHY;  Surgeon: Serafina Mitchell, MD;  Location: [facility];  Service: Cardiovascular;  Laterality: N/A;  . LOWER EXTREMITY ANGIOGRAPHY Bilateral 03/30/2019   Procedure: LOWER EXTREMITY ANGIOGRAPHY;  Surgeon: Serafina Mitchell, MD;  Location: [facility];  Service: Cardiovascular;  Laterality: Bilateral;  . PATCH ANGIOPLASTY Right 11/04/2018   Procedure: PATCH ANGIOPLASTY WITH Weyman Pedro;  Surgeon: Serafina Mitchell, MD;  [facility];  Service: Vascular;  Laterality: Right;  . PATCH ANGIOPLASTY Left 01/06/2019   Procedure: Patch  Angioplasty Left Femoral Artery using Xenosure Biologic 1cm x 6cm Patch;  Surgeon: Serafina Mitchell, MD;  Location: [facility];  Service: Vascular;  Laterality: Left;  . PATCH ANGIOPLASTY Left 01/06/2019   Procedure: [place] PATCH OF POPITEAL TIBIAL PERITONEAL TRUNK;  Surgeon: Serafina Mitchell, MD;  Location: [facility];  Service: Vascular;  Laterality: Left;  . PERIPHERAL VASCULAR BALLOON ANGIOPLASTY Left 03/04/2017   Procedure: Peripheral Vascular Balloon Angioplasty;  Surgeon: Serafina Mitchell, MD;  Location: [facility];  Service: Cardiovascular;  Laterality: Left;  SFA and PT  . PERIPHERAL VASCULAR BALLOON ANGIOPLASTY Right 11/03/2018   Procedure: PERIPHERAL VASCULAR BALLOON ANGIOPLASTY;  Surgeon: Serafina Mitchell, MD;  Location: [facility];  Service: Cardiovascular;  Laterality: Right;  common femoral  . PERIPHERAL VASCULAR BALLOON ANGIOPLASTY  03/10/2019   Procedure: PERIPHERAL VASCULAR BALLOON ANGIOPLASTY;  Surgeon: Serafina Mitchell, MD;  Location: [facility];  Service: Cardiovascular;;  right peroneal and femoral peroneal bypass  . PERIPHERAL VASCULAR BALLOON ANGIOPLASTY Left 03/30/2019   Procedure: PERIPHERAL VASCULAR BALLOON ANGIOPLASTY;  Surgeon: Serafina Mitchell, MD;  Location: [facility];  Service: Cardiovascular;  Laterality: Left;  peroneal  unsuccessful  . PERIPHERAL VASCULAR INTERVENTION  07/08/2017  . PERIPHERAL VASCULAR INTERVENTION  07/08/2017   Procedure: Peripheral Vascular Intervention;  Surgeon: Serafina Mitchell, MD;  Location: [facility];  Service: Cardiovascular;;  . PERIPHERAL VASCULAR INTERVENTION Left 04/07/2018   Procedure: PERIPHERAL VASCULAR INTERVENTION;  Surgeon: Serafina Mitchell, MD;  Location: [facility];  Service: Cardiovascular;  Laterality: Left;  . THROMBECTOMY FEMORAL ARTERY Left 01/06/2019   Procedure: Thrombectomy Left Femoral Artery to Left Popliteal artery;  Surgeon: Serafina Mitchell, MD;   Location[facility];  Service: Vascular;  Laterality: Left;  . THROMBECTOMY FEMORAL ARTERY Left 01/06/2019   Procedure: THROMBECTOMY FEMORAL-POPITEAL ARTERY;  Surgeon: Serafina Mitchell, MD;  Location: [facility];  Service: Vascular;  Laterality: Left;  . THROMBECTOMY ILIAC ARTERY Right 11/04/2018   Procedure: THROMBECTOMY RIGHT ILIO AND FEMORAL-TIBIAL BYPASS;  Surgeon: Serafina Mitchell, MD;  [facility];  Service: Vascular;  Laterality: Right;  Marland Kitchen VAGINAL HYSTERECTOMY    . VEIN BYPASS SURGERY      Allergies  Allergen Reactions  . Lortab [Hydrocodone-Acetaminophen] Shortness Of Breath and Itching  . Morphine And Related Other (See Comments)    Unknown reaction    Current Outpatient Medications  Medication Sig Dispense Refill  . aspirin EC 81 MG tablet Take 81 mg by mouth daily.    Marland Kitchen atorvastatin (LIPITOR) 10 MG tablet TAKE 1 TABLET(10 MG) BY MOUTH DAILY (Patient taking differently: Take 10 mg by mouth every evening. ) 90 tablet 2  . cetirizine (ZYRTEC) 10 MG tablet Take 10 mg by mouth at bedtime.     . clopidogrel (  PLAVIX) 75 MG tablet TAKE 1 TABLET(75 MG) BY MOUTH DAILY (Patient taking differently: Take 75 mg by mouth daily. ) 90 tablet 2  . dextromethorphan (DELSYM) 30 MG/5ML liquid Take 30 mg by mouth 2 (two) times daily as needed for cough.    . dextromethorphan-guaiFENesin (MUCINEX DM) 30-600 MG 12hr tablet Take 0.5 tablets by mouth 2 (two) times daily as needed for cough.     . diltiazem (CARDIZEM CD) 180 MG 24 hr capsule Take 1 capsule (180 mg total) by mouth daily. 90 capsule 2  . famotidine (PEPCID) 20 MG tablet Take 20 mg by mouth daily as needed for heartburn or indigestion.     . gabapentin (NEURONTIN) 100 MG capsule Take 1 capsule (100 mg total) by mouth 3 (three) times daily. (Patient taking differently: Take 100 mg by mouth 3 (three) times daily as needed (for legs pain). ) 90 capsule 2  . levalbuterol (XOPENEX HFA) 45 MCG/ACT inhaler Inhale 1 puff into the lungs every 4 (four)  hours as needed for wheezing. For COPD ICD 10: J44. 9 For A fib ICD: 10 I48. 9 1 Inhaler 2  . Multiple Vitamin (MULTIVITAMIN WITH MINERALS) TABS tablet Take 1 tablet by mouth at bedtime.     Marland Kitchen omeprazole (PRILOSEC) 20 MG capsule Take 20 mg by mouth daily as needed (heartburn or indigestion).     . predniSONE (DELTASONE) 10 MG tablet 66m, 2 daily until all better, then 1 daily x's 5 days, and stop 100 tablet 0  . sodium chloride (OCEAN) 0.65 % SOLN nasal spray Place 1 spray into both nostrils as needed for congestion. 1 Bottle 0  . SPIRIVA RESPIMAT 2.5 MCG/ACT AERS INHALE 2 PUFFS INTO THE LUNGS DAILY (Patient taking differently: Inhale 2 puffs into the lungs daily. ) 1 Inhaler 11  . SYMBICORT 160-4.5 MCG/ACT inhaler INHALE 2 PUFFS INTO THE LUNGS TWICE DAILY 10.2 g 11  . traMADol (ULTRAM) 50 MG tablet Take 1 tablet (50 mg total) by mouth every 6 (six) hours as needed for moderate pain. 20 tablet 0  . Vitamins A & D (VITAMIN A & D) ointment Apply 1 application topically as needed for dry skin.     No current facility-administered medications for this visit.     ROS: See HPI for pertinent positives and negatives.   Physical Examination  Vitals:   06/02/19 1259 06/02/19 1303  BP: (!) 143/64 (!) 143/60  Pulse: 82   Resp: 16   Temp: 97.6 F (36.4 C)   TempSrc: Temporal   SpO2: 94%   Weight: 108 lb (49 kg)   Height: 5' 1"  (1.549 m)    Body mass index is 20.41 kg/m.  General: A&O x 3, WDWN, petite female. Gait: seated in her w/c HENT: No gross abnormalities.  Eyes: Pupils are equal Pulmonary: Respirations are non labored, limited air movement in all fields, no rales, rhonchi, or wheezes. Cardiac: regular rhythm, no detected murmur.         Carotid Bruits Right Left   Negative Negative   Radial pulses are 1+ palpable bilaterally   Adominal aortic pulse is not palpable                         VASCULAR EXAM: Extremities with ischemic changes (ruddy and cool bilateral forefeet),  cool bilateral lower legs, without Gangrene; without open wounds.  LE Pulses Right Left       FEMORAL  not palpable  1+ palpable        POPLITEAL  not palpable   not palpable       POSTERIOR TIBIAL  not palpable   not palpable        DORSALIS PEDIS      ANTERIOR TIBIAL not palpable  not palpable    Abdomen: soft, NT, no palpable masses. Skin: no rashes, no cellulitis, no ulcers noted. See Extremities.  Musculoskeletal: no muscle wasting or atrophy.  Neurologic: A&O X 3; appropriate affect, Sensation is normal; MOTOR FUNCTION:  moving all extremities equally, motor strength 4/5 in upper extremities, 3/5 in lower extremites. Speech is fluent/normal. CN 2-12 intact except has significant hearing loss. Psychiatric: Thought content is normal, mood appropriate for clinical situation.     ASSESSMENT: Carla Dawson is a 85 y.o. female who is s/p left iliofemoral endarterectomy with patch angioplasty as well as a stent within her left popliteal artery on 01-06-19 by Dr. Trula Slade. She lost Doppler signals in the recovery room and was taken back to the operating room for thrombectomy of her left femoral-popliteal bypass graft.  Prior to this she had several revascularization procedures for both lower extremities.   On ABI's yesterday: absent DP and PT pulses bilaterally. Fortunately there are no open wounds, no gangrene in both feet and legs. Both forefeet are ruddy and cool, both lower legs are cool. Capillary refill in toes of both feet is >5 seconds.    She has rest pain in both calves, left worse than right, wakes her at night. She takes tramadol for this pain, which helps, but not sufficiently. See Plan.   DATA  ABI (Date: 06-01-19): ABI Findings: +---------+------------------+-----+--------+--------+ Right    Rt Pressure (mmHg)IndexWaveformComment   +---------+------------------+-----+--------+--------+ Brachial 159                                     +---------+------------------+-----+--------+--------+ PTA                             absent           +---------+------------------+-----+--------+--------+ DP                              absent           +---------+------------------+-----+--------+--------+ Great Toe                       absent           +---------+------------------+-----+--------+--------+  +---------+------------------+-----+--------+-------+ Left     Lt Pressure (mmHg)IndexWaveformComment +---------+------------------+-----+--------+-------+ Brachial 185                                    +---------+------------------+-----+--------+-------+ PTA                             absent          +---------+------------------+-----+--------+-------+ DP                              absent          +---------+------------------+-----+--------+-------+  Great Toe                       absent          +---------+------------------+-----+--------+-------+  +-------+-----------+-----------+------------+------------+ ABI/TBIToday's ABIToday's TBIPrevious ABIPrevious TBI +-------+-----------+-----------+------------+------------+ Right  absent     absent     absent                   +-------+-----------+-----------+------------+------------+ Left   absent     absent     0.72                     +-------+-----------+-----------+------------+------------+  Bilateral ABIs appear decreased compared to prior study on 03/04/2019. Summary: Right: Resting right ankle-brachial index indicates critical limb ischemia.  Posterior tibial and dorsalis pedis artery waveforms could not be detected with either 68mz or 835m probes. Left: Resting left ankle-brachial index indicates critical left limb ischemia. Posterior tibial and dorsalis pedis artery  waveforms could not be detected with either 97m73mor 8mH2mrobes.    PLAN:  She is having rest pain in both calves, she requested a refill on her tramadol which is helping with her rest pain, but not sufficiently. She may also take 500 mg acetaminophen every 8 hours. Tramadol 50 mg, 1 po q6h prn pain, disp #20, 0 refills prescribed.   After discussing with Dr. FielOneida Alar to return on Monday 06-07-19 and discuss bilateral BKA or AKA with Dr. BrabTrula Sladed pt daughter MyraJuliene PinaI discussed in depth with the patient the nature of atherosclerosis, and emphasized the importance of maximal medical management including strict control of blood pressure, blood glucose, and lipid levels, obtaining regular exercise, and continued cessation of smoking.  The patient is aware that without maximal medical management the underlying atherosclerotic disease process will progress, limiting the benefit of any interventions.  The patient was given information about PAD including signs, symptoms, treatment, what symptoms should prompt the patient to seek immediate medical care, and risk reduction measures to take.  SuzaClemon Chambers, MSN, FNP-C [facility] [employer]: [phone] MD: DickLaqueta Due/24/20 1:13 PM

## 2019-06-02 NOTE — Patient Instructions (Signed)
Peripheral Vascular Disease  Peripheral vascular disease (PVD) is a disease of the blood vessels that are not part of your heart and brain. A simple term for PVD is poor circulation. In most cases, PVD narrows the blood vessels that carry blood from your heart to the rest of your body. This can reduce the supply of blood to your arms, legs, and internal organs, like your stomach or kidneys. However, PVD most often affects a person's lower legs and feet. Without treatment, PVD tends to get worse. PVD can also lead to acute ischemic limb. This is when an arm or leg suddenly cannot get enough blood. This is a medical emergency. Follow these instructions at home: Lifestyle  Do not use any products that contain nicotine or tobacco, such as cigarettes and e-cigarettes. If you need help quitting, ask your doctor.  Lose weight if you are overweight. Or, stay at a healthy weight as told by your doctor.  Eat a diet that is low in fat and cholesterol. If you need help, ask your doctor.  Exercise regularly. Ask your doctor for activities that are right for you. General instructions  Take over-the-counter and prescription medicines only as told by your doctor.  Take good care of your feet: ? Wear comfortable shoes that fit well. ? Check your feet often for any cuts or sores.  Keep all follow-up visits as told by your doctor This is important. Contact a doctor if:  You have cramps in your legs when you walk.  You have leg pain when you are at rest.  You have coldness in a leg or foot.  Your skin changes.  You are unable to get or have an erection (erectile dysfunction).  You have cuts or sores on your feet that do not heal. Get help right away if:  Your arm or leg turns cold, numb, and blue.  Your arms or legs become red, warm, swollen, painful, or numb.  You have chest pain.  You have trouble breathing.  You suddenly have weakness in your face, arm, or leg.  You become very  confused or you cannot speak.  You suddenly have a very bad headache.  You suddenly cannot see. Summary  Peripheral vascular disease (PVD) is a disease of the blood vessels.  A simple term for PVD is poor circulation. Without treatment, PVD tends to get worse.  Treatment may include exercise, low fat and low cholesterol diet, and quitting smoking. This information is not intended to replace advice given to you by your health care provider. Make sure you discuss any questions you have with your health care provider. Document Released: 02/19/2010 Document Revised: 01/02/2017 Document Reviewed: 01/02/2017 Elsevier Interactive Patient Education  2019 Elsevier Inc.  

## 2019-06-02 NOTE — Telephone Encounter (Signed)
Per Dr. Trula Slade, he spoke with Mrs. Dusenbury daughter, [name] regarding the patient's prognosis and next steps.  He relayed that the patient's next surgical option is amputation.  The daughter wishes for the surgery to be performed in [place] by the patient's surgeon, there, as that is where her support system is located.    I spoke with Myriah at the office of Dr. Blima Ledger [facility] [phone]), a vascular surgeon who the patient saw last in 2017.  The patient is scheduled for an appointment with Dr. Anette Guarneri on Monday, June 29 at 11:15 am in his office at [address] [place] [address].  Records will be released to [facility] at fax [phone] by Vaughan Basta.  [name] is aware of the appointment, office address, and to arrive early to fill out new patient paperwork.  She requested a letter regarding this situation so she can receive a refund on a beach trip that was supposed to start on Monday, 6/30.  I will create that letter and put it at the front desk for pick-up tomorrow.  Ovidio Hanger

## 2019-06-02 NOTE — Progress Notes (Signed)
Encounter opened in error - see telephone encounter on 06/02/19.

## 2019-06-03 ENCOUNTER — Telehealth: Payer: Self-pay | Admitting: Internal Medicine

## 2019-06-03 NOTE — Telephone Encounter (Signed)
Pt daughter is returning call. Per Pt daughter, if she does not answer please leave rec's on VM. Cb is [phone]

## 2019-06-03 NOTE — Telephone Encounter (Signed)
LMTCB

## 2019-06-03 NOTE — Telephone Encounter (Signed)
Call returned to patient daughter, made aware of MW recommendations. She states she does not have time right now to find a PCP but she will call us back once they figured this out. Voiced understanding. Nothing further is needed at this time.

## 2019-06-03 NOTE — Telephone Encounter (Signed)
Called and spoke with [name], Patient's Daughter.  [name] stated Patient was going to have a possible amputation.  [name] stated Patient is moving to [place], [place]. [name] stated Patient is from that area, and has many family members in that area to care for her.  [name] would like Dr Melvyn Novas to advise on a new pulmonary provider in that area.  [name] would also like to extend thanks to Dr Melvyn Novas for the care he has provided.  Message routed to Dr Melvyn Novas, to advise on pulmonary provider in [place], [place]

## 2019-06-03 NOTE — Telephone Encounter (Signed)
I do not have any contacts in [place] but my advice is to first decide who her primary care provider will be or what system she will be using for her care so that all the doctors can communicate like we do by computer without having to fax records.  Any board-certified pulmonary doctor should do as her problem is extremely common and of course I be happy to send all the records she needs to assist in her ongoing care.  Wish her my best.

## 2019-06-04 ENCOUNTER — Telehealth: Payer: Self-pay | Admitting: *Deleted

## 2019-06-04 NOTE — Telephone Encounter (Signed)
Call again from patient's daughter requesting additional Tramadol. She states Dr. Trula Slade said they could double up on the Tramadol and if they do that patient would not have enough to get to doctor in [place] on Friday. They also have an Rx for Oxycodone. Patient does not want to take the Oxycodone. After consulting with provider. No additional Tramadol. To take Tramadol 50 with Tylenol for moderate pain and double for severe pain only. Due to patient's age and medical history careful consideration when taking any Narcotic should be considered and balance safety with pain relief.

## 2019-06-07 DIAGNOSIS — I70213 Atherosclerosis of native arteries of extremities with intermittent claudication, bilateral legs: Secondary | ICD-10-CM | POA: Diagnosis not present

## 2019-06-07 DIAGNOSIS — I70223 Atherosclerosis of native arteries of extremities with rest pain, bilateral legs: Secondary | ICD-10-CM | POA: Diagnosis not present

## 2019-06-08 DIAGNOSIS — Z01812 Encounter for preprocedural laboratory examination: Secondary | ICD-10-CM | POA: Diagnosis not present

## 2019-06-08 DIAGNOSIS — I70223 Atherosclerosis of native arteries of extremities with rest pain, bilateral legs: Secondary | ICD-10-CM | POA: Diagnosis not present

## 2019-06-08 DIAGNOSIS — Z7901 Long term (current) use of anticoagulants: Secondary | ICD-10-CM | POA: Diagnosis not present

## 2019-06-08 DIAGNOSIS — Z7902 Long term (current) use of antithrombotics/antiplatelets: Secondary | ICD-10-CM | POA: Diagnosis not present

## 2019-06-15 DIAGNOSIS — I70223 Atherosclerosis of native arteries of extremities with rest pain, bilateral legs: Secondary | ICD-10-CM | POA: Diagnosis not present

## 2019-06-15 DIAGNOSIS — I2510 Atherosclerotic heart disease of native coronary artery without angina pectoris: Secondary | ICD-10-CM | POA: Diagnosis not present

## 2019-06-15 DIAGNOSIS — I70213 Atherosclerosis of native arteries of extremities with intermittent claudication, bilateral legs: Secondary | ICD-10-CM | POA: Diagnosis not present

## 2019-06-15 DIAGNOSIS — I10 Essential (primary) hypertension: Secondary | ICD-10-CM | POA: Diagnosis not present

## 2019-06-15 DIAGNOSIS — J449 Chronic obstructive pulmonary disease, unspecified: Secondary | ICD-10-CM | POA: Diagnosis not present

## 2019-06-21 ENCOUNTER — Ambulatory Visit: Payer: Medicare Other | Admitting: Internal Medicine

## 2019-06-26 DIAGNOSIS — Z9981 Dependence on supplemental oxygen: Secondary | ICD-10-CM | POA: Diagnosis not present

## 2019-06-26 DIAGNOSIS — E46 Unspecified protein-calorie malnutrition: Secondary | ICD-10-CM | POA: Diagnosis not present

## 2019-06-26 DIAGNOSIS — I771 Stricture of artery: Secondary | ICD-10-CM | POA: Diagnosis not present

## 2019-06-26 DIAGNOSIS — Z48812 Encounter for surgical aftercare following surgery on the circulatory system: Secondary | ICD-10-CM | POA: Diagnosis not present

## 2019-06-26 DIAGNOSIS — D649 Anemia, unspecified: Secondary | ICD-10-CM | POA: Diagnosis not present

## 2019-06-26 DIAGNOSIS — I4891 Unspecified atrial fibrillation: Secondary | ICD-10-CM | POA: Diagnosis present

## 2019-06-26 DIAGNOSIS — I998 Other disorder of circulatory system: Secondary | ICD-10-CM | POA: Diagnosis present

## 2019-06-26 DIAGNOSIS — E785 Hyperlipidemia, unspecified: Secondary | ICD-10-CM | POA: Diagnosis not present

## 2019-06-26 DIAGNOSIS — I70222 Atherosclerosis of native arteries of extremities with rest pain, left leg: Secondary | ICD-10-CM | POA: Diagnosis not present

## 2019-06-26 DIAGNOSIS — R531 Weakness: Secondary | ICD-10-CM | POA: Diagnosis not present

## 2019-06-26 DIAGNOSIS — Z800 Family history of malignant neoplasm of digestive organs: Secondary | ICD-10-CM | POA: Diagnosis not present

## 2019-06-26 DIAGNOSIS — Z888 Allergy status to other drugs, medicaments and biological substances status: Secondary | ICD-10-CM | POA: Diagnosis not present

## 2019-06-26 DIAGNOSIS — A419 Sepsis, unspecified organism: Secondary | ICD-10-CM | POA: Diagnosis not present

## 2019-06-26 DIAGNOSIS — Z7982 Long term (current) use of aspirin: Secondary | ICD-10-CM | POA: Diagnosis not present

## 2019-06-26 DIAGNOSIS — L03116 Cellulitis of left lower limb: Secondary | ICD-10-CM | POA: Diagnosis not present

## 2019-06-26 DIAGNOSIS — M79605 Pain in left leg: Secondary | ICD-10-CM | POA: Diagnosis not present

## 2019-06-26 DIAGNOSIS — M6281 Muscle weakness (generalized): Secondary | ICD-10-CM | POA: Diagnosis not present

## 2019-06-26 DIAGNOSIS — J189 Pneumonia, unspecified organism: Secondary | ICD-10-CM | POA: Diagnosis not present

## 2019-06-26 DIAGNOSIS — D473 Essential (hemorrhagic) thrombocythemia: Secondary | ICD-10-CM | POA: Diagnosis present

## 2019-06-26 DIAGNOSIS — I743 Embolism and thrombosis of arteries of the lower extremities: Secondary | ICD-10-CM | POA: Diagnosis not present

## 2019-06-26 DIAGNOSIS — A4189 Other specified sepsis: Secondary | ICD-10-CM | POA: Diagnosis not present

## 2019-06-26 DIAGNOSIS — R6889 Other general symptoms and signs: Secondary | ICD-10-CM | POA: Diagnosis not present

## 2019-06-26 DIAGNOSIS — Z7951 Long term (current) use of inhaled steroids: Secondary | ICD-10-CM | POA: Diagnosis not present

## 2019-06-26 DIAGNOSIS — J449 Chronic obstructive pulmonary disease, unspecified: Secondary | ICD-10-CM | POA: Diagnosis present

## 2019-06-26 DIAGNOSIS — I2510 Atherosclerotic heart disease of native coronary artery without angina pectoris: Secondary | ICD-10-CM | POA: Diagnosis present

## 2019-06-26 DIAGNOSIS — R262 Difficulty in walking, not elsewhere classified: Secondary | ICD-10-CM | POA: Diagnosis not present

## 2019-06-26 DIAGNOSIS — D72829 Elevated white blood cell count, unspecified: Secondary | ICD-10-CM | POA: Diagnosis not present

## 2019-06-26 DIAGNOSIS — E876 Hypokalemia: Secondary | ICD-10-CM | POA: Diagnosis present

## 2019-06-26 DIAGNOSIS — D509 Iron deficiency anemia, unspecified: Secondary | ICD-10-CM | POA: Diagnosis present

## 2019-06-26 DIAGNOSIS — Z5189 Encounter for other specified aftercare: Secondary | ICD-10-CM | POA: Diagnosis not present

## 2019-06-26 DIAGNOSIS — T82868A Thrombosis of vascular prosthetic devices, implants and grafts, initial encounter: Secondary | ICD-10-CM | POA: Diagnosis not present

## 2019-06-26 DIAGNOSIS — I739 Peripheral vascular disease, unspecified: Secondary | ICD-10-CM | POA: Diagnosis not present

## 2019-06-26 DIAGNOSIS — D696 Thrombocytopenia, unspecified: Secondary | ICD-10-CM | POA: Diagnosis not present

## 2019-06-26 DIAGNOSIS — I10 Essential (primary) hypertension: Secondary | ICD-10-CM | POA: Diagnosis not present

## 2019-06-26 DIAGNOSIS — Z20828 Contact with and (suspected) exposure to other viral communicable diseases: Secondary | ICD-10-CM | POA: Diagnosis present

## 2019-06-29 IMAGING — CR DG CHEST 2V
2 series · 2 of 2 positions shown · non-contrast
Comparison: Chest x-ray 11/25/2018.

CLINICAL DATA: 87-year-old female with history of cough since
yesterday evening and this morning.

EXAM:
CHEST - 2 VIEW

[chest lat]
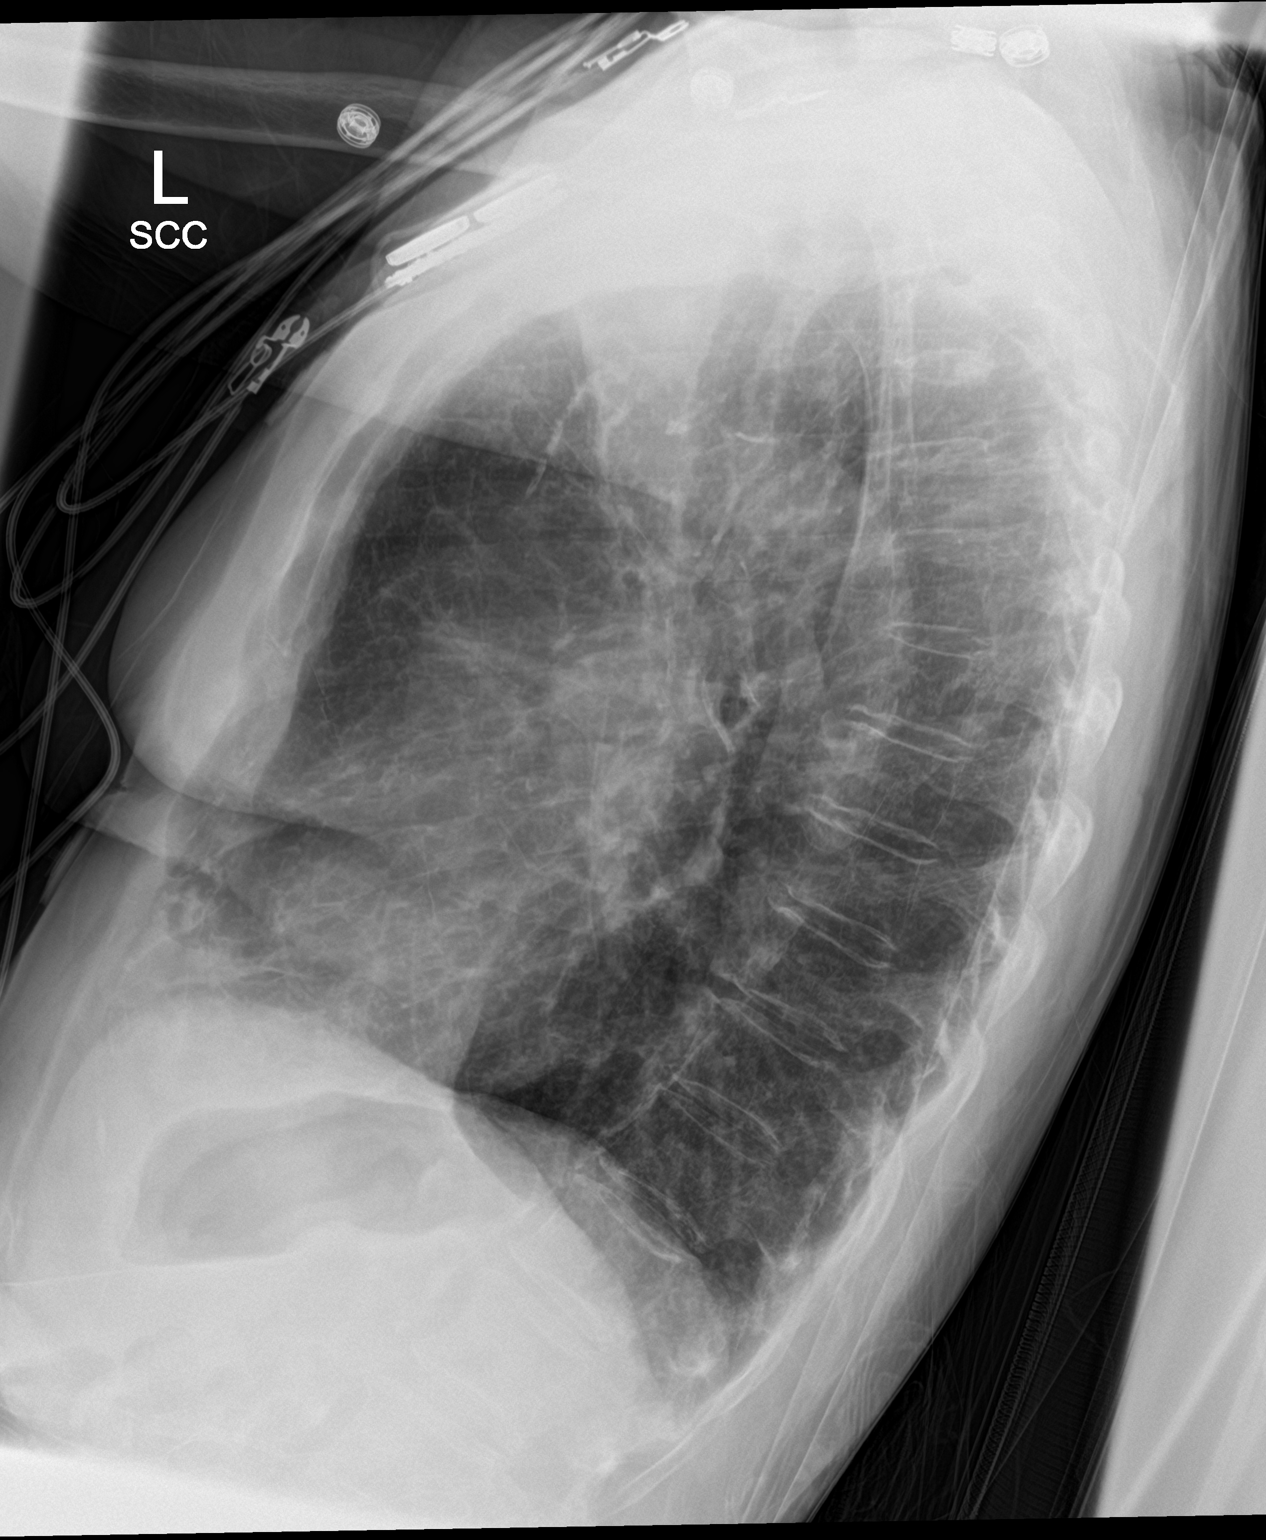

[chest ap]
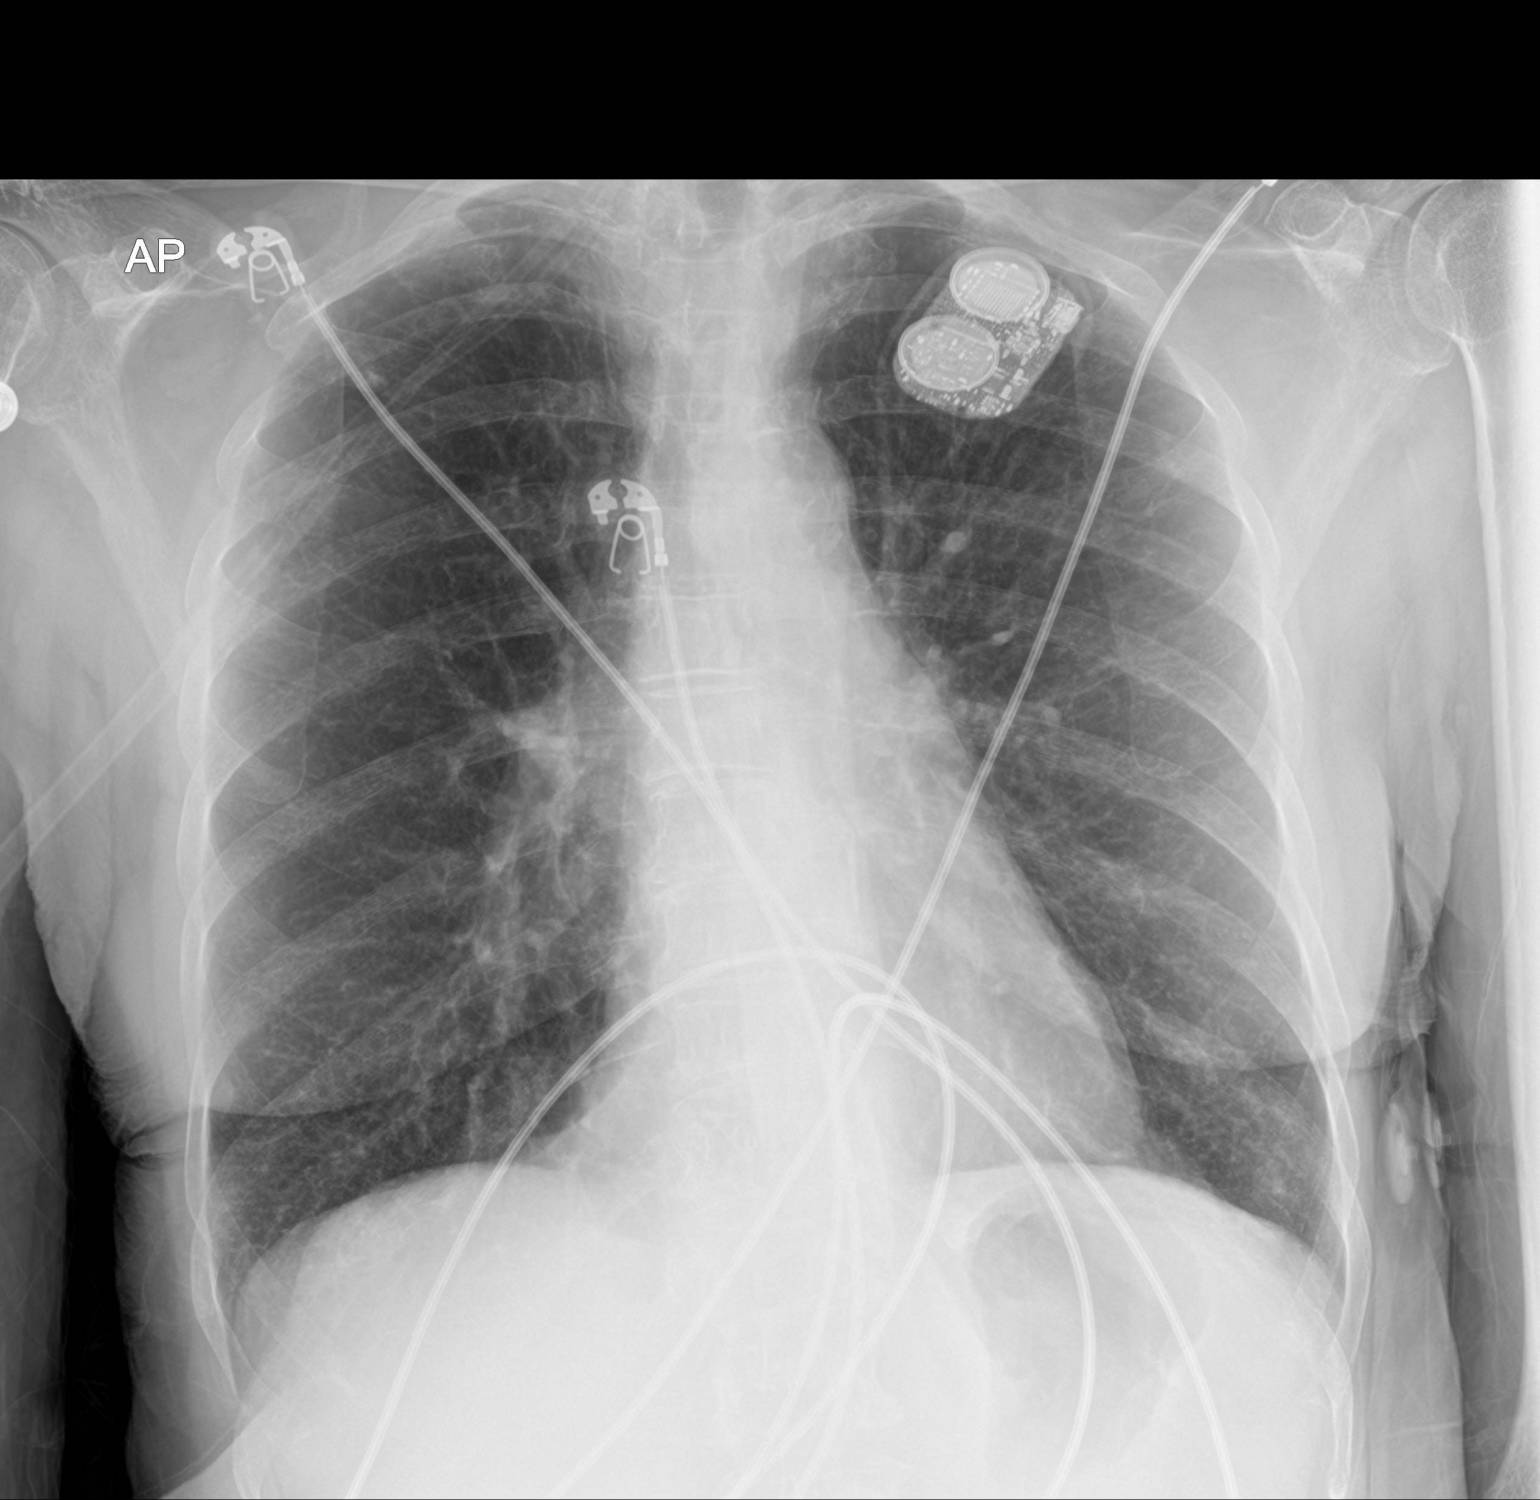

[2 of 2 positions shown; findings below may reference images not displayed]

FINDINGS: Lung volumes are slightly increased with emphysematous changes. No
consolidative airspace disease. No pleural effusions. No
pneumothorax. No pulmonary nodule or mass noted. Pulmonary
vasculature and the cardiomediastinal silhouette are within normal
limits. Atherosclerosis in the thoracic aorta. Electronic device
projecting over the upper left hemithorax again noted.
IMPRESSION: 1. No radiographic evidence of acute cardiopulmonary disease.
2. Aortic atherosclerosis.
3. Emphysema.

## 2019-07-03 DIAGNOSIS — Z79899 Other long term (current) drug therapy: Secondary | ICD-10-CM | POA: Diagnosis not present

## 2019-07-03 DIAGNOSIS — I998 Other disorder of circulatory system: Secondary | ICD-10-CM | POA: Diagnosis present

## 2019-07-03 DIAGNOSIS — D696 Thrombocytopenia, unspecified: Secondary | ICD-10-CM | POA: Diagnosis not present

## 2019-07-03 DIAGNOSIS — E785 Hyperlipidemia, unspecified: Secondary | ICD-10-CM | POA: Diagnosis not present

## 2019-07-03 DIAGNOSIS — D72829 Elevated white blood cell count, unspecified: Secondary | ICD-10-CM | POA: Diagnosis not present

## 2019-07-03 DIAGNOSIS — I70213 Atherosclerosis of native arteries of extremities with intermittent claudication, bilateral legs: Secondary | ICD-10-CM | POA: Diagnosis not present

## 2019-07-03 DIAGNOSIS — R531 Weakness: Secondary | ICD-10-CM | POA: Diagnosis not present

## 2019-07-03 DIAGNOSIS — I10 Essential (primary) hypertension: Secondary | ICD-10-CM | POA: Diagnosis present

## 2019-07-03 DIAGNOSIS — L03116 Cellulitis of left lower limb: Secondary | ICD-10-CM | POA: Diagnosis not present

## 2019-07-03 DIAGNOSIS — Z48812 Encounter for surgical aftercare following surgery on the circulatory system: Secondary | ICD-10-CM | POA: Diagnosis not present

## 2019-07-03 DIAGNOSIS — Z87891 Personal history of nicotine dependence: Secondary | ICD-10-CM | POA: Diagnosis not present

## 2019-07-03 DIAGNOSIS — D649 Anemia, unspecified: Secondary | ICD-10-CM | POA: Diagnosis present

## 2019-07-03 DIAGNOSIS — A4189 Other specified sepsis: Secondary | ICD-10-CM | POA: Diagnosis not present

## 2019-07-03 DIAGNOSIS — I70262 Atherosclerosis of native arteries of extremities with gangrene, left leg: Secondary | ICD-10-CM | POA: Diagnosis not present

## 2019-07-03 DIAGNOSIS — D62 Acute posthemorrhagic anemia: Secondary | ICD-10-CM | POA: Diagnosis not present

## 2019-07-03 DIAGNOSIS — I96 Gangrene, not elsewhere classified: Secondary | ICD-10-CM | POA: Diagnosis not present

## 2019-07-03 DIAGNOSIS — Z5189 Encounter for other specified aftercare: Secondary | ICD-10-CM | POA: Diagnosis not present

## 2019-07-03 DIAGNOSIS — Z7951 Long term (current) use of inhaled steroids: Secondary | ICD-10-CM | POA: Diagnosis not present

## 2019-07-03 DIAGNOSIS — A419 Sepsis, unspecified organism: Secondary | ICD-10-CM | POA: Diagnosis not present

## 2019-07-03 DIAGNOSIS — Z7982 Long term (current) use of aspirin: Secondary | ICD-10-CM | POA: Diagnosis not present

## 2019-07-03 DIAGNOSIS — E46 Unspecified protein-calorie malnutrition: Secondary | ICD-10-CM | POA: Diagnosis not present

## 2019-07-03 DIAGNOSIS — I2510 Atherosclerotic heart disease of native coronary artery without angina pectoris: Secondary | ICD-10-CM | POA: Diagnosis present

## 2019-07-03 DIAGNOSIS — Z7902 Long term (current) use of antithrombotics/antiplatelets: Secondary | ICD-10-CM | POA: Diagnosis not present

## 2019-07-03 DIAGNOSIS — J449 Chronic obstructive pulmonary disease, unspecified: Secondary | ICD-10-CM | POA: Diagnosis present

## 2019-07-03 DIAGNOSIS — I739 Peripheral vascular disease, unspecified: Secondary | ICD-10-CM | POA: Diagnosis present

## 2019-07-03 DIAGNOSIS — I7092 Chronic total occlusion of artery of the extremities: Secondary | ICD-10-CM | POA: Diagnosis not present

## 2019-07-03 DIAGNOSIS — R262 Difficulty in walking, not elsewhere classified: Secondary | ICD-10-CM | POA: Diagnosis not present

## 2019-07-03 DIAGNOSIS — Z885 Allergy status to narcotic agent status: Secondary | ICD-10-CM | POA: Diagnosis not present

## 2019-07-03 DIAGNOSIS — M6281 Muscle weakness (generalized): Secondary | ICD-10-CM | POA: Diagnosis not present

## 2019-07-03 DIAGNOSIS — M79605 Pain in left leg: Secondary | ICD-10-CM | POA: Diagnosis not present

## 2019-07-03 DIAGNOSIS — I70223 Atherosclerosis of native arteries of extremities with rest pain, bilateral legs: Secondary | ICD-10-CM | POA: Diagnosis not present

## 2019-07-05 DIAGNOSIS — I739 Peripheral vascular disease, unspecified: Secondary | ICD-10-CM | POA: Diagnosis not present

## 2019-07-05 DIAGNOSIS — I7092 Chronic total occlusion of artery of the extremities: Secondary | ICD-10-CM | POA: Diagnosis not present

## 2019-07-05 DIAGNOSIS — I10 Essential (primary) hypertension: Secondary | ICD-10-CM | POA: Diagnosis not present

## 2019-07-05 DIAGNOSIS — M6281 Muscle weakness (generalized): Secondary | ICD-10-CM | POA: Diagnosis not present

## 2019-07-05 DIAGNOSIS — L03116 Cellulitis of left lower limb: Secondary | ICD-10-CM | POA: Diagnosis not present

## 2019-07-07 DIAGNOSIS — L03116 Cellulitis of left lower limb: Secondary | ICD-10-CM | POA: Diagnosis not present

## 2019-07-07 DIAGNOSIS — I70223 Atherosclerosis of native arteries of extremities with rest pain, bilateral legs: Secondary | ICD-10-CM | POA: Diagnosis not present

## 2019-07-07 DIAGNOSIS — I70213 Atherosclerosis of native arteries of extremities with intermittent claudication, bilateral legs: Secondary | ICD-10-CM | POA: Diagnosis not present

## 2019-07-07 DIAGNOSIS — I96 Gangrene, not elsewhere classified: Secondary | ICD-10-CM | POA: Diagnosis not present

## 2019-07-08 DIAGNOSIS — I739 Peripheral vascular disease, unspecified: Secondary | ICD-10-CM | POA: Diagnosis present

## 2019-07-08 DIAGNOSIS — L03116 Cellulitis of left lower limb: Secondary | ICD-10-CM | POA: Diagnosis not present

## 2019-07-08 DIAGNOSIS — D649 Anemia, unspecified: Secondary | ICD-10-CM | POA: Diagnosis present

## 2019-07-08 DIAGNOSIS — I96 Gangrene, not elsewhere classified: Secondary | ICD-10-CM | POA: Diagnosis not present

## 2019-07-08 DIAGNOSIS — I70262 Atherosclerosis of native arteries of extremities with gangrene, left leg: Secondary | ICD-10-CM | POA: Diagnosis not present

## 2019-07-08 DIAGNOSIS — M6281 Muscle weakness (generalized): Secondary | ICD-10-CM | POA: Diagnosis not present

## 2019-07-08 DIAGNOSIS — Z5189 Encounter for other specified aftercare: Secondary | ICD-10-CM | POA: Diagnosis not present

## 2019-07-08 DIAGNOSIS — A419 Sepsis, unspecified organism: Secondary | ICD-10-CM | POA: Diagnosis not present

## 2019-07-08 DIAGNOSIS — Z7951 Long term (current) use of inhaled steroids: Secondary | ICD-10-CM | POA: Diagnosis not present

## 2019-07-08 DIAGNOSIS — D72829 Elevated white blood cell count, unspecified: Secondary | ICD-10-CM | POA: Diagnosis not present

## 2019-07-08 DIAGNOSIS — I10 Essential (primary) hypertension: Secondary | ICD-10-CM | POA: Diagnosis present

## 2019-07-08 DIAGNOSIS — Z89512 Acquired absence of left leg below knee: Secondary | ICD-10-CM | POA: Diagnosis not present

## 2019-07-08 DIAGNOSIS — Z7982 Long term (current) use of aspirin: Secondary | ICD-10-CM | POA: Diagnosis not present

## 2019-07-08 DIAGNOSIS — R262 Difficulty in walking, not elsewhere classified: Secondary | ICD-10-CM | POA: Diagnosis not present

## 2019-07-08 DIAGNOSIS — Z7902 Long term (current) use of antithrombotics/antiplatelets: Secondary | ICD-10-CM | POA: Diagnosis not present

## 2019-07-08 DIAGNOSIS — E46 Unspecified protein-calorie malnutrition: Secondary | ICD-10-CM | POA: Diagnosis not present

## 2019-07-08 DIAGNOSIS — E785 Hyperlipidemia, unspecified: Secondary | ICD-10-CM | POA: Diagnosis not present

## 2019-07-08 DIAGNOSIS — I998 Other disorder of circulatory system: Secondary | ICD-10-CM | POA: Diagnosis present

## 2019-07-08 DIAGNOSIS — D696 Thrombocytopenia, unspecified: Secondary | ICD-10-CM | POA: Diagnosis not present

## 2019-07-08 DIAGNOSIS — Z87891 Personal history of nicotine dependence: Secondary | ICD-10-CM | POA: Diagnosis not present

## 2019-07-08 DIAGNOSIS — Z4781 Encounter for orthopedic aftercare following surgical amputation: Secondary | ICD-10-CM | POA: Diagnosis not present

## 2019-07-08 DIAGNOSIS — Z885 Allergy status to narcotic agent status: Secondary | ICD-10-CM | POA: Diagnosis not present

## 2019-07-08 DIAGNOSIS — J449 Chronic obstructive pulmonary disease, unspecified: Secondary | ICD-10-CM | POA: Diagnosis present

## 2019-07-08 DIAGNOSIS — Z48812 Encounter for surgical aftercare following surgery on the circulatory system: Secondary | ICD-10-CM | POA: Diagnosis not present

## 2019-07-08 DIAGNOSIS — D62 Acute posthemorrhagic anemia: Secondary | ICD-10-CM | POA: Diagnosis not present

## 2019-07-08 DIAGNOSIS — Z79899 Other long term (current) drug therapy: Secondary | ICD-10-CM | POA: Diagnosis not present

## 2019-07-08 DIAGNOSIS — R531 Weakness: Secondary | ICD-10-CM | POA: Diagnosis not present

## 2019-07-08 DIAGNOSIS — I2510 Atherosclerotic heart disease of native coronary artery without angina pectoris: Secondary | ICD-10-CM | POA: Diagnosis present

## 2019-07-08 DIAGNOSIS — M79605 Pain in left leg: Secondary | ICD-10-CM | POA: Diagnosis not present

## 2019-07-08 DIAGNOSIS — G546 Phantom limb syndrome with pain: Secondary | ICD-10-CM | POA: Diagnosis not present

## 2019-07-12 DIAGNOSIS — J449 Chronic obstructive pulmonary disease, unspecified: Secondary | ICD-10-CM | POA: Diagnosis not present

## 2019-07-12 DIAGNOSIS — G546 Phantom limb syndrome with pain: Secondary | ICD-10-CM | POA: Diagnosis not present

## 2019-07-12 DIAGNOSIS — R630 Anorexia: Secondary | ICD-10-CM | POA: Diagnosis not present

## 2019-07-12 DIAGNOSIS — S88112D Complete traumatic amputation at level between knee and ankle, left lower leg, subsequent encounter: Secondary | ICD-10-CM | POA: Diagnosis not present

## 2019-07-12 DIAGNOSIS — E876 Hypokalemia: Secondary | ICD-10-CM | POA: Diagnosis not present

## 2019-07-12 DIAGNOSIS — R600 Localized edema: Secondary | ICD-10-CM | POA: Diagnosis not present

## 2019-07-12 DIAGNOSIS — E785 Hyperlipidemia, unspecified: Secondary | ICD-10-CM | POA: Diagnosis not present

## 2019-07-12 DIAGNOSIS — I70223 Atherosclerosis of native arteries of extremities with rest pain, bilateral legs: Secondary | ICD-10-CM | POA: Diagnosis not present

## 2019-07-12 DIAGNOSIS — I519 Heart disease, unspecified: Secondary | ICD-10-CM | POA: Diagnosis not present

## 2019-07-12 DIAGNOSIS — R262 Difficulty in walking, not elsewhere classified: Secondary | ICD-10-CM | POA: Diagnosis not present

## 2019-07-12 DIAGNOSIS — Z23 Encounter for immunization: Secondary | ICD-10-CM | POA: Diagnosis not present

## 2019-07-12 DIAGNOSIS — D649 Anemia, unspecified: Secondary | ICD-10-CM | POA: Diagnosis not present

## 2019-07-12 DIAGNOSIS — D696 Thrombocytopenia, unspecified: Secondary | ICD-10-CM | POA: Diagnosis not present

## 2019-07-12 DIAGNOSIS — I70213 Atherosclerosis of native arteries of extremities with intermittent claudication, bilateral legs: Secondary | ICD-10-CM | POA: Diagnosis not present

## 2019-07-12 DIAGNOSIS — I2510 Atherosclerotic heart disease of native coronary artery without angina pectoris: Secondary | ICD-10-CM | POA: Diagnosis not present

## 2019-07-12 DIAGNOSIS — I739 Peripheral vascular disease, unspecified: Secondary | ICD-10-CM | POA: Diagnosis not present

## 2019-07-12 DIAGNOSIS — Z48812 Encounter for surgical aftercare following surgery on the circulatory system: Secondary | ICD-10-CM | POA: Diagnosis not present

## 2019-07-12 DIAGNOSIS — I998 Other disorder of circulatory system: Secondary | ICD-10-CM | POA: Diagnosis not present

## 2019-07-12 DIAGNOSIS — E46 Unspecified protein-calorie malnutrition: Secondary | ICD-10-CM | POA: Diagnosis not present

## 2019-07-12 DIAGNOSIS — Z4781 Encounter for orthopedic aftercare following surgical amputation: Secondary | ICD-10-CM | POA: Diagnosis not present

## 2019-07-12 DIAGNOSIS — I70262 Atherosclerosis of native arteries of extremities with gangrene, left leg: Secondary | ICD-10-CM | POA: Diagnosis not present

## 2019-07-12 DIAGNOSIS — Z87891 Personal history of nicotine dependence: Secondary | ICD-10-CM | POA: Diagnosis not present

## 2019-07-12 DIAGNOSIS — R531 Weakness: Secondary | ICD-10-CM | POA: Diagnosis not present

## 2019-07-12 DIAGNOSIS — E559 Vitamin D deficiency, unspecified: Secondary | ICD-10-CM | POA: Diagnosis not present

## 2019-07-12 DIAGNOSIS — L03116 Cellulitis of left lower limb: Secondary | ICD-10-CM | POA: Diagnosis not present

## 2019-07-12 DIAGNOSIS — M6281 Muscle weakness (generalized): Secondary | ICD-10-CM | POA: Diagnosis not present

## 2019-07-12 DIAGNOSIS — Z89512 Acquired absence of left leg below knee: Secondary | ICD-10-CM | POA: Diagnosis not present

## 2019-07-12 DIAGNOSIS — A419 Sepsis, unspecified organism: Secondary | ICD-10-CM | POA: Diagnosis not present

## 2019-07-12 DIAGNOSIS — M79605 Pain in left leg: Secondary | ICD-10-CM | POA: Diagnosis not present

## 2019-07-12 DIAGNOSIS — Z5189 Encounter for other specified aftercare: Secondary | ICD-10-CM | POA: Diagnosis not present

## 2019-07-12 DIAGNOSIS — I10 Essential (primary) hypertension: Secondary | ICD-10-CM | POA: Diagnosis not present

## 2019-07-12 DIAGNOSIS — D72829 Elevated white blood cell count, unspecified: Secondary | ICD-10-CM | POA: Diagnosis not present

## 2019-07-12 DIAGNOSIS — Z780 Asymptomatic menopausal state: Secondary | ICD-10-CM | POA: Diagnosis not present

## 2019-07-12 DIAGNOSIS — I96 Gangrene, not elsewhere classified: Secondary | ICD-10-CM | POA: Diagnosis not present

## 2019-07-15 DIAGNOSIS — I739 Peripheral vascular disease, unspecified: Secondary | ICD-10-CM | POA: Diagnosis not present

## 2019-07-15 DIAGNOSIS — D649 Anemia, unspecified: Secondary | ICD-10-CM | POA: Diagnosis not present

## 2019-07-15 DIAGNOSIS — S88112D Complete traumatic amputation at level between knee and ankle, left lower leg, subsequent encounter: Secondary | ICD-10-CM | POA: Diagnosis not present

## 2019-07-15 DIAGNOSIS — I10 Essential (primary) hypertension: Secondary | ICD-10-CM | POA: Diagnosis not present

## 2019-07-15 DIAGNOSIS — E876 Hypokalemia: Secondary | ICD-10-CM | POA: Diagnosis not present

## 2019-07-26 DIAGNOSIS — I739 Peripheral vascular disease, unspecified: Secondary | ICD-10-CM | POA: Diagnosis not present

## 2019-07-26 DIAGNOSIS — I10 Essential (primary) hypertension: Secondary | ICD-10-CM | POA: Diagnosis not present

## 2019-07-26 DIAGNOSIS — R600 Localized edema: Secondary | ICD-10-CM | POA: Diagnosis not present

## 2019-07-26 DIAGNOSIS — D649 Anemia, unspecified: Secondary | ICD-10-CM | POA: Diagnosis not present

## 2019-07-27 DIAGNOSIS — E876 Hypokalemia: Secondary | ICD-10-CM | POA: Diagnosis not present

## 2019-07-27 DIAGNOSIS — I739 Peripheral vascular disease, unspecified: Secondary | ICD-10-CM | POA: Diagnosis not present

## 2019-07-27 DIAGNOSIS — J449 Chronic obstructive pulmonary disease, unspecified: Secondary | ICD-10-CM | POA: Diagnosis not present

## 2019-07-27 DIAGNOSIS — E559 Vitamin D deficiency, unspecified: Secondary | ICD-10-CM | POA: Diagnosis not present

## 2019-07-28 DIAGNOSIS — I70223 Atherosclerosis of native arteries of extremities with rest pain, bilateral legs: Secondary | ICD-10-CM | POA: Diagnosis not present

## 2019-07-28 DIAGNOSIS — I96 Gangrene, not elsewhere classified: Secondary | ICD-10-CM | POA: Diagnosis not present

## 2019-07-28 DIAGNOSIS — Z87891 Personal history of nicotine dependence: Secondary | ICD-10-CM | POA: Diagnosis not present

## 2019-07-28 DIAGNOSIS — I70213 Atherosclerosis of native arteries of extremities with intermittent claudication, bilateral legs: Secondary | ICD-10-CM | POA: Diagnosis not present

## 2019-07-30 IMAGING — RF DG ANG/EXT/UNI/OR LEFT
1 series · 6 of 6 positions shown · IV contrast (agent unspecified)
Comparison: None available

CLINICAL DATA: History of right iliofemoral endarterectomy

EXAM:
MIKALINA JULIA/EXT/UNI/ OR
CONTRAST:  See procedure report
FLUOROSCOPY TIME:  4 minutes 55 seconds

[Series 1: unknown protocol · 0.20mm/px · 6 of 6 slices shown]
[im 1/6]
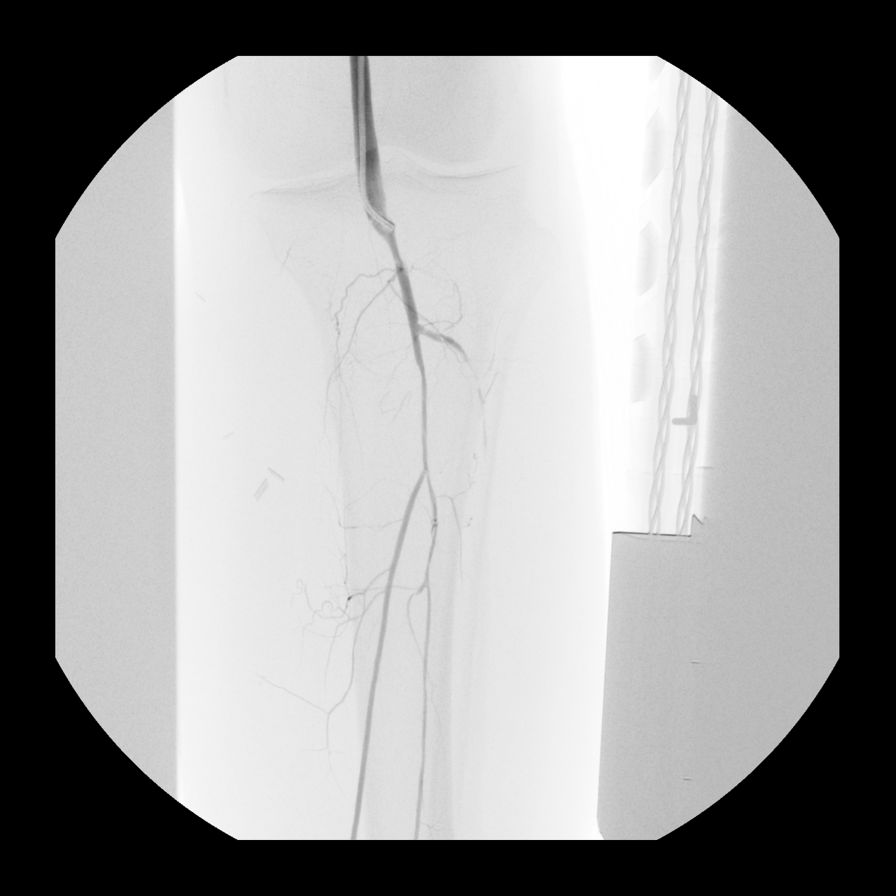
[im 2/6]
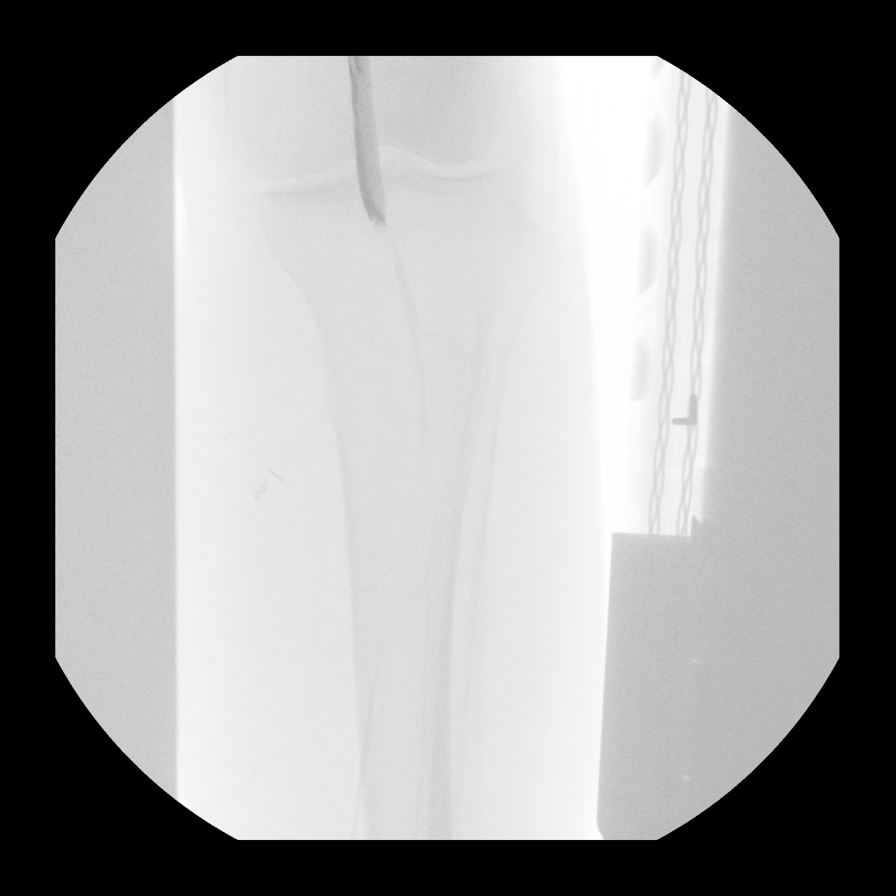
[im 3/6]
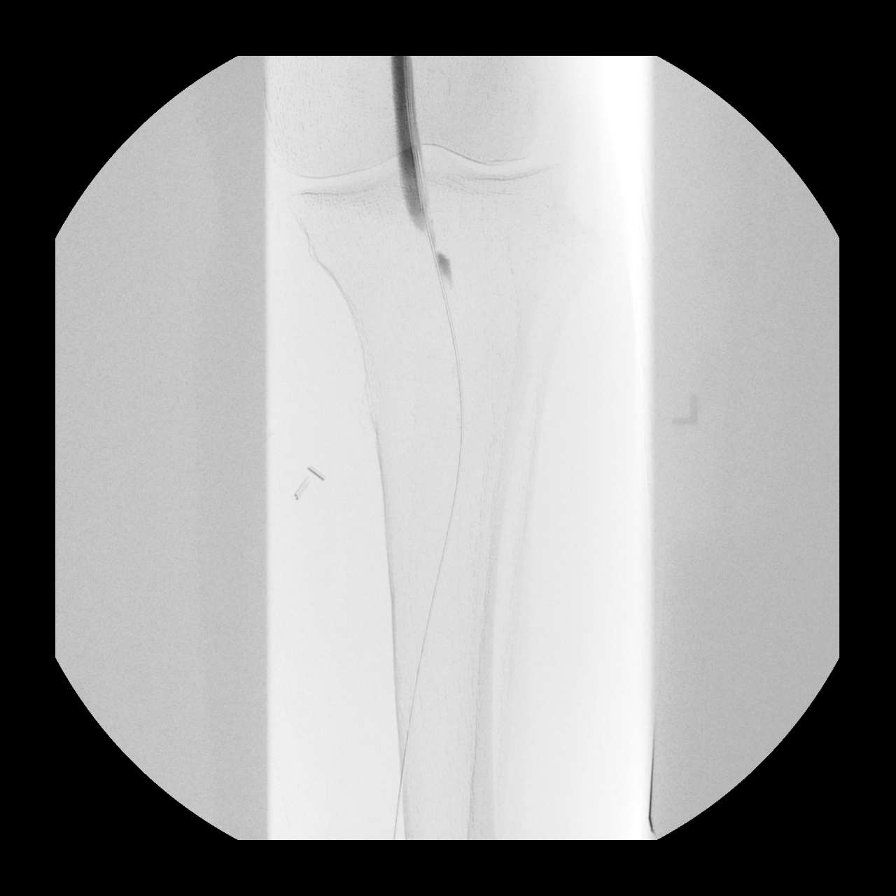
[im 4/6]
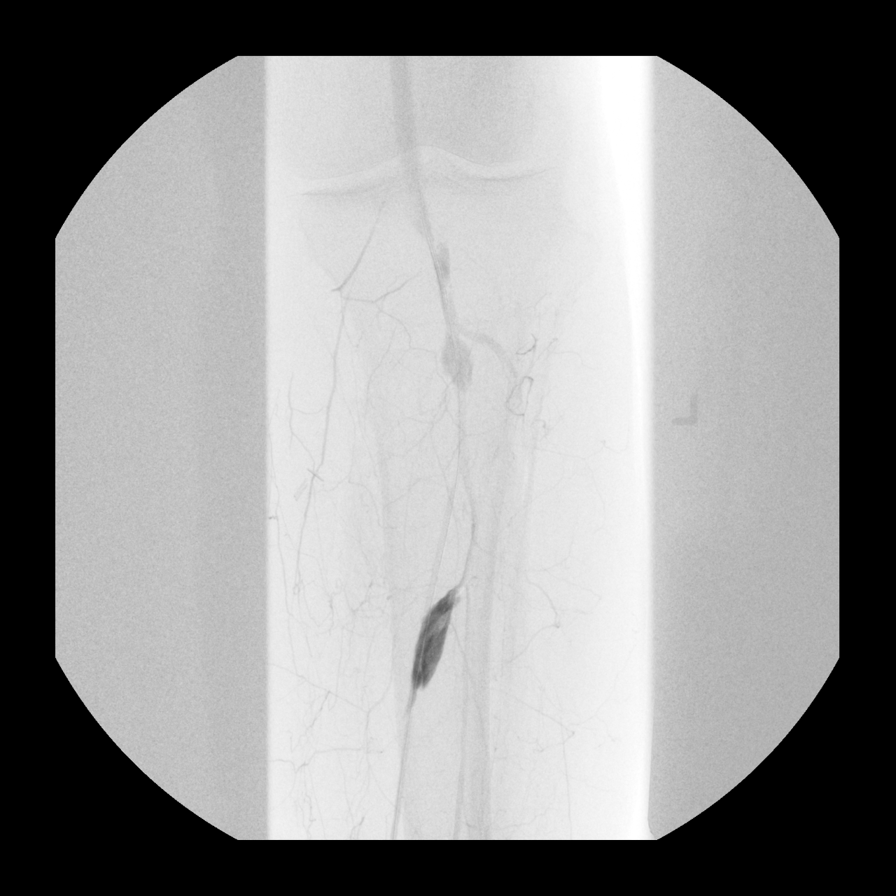
[im 5/6]
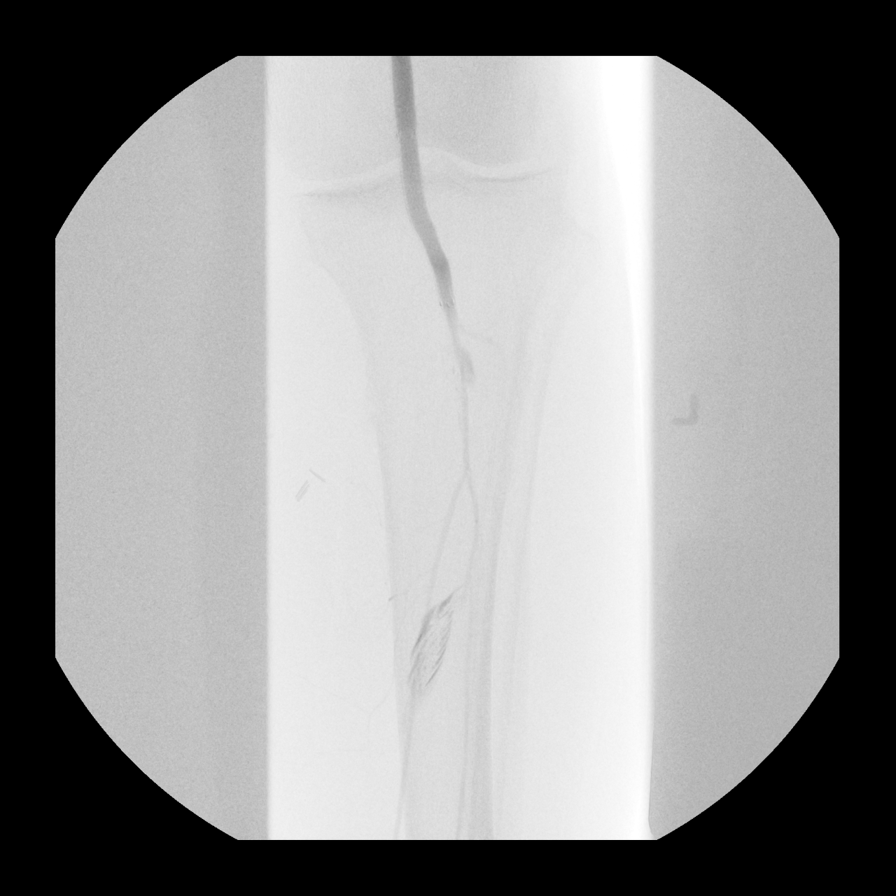
[im 6/6]
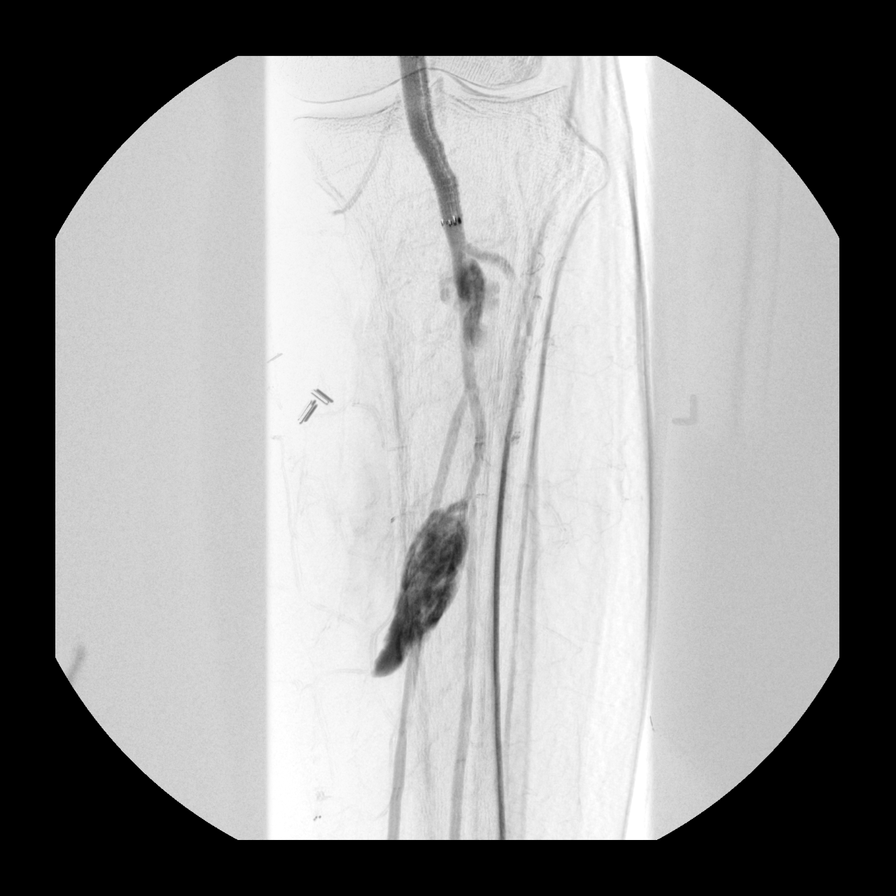

[6 of 6 positions shown; findings below may reference images not displayed]

FINDINGS: A series of intraoperative subtracted spot images document antegrade
catheter passage via graft to the anastomosis with the popliteal
artery below the knee. Stenosis just distal to the anastomosis.
Anterior tibial artery occludes in the proximal calf. Subsequent
images document foci of extravasation at the level of the
tibioperoneal trunk and proximal peroneal artery in the mid calf.
Stent placement across the distal anastomosis.
IMPRESSION: Left popliteal arterial intervention as above. See procedure report.

## 2019-08-03 DIAGNOSIS — I739 Peripheral vascular disease, unspecified: Secondary | ICD-10-CM | POA: Diagnosis not present

## 2019-08-03 DIAGNOSIS — E876 Hypokalemia: Secondary | ICD-10-CM | POA: Diagnosis not present

## 2019-08-03 DIAGNOSIS — I10 Essential (primary) hypertension: Secondary | ICD-10-CM | POA: Diagnosis not present

## 2019-08-03 DIAGNOSIS — J449 Chronic obstructive pulmonary disease, unspecified: Secondary | ICD-10-CM | POA: Diagnosis not present

## 2019-08-05 DIAGNOSIS — R630 Anorexia: Secondary | ICD-10-CM | POA: Diagnosis not present

## 2019-08-05 DIAGNOSIS — L03116 Cellulitis of left lower limb: Secondary | ICD-10-CM | POA: Diagnosis not present

## 2019-08-05 DIAGNOSIS — J449 Chronic obstructive pulmonary disease, unspecified: Secondary | ICD-10-CM | POA: Diagnosis not present

## 2019-08-05 DIAGNOSIS — I739 Peripheral vascular disease, unspecified: Secondary | ICD-10-CM | POA: Diagnosis not present

## 2019-08-11 DIAGNOSIS — I70262 Atherosclerosis of native arteries of extremities with gangrene, left leg: Secondary | ICD-10-CM | POA: Diagnosis not present

## 2019-08-18 DIAGNOSIS — I70213 Atherosclerosis of native arteries of extremities with intermittent claudication, bilateral legs: Secondary | ICD-10-CM | POA: Diagnosis not present

## 2019-08-18 DIAGNOSIS — Z780 Asymptomatic menopausal state: Secondary | ICD-10-CM | POA: Diagnosis not present

## 2019-08-18 DIAGNOSIS — I519 Heart disease, unspecified: Secondary | ICD-10-CM | POA: Diagnosis not present

## 2019-08-18 DIAGNOSIS — Z89512 Acquired absence of left leg below knee: Secondary | ICD-10-CM | POA: Diagnosis not present

## 2019-08-18 DIAGNOSIS — I10 Essential (primary) hypertension: Secondary | ICD-10-CM | POA: Diagnosis not present

## 2019-08-18 DIAGNOSIS — I70223 Atherosclerosis of native arteries of extremities with rest pain, bilateral legs: Secondary | ICD-10-CM | POA: Diagnosis not present

## 2019-08-18 DIAGNOSIS — Z87891 Personal history of nicotine dependence: Secondary | ICD-10-CM | POA: Diagnosis not present

## 2019-08-18 DIAGNOSIS — I70262 Atherosclerosis of native arteries of extremities with gangrene, left leg: Secondary | ICD-10-CM | POA: Diagnosis not present

## 2019-08-19 ENCOUNTER — Ambulatory Visit: Payer: Medicare Other | Admitting: Cardiology

## 2019-08-23 DIAGNOSIS — J449 Chronic obstructive pulmonary disease, unspecified: Secondary | ICD-10-CM | POA: Diagnosis not present

## 2019-08-23 DIAGNOSIS — I10 Essential (primary) hypertension: Secondary | ICD-10-CM | POA: Diagnosis not present

## 2019-08-23 DIAGNOSIS — I739 Peripheral vascular disease, unspecified: Secondary | ICD-10-CM | POA: Diagnosis not present

## 2019-08-23 DIAGNOSIS — G546 Phantom limb syndrome with pain: Secondary | ICD-10-CM | POA: Diagnosis not present

## 2019-08-25 ENCOUNTER — Ambulatory Visit: Payer: Medicare Other | Admitting: Cardiology

## 2019-09-01 DIAGNOSIS — I70213 Atherosclerosis of native arteries of extremities with intermittent claudication, bilateral legs: Secondary | ICD-10-CM | POA: Diagnosis not present

## 2019-09-01 DIAGNOSIS — I70223 Atherosclerosis of native arteries of extremities with rest pain, bilateral legs: Secondary | ICD-10-CM | POA: Diagnosis not present

## 2019-09-01 DIAGNOSIS — I10 Essential (primary) hypertension: Secondary | ICD-10-CM | POA: Diagnosis not present

## 2019-09-01 DIAGNOSIS — I96 Gangrene, not elsewhere classified: Secondary | ICD-10-CM | POA: Diagnosis not present

## 2019-09-01 DIAGNOSIS — Z87891 Personal history of nicotine dependence: Secondary | ICD-10-CM | POA: Diagnosis not present

## 2019-09-01 DIAGNOSIS — I70262 Atherosclerosis of native arteries of extremities with gangrene, left leg: Secondary | ICD-10-CM | POA: Diagnosis not present

## 2019-09-06 DIAGNOSIS — I10 Essential (primary) hypertension: Secondary | ICD-10-CM | POA: Diagnosis not present

## 2019-09-06 DIAGNOSIS — I739 Peripheral vascular disease, unspecified: Secondary | ICD-10-CM | POA: Diagnosis not present

## 2019-09-06 DIAGNOSIS — R630 Anorexia: Secondary | ICD-10-CM | POA: Diagnosis not present

## 2019-09-06 DIAGNOSIS — J449 Chronic obstructive pulmonary disease, unspecified: Secondary | ICD-10-CM | POA: Diagnosis not present

## 2019-09-08 ENCOUNTER — Ambulatory Visit: Payer: Medicare Other | Admitting: Internal Medicine

## 2019-09-23 DIAGNOSIS — I739 Peripheral vascular disease, unspecified: Secondary | ICD-10-CM | POA: Diagnosis not present

## 2019-09-23 DIAGNOSIS — J449 Chronic obstructive pulmonary disease, unspecified: Secondary | ICD-10-CM | POA: Diagnosis not present

## 2019-09-23 DIAGNOSIS — I10 Essential (primary) hypertension: Secondary | ICD-10-CM | POA: Diagnosis not present

## 2019-09-23 DIAGNOSIS — G546 Phantom limb syndrome with pain: Secondary | ICD-10-CM | POA: Diagnosis not present

## 2019-09-27 DIAGNOSIS — I10 Essential (primary) hypertension: Secondary | ICD-10-CM | POA: Diagnosis not present

## 2019-09-27 DIAGNOSIS — I739 Peripheral vascular disease, unspecified: Secondary | ICD-10-CM | POA: Diagnosis not present

## 2019-09-27 DIAGNOSIS — J449 Chronic obstructive pulmonary disease, unspecified: Secondary | ICD-10-CM | POA: Diagnosis not present

## 2019-09-27 DIAGNOSIS — R630 Anorexia: Secondary | ICD-10-CM | POA: Diagnosis not present

## 2019-09-28 ENCOUNTER — Ambulatory Visit: Payer: Medicare Other | Admitting: Internal Medicine

## 2019-09-28 DIAGNOSIS — J449 Chronic obstructive pulmonary disease, unspecified: Secondary | ICD-10-CM | POA: Diagnosis not present

## 2019-09-28 DIAGNOSIS — I10 Essential (primary) hypertension: Secondary | ICD-10-CM | POA: Diagnosis not present

## 2019-09-28 DIAGNOSIS — M79605 Pain in left leg: Secondary | ICD-10-CM | POA: Diagnosis not present

## 2019-09-28 DIAGNOSIS — I739 Peripheral vascular disease, unspecified: Secondary | ICD-10-CM | POA: Diagnosis not present

## 2019-09-29 DIAGNOSIS — I96 Gangrene, not elsewhere classified: Secondary | ICD-10-CM | POA: Diagnosis not present

## 2019-09-29 DIAGNOSIS — I70213 Atherosclerosis of native arteries of extremities with intermittent claudication, bilateral legs: Secondary | ICD-10-CM | POA: Diagnosis not present

## 2019-09-29 DIAGNOSIS — Z87891 Personal history of nicotine dependence: Secondary | ICD-10-CM | POA: Diagnosis not present

## 2019-09-29 DIAGNOSIS — I70223 Atherosclerosis of native arteries of extremities with rest pain, bilateral legs: Secondary | ICD-10-CM | POA: Diagnosis not present

## 2019-09-29 DIAGNOSIS — I70262 Atherosclerosis of native arteries of extremities with gangrene, left leg: Secondary | ICD-10-CM | POA: Diagnosis not present

## 2019-09-29 DIAGNOSIS — I10 Essential (primary) hypertension: Secondary | ICD-10-CM | POA: Diagnosis not present

## 2019-10-11 DIAGNOSIS — G894 Chronic pain syndrome: Secondary | ICD-10-CM | POA: Diagnosis not present

## 2019-10-11 DIAGNOSIS — R630 Anorexia: Secondary | ICD-10-CM | POA: Diagnosis not present

## 2019-10-11 DIAGNOSIS — I10 Essential (primary) hypertension: Secondary | ICD-10-CM | POA: Diagnosis not present

## 2019-10-11 DIAGNOSIS — R7989 Other specified abnormal findings of blood chemistry: Secondary | ICD-10-CM | POA: Diagnosis not present

## 2019-10-12 DIAGNOSIS — I10 Essential (primary) hypertension: Secondary | ICD-10-CM | POA: Diagnosis not present

## 2019-10-12 DIAGNOSIS — D649 Anemia, unspecified: Secondary | ICD-10-CM | POA: Diagnosis not present

## 2019-10-13 DIAGNOSIS — Z87891 Personal history of nicotine dependence: Secondary | ICD-10-CM | POA: Diagnosis not present

## 2019-10-13 DIAGNOSIS — I70223 Atherosclerosis of native arteries of extremities with rest pain, bilateral legs: Secondary | ICD-10-CM | POA: Diagnosis not present

## 2019-10-13 DIAGNOSIS — T8789 Other complications of amputation stump: Secondary | ICD-10-CM | POA: Diagnosis not present

## 2019-10-13 DIAGNOSIS — T8781 Dehiscence of amputation stump: Secondary | ICD-10-CM | POA: Diagnosis not present

## 2019-10-28 DIAGNOSIS — T8789 Other complications of amputation stump: Secondary | ICD-10-CM | POA: Diagnosis not present

## 2019-10-28 DIAGNOSIS — Z87891 Personal history of nicotine dependence: Secondary | ICD-10-CM | POA: Diagnosis not present

## 2019-10-28 DIAGNOSIS — I70262 Atherosclerosis of native arteries of extremities with gangrene, left leg: Secondary | ICD-10-CM | POA: Diagnosis not present

## 2019-10-28 DIAGNOSIS — I70223 Atherosclerosis of native arteries of extremities with rest pain, bilateral legs: Secondary | ICD-10-CM | POA: Diagnosis not present

## 2019-10-28 DIAGNOSIS — T8781 Dehiscence of amputation stump: Secondary | ICD-10-CM | POA: Diagnosis not present

## 2019-11-01 DIAGNOSIS — R630 Anorexia: Secondary | ICD-10-CM | POA: Diagnosis not present

## 2019-11-01 DIAGNOSIS — J449 Chronic obstructive pulmonary disease, unspecified: Secondary | ICD-10-CM | POA: Diagnosis not present

## 2019-11-01 DIAGNOSIS — I739 Peripheral vascular disease, unspecified: Secondary | ICD-10-CM | POA: Diagnosis not present

## 2019-11-01 DIAGNOSIS — I10 Essential (primary) hypertension: Secondary | ICD-10-CM | POA: Diagnosis not present

## 2019-11-10 DIAGNOSIS — T8781 Dehiscence of amputation stump: Secondary | ICD-10-CM | POA: Diagnosis not present

## 2019-11-10 DIAGNOSIS — I70262 Atherosclerosis of native arteries of extremities with gangrene, left leg: Secondary | ICD-10-CM | POA: Diagnosis not present

## 2019-11-10 DIAGNOSIS — I70223 Atherosclerosis of native arteries of extremities with rest pain, bilateral legs: Secondary | ICD-10-CM | POA: Diagnosis not present

## 2019-11-10 DIAGNOSIS — T8789 Other complications of amputation stump: Secondary | ICD-10-CM | POA: Diagnosis not present

## 2019-11-10 DIAGNOSIS — Z87891 Personal history of nicotine dependence: Secondary | ICD-10-CM | POA: Diagnosis not present

## 2019-12-15 DIAGNOSIS — Z23 Encounter for immunization: Secondary | ICD-10-CM | POA: Diagnosis not present

## 2019-12-21 ENCOUNTER — Telehealth: Payer: Self-pay | Admitting: Family Medicine

## 2019-12-21 NOTE — Telephone Encounter (Signed)
Left message for patient to call back and schedule Medicare Annual Wellness Visit (AWV) either virtually,audio only or in person (whichever the patient prefers--45 MINUTES).  Last AWV 1.13.20; please schedule 1.14.21 or after with [facility] at [facility] [place].  Ok for [facility] to schedule

## 2019-12-22 DIAGNOSIS — I10 Essential (primary) hypertension: Secondary | ICD-10-CM | POA: Diagnosis not present

## 2019-12-22 DIAGNOSIS — G894 Chronic pain syndrome: Secondary | ICD-10-CM | POA: Diagnosis not present

## 2019-12-22 DIAGNOSIS — J449 Chronic obstructive pulmonary disease, unspecified: Secondary | ICD-10-CM | POA: Diagnosis not present

## 2019-12-22 DIAGNOSIS — I739 Peripheral vascular disease, unspecified: Secondary | ICD-10-CM | POA: Diagnosis not present

## 2019-12-28 DIAGNOSIS — G894 Chronic pain syndrome: Secondary | ICD-10-CM | POA: Diagnosis not present

## 2019-12-28 DIAGNOSIS — J441 Chronic obstructive pulmonary disease with (acute) exacerbation: Secondary | ICD-10-CM | POA: Diagnosis not present

## 2019-12-28 DIAGNOSIS — I10 Essential (primary) hypertension: Secondary | ICD-10-CM | POA: Diagnosis not present

## 2019-12-28 DIAGNOSIS — I739 Peripheral vascular disease, unspecified: Secondary | ICD-10-CM | POA: Diagnosis not present

## 2020-01-05 DIAGNOSIS — Z23 Encounter for immunization: Secondary | ICD-10-CM | POA: Diagnosis not present

## 2020-01-19 DIAGNOSIS — T8789 Other complications of amputation stump: Secondary | ICD-10-CM | POA: Diagnosis not present

## 2020-01-19 DIAGNOSIS — T8781 Dehiscence of amputation stump: Secondary | ICD-10-CM | POA: Diagnosis not present

## 2020-01-19 DIAGNOSIS — I70262 Atherosclerosis of native arteries of extremities with gangrene, left leg: Secondary | ICD-10-CM | POA: Diagnosis not present

## 2020-01-19 DIAGNOSIS — R0989 Other specified symptoms and signs involving the circulatory and respiratory systems: Secondary | ICD-10-CM | POA: Diagnosis not present

## 2020-01-19 DIAGNOSIS — I70211 Atherosclerosis of native arteries of extremities with intermittent claudication, right leg: Secondary | ICD-10-CM | POA: Diagnosis not present

## 2020-01-19 DIAGNOSIS — Z87891 Personal history of nicotine dependence: Secondary | ICD-10-CM | POA: Diagnosis not present

## 2020-01-19 DIAGNOSIS — I70223 Atherosclerosis of native arteries of extremities with rest pain, bilateral legs: Secondary | ICD-10-CM | POA: Diagnosis not present

## 2020-01-20 DIAGNOSIS — Z7901 Long term (current) use of anticoagulants: Secondary | ICD-10-CM | POA: Diagnosis not present

## 2020-01-20 DIAGNOSIS — Z7902 Long term (current) use of antithrombotics/antiplatelets: Secondary | ICD-10-CM | POA: Diagnosis not present

## 2020-01-20 DIAGNOSIS — Z01812 Encounter for preprocedural laboratory examination: Secondary | ICD-10-CM | POA: Diagnosis not present

## 2020-01-20 DIAGNOSIS — I70211 Atherosclerosis of native arteries of extremities with intermittent claudication, right leg: Secondary | ICD-10-CM | POA: Diagnosis not present

## 2020-01-21 DIAGNOSIS — J449 Chronic obstructive pulmonary disease, unspecified: Secondary | ICD-10-CM | POA: Diagnosis not present

## 2020-01-21 DIAGNOSIS — Z87891 Personal history of nicotine dependence: Secondary | ICD-10-CM | POA: Diagnosis not present

## 2020-01-21 DIAGNOSIS — E785 Hyperlipidemia, unspecified: Secondary | ICD-10-CM | POA: Diagnosis not present

## 2020-01-21 DIAGNOSIS — I10 Essential (primary) hypertension: Secondary | ICD-10-CM | POA: Diagnosis not present

## 2020-01-21 DIAGNOSIS — Z9981 Dependence on supplemental oxygen: Secondary | ICD-10-CM | POA: Diagnosis not present

## 2020-01-21 DIAGNOSIS — I739 Peripheral vascular disease, unspecified: Secondary | ICD-10-CM | POA: Diagnosis not present

## 2020-01-21 DIAGNOSIS — I70221 Atherosclerosis of native arteries of extremities with rest pain, right leg: Secondary | ICD-10-CM | POA: Diagnosis not present

## 2020-01-21 DIAGNOSIS — I2510 Atherosclerotic heart disease of native coronary artery without angina pectoris: Secondary | ICD-10-CM | POA: Diagnosis not present

## 2020-02-02 DIAGNOSIS — I739 Peripheral vascular disease, unspecified: Secondary | ICD-10-CM | POA: Diagnosis not present

## 2020-02-02 DIAGNOSIS — R05 Cough: Secondary | ICD-10-CM | POA: Diagnosis not present

## 2020-02-02 DIAGNOSIS — I10 Essential (primary) hypertension: Secondary | ICD-10-CM | POA: Diagnosis not present

## 2020-02-02 DIAGNOSIS — J449 Chronic obstructive pulmonary disease, unspecified: Secondary | ICD-10-CM | POA: Diagnosis not present

## 2020-02-18 DIAGNOSIS — I739 Peripheral vascular disease, unspecified: Secondary | ICD-10-CM | POA: Diagnosis not present

## 2020-02-18 DIAGNOSIS — J449 Chronic obstructive pulmonary disease, unspecified: Secondary | ICD-10-CM | POA: Diagnosis not present

## 2020-02-18 DIAGNOSIS — K5900 Constipation, unspecified: Secondary | ICD-10-CM | POA: Diagnosis not present

## 2020-02-18 DIAGNOSIS — I10 Essential (primary) hypertension: Secondary | ICD-10-CM | POA: Diagnosis not present

## 2020-03-02 DIAGNOSIS — R0989 Other specified symptoms and signs involving the circulatory and respiratory systems: Secondary | ICD-10-CM | POA: Diagnosis not present

## 2020-03-02 DIAGNOSIS — I70211 Atherosclerosis of native arteries of extremities with intermittent claudication, right leg: Secondary | ICD-10-CM | POA: Diagnosis not present

## 2020-03-02 DIAGNOSIS — Z48812 Encounter for surgical aftercare following surgery on the circulatory system: Secondary | ICD-10-CM | POA: Diagnosis not present

## 2020-03-15 DIAGNOSIS — I739 Peripheral vascular disease, unspecified: Secondary | ICD-10-CM | POA: Diagnosis not present

## 2020-03-15 DIAGNOSIS — L03116 Cellulitis of left lower limb: Secondary | ICD-10-CM | POA: Diagnosis not present

## 2020-03-15 DIAGNOSIS — J449 Chronic obstructive pulmonary disease, unspecified: Secondary | ICD-10-CM | POA: Diagnosis not present

## 2020-03-15 DIAGNOSIS — I10 Essential (primary) hypertension: Secondary | ICD-10-CM | POA: Diagnosis not present

## 2020-03-17 DIAGNOSIS — T8789 Other complications of amputation stump: Secondary | ICD-10-CM | POA: Diagnosis not present

## 2020-03-17 DIAGNOSIS — I70223 Atherosclerosis of native arteries of extremities with rest pain, bilateral legs: Secondary | ICD-10-CM | POA: Diagnosis not present

## 2020-03-17 DIAGNOSIS — Z87891 Personal history of nicotine dependence: Secondary | ICD-10-CM | POA: Diagnosis not present

## 2020-03-29 DIAGNOSIS — I70211 Atherosclerosis of native arteries of extremities with intermittent claudication, right leg: Secondary | ICD-10-CM | POA: Diagnosis not present

## 2020-03-29 DIAGNOSIS — I739 Peripheral vascular disease, unspecified: Secondary | ICD-10-CM | POA: Diagnosis not present

## 2020-03-29 DIAGNOSIS — R0989 Other specified symptoms and signs involving the circulatory and respiratory systems: Secondary | ICD-10-CM | POA: Diagnosis not present

## 2020-03-29 DIAGNOSIS — T8789 Other complications of amputation stump: Secondary | ICD-10-CM | POA: Diagnosis not present

## 2020-03-29 DIAGNOSIS — R630 Anorexia: Secondary | ICD-10-CM | POA: Diagnosis not present

## 2020-03-29 DIAGNOSIS — I10 Essential (primary) hypertension: Secondary | ICD-10-CM | POA: Diagnosis not present

## 2020-03-29 DIAGNOSIS — T8781 Dehiscence of amputation stump: Secondary | ICD-10-CM | POA: Diagnosis not present

## 2020-03-29 DIAGNOSIS — I70262 Atherosclerosis of native arteries of extremities with gangrene, left leg: Secondary | ICD-10-CM | POA: Diagnosis not present

## 2020-03-29 DIAGNOSIS — J449 Chronic obstructive pulmonary disease, unspecified: Secondary | ICD-10-CM | POA: Diagnosis not present

## 2020-03-29 DIAGNOSIS — I70223 Atherosclerosis of native arteries of extremities with rest pain, bilateral legs: Secondary | ICD-10-CM | POA: Diagnosis not present

## 2020-03-29 DIAGNOSIS — Z48812 Encounter for surgical aftercare following surgery on the circulatory system: Secondary | ICD-10-CM | POA: Diagnosis not present

## 2020-03-29 DIAGNOSIS — Z87891 Personal history of nicotine dependence: Secondary | ICD-10-CM | POA: Diagnosis not present

## 2020-03-30 DIAGNOSIS — D649 Anemia, unspecified: Secondary | ICD-10-CM | POA: Diagnosis not present

## 2020-03-30 DIAGNOSIS — E162 Hypoglycemia, unspecified: Secondary | ICD-10-CM | POA: Diagnosis not present

## 2020-04-11 DIAGNOSIS — M79674 Pain in right toe(s): Secondary | ICD-10-CM | POA: Diagnosis not present

## 2020-04-11 DIAGNOSIS — B351 Tinea unguium: Secondary | ICD-10-CM | POA: Diagnosis not present

## 2020-04-11 DIAGNOSIS — I70203 Unspecified atherosclerosis of native arteries of extremities, bilateral legs: Secondary | ICD-10-CM | POA: Diagnosis not present

## 2020-04-11 DIAGNOSIS — L853 Xerosis cutis: Secondary | ICD-10-CM | POA: Diagnosis not present

## 2020-04-26 DIAGNOSIS — Z87891 Personal history of nicotine dependence: Secondary | ICD-10-CM | POA: Diagnosis not present

## 2020-04-26 DIAGNOSIS — I70223 Atherosclerosis of native arteries of extremities with rest pain, bilateral legs: Secondary | ICD-10-CM | POA: Diagnosis not present

## 2020-04-26 DIAGNOSIS — I83891 Varicose veins of right lower extremities with other complications: Secondary | ICD-10-CM | POA: Diagnosis not present

## 2020-04-26 DIAGNOSIS — R600 Localized edema: Secondary | ICD-10-CM | POA: Diagnosis not present

## 2020-05-19 DIAGNOSIS — I10 Essential (primary) hypertension: Secondary | ICD-10-CM | POA: Diagnosis not present

## 2020-05-19 DIAGNOSIS — I739 Peripheral vascular disease, unspecified: Secondary | ICD-10-CM | POA: Diagnosis not present

## 2020-05-19 DIAGNOSIS — J449 Chronic obstructive pulmonary disease, unspecified: Secondary | ICD-10-CM | POA: Diagnosis not present

## 2020-05-19 DIAGNOSIS — R262 Difficulty in walking, not elsewhere classified: Secondary | ICD-10-CM | POA: Diagnosis not present

## 2020-06-09 DIAGNOSIS — R531 Weakness: Secondary | ICD-10-CM | POA: Diagnosis not present

## 2020-06-12 DIAGNOSIS — R531 Weakness: Secondary | ICD-10-CM | POA: Diagnosis not present

## 2020-06-14 DIAGNOSIS — R531 Weakness: Secondary | ICD-10-CM | POA: Diagnosis not present

## 2020-06-16 DIAGNOSIS — R531 Weakness: Secondary | ICD-10-CM | POA: Diagnosis not present

## 2020-06-21 DIAGNOSIS — I2510 Atherosclerotic heart disease of native coronary artery without angina pectoris: Secondary | ICD-10-CM | POA: Diagnosis not present

## 2020-06-21 DIAGNOSIS — J449 Chronic obstructive pulmonary disease, unspecified: Secondary | ICD-10-CM | POA: Diagnosis not present

## 2020-06-21 DIAGNOSIS — I739 Peripheral vascular disease, unspecified: Secondary | ICD-10-CM | POA: Diagnosis not present

## 2020-06-21 DIAGNOSIS — I10 Essential (primary) hypertension: Secondary | ICD-10-CM | POA: Diagnosis not present

## 2020-06-22 DIAGNOSIS — R531 Weakness: Secondary | ICD-10-CM | POA: Diagnosis not present

## 2020-06-22 DIAGNOSIS — R895 Abnormal microbiological findings in specimens from other organs, systems and tissues: Secondary | ICD-10-CM | POA: Diagnosis not present

## 2020-06-23 DIAGNOSIS — E559 Vitamin D deficiency, unspecified: Secondary | ICD-10-CM | POA: Diagnosis not present

## 2020-06-23 DIAGNOSIS — E785 Hyperlipidemia, unspecified: Secondary | ICD-10-CM | POA: Diagnosis not present

## 2020-06-23 DIAGNOSIS — I739 Peripheral vascular disease, unspecified: Secondary | ICD-10-CM | POA: Diagnosis not present

## 2020-06-23 DIAGNOSIS — I998 Other disorder of circulatory system: Secondary | ICD-10-CM | POA: Diagnosis not present

## 2020-06-23 DIAGNOSIS — R531 Weakness: Secondary | ICD-10-CM | POA: Diagnosis not present

## 2020-06-26 DIAGNOSIS — R531 Weakness: Secondary | ICD-10-CM | POA: Diagnosis not present

## 2020-06-27 DIAGNOSIS — I739 Peripheral vascular disease, unspecified: Secondary | ICD-10-CM | POA: Diagnosis not present

## 2020-06-27 DIAGNOSIS — I2510 Atherosclerotic heart disease of native coronary artery without angina pectoris: Secondary | ICD-10-CM | POA: Diagnosis not present

## 2020-06-27 DIAGNOSIS — J449 Chronic obstructive pulmonary disease, unspecified: Secondary | ICD-10-CM | POA: Diagnosis not present

## 2020-06-27 DIAGNOSIS — L03116 Cellulitis of left lower limb: Secondary | ICD-10-CM | POA: Diagnosis not present

## 2020-06-28 DIAGNOSIS — R531 Weakness: Secondary | ICD-10-CM | POA: Diagnosis not present

## 2020-06-30 DIAGNOSIS — R531 Weakness: Secondary | ICD-10-CM | POA: Diagnosis not present

## 2020-07-03 DIAGNOSIS — I2510 Atherosclerotic heart disease of native coronary artery without angina pectoris: Secondary | ICD-10-CM | POA: Diagnosis not present

## 2020-07-03 DIAGNOSIS — I739 Peripheral vascular disease, unspecified: Secondary | ICD-10-CM | POA: Diagnosis not present

## 2020-07-03 DIAGNOSIS — R531 Weakness: Secondary | ICD-10-CM | POA: Diagnosis not present

## 2020-07-03 DIAGNOSIS — I10 Essential (primary) hypertension: Secondary | ICD-10-CM | POA: Diagnosis not present

## 2020-07-03 DIAGNOSIS — J449 Chronic obstructive pulmonary disease, unspecified: Secondary | ICD-10-CM | POA: Diagnosis not present

## 2020-07-04 DIAGNOSIS — B351 Tinea unguium: Secondary | ICD-10-CM | POA: Diagnosis not present

## 2020-07-04 DIAGNOSIS — M79674 Pain in right toe(s): Secondary | ICD-10-CM | POA: Diagnosis not present

## 2020-07-04 DIAGNOSIS — L853 Xerosis cutis: Secondary | ICD-10-CM | POA: Diagnosis not present

## 2020-07-04 DIAGNOSIS — I70203 Unspecified atherosclerosis of native arteries of extremities, bilateral legs: Secondary | ICD-10-CM | POA: Diagnosis not present

## 2020-07-05 DIAGNOSIS — R531 Weakness: Secondary | ICD-10-CM | POA: Diagnosis not present

## 2020-07-07 DIAGNOSIS — R531 Weakness: Secondary | ICD-10-CM | POA: Diagnosis not present

## 2020-07-13 DIAGNOSIS — R062 Wheezing: Secondary | ICD-10-CM | POA: Diagnosis not present

## 2020-07-13 DIAGNOSIS — I10 Essential (primary) hypertension: Secondary | ICD-10-CM | POA: Diagnosis not present

## 2020-07-13 DIAGNOSIS — I2510 Atherosclerotic heart disease of native coronary artery without angina pectoris: Secondary | ICD-10-CM | POA: Diagnosis not present

## 2020-07-13 DIAGNOSIS — J449 Chronic obstructive pulmonary disease, unspecified: Secondary | ICD-10-CM | POA: Diagnosis not present

## 2020-08-10 DIAGNOSIS — I739 Peripheral vascular disease, unspecified: Secondary | ICD-10-CM | POA: Diagnosis not present

## 2020-08-10 DIAGNOSIS — J441 Chronic obstructive pulmonary disease with (acute) exacerbation: Secondary | ICD-10-CM | POA: Diagnosis not present

## 2020-08-10 DIAGNOSIS — R05 Cough: Secondary | ICD-10-CM | POA: Diagnosis not present

## 2020-08-10 DIAGNOSIS — I2510 Atherosclerotic heart disease of native coronary artery without angina pectoris: Secondary | ICD-10-CM | POA: Diagnosis not present

## 2020-08-11 DIAGNOSIS — R05 Cough: Secondary | ICD-10-CM | POA: Diagnosis not present

## 2020-08-21 DIAGNOSIS — Z89512 Acquired absence of left leg below knee: Secondary | ICD-10-CM | POA: Diagnosis not present

## 2020-08-21 DIAGNOSIS — L905 Scar conditions and fibrosis of skin: Secondary | ICD-10-CM | POA: Diagnosis not present

## 2020-08-21 DIAGNOSIS — Z87891 Personal history of nicotine dependence: Secondary | ICD-10-CM | POA: Diagnosis not present

## 2020-08-21 DIAGNOSIS — I70213 Atherosclerosis of native arteries of extremities with intermittent claudication, bilateral legs: Secondary | ICD-10-CM | POA: Diagnosis not present

## 2020-09-07 DIAGNOSIS — I70222 Atherosclerosis of native arteries of extremities with rest pain, left leg: Secondary | ICD-10-CM | POA: Diagnosis not present

## 2020-09-07 DIAGNOSIS — Z79899 Other long term (current) drug therapy: Secondary | ICD-10-CM | POA: Diagnosis not present

## 2020-09-07 DIAGNOSIS — I10 Essential (primary) hypertension: Secondary | ICD-10-CM | POA: Diagnosis not present

## 2020-09-07 DIAGNOSIS — Z7951 Long term (current) use of inhaled steroids: Secondary | ICD-10-CM | POA: Diagnosis not present

## 2020-09-07 DIAGNOSIS — Z86718 Personal history of other venous thrombosis and embolism: Secondary | ICD-10-CM | POA: Diagnosis not present

## 2020-09-07 DIAGNOSIS — E785 Hyperlipidemia, unspecified: Secondary | ICD-10-CM | POA: Diagnosis not present

## 2020-09-07 DIAGNOSIS — Z7982 Long term (current) use of aspirin: Secondary | ICD-10-CM | POA: Diagnosis not present

## 2020-09-07 DIAGNOSIS — Z87891 Personal history of nicotine dependence: Secondary | ICD-10-CM | POA: Diagnosis not present

## 2020-09-07 DIAGNOSIS — J449 Chronic obstructive pulmonary disease, unspecified: Secondary | ICD-10-CM | POA: Diagnosis not present

## 2020-09-07 DIAGNOSIS — L905 Scar conditions and fibrosis of skin: Secondary | ICD-10-CM | POA: Diagnosis not present

## 2020-09-07 DIAGNOSIS — I2510 Atherosclerotic heart disease of native coronary artery without angina pectoris: Secondary | ICD-10-CM | POA: Diagnosis not present

## 2020-09-07 DIAGNOSIS — Z7901 Long term (current) use of anticoagulants: Secondary | ICD-10-CM | POA: Diagnosis not present

## 2020-09-07 DIAGNOSIS — Z20822 Contact with and (suspected) exposure to covid-19: Secondary | ICD-10-CM | POA: Diagnosis not present

## 2020-09-07 DIAGNOSIS — T8789 Other complications of amputation stump: Secondary | ICD-10-CM | POA: Diagnosis not present

## 2020-09-08 DIAGNOSIS — I70203 Unspecified atherosclerosis of native arteries of extremities, bilateral legs: Secondary | ICD-10-CM | POA: Diagnosis not present

## 2020-09-08 DIAGNOSIS — Z86718 Personal history of other venous thrombosis and embolism: Secondary | ICD-10-CM | POA: Diagnosis not present

## 2020-09-08 DIAGNOSIS — E559 Vitamin D deficiency, unspecified: Secondary | ICD-10-CM | POA: Diagnosis not present

## 2020-09-08 DIAGNOSIS — E785 Hyperlipidemia, unspecified: Secondary | ICD-10-CM | POA: Diagnosis not present

## 2020-09-08 DIAGNOSIS — R062 Wheezing: Secondary | ICD-10-CM | POA: Diagnosis not present

## 2020-09-08 DIAGNOSIS — R2241 Localized swelling, mass and lump, right lower limb: Secondary | ICD-10-CM | POA: Diagnosis not present

## 2020-09-08 DIAGNOSIS — I83891 Varicose veins of right lower extremities with other complications: Secondary | ICD-10-CM | POA: Diagnosis not present

## 2020-09-08 DIAGNOSIS — D649 Anemia, unspecified: Secondary | ICD-10-CM | POA: Diagnosis not present

## 2020-09-08 DIAGNOSIS — I70223 Atherosclerosis of native arteries of extremities with rest pain, bilateral legs: Secondary | ICD-10-CM | POA: Diagnosis not present

## 2020-09-08 DIAGNOSIS — I2510 Atherosclerotic heart disease of native coronary artery without angina pectoris: Secondary | ICD-10-CM | POA: Diagnosis not present

## 2020-09-08 DIAGNOSIS — M79674 Pain in right toe(s): Secondary | ICD-10-CM | POA: Diagnosis not present

## 2020-09-08 DIAGNOSIS — Z87891 Personal history of nicotine dependence: Secondary | ICD-10-CM | POA: Diagnosis not present

## 2020-09-08 DIAGNOSIS — L905 Scar conditions and fibrosis of skin: Secondary | ICD-10-CM | POA: Diagnosis not present

## 2020-09-08 DIAGNOSIS — I70213 Atherosclerosis of native arteries of extremities with intermittent claudication, bilateral legs: Secondary | ICD-10-CM | POA: Diagnosis not present

## 2020-09-08 DIAGNOSIS — T8744 Infection of amputation stump, left lower extremity: Secondary | ICD-10-CM | POA: Diagnosis not present

## 2020-09-08 DIAGNOSIS — R059 Cough, unspecified: Secondary | ICD-10-CM | POA: Diagnosis not present

## 2020-09-08 DIAGNOSIS — J449 Chronic obstructive pulmonary disease, unspecified: Secondary | ICD-10-CM | POA: Diagnosis not present

## 2020-09-08 DIAGNOSIS — E46 Unspecified protein-calorie malnutrition: Secondary | ICD-10-CM | POA: Diagnosis not present

## 2020-09-08 DIAGNOSIS — D696 Thrombocytopenia, unspecified: Secondary | ICD-10-CM | POA: Diagnosis not present

## 2020-09-08 DIAGNOSIS — R531 Weakness: Secondary | ICD-10-CM | POA: Diagnosis not present

## 2020-09-08 DIAGNOSIS — I739 Peripheral vascular disease, unspecified: Secondary | ICD-10-CM | POA: Diagnosis not present

## 2020-09-08 DIAGNOSIS — R630 Anorexia: Secondary | ICD-10-CM | POA: Diagnosis not present

## 2020-09-08 DIAGNOSIS — I10 Essential (primary) hypertension: Secondary | ICD-10-CM | POA: Diagnosis not present

## 2020-09-08 DIAGNOSIS — B351 Tinea unguium: Secondary | ICD-10-CM | POA: Diagnosis not present

## 2020-09-08 DIAGNOSIS — K30 Functional dyspepsia: Secondary | ICD-10-CM | POA: Diagnosis not present

## 2020-09-08 DIAGNOSIS — I998 Other disorder of circulatory system: Secondary | ICD-10-CM | POA: Diagnosis not present

## 2020-09-08 DIAGNOSIS — F419 Anxiety disorder, unspecified: Secondary | ICD-10-CM | POA: Diagnosis not present

## 2020-09-08 DIAGNOSIS — M6281 Muscle weakness (generalized): Secondary | ICD-10-CM | POA: Diagnosis not present

## 2020-09-08 DIAGNOSIS — Z48812 Encounter for surgical aftercare following surgery on the circulatory system: Secondary | ICD-10-CM | POA: Diagnosis not present

## 2020-09-08 DIAGNOSIS — Z5189 Encounter for other specified aftercare: Secondary | ICD-10-CM | POA: Diagnosis not present

## 2020-09-08 DIAGNOSIS — Z23 Encounter for immunization: Secondary | ICD-10-CM | POA: Diagnosis not present

## 2020-09-08 DIAGNOSIS — G894 Chronic pain syndrome: Secondary | ICD-10-CM | POA: Diagnosis not present

## 2020-09-08 DIAGNOSIS — Z4789 Encounter for other orthopedic aftercare: Secondary | ICD-10-CM | POA: Diagnosis not present

## 2020-09-08 DIAGNOSIS — G546 Phantom limb syndrome with pain: Secondary | ICD-10-CM | POA: Diagnosis not present

## 2020-09-08 DIAGNOSIS — Z89512 Acquired absence of left leg below knee: Secondary | ICD-10-CM | POA: Diagnosis not present

## 2020-09-08 DIAGNOSIS — E876 Hypokalemia: Secondary | ICD-10-CM | POA: Diagnosis not present

## 2020-09-08 DIAGNOSIS — J441 Chronic obstructive pulmonary disease with (acute) exacerbation: Secondary | ICD-10-CM | POA: Diagnosis not present

## 2020-09-08 DIAGNOSIS — R262 Difficulty in walking, not elsewhere classified: Secondary | ICD-10-CM | POA: Diagnosis not present

## 2020-09-08 DIAGNOSIS — L03116 Cellulitis of left lower limb: Secondary | ICD-10-CM | POA: Diagnosis not present

## 2020-09-08 DIAGNOSIS — M1990 Unspecified osteoarthritis, unspecified site: Secondary | ICD-10-CM | POA: Diagnosis not present

## 2020-09-08 DIAGNOSIS — R600 Localized edema: Secondary | ICD-10-CM | POA: Diagnosis not present

## 2020-09-11 DIAGNOSIS — Z89512 Acquired absence of left leg below knee: Secondary | ICD-10-CM | POA: Diagnosis not present

## 2020-09-11 DIAGNOSIS — I739 Peripheral vascular disease, unspecified: Secondary | ICD-10-CM | POA: Diagnosis not present

## 2020-09-11 DIAGNOSIS — G546 Phantom limb syndrome with pain: Secondary | ICD-10-CM | POA: Diagnosis not present

## 2020-09-14 DIAGNOSIS — R062 Wheezing: Secondary | ICD-10-CM | POA: Diagnosis not present

## 2020-09-14 DIAGNOSIS — R059 Cough, unspecified: Secondary | ICD-10-CM | POA: Diagnosis not present

## 2020-09-14 DIAGNOSIS — J449 Chronic obstructive pulmonary disease, unspecified: Secondary | ICD-10-CM | POA: Diagnosis not present

## 2020-09-14 DIAGNOSIS — I739 Peripheral vascular disease, unspecified: Secondary | ICD-10-CM | POA: Diagnosis not present

## 2020-09-21 DIAGNOSIS — I70213 Atherosclerosis of native arteries of extremities with intermittent claudication, bilateral legs: Secondary | ICD-10-CM | POA: Diagnosis not present

## 2020-09-21 DIAGNOSIS — R2241 Localized swelling, mass and lump, right lower limb: Secondary | ICD-10-CM | POA: Diagnosis not present

## 2020-09-21 DIAGNOSIS — I83891 Varicose veins of right lower extremities with other complications: Secondary | ICD-10-CM | POA: Diagnosis not present

## 2020-09-21 DIAGNOSIS — I70223 Atherosclerosis of native arteries of extremities with rest pain, bilateral legs: Secondary | ICD-10-CM | POA: Diagnosis not present

## 2020-09-21 DIAGNOSIS — L905 Scar conditions and fibrosis of skin: Secondary | ICD-10-CM | POA: Diagnosis not present

## 2020-09-21 DIAGNOSIS — Z89512 Acquired absence of left leg below knee: Secondary | ICD-10-CM | POA: Diagnosis not present

## 2020-09-21 DIAGNOSIS — R600 Localized edema: Secondary | ICD-10-CM | POA: Diagnosis not present

## 2020-09-21 DIAGNOSIS — Z87891 Personal history of nicotine dependence: Secondary | ICD-10-CM | POA: Diagnosis not present

## 2020-09-27 DIAGNOSIS — I739 Peripheral vascular disease, unspecified: Secondary | ICD-10-CM | POA: Diagnosis not present

## 2020-09-27 DIAGNOSIS — J441 Chronic obstructive pulmonary disease with (acute) exacerbation: Secondary | ICD-10-CM | POA: Diagnosis not present

## 2020-09-27 DIAGNOSIS — I10 Essential (primary) hypertension: Secondary | ICD-10-CM | POA: Diagnosis not present

## 2020-09-27 DIAGNOSIS — T8744 Infection of amputation stump, left lower extremity: Secondary | ICD-10-CM | POA: Diagnosis not present

## 2020-10-04 DIAGNOSIS — L03116 Cellulitis of left lower limb: Secondary | ICD-10-CM | POA: Diagnosis not present

## 2020-10-04 DIAGNOSIS — K30 Functional dyspepsia: Secondary | ICD-10-CM | POA: Diagnosis not present

## 2020-10-04 DIAGNOSIS — J449 Chronic obstructive pulmonary disease, unspecified: Secondary | ICD-10-CM | POA: Diagnosis not present

## 2020-10-04 DIAGNOSIS — I2510 Atherosclerotic heart disease of native coronary artery without angina pectoris: Secondary | ICD-10-CM | POA: Diagnosis not present

## 2020-10-12 DIAGNOSIS — T8149XA Infection following a procedure, other surgical site, initial encounter: Secondary | ICD-10-CM | POA: Diagnosis not present

## 2020-10-12 DIAGNOSIS — Z48812 Encounter for surgical aftercare following surgery on the circulatory system: Secondary | ICD-10-CM | POA: Diagnosis not present

## 2020-10-12 DIAGNOSIS — I70213 Atherosclerosis of native arteries of extremities with intermittent claudication, bilateral legs: Secondary | ICD-10-CM | POA: Diagnosis not present

## 2020-10-12 DIAGNOSIS — Z87891 Personal history of nicotine dependence: Secondary | ICD-10-CM | POA: Diagnosis not present

## 2020-10-12 DIAGNOSIS — Z89512 Acquired absence of left leg below knee: Secondary | ICD-10-CM | POA: Diagnosis not present

## 2020-10-26 DIAGNOSIS — Z87891 Personal history of nicotine dependence: Secondary | ICD-10-CM | POA: Diagnosis not present

## 2020-10-26 DIAGNOSIS — I70213 Atherosclerosis of native arteries of extremities with intermittent claudication, bilateral legs: Secondary | ICD-10-CM | POA: Diagnosis not present

## 2020-10-26 DIAGNOSIS — Z89512 Acquired absence of left leg below knee: Secondary | ICD-10-CM | POA: Diagnosis not present

## 2020-10-26 DIAGNOSIS — T8149XA Infection following a procedure, other surgical site, initial encounter: Secondary | ICD-10-CM | POA: Diagnosis not present

## 2020-10-26 DIAGNOSIS — Z48812 Encounter for surgical aftercare following surgery on the circulatory system: Secondary | ICD-10-CM | POA: Diagnosis not present

## 2020-10-27 DIAGNOSIS — L03116 Cellulitis of left lower limb: Secondary | ICD-10-CM | POA: Diagnosis not present

## 2020-10-27 DIAGNOSIS — I10 Essential (primary) hypertension: Secondary | ICD-10-CM | POA: Diagnosis not present

## 2020-10-27 DIAGNOSIS — J449 Chronic obstructive pulmonary disease, unspecified: Secondary | ICD-10-CM | POA: Diagnosis not present

## 2020-10-27 DIAGNOSIS — R0602 Shortness of breath: Secondary | ICD-10-CM | POA: Diagnosis not present

## 2020-10-27 DIAGNOSIS — R911 Solitary pulmonary nodule: Secondary | ICD-10-CM | POA: Diagnosis not present

## 2020-11-06 DIAGNOSIS — J449 Chronic obstructive pulmonary disease, unspecified: Secondary | ICD-10-CM | POA: Diagnosis not present

## 2020-11-09 DIAGNOSIS — Z87891 Personal history of nicotine dependence: Secondary | ICD-10-CM | POA: Diagnosis not present

## 2020-11-09 DIAGNOSIS — Z48812 Encounter for surgical aftercare following surgery on the circulatory system: Secondary | ICD-10-CM | POA: Diagnosis not present

## 2020-11-09 DIAGNOSIS — I70223 Atherosclerosis of native arteries of extremities with rest pain, bilateral legs: Secondary | ICD-10-CM | POA: Diagnosis not present

## 2020-11-09 DIAGNOSIS — Z89512 Acquired absence of left leg below knee: Secondary | ICD-10-CM | POA: Diagnosis not present

## 2020-11-28 DIAGNOSIS — I10 Essential (primary) hypertension: Secondary | ICD-10-CM | POA: Diagnosis not present

## 2020-11-28 DIAGNOSIS — I739 Peripheral vascular disease, unspecified: Secondary | ICD-10-CM | POA: Diagnosis not present

## 2020-11-28 DIAGNOSIS — K219 Gastro-esophageal reflux disease without esophagitis: Secondary | ICD-10-CM | POA: Diagnosis not present

## 2020-11-28 DIAGNOSIS — J449 Chronic obstructive pulmonary disease, unspecified: Secondary | ICD-10-CM | POA: Diagnosis not present

## 2024-07-09 DEATH — deceased
